# Patient Record
Sex: Female | Born: 1945
Health system: Southern US, Community
[De-identification: ages and names within clinical notes are randomized; demographics above are authoritative.]

## PROBLEM LIST (undated history)

## (undated) DIAGNOSIS — E785 Hyperlipidemia, unspecified: Secondary | ICD-10-CM

## (undated) DIAGNOSIS — I639 Cerebral infarction, unspecified: Secondary | ICD-10-CM

## (undated) DIAGNOSIS — E119 Type 2 diabetes mellitus without complications: Secondary | ICD-10-CM

## (undated) DIAGNOSIS — I10 Essential (primary) hypertension: Secondary | ICD-10-CM

## (undated) DIAGNOSIS — F419 Anxiety disorder, unspecified: Secondary | ICD-10-CM

## (undated) HISTORY — DX: Essential (primary) hypertension: I10

## (undated) HISTORY — DX: Hyperlipidemia, unspecified: E78.5

## (undated) HISTORY — DX: Cerebral infarction, unspecified: I63.9

## (undated) HISTORY — DX: Anxiety disorder, unspecified: F41.9

## (undated) HISTORY — DX: Type 2 diabetes mellitus without complications: E11.9

## (undated) HISTORY — PX: CAROTID STENT: SHX1301

## (undated) HISTORY — PX: CAROTID ENDARTERECTOMY: SUR193

---

## 1999-01-16 ENCOUNTER — Other Ambulatory Visit: Admission: RE | Admit: 1999-01-16 | Discharge: 1999-01-16 | Payer: Self-pay

## 2000-08-17 ENCOUNTER — Other Ambulatory Visit: Admission: RE | Admit: 2000-08-17 | Discharge: 2000-08-17 | Payer: Self-pay | Admitting: Family Medicine

## 2011-11-03 ENCOUNTER — Other Ambulatory Visit: Payer: Self-pay | Admitting: Geriatric Medicine

## 2011-11-03 DIAGNOSIS — R109 Unspecified abdominal pain: Secondary | ICD-10-CM

## 2011-11-05 ENCOUNTER — Other Ambulatory Visit: Payer: Self-pay

## 2013-01-26 ENCOUNTER — Encounter: Payer: Self-pay | Admitting: Nurse Practitioner

## 2013-01-26 ENCOUNTER — Ambulatory Visit (INDEPENDENT_AMBULATORY_CARE_PROVIDER_SITE_OTHER): Payer: BC Managed Care – PPO | Admitting: Nurse Practitioner

## 2013-01-26 VITALS — BP 122/84 | HR 89 | Temp 98.2°F | Ht 61.5 in | Wt 165.0 lb

## 2013-01-26 DIAGNOSIS — IMO0002 Reserved for concepts with insufficient information to code with codable children: Secondary | ICD-10-CM

## 2013-01-26 DIAGNOSIS — E119 Type 2 diabetes mellitus without complications: Secondary | ICD-10-CM

## 2013-01-26 DIAGNOSIS — I10 Essential (primary) hypertension: Secondary | ICD-10-CM | POA: Insufficient documentation

## 2013-01-26 DIAGNOSIS — E1165 Type 2 diabetes mellitus with hyperglycemia: Secondary | ICD-10-CM

## 2013-01-26 DIAGNOSIS — F411 Generalized anxiety disorder: Secondary | ICD-10-CM

## 2013-01-26 DIAGNOSIS — E785 Hyperlipidemia, unspecified: Secondary | ICD-10-CM

## 2013-01-26 DIAGNOSIS — E1169 Type 2 diabetes mellitus with other specified complication: Secondary | ICD-10-CM | POA: Insufficient documentation

## 2013-01-26 LAB — HEPATIC FUNCTION PANEL
AST: 17 U/L (ref 0–37)
Albumin: 4.3 g/dL (ref 3.5–5.2)
Alkaline Phosphatase: 90 U/L (ref 39–117)
Total Protein: 6.5 g/dL (ref 6.0–8.3)

## 2013-01-26 LAB — POCT GLYCOSYLATED HEMOGLOBIN (HGB A1C): Hemoglobin A1C: 8.3

## 2013-01-26 LAB — BASIC METABOLIC PANEL WITH GFR
Calcium: 9.9 mg/dL (ref 8.4–10.5)
GFR, Est African American: 80 mL/min
Glucose, Bld: 123 mg/dL — ABNORMAL HIGH (ref 70–99)
Sodium: 140 mEq/L (ref 135–145)

## 2013-01-26 MED ORDER — ALPRAZOLAM 0.5 MG PO TABS: 0.5000 mg | ORAL_TABLET | Freq: Two times a day (BID) | ORAL | Status: DC

## 2013-01-26 MED ORDER — LISINOPRIL-HYDROCHLOROTHIAZIDE 20-25 MG PO TABS: 1.0000 | ORAL_TABLET | Freq: Every day | ORAL | Status: DC

## 2013-01-26 NOTE — Progress Notes (Signed)
Subjective:    Patient ID: Kylie Ward, female    DOB: [birth year], 67 y.o.   MRN: [id number]  Diabetes She presents for her follow-up diabetic visit. She has type 2 diabetes mellitus. No MedicAlert identification noted. The initial diagnosis of diabetes was made 30 years ago. Her disease course has been fluctuating. There are no hypoglycemic associated symptoms. Pertinent negatives for hypoglycemia include no confusion, dizziness, mood changes or sweats. Associated symptoms include blurred vision and visual change ( needs new glasses). Pertinent negatives for diabetes include no chest pain, no foot ulcerations, no polydipsia and no polyphagia. There are no hypoglycemic complications. Diabetic complications include a CVA (light left side) and PVD (carotid stent left). Risk factors for coronary artery disease include dyslipidemia, hypertension, post-menopausal and obesity. Current diabetic treatment includes insulin injections and oral agent (monotherapy). She is compliant with treatment all of the time. She is currently taking insulin at bedtime and pre-breakfast (Patient takes [id number] at night and apidra sliding scale iwith meals). Insulin injections are given by patient. Rotation sites for injection include the abdominal wall and arms. Her weight is increasing rapidly (sinc patient stopped smoking). She is following a diabetic diet. When asked about meal planning, she reported none. She has not had a previous visit with a dietician. She rarely participates in exercise. Her home blood glucose trend is fluctuating dramatically. Her breakfast blood glucose is taken between 8-9 am. Her breakfast blood glucose range is generally 140-180 mg/dl. Her lunch blood glucose is taken between 12-1 pm. Her lunch blood glucose range is generally 140-180 mg/dl. Her dinner blood glucose is taken between 6-7 pm. Her dinner blood glucose range is generally 180-200 mg/dl. Her highest blood glucose is >200 mg/dl. Her  overall blood glucose range is 180-200 mg/dl. An ACE inhibitor/angiotensin II receptor blocker is being taken. She does not see a podiatrist.Eye exam is current (greater than 3 years).  Hypertension This is a chronic problem. The current episode started more than 1 year ago. The problem is unchanged. The problem is controlled. Associated symptoms include blurred vision and peripheral edema (some). Pertinent negatives include no chest pain, malaise/fatigue, shortness of breath or sweats. There are no associated agents to hypertension. Risk factors for coronary artery disease include diabetes mellitus, dyslipidemia, obesity, sedentary lifestyle, post-menopausal state and smoking/tobacco exposure. Past treatments include ACE inhibitors. The current treatment provides moderate improvement. Compliance problems include diet and exercise.  Hypertensive end-organ damage includes CVA (light left side) and PVD (carotid stent left).  Hyperlipidemia This is a chronic problem. The current episode started more than 1 year ago. The problem is controlled. Recent lipid tests were reviewed and are high. Exacerbating diseases include diabetes and obesity. There are no known factors aggravating her hyperlipidemia. Pertinent negatives include no chest pain, focal weakness, myalgias or shortness of breath. Current antihyperlipidemic treatment includes statins. The current treatment provides mild improvement of lipids. Compliance problems include medication side effects, adherence to diet and adherence to exercise.  Risk factors for coronary artery disease include diabetes mellitus, hypertension, post-menopausal and obesity.      Review of Systems  Constitutional: Negative.  Negative for malaise/fatigue.  HENT: Negative.   Eyes: Positive for blurred vision.  Respiratory: Negative.  Negative for shortness of breath.   Cardiovascular: Negative.  Negative for chest pain.  Gastrointestinal: Negative.   Endocrine: Negative.   Negative for polydipsia and polyphagia.  Genitourinary: Negative.   Musculoskeletal: Negative.  Negative for myalgias.  Neurological: Positive for numbness. Negative for dizziness and focal weakness.  Psychiatric/Behavioral: Negative for confusion.       Objective:   Physical Exam  Constitutional: She is oriented to person, place, and time. She appears well-developed and well-nourished.  HENT:  Head: Normocephalic.  Eyes: Pupils are equal, round, and reactive to light.  Neck: Normal range of motion.  Cardiovascular: Normal rate, regular rhythm and normal heart sounds.   No murmur heard. Pulmonary/Chest: Effort normal and breath sounds normal.  Abdominal: Soft. Bowel sounds are normal. There is no tenderness. There is no rebound.  Musculoskeletal: Normal range of motion. She exhibits no edema.  Neurological: She is alert and oriented to person, place, and time.  Skin: Skin is warm and dry.   BP 159/78  Pulse 89  Temp(Src) 98.2 F (36.8 C) (Oral)  Ht 5' 1.5" (1.562 m)  Wt 165 lb (74.844 kg)  BMI 30.68 kg/m2  LMP 01/27/1983  Results for orders placed in visit on 01/26/13  POCT GLYCOSYLATED HEMOGLOBIN (HGB A1C)      Result Value Range   Hemoglobin A1C 8.3           Assessment & Plan:  1. Diabetes Must check blood sugsr before each meal and give Sliding Scale apidra as indicated. Watch carbs in diet Exercise Keep diary of blood sugars - POCT glycosylated hemoglobin (Hb A1C) - BASIC METABOLIC PANEL WITH GFR - NMR Lipoprofile with Lipids - Hepatic function panel  2. Hyperlipidemia LDL goal < 100 Watch fats Continue current meds  3. Essential hypertension, benign Low NA+ in diet* - lisinopril-hydrochlorothiazide (PRINZIDE,ZESTORETIC) 20-25 MG per tablet; Take 1 tablet by mouth daily.  Dispense: 30 tablet; Refill: 1  5. GAD (generalized anxiety disorder)   ALPRAZolam (XANAX) 0.5 MG tablet; Take 1 tablet (0.5 mg total) by mouth 2 (two) times daily.  Dispense: 60  tablet; Refill: 0 Kylie Daphine Deutscher, FNP

## 2013-01-26 NOTE — Patient Instructions (Signed)
1. Diabetes Must check blood sugsr before each meal and give Sliding Scale apidra as indicated. Watch carbs in diet Exercise Keep diary of blood sugars - POCT glycosylated hemoglobin (Hb A1C) - BASIC METABOLIC PANEL WITH GFR - NMR Lipoprofile with Lipids - Hepatic function panel  2. Hyperlipidemia LDL goal < 100 Watch fats Continue current meds  3. Essential hypertension, benign Low NA+ in diet* - lisinopril-hydrochlorothiazide (PRINZIDE,ZESTORETIC) 20-25 MG per tablet; Take 1 tablet by mouth daily.  Dispense: 30 tablet; Refill: 1  5. GAD (generalized anxiety disorder)   ALPRAZolam (XANAX) 0.5 MG tablet; Take 1 tablet (0.5 mg total) by mouth 2 (two) times daily.  Dispense: 60 tablet; Refill: 0 Kylie Daphine Deutscher, FNP Correction Insulin Your caregiver has decided you need insulin at home. You have been given a correctional scale (sliding scale) in case you need extra insulin when your blood sugar is too high (hyperglycemia). The following instructions will assist you in how to use that correctional scale.  WHAT IS A CORRECTIONAL SCALE (SLIDING SCALE)?  When you check your blood sugar, sometimes it will be higher than your caregiver wants it to be. You may need an extra dose of insulin to bring your blood sugar to your desired level (also known as your goal, target level, or normal level.) The correctional scale is prescribed by your caregiver based on your specific needs. Your correctional scale has 2 parts. The first shows you a blood sugar range. The second part tells you how much extra insulin to give yourself if your blood sugar falls within this range. You will not need an extra dose of insulin if your blood glucose is in the desired range. You should always give yourself the normal amount of insulin your caregiver ordered as well.  The most common time to check your blood sugar is before eating and at bedtime. Check with your caregiver so he or she can tell you what the best  times are for you.  WHY IS IT IMPORTANT TO KEEP YOUR BLOOD SUGAR LEVELS AT YOUR DESIRED LEVEL?  It helps to prevent long-term complications of diabetes, such as eye disease, kidney failure, and other serious complications. WHAT TYPE OF INSULIN WILL YOU USE?  To help bring down blood sugars that are too high, your caregiver has prescribed a short-acting or a rapid-acting insulin. An example of a short-acting insulin would be Regular. Remember, you may also have a longer-acting insulin.  WHAT DO I NEED TO DO?   Check your blood sugar with your home blood glucose meter as recommended by your caregiver.  Using your correctional scale, find the range your blood sugar lies in.  Look for the units of insulin that matches the blood sugar range. Give yourself the dose of correctional insulin your caregiver has prescribed. Always make sure you are using the right type of insulin.  Prior to the injection make sure you have food available that you can eat in the next 15 to 30 minutes.  If your correctional insulin is rapid acting, start eating your meal within 15 minutes after you have given yourself the insulin injection. If you wait longer than 15 minutes to eat, your blood sugar might get too low.  If your correctional insulin is short acting (Regular), start eating your meal within 30 minutes after you have given yourself the insulin injection. If you wait longer than 30 minutes to eat, your blood sugar might get too low. Symptoms of low blood sugar (hypoglycemia) may include feeling shaky or  weak, sweating a lot, not thinking straight, difficulty seeing, agitation, or crankiness. Check your blood sugar immediately and treat your results as directed by your caregiver.  Keep a log of your blood sugar results with the time you took the test and the amount of insulin that you injected. This information will help your caregiver manage your medications.  Note on your log anything that may affect  your blood sugars such as:  Changes in normal exercise or activity.  Changes in your normal schedule, such as staying up late, going on vacation, changing your diet, or holidays.  New medications. This includes all medications. Some medications, even those that do not require a prescription, may cause high blood sugars.  Illness or stress.  Changes in when you actually took your medication.  Changes in your meals, such as skipping a meal, a late meal, or dining out.  Eating things that may affect blood glucose, such as snacks, larger meal portions than normal, or drinks with sugar.  Ask your caregiver any questions you have. WHY DO I NEED A CORRECTIONAL SCALE (SLIDING SCALE) IF I HAVE NEVER BEEN DIAGNOSED WITH DIABETES?   Keeping your blood glucose in range is important for your overall health.  You may have been prescribed medications that cause your blood glucose to be higher than normal.  Your discharging caregiver can provide additional information as needed. WHEN SHOULD I SEEK MEDICAL CARE?  If you have experienced hypoglycemia that you are unable to treat with your usual routine.  You have questions about your care.  You have persistent hypoglycemia or hyperglycemia. Someone who lives with you should seek emergency medical care if you become unresponsive. MAKE SURE YOU:  Understand these instructions.  Will watch your condition.  Will get help right away if you are not doing well or get worse. Document Released: 03/11/2011 Document Revised: 01/10/2012 Document Reviewed: 03/11/2011 [facility] Patient Information 2013 [place], [place].

## 2013-01-30 ENCOUNTER — Telehealth: Payer: Self-pay | Admitting: *Deleted

## 2013-01-30 ENCOUNTER — Other Ambulatory Visit: Payer: Self-pay | Admitting: Nurse Practitioner

## 2013-01-30 LAB — NMR LIPOPROFILE WITH LIPIDS
HDL Particle Number: 32.1 umol/L (ref >=30.5)
LDL (calc): 89 mg/dL (ref <100)
LDL Particle Number: 2048 nmol/L — ABNORMAL HIGH (ref <1000)
LDL Size: 19.5 nm — ABNORMAL LOW (ref >20.5)
LP-IR Score: 73 — ABNORMAL HIGH (ref <=45)
Small LDL Particle Number: 1807 nmol/L — ABNORMAL HIGH (ref <=527)

## 2013-01-30 MED ORDER — EZETIMIBE 10 MG PO TABS: 10.0000 mg | ORAL_TABLET | Freq: Every day | ORAL | Status: DC

## 2013-01-30 NOTE — Telephone Encounter (Signed)
Message copied by Magdalene River on Tue Jan 30, 2013  3:54 PM ------      Message from: Bennie Pierini      Created: Tue Jan 30, 2013  1:29 PM       Diabetes out of control. Scheduled patient appointment with clinical pharmacist to discuss how to use sliding scale- Encouraged patient at appointment to check bS before each meal and dose insulin according to sliding scale but pt very hesitant.      Cholesterol not ood. Currently on Zocor which is not strong enough- Add zetia 10mg  QD- WIll send RX to pharmacy- Need to recheck labs in 3 months- Try to watch fats in diet as well as carbs!!! ------

## 2013-01-30 NOTE — Telephone Encounter (Signed)
Pt notified of labs and will sch appt with pharm

## 2013-02-13 ENCOUNTER — Ambulatory Visit (INDEPENDENT_AMBULATORY_CARE_PROVIDER_SITE_OTHER): Payer: BC Managed Care – PPO | Admitting: Pharmacist Clinician (PhC)/ Clinical Pharmacy Specialist

## 2013-02-13 DIAGNOSIS — E1169 Type 2 diabetes mellitus with other specified complication: Secondary | ICD-10-CM

## 2013-02-13 DIAGNOSIS — E1159 Type 2 diabetes mellitus with other circulatory complications: Secondary | ICD-10-CM

## 2013-02-13 DIAGNOSIS — E785 Hyperlipidemia, unspecified: Secondary | ICD-10-CM

## 2013-02-13 NOTE — Progress Notes (Signed)
Subjective:    Patient ID: Kylie Ward, female    DOB: [birth year], 67 y.o.   MRN: [id number]  HPI Patient has a long standing history of type 2 diabetes mellitus and hyperlipidemia.  Her recent A1Cs starting December 2013 were >10% and have come down to most recently being 8.3%.  Patient has a strong desire to lower her A1c to <7% and her husband is present at the visit and also has type 2 DM.    Review of Systems  Constitutional: Positive for appetite change and fatigue. Negative for fever, chills, diaphoresis, activity change and unexpected weight change.  HENT: Negative.   Eyes: Negative.   Respiratory: Negative.   Cardiovascular: Negative.   Gastrointestinal: Negative.   Endocrine: Negative.   Genitourinary: Negative.   Neurological: Negative.   Hematological: Negative.   Psychiatric/Behavioral: Negative.        Objective:   Physical Exam  Constitutional: She is oriented to person, place, and time. She appears well-developed and well-nourished. No distress.  Cardiovascular: Normal rate, regular rhythm, normal heart sounds and intact distal pulses.  Exam reveals no gallop and no friction rub.   No murmur heard. Neurological: She is alert and oriented to person, place, and time.  Skin: Skin is warm and dry. She is not diaphoretic.  Psychiatric: She has a normal mood and affect. Her behavior is normal. Judgment and thought content normal.          Assessment & Plan:   Diabetes Flow Sheet:  Visit 1  Chief Complaint:  No chief complaint on file.    Exam Regularity:  RRR  Edema:  neg Polyuria:  neg Polydipsia:  neg Polyphagia:  neg  BMI:  There is no weight on file to calculate BMI.   Weight changes:  Weight gain General Appearance:  alert, oriented, no acute distress Mood/Affect:  normal  Low fat/carbohydrate diet?  No Nicotine Abuse?  No Medication Compliance?  Yes Exercise?  No Alcohol Abuse?  No  Glucose Readings Two fasting blood glucose reading >  126mg /dL:  O9G  and  >2%  Lab Results  Component Value Date   HGBA1C 8.3 01/26/2013    Lab Results  Component Value Date   LDLCALC 89 01/26/2013   TRIG 399* 01/26/2013     Medication Checklist: ACE Inhibitor/ARB?  Yes Lipid Lowering Agent?  Yes Aspirin?  Yes Oral Hypoglycemic Agent(s)?  Yes  Assessment: 1.  type 2 Diabetes.    2.  Blood Pressure Control.  At goal 3.  Lipid Control.  TG elevations secondary to poor diabetes control  Recommendations: 1.  1800 calorie, carbohydrate counting diet.  Patient is counseled extensively on carbohydrate counting, serving sizes, saturated fat intake and meal planning.  Patient is instructed to eat 3 meals a day and 3 small snacks.  Patient will supplement snacks based on physical activity. 2.  30 minutes of physical activity.  Patient is counseled to always carry glucose tablets, lifesavers, hard candies, etc., while exercising in case of hypoglycemic event. 3.  Patient is counseled on pathophysiology of diabetes and the risk of long-term complications.  Fasting blood glucose goals are 80-120mg /dL.  Post-prandial goals are < 140.  A1C goals < 6.5%. 4.  LDL goal of < 100, HDL > 40 and TG < 150; BP goal < 130/80 5.  Patient is counseled on proper use of glucometer and lancing device.  Patient is informed how often to test and how to respond to unsuitable results. 6.  Medication recommendations at  this time are as follows:  Start back on Victoza 0.6 daily with titration to 1.2.  Patient can cut back on lantus to 45 units once she has started Victoza, diet, and exercise program.  Husband will check on [employer] insurance coverage.  Time spent counseling patient:  21  Physician time spent with patient:  15  Referring provider:  Christell Constant    PharmD:  [facility] Pharmacist

## 2013-02-20 ENCOUNTER — Other Ambulatory Visit: Payer: Self-pay | Admitting: *Deleted

## 2013-02-20 ENCOUNTER — Other Ambulatory Visit: Payer: Self-pay

## 2013-02-20 MED ORDER — INSULIN GLARGINE 100 UNIT/ML ~~LOC~~ SOLN: 40.0000 [IU] | Freq: Two times a day (BID) | SUBCUTANEOUS | Status: DC

## 2013-02-20 NOTE — Telephone Encounter (Signed)
Lantus rx ready, pt aware at front.

## 2013-02-20 NOTE — Telephone Encounter (Signed)
Last seen 12/13  Last glucose level 07/14/12   If approved print for mail order and call patient to pick up

## 2013-02-22 ENCOUNTER — Other Ambulatory Visit: Payer: Self-pay | Admitting: *Deleted

## 2013-02-22 DIAGNOSIS — F411 Generalized anxiety disorder: Secondary | ICD-10-CM

## 2013-02-22 MED ORDER — ALPRAZOLAM 0.5 MG PO TABS: 0.5000 mg | ORAL_TABLET | Freq: Two times a day (BID) | ORAL | Status: DC

## 2013-02-22 NOTE — Telephone Encounter (Signed)
Please call in RX fro Xanax 0 refills

## 2013-02-22 NOTE — Telephone Encounter (Signed)
Last filled 01/26/13, last seen 10/30/12 Call into [facility] if approved

## 2013-02-22 NOTE — Telephone Encounter (Signed)
Called into pharmacy

## 2013-02-22 NOTE — Telephone Encounter (Signed)
Called into the pharmacy.

## 2013-03-22 ENCOUNTER — Other Ambulatory Visit: Payer: Self-pay | Admitting: Nurse Practitioner

## 2013-05-11 ENCOUNTER — Ambulatory Visit (INDEPENDENT_AMBULATORY_CARE_PROVIDER_SITE_OTHER): Payer: Medicare Other | Admitting: Nurse Practitioner

## 2013-05-11 ENCOUNTER — Encounter: Payer: Self-pay | Admitting: Nurse Practitioner

## 2013-05-11 VITALS — BP 102/53 | HR 80 | Temp 98.4°F | Ht 61.5 in | Wt 162.0 lb

## 2013-05-11 DIAGNOSIS — I10 Essential (primary) hypertension: Secondary | ICD-10-CM

## 2013-05-11 DIAGNOSIS — E1165 Type 2 diabetes mellitus with hyperglycemia: Secondary | ICD-10-CM

## 2013-05-11 DIAGNOSIS — E785 Hyperlipidemia, unspecified: Secondary | ICD-10-CM

## 2013-05-11 DIAGNOSIS — R609 Edema, unspecified: Secondary | ICD-10-CM

## 2013-05-11 MED ORDER — FUROSEMIDE 20 MG PO TABS: 20.0000 mg | ORAL_TABLET | Freq: Every day | ORAL | Status: DC

## 2013-05-11 MED ORDER — INSULIN GLARGINE 100 UNIT/ML ~~LOC~~ SOLN: 60.0000 [IU] | Freq: Two times a day (BID) | SUBCUTANEOUS | Status: DC

## 2013-05-11 MED ORDER — METFORMIN HCL ER 750 MG PO TB24: 750.0000 mg | ORAL_TABLET | Freq: Every day | ORAL | Status: DC

## 2013-05-11 MED ORDER — LISINOPRIL 20 MG PO TABS: 20.0000 mg | ORAL_TABLET | Freq: Every day | ORAL | Status: DC

## 2013-05-11 NOTE — Progress Notes (Signed)
Subjective:    Patient ID: SABRA SESSLER, female    DOB: May 05, 1946, 67 y.o.   MRN: [id number]  Hypertension This is a chronic problem. The current episode started more than 1 year ago. The problem is unchanged. The problem is controlled. Associated symptoms include peripheral edema. Pertinent negatives include no blurred vision, chest pain, headaches, malaise/fatigue, orthopnea, palpitations, shortness of breath or sweats. There are no associated agents to hypertension. Risk factors for coronary artery disease include diabetes mellitus, dyslipidemia, obesity and post-menopausal state. Past treatments include ACE inhibitors and diuretics. The current treatment provides significant improvement. Compliance problems include diet and exercise.   Hyperlipidemia This is a chronic problem. The current episode started more than 1 year ago. The problem is controlled. Recent lipid tests were reviewed and are normal. Exacerbating diseases include diabetes and obesity. Pertinent negatives include no chest pain or shortness of breath. Current antihyperlipidemic treatment includes ezetimibe and statins. The current treatment provides moderate improvement of lipids. Compliance problems include adherence to diet and adherence to exercise.  Risk factors for coronary artery disease include diabetes mellitus, hypertension, obesity and post-menopausal.  Diabetes She presents for her follow-up diabetic visit. She has type 2 diabetes mellitus. No MedicAlert identification noted. The initial diagnosis of diabetes was made 18 years ago. Her disease course has been stable. Pertinent negatives for hypoglycemia include no headaches or sweats. Pertinent negatives for diabetes include no blurred vision and no chest pain. There are no hypoglycemic complications. Symptoms are stable. There are no diabetic complications. Risk factors for coronary artery disease include dyslipidemia, hypertension, obesity and post-menopausal. She is  compliant with treatment most of the time. Her weight is stable. She is following a diabetic diet. When asked about meal planning, she reported none. She has not had a previous visit with a dietician. She rarely participates in exercise. There is no change in her home blood glucose trend. Her breakfast blood glucose is taken between 8-9 am. Her breakfast blood glucose range is generally >200 mg/dl. Her overall blood glucose range is 180-200 mg/dl. An ACE inhibitor/angiotensin II receptor blocker is being taken. She does not see a podiatrist.Eye exam is not current (patient to make appointment).   * patient c/o swelling throughout day bil hands and feet    Review of Systems  Constitutional: Negative for malaise/fatigue.  Eyes: Negative for blurred vision.  Respiratory: Negative for shortness of breath.   Cardiovascular: Negative for chest pain, palpitations and orthopnea.  Neurological: Negative for headaches.  All other systems reviewed and are negative.       Objective:   Physical Exam  Constitutional: She is oriented to person, place, and time. She appears well-developed and well-nourished.  HENT:  Nose: Nose normal.  Mouth/Throat: Oropharynx is clear and moist.  Eyes: EOM are normal.  Neck: Trachea normal, normal range of motion and full passive range of motion without pain. Neck supple. No JVD present. Carotid bruit is not present. No thyromegaly present.  Cardiovascular: Normal rate, regular rhythm, normal heart sounds and intact distal pulses.  Exam reveals no gallop and no friction rub.   No murmur heard. Pulmonary/Chest: Effort normal and breath sounds normal.  Abdominal: Soft. Bowel sounds are normal. She exhibits no distension and no mass. There is no tenderness.  Musculoskeletal: Normal range of motion.  Lymphadenopathy:    She has no cervical adenopathy.  Neurological: She is alert and oriented to person, place, and time. She has normal reflexes.  Skin: Skin is warm and  dry.  Psychiatric:  She has a normal mood and affect. Her behavior is normal. Judgment and thought content normal.   BP 102/53  Pulse 80  Temp(Src) 98.4 F (36.9 C) (Oral)  Ht 5' 1.5" (1.562 m)  Wt 162 lb (73.483 kg)  BMI 30.12 kg/m2  LMP 01/27/1983  Results for orders placed in visit on 05/11/13  POCT GLYCOSYLATED HEMOGLOBIN (HGB A1C)      Result Value Range   Hemoglobin A1C 9.2 %           Assessment & Plan:  1. Hyperlipidemia LDL goal < 100 Low fat diet an dexercsie - NMR Lipoprofile with Lipids  2. Type II or unspecified type diabetes mellitus with unspecified complication, uncontrolled Added glucophage Increase lantus from 50 u to 55u for 3 nights and then to 60 u Diary of blood sugars Follow up in 1 month - POCT glycosylated hemoglobin (Hb A1C) - metFORMIN (GLUCOPHAGE XR) 750 MG 24 hr tablet; Take 1 tablet (750 mg total) by mouth daily with breakfast.  Dispense: 30 tablet; Refill: 3 - insulin glargine (LANTUS) 100 UNIT/ML injection; Inject 0.6 mLs (60 Units total) into the skin 2 (two) times daily.  Dispense: 8 mL; Refill: 0  3. Essential hypertension, benign Low NA+ diet Changed zestoretic to plain lisinopril so could ad lasix for edema - COMPLETE METABOLIC PANEL WITH GFR - lisinopril (PRINIVIL,ZESTRIL) 20 MG tablet; Take 1 tablet (20 mg total) by mouth daily.  Dispense: 90 tablet; Refill: 3  4. Peripheral edema Elevate legs when sitting - furosemide (LASIX) 20 MG tablet; Take 1 tablet (20 mg total) by mouth daily.  Dispense: 30 tablet; Refill: 3  [name], FNP

## 2013-05-11 NOTE — Patient Instructions (Signed)

## 2013-05-12 ENCOUNTER — Telehealth: Payer: Self-pay | Admitting: Nurse Practitioner

## 2013-05-12 DIAGNOSIS — F411 Generalized anxiety disorder: Secondary | ICD-10-CM

## 2013-05-12 LAB — COMPLETE METABOLIC PANEL WITH GFR
BUN: 18 mg/dL (ref 6–23)
CO2: 29 mEq/L (ref 19–32)
GFR, Est African American: >89 mL/min
GFR, Est Non African American: 81 mL/min
Glucose, Bld: 90 mg/dL (ref 70–99)
Sodium: 139 mEq/L (ref 135–145)
Total Bilirubin: 0.5 mg/dL (ref 0.3–1.2)
Total Protein: 6.8 g/dL (ref 6.0–8.3)

## 2013-05-12 MED ORDER — ALPRAZOLAM 0.5 MG PO TABS: 0.5000 mg | ORAL_TABLET | Freq: Two times a day (BID) | ORAL | Status: DC

## 2013-05-12 NOTE — Telephone Encounter (Signed)
Please call in xanax rx with 1 refill 

## 2013-05-12 NOTE — Telephone Encounter (Signed)
Patient aware rx called into pharmacy. 

## 2013-05-14 LAB — NMR LIPOPROFILE WITH LIPIDS
HDL Size: 9 nm — ABNORMAL LOW (ref >=9.2)
HDL-C: 41 mg/dL (ref >=40)
LDL Particle Number: 2129 nmol/L — ABNORMAL HIGH (ref <1000)
LDL Size: 19.6 nm — ABNORMAL LOW (ref >20.5)
Large HDL-P: 3.1 umol/L — ABNORMAL LOW (ref >=4.8)
Large VLDL-P: 6.2 nmol/L — ABNORMAL HIGH (ref <=2.7)

## 2013-05-16 ENCOUNTER — Other Ambulatory Visit: Payer: Self-pay | Admitting: Nurse Practitioner

## 2013-05-16 MED ORDER — ROSUVASTATIN CALCIUM 10 MG PO TABS: 10.0000 mg | ORAL_TABLET | Freq: Every day | ORAL | Status: DC

## 2013-06-18 ENCOUNTER — Ambulatory Visit: Payer: Medicare Other | Admitting: Nurse Practitioner

## 2013-07-20 ENCOUNTER — Encounter: Payer: Self-pay | Admitting: Nurse Practitioner

## 2013-07-20 ENCOUNTER — Ambulatory Visit (INDEPENDENT_AMBULATORY_CARE_PROVIDER_SITE_OTHER): Payer: Medicare Other | Admitting: Nurse Practitioner

## 2013-07-20 VITALS — BP 135/75 | HR 88 | Temp 97.6°F | Ht 61.5 in | Wt 159.0 lb

## 2013-07-20 DIAGNOSIS — E119 Type 2 diabetes mellitus without complications: Secondary | ICD-10-CM

## 2013-07-20 DIAGNOSIS — F411 Generalized anxiety disorder: Secondary | ICD-10-CM

## 2013-07-20 DIAGNOSIS — E785 Hyperlipidemia, unspecified: Secondary | ICD-10-CM

## 2013-07-20 DIAGNOSIS — M549 Dorsalgia, unspecified: Secondary | ICD-10-CM

## 2013-07-20 DIAGNOSIS — I10 Essential (primary) hypertension: Secondary | ICD-10-CM

## 2013-07-20 LAB — POCT URINALYSIS DIPSTICK
Blood, UA: NEGATIVE
Nitrite, UA: NEGATIVE
Protein, UA: NEGATIVE
Urobilinogen, UA: NEGATIVE
pH, UA: 5

## 2013-07-20 LAB — POCT UA - MICROSCOPIC ONLY
Crystals, Ur, HPF, POC: NEGATIVE
Yeast, UA: NEGATIVE

## 2013-07-20 MED ORDER — INSULIN GLULISINE 100 UNIT/ML SOLOSTAR PEN: 12.0000 [IU] | PEN_INJECTOR | Freq: Three times a day (TID) | SUBCUTANEOUS | Status: DC

## 2013-07-20 MED ORDER — INSULIN GLARGINE 100 UNIT/ML SOLOSTAR PEN: 70.0000 [IU] | PEN_INJECTOR | Freq: Every evening | SUBCUTANEOUS | Status: DC | PRN

## 2013-07-20 MED ORDER — ALPRAZOLAM 0.5 MG PO TABS: 0.5000 mg | ORAL_TABLET | Freq: Two times a day (BID) | ORAL | Status: DC

## 2013-07-20 MED ORDER — ROSUVASTATIN CALCIUM 10 MG PO TABS: 10.0000 mg | ORAL_TABLET | Freq: Every day | ORAL | Status: DC

## 2013-07-20 NOTE — Patient Instructions (Addendum)

## 2013-07-20 NOTE — Progress Notes (Signed)
Subjective:    Patient ID: Kylie Ward, female    DOB: Mar 01, 1946, 67 y.o.   MRN: [id number]  Back Pain This is a new problem. The current episode started in the past 7 days (For the last three days). The problem occurs constantly. The problem is unchanged. The pain is present in the lumbar spine. The quality of the pain is described as aching. The pain does not radiate. The pain is at a severity of 8/10. The pain is moderate. Associated symptoms include numbness. Pertinent negatives include no chest pain. She has tried NSAIDs for the symptoms. The treatment provided moderate relief.  Diabetes She presents for her follow-up diabetic visit. She has type 2 diabetes mellitus. No MedicAlert identification noted. The initial diagnosis of diabetes was made 30 years ago. Her disease course has been fluctuating. Pertinent negatives for hypoglycemia include no confusion, dizziness, mood changes, nervousness/anxiousness or sweats. Associated symptoms include blurred vision, fatigue, foot paresthesias and visual change ( needs new glasses). Pertinent negatives for diabetes include no chest pain, no foot ulcerations, no polydipsia and no polyphagia. There are no hypoglycemic complications. Diabetic complications include retinopathy. CVA: light left side. PVD: carotid stent left. Risk factors for coronary artery disease include dyslipidemia, hypertension, post-menopausal and obesity. Current diabetic treatment includes insulin injections and oral agent (monotherapy). She is compliant with treatment all of the time. She is currently taking insulin at bedtime and pre-breakfast (Patient takes lanutus65u at night and apidra sliding scale iwith meals). Insulin injections are given by patient. Rotation sites for injection include the abdominal wall and arms. Her weight is increasing rapidly (sinc patient stopped smoking). She is following a diabetic diet. When asked about meal planning, she reported none. She has not had  a previous visit with a dietician. She rarely participates in exercise. Her home blood glucose trend is fluctuating dramatically. Her breakfast blood glucose is taken between 8-9 am. Her breakfast blood glucose range is generally >200 mg/dl. Her lunch blood glucose is taken between 12-1 pm. Her lunch blood glucose range is generally >200 mg/dl. Her dinner blood glucose is taken between 6-7 pm. Her dinner blood glucose range is generally >200 mg/dl. Her highest blood glucose is >200 mg/dl. Her overall blood glucose range is >200 mg/dl. An ACE inhibitor/angiotensin II receptor blocker is being taken. She does not see a podiatrist.Eye exam is current (Pt states she is "due"-Encouraged to make appointment).  Hypertension This is a chronic problem. The current episode started more than 1 year ago. The problem is unchanged. The problem is controlled. Associated symptoms include anxiety, blurred vision and peripheral edema (some). Pertinent negatives include no chest pain, malaise/fatigue, shortness of breath or sweats. There are no associated agents to hypertension. Risk factors for coronary artery disease include diabetes mellitus, dyslipidemia, obesity, sedentary lifestyle, post-menopausal state and smoking/tobacco exposure. Past treatments include ACE inhibitors and diuretics. The current treatment provides moderate improvement. Compliance problems include diet and exercise.  Hypertensive end-organ damage includes retinopathy. There is no history of a thyroid problem. CVA: light left side. PVD: carotid stent left.  Hyperlipidemia This is a chronic problem. The current episode started more than 1 year ago. The problem is controlled. Recent lipid tests were reviewed and are high. Exacerbating diseases include diabetes and obesity. There are no known factors aggravating her hyperlipidemia. Pertinent negatives include no chest pain, focal weakness, myalgias or shortness of breath. Current antihyperlipidemic treatment  includes statins. The current treatment provides mild improvement of lipids. Compliance problems include medication side effects, adherence  to diet and adherence to exercise.  Risk factors for coronary artery disease include diabetes mellitus, hypertension, post-menopausal and obesity.  Anxiety Presents for follow-up visit. Patient reports no chest pain, confusion, dizziness, insomnia, irritability, nervous/anxious behavior, panic or shortness of breath. Symptoms occur rarely. The quality of sleep is fair. Nighttime awakenings: one to two.   Past treatments include benzodiazephines. The treatment provided moderate relief. Compliance with prior treatments has been good.      Review of Systems  Constitutional: Positive for fatigue. Negative for malaise/fatigue and irritability.  HENT: Negative.   Eyes: Positive for blurred vision.  Respiratory: Negative.  Negative for shortness of breath.   Cardiovascular: Negative.  Negative for chest pain.  Gastrointestinal: Negative.   Endocrine: Negative.  Negative for polydipsia and polyphagia.  Genitourinary: Negative.   Musculoskeletal: Positive for back pain. Negative for myalgias.  Neurological: Positive for numbness. Negative for dizziness and focal weakness.  Psychiatric/Behavioral: Negative for confusion. The patient is not nervous/anxious and does not have insomnia.   All other systems reviewed and are negative.       Objective:   Physical Exam  Constitutional: She is oriented to person, place, and time. She appears well-developed and well-nourished.  HENT:  Head: Normocephalic.  Eyes: Pupils are equal, round, and reactive to light.  Neck: Normal range of motion.  Cardiovascular: Normal rate, regular rhythm, normal heart sounds and intact distal pulses.   No murmur heard. Pulmonary/Chest: Effort normal and breath sounds normal.  Abdominal: Soft. Bowel sounds are normal. There is no tenderness. There is no rebound.  Musculoskeletal:  Normal range of motion. She exhibits no edema.  Neurological: She is alert and oriented to person, place, and time.  Skin: Skin is warm and dry.  Psychiatric: She has a normal mood and affect. Her behavior is normal. Judgment and thought content normal.   BP 135/75  Pulse 88  Temp(Src) 97.6 F (36.4 C) (Oral)  Ht 5' 1.5" (1.562 m)  Wt 159 lb (72.122 kg)  BMI 29.56 kg/m2  LMP 01/27/1983 Results for orders placed in visit on 07/20/13  POCT UA - MICROSCOPIC ONLY      Result Value Range   WBC, Ur, HPF, POC 5-15     RBC, urine, microscopic 10-20     Bacteria, U Microscopic many     Mucus, UA mod     Epithelial cells, urine per micros few     Crystals, Ur, HPF, POC neg     Casts, Ur, LPF, POC occ     Yeast, UA neg    POCT URINALYSIS DIPSTICK      Result Value Range   Color, UA yellow     Clarity, UA clear     Glucose, UA neg     Bilirubin, UA neg     Ketones, UA neg     Spec Grav, UA 1.020     Blood, UA neg     pH, UA 5.0     Protein, UA neg     Urobilinogen, UA negative     Nitrite, UA neg     Leukocytes, UA Negative    POCT GLYCOSYLATED HEMOGLOBIN (HGB A1C)      Result Value Range   Hemoglobin A1C 8.9%            Assessment & Plan:  1. Diabetes Must check blood sugsr before each meal and give Sliding Scale apidra as indicated. Watch carbs in diet Exercise Keep diary of blood sugars Patient was not take lantus  as ordered so we increased to 65u X3d then to 70UQhs Continue apidra sliding scale- patiet wlll bring sliding scale into to document in chart - POCT glycosylated hemoglobin (Hb A1C) - BASIC METABOLIC PANEL WITH GFR - NMR Lipoprofile with Lipids - Hepatic function panel  2. Hyperlipidemia LDL goal < 100 Watch fats Continue current meds  3. Essential hypertension, benign Low NA+ in diet* - lisinopril-hydrochlorothiazide (PRINZIDE,ZESTORETIC) 20-25 MG per tablet; Take 1 tablet by mouth daily.  Dispense: 30 tablet; Refill: 1  5. GAD (generalized  anxiety disorder)   ALPRAZolam (XANAX) 0.5 MG tablet; Take 1 tablet (0.5 mg total) by mouth 2 (two) times daily.  Dispense: 60 tablet; Refill: 0  Health Maintenance disscussed [name], FNP

## 2013-07-21 LAB — NMR, LIPOPROFILE
Cholesterol: 169 mg/dL (ref <200)
HDL Particle Number: 37.6 umol/L (ref >=30.5)
LDL Particle Number: 1016 nmol/L — ABNORMAL HIGH (ref <1000)
LDL Size: 19.9 nm — ABNORMAL LOW (ref >20.5)
LP-IR Score: 72 — ABNORMAL HIGH (ref <=45)

## 2013-07-21 LAB — CMP14+EGFR
Albumin: 4.3 g/dL (ref 3.6–4.8)
Alkaline Phosphatase: 91 IU/L (ref 39–117)
BUN/Creatinine Ratio: 24 (ref 11–26)
BUN: 20 mg/dL (ref 8–27)
Chloride: 100 mmol/L (ref 97–108)
GFR calc Af Amer: 87 mL/min/{1.73_m2} (ref >59)
Total Bilirubin: 0.3 mg/dL (ref 0.0–1.2)

## 2013-08-28 ENCOUNTER — Telehealth: Payer: Self-pay | Admitting: *Deleted

## 2013-08-28 DIAGNOSIS — E119 Type 2 diabetes mellitus without complications: Secondary | ICD-10-CM

## 2013-08-28 MED ORDER — INSULIN GLARGINE 100 UNIT/ML ~~LOC~~ SOLN: 70.0000 [IU] | Freq: Every day | SUBCUTANEOUS | Status: DC

## 2013-08-28 NOTE — Telephone Encounter (Signed)
We do not have any vials

## 2013-08-28 NOTE — Telephone Encounter (Signed)
lantus rx sent to pharmacy

## 2013-08-28 NOTE — Telephone Encounter (Signed)
Husband came in for refill on lantus. States instead of pens he would like insulin in vial. Please advise

## 2013-10-19 ENCOUNTER — Encounter: Payer: Self-pay | Admitting: Nurse Practitioner

## 2013-10-19 ENCOUNTER — Ambulatory Visit (INDEPENDENT_AMBULATORY_CARE_PROVIDER_SITE_OTHER): Payer: Medicare Other | Admitting: Nurse Practitioner

## 2013-10-19 ENCOUNTER — Encounter (INDEPENDENT_AMBULATORY_CARE_PROVIDER_SITE_OTHER): Payer: Self-pay

## 2013-10-19 VITALS — BP 141/77 | HR 83 | Temp 98.7°F | Ht 61.5 in | Wt 163.0 lb

## 2013-10-19 DIAGNOSIS — I10 Essential (primary) hypertension: Secondary | ICD-10-CM

## 2013-10-19 DIAGNOSIS — E1165 Type 2 diabetes mellitus with hyperglycemia: Secondary | ICD-10-CM

## 2013-10-19 DIAGNOSIS — F411 Generalized anxiety disorder: Secondary | ICD-10-CM

## 2013-10-19 DIAGNOSIS — E119 Type 2 diabetes mellitus without complications: Secondary | ICD-10-CM

## 2013-10-19 DIAGNOSIS — E785 Hyperlipidemia, unspecified: Secondary | ICD-10-CM

## 2013-10-19 DIAGNOSIS — Z23 Encounter for immunization: Secondary | ICD-10-CM

## 2013-10-19 LAB — POCT GLYCOSYLATED HEMOGLOBIN (HGB A1C): Hemoglobin A1C: 9.8

## 2013-10-19 MED ORDER — ROSUVASTATIN CALCIUM 10 MG PO TABS: 10.0000 mg | ORAL_TABLET | Freq: Every day | ORAL | Status: DC

## 2013-10-19 MED ORDER — ALPRAZOLAM 0.5 MG PO TABS: 0.5000 mg | ORAL_TABLET | Freq: Two times a day (BID) | ORAL | Status: DC

## 2013-10-19 MED ORDER — INSULIN GLULISINE 100 UNIT/ML SOLOSTAR PEN: 14.0000 [IU] | PEN_INJECTOR | Freq: Three times a day (TID) | SUBCUTANEOUS | Status: DC

## 2013-10-19 NOTE — Patient Instructions (Signed)
Diabetes and Foot Care Diabetes may cause you to have problems because of poor blood supply (circulation) to your feet and legs. This may cause the skin on your feet to become thinner, break easier, and heal more slowly. Your skin may become dry, and the skin may peel and crack. You may also have nerve damage in your legs and feet causing decreased feeling in them. You may not notice minor injuries to your feet that could lead to infections or more serious problems. Taking care of your feet is one of the most important things you can do for yourself.  HOME CARE INSTRUCTIONS  Wear shoes at all times, even in the house. Do not go barefoot. Bare feet are easily injured.  Check your feet daily for blisters, cuts, and redness. If you cannot see the bottom of your feet, use a mirror or ask someone for help.  Wash your feet with warm water (do not use hot water) and mild soap. Then pat your feet and the areas between your toes until they are completely dry. Do not soak your feet as this can dry your skin.  Apply a moisturizing lotion or petroleum jelly (that does not contain alcohol and is unscented) to the skin on your feet and to dry, brittle toenails. Do not apply lotion between your toes.  Trim your toenails straight across. Do not dig under them or around the cuticle. File the edges of your nails with an emery board or nail file.  Do not cut corns or calluses or try to remove them with medicine.  Wear clean socks or stockings every day. Make sure they are not too tight. Do not wear knee-high stockings since they may decrease blood flow to your legs.  Wear shoes that fit properly and have enough cushioning. To break in new shoes, wear them for just a few hours a day. This prevents you from injuring your feet. Always look in your shoes before you put them on to be sure there are no objects inside.  Do not cross your legs. This may decrease the blood flow to your feet.  If you find a minor scrape,  cut, or break in the skin on your feet, keep it and the skin around it clean and dry. These areas may be cleansed with mild soap and water. Do not cleanse the area with peroxide, alcohol, or iodine.  When you remove an adhesive bandage, be sure not to damage the skin around it.  If you have a wound, look at it several times a day to make sure it is healing.  Do not use heating pads or hot water bottles. They may burn your skin. If you have lost feeling in your feet or legs, you may not know it is happening until it is too late.  Make sure your health care provider performs a complete foot exam at least annually or more often if you have foot problems. Report any cuts, sores, or bruises to your health care provider immediately. SEEK MEDICAL CARE IF:   You have an injury that is not healing.  You have cuts or breaks in the skin.  You have an ingrown nail.  You notice redness on your legs or feet.  You feel burning or tingling in your legs or feet.  You have pain or cramps in your legs and feet.  Your legs or feet are numb.  Your feet always feel cold. SEEK IMMEDIATE MEDICAL CARE IF:   There is increasing redness,   swelling, or pain in or around a wound.  There is a red line that goes up your leg.  Pus is coming from a wound.  You develop a fever or as directed by your health care provider.  You notice a bad smell coming from an ulcer or wound. Document Released: 10/15/2000 Document Revised: 06/20/2013 Document Reviewed: 03/27/2013 [employer] Patient Information 2014 [employer].  

## 2013-10-19 NOTE — Progress Notes (Signed)
Subjective:    Patient ID: Kylie Ward, female    DOB: January 19, 1946, 67 y.o.   MRN: [id number]  Patient here today for follow up- she is doing - her only compliant is blood sugar are going up.  Diabetes She presents for her follow-up diabetic visit. She has type 2 diabetes mellitus. No MedicAlert identification noted. The initial diagnosis of diabetes was made 30 years ago. Her disease course has been fluctuating. There are no hypoglycemic associated symptoms. Pertinent negatives for hypoglycemia include no confusion, dizziness, mood changes or sweats. Associated symptoms include blurred vision and visual change ( needs new glasses). Pertinent negatives for diabetes include no chest pain, no foot ulcerations, no polydipsia and no polyphagia. There are no hypoglycemic complications. Diabetic complications include a CVA (light left side) and PVD (carotid stent left). Risk factors for coronary artery disease include dyslipidemia, hypertension, post-menopausal and obesity. Current diabetic treatment includes insulin injections and oral agent (monotherapy). She is compliant with treatment all of the time. She is currently taking insulin at bedtime and pre-breakfast (Patient takes lanutus65u at night and apidra sliding scale with meals averaging 10-12 units.). Insulin injections are given by patient. Rotation sites for injection include the abdominal wall and arms. Her weight is increasing rapidly (sinc patient stopped smoking). She is following a diabetic diet. When asked about meal planning, she reported none. She has not had a previous visit with a dietician. She rarely participates in exercise. Her home blood glucose trend is fluctuating dramatically. Her breakfast blood glucose is taken between 8-9 am. Her breakfast blood glucose range is generally 180-200 mg/dl. Her lunch blood glucose is taken between 12-1 pm. Her lunch blood glucose range is generally 180-200 mg/dl. Her dinner blood glucose is taken  between 6-7 pm. Her dinner blood glucose range is generally 180-200 mg/dl. Her highest blood glucose is >200 mg/dl. Her overall blood glucose range is >200 mg/dl. An ACE inhibitor/angiotensin II receptor blocker is being taken. She does not see a podiatrist.Eye exam is current (greater than 3 years).  Hypertension This is a chronic problem. The current episode started more than 1 year ago. The problem is unchanged. The problem is controlled. Associated symptoms include blurred vision and peripheral edema (some). Pertinent negatives include no chest pain, malaise/fatigue, shortness of breath or sweats. There are no associated agents to hypertension. Risk factors for coronary artery disease include diabetes mellitus, dyslipidemia, obesity, sedentary lifestyle, post-menopausal state and smoking/tobacco exposure. Past treatments include ACE inhibitors. The current treatment provides moderate improvement. Compliance problems include diet and exercise.  Hypertensive end-organ damage includes CVA (light left side) and PVD (carotid stent left).  Hyperlipidemia This is a chronic problem. The current episode started more than 1 year ago. The problem is controlled. Recent lipid tests were reviewed and are high. Exacerbating diseases include diabetes and obesity. There are no known factors aggravating her hyperlipidemia. Pertinent negatives include no chest pain, focal weakness, myalgias or shortness of breath. Current antihyperlipidemic treatment includes statins. The current treatment provides mild improvement of lipids. Compliance problems include medication side effects, adherence to diet and adherence to exercise.  Risk factors for coronary artery disease include diabetes mellitus, hypertension, post-menopausal and obesity.      Review of Systems  Constitutional: Negative.  Negative for malaise/fatigue.  HENT: Negative.   Eyes: Positive for blurred vision.  Respiratory: Negative.  Negative for shortness of  breath.   Cardiovascular: Negative.  Negative for chest pain.  Gastrointestinal: Negative.   Endocrine: Negative.  Negative for polydipsia and  polyphagia.  Genitourinary: Negative.   Musculoskeletal: Negative.  Negative for myalgias.  Neurological: Positive for numbness. Negative for dizziness and focal weakness.  Psychiatric/Behavioral: Negative for confusion.       Objective:   Physical Exam  Constitutional: She is oriented to person, place, and time. She appears well-developed and well-nourished.  HENT:  Head: Normocephalic.  Eyes: Pupils are equal, round, and reactive to light.  Neck: Normal range of motion.  Cardiovascular: Normal rate, regular rhythm and normal heart sounds.   No murmur heard. Pulmonary/Chest: Effort normal and breath sounds normal.  Abdominal: Soft. Bowel sounds are normal. There is no tenderness. There is no rebound.  Musculoskeletal: Normal range of motion. She exhibits no edema.  Neurological: She is alert and oriented to person, place, and time.  Skin: Skin is warm and dry.   BP 141/77  Pulse 83  Temp(Src) 98.7 F (37.1 C) (Oral)  Ht 5' 1.5" (1.562 m)  Wt 163 lb (73.936 kg)  BMI 30.30 kg/m2  LMP 01/27/1983  Results for orders placed in visit on 10/19/13  POCT GLYCOSYLATED HEMOGLOBIN (HGB A1C)      Result Value Range   Hemoglobin A1C 9.8%           Assessment & Plan:   1. Hyperlipidemia LDL goal < 100   2. Type II or unspecified type diabetes mellitus with unspecified complication, uncontrolled   3. Essential hypertension, benign   4. Diabetes   5. GAD (generalized anxiety disorder)   6. Other and unspecified hyperlipidemia    Orders Placed This Encounter  Procedures  . CMP14+EGFR  . NMR, lipoprofile  . POCT glycosylated hemoglobin (Hb A1C)   Meds ordered this encounter  Medications  . Insulin Glulisine (APIDRA SOLOSTAR) 100 UNIT/ML SOPN    Sig: Inject 14 Units into the skin 3 (three) times daily with meals. SubQ with meals  sliding scale- If <150- 2u; 151-200=5u; 201-250= 8u;251-300= 11u; 301-350=14u and >351-Call office    Dispense:  5 pen    Refill:  11    Order Specific Question:  Supervising Provider    Answer:  Ernestina Penna [[id number]]  . ALPRAZolam (XANAX) 0.5 MG tablet    Sig: Take 1 tablet (0.5 mg total) by mouth 2 (two) times daily.    Dispense:  60 tablet    Refill:  1    Order Specific Question:  Supervising Provider    Answer:  Ernestina Penna [[id number]]  . rosuvastatin (CRESTOR) 10 MG tablet    Sig: Take 1 tablet (10 mg total) by mouth daily.    Dispense:  30 tablet    Refill:  3    Order Specific Question:  Supervising Provider    Answer:  Deborra Medina   Flu shot today Continue all meds Labs pending Diet and exercise encouraged Health maintenance reviewed Follow up in 3 months Refuses a lot of health maintenance [name], FNP

## 2013-10-20 LAB — NMR, LIPOPROFILE
HDL Cholesterol by NMR: 39 mg/dL — ABNORMAL LOW (ref >=40)
LP-IR Score: 72 — ABNORMAL HIGH (ref <=45)
Small LDL Particle Number: 1387 nmol/L — ABNORMAL HIGH (ref <=527)

## 2013-10-20 LAB — CMP14+EGFR
ALT: 16 IU/L (ref 0–32)
AST: 19 IU/L (ref 0–40)
Albumin/Globulin Ratio: 2.1 (ref 1.1–2.5)
Alkaline Phosphatase: 109 IU/L (ref 39–117)
BUN/Creatinine Ratio: 18 (ref 11–26)
CO2: 24 mmol/L (ref 18–29)
Calcium: 9.1 mg/dL (ref 8.6–10.2)
Potassium: 4.4 mmol/L (ref 3.5–5.2)
Sodium: 142 mmol/L (ref 134–144)

## 2013-10-22 ENCOUNTER — Telehealth: Payer: Self-pay | Admitting: Family Medicine

## 2013-10-22 NOTE — Telephone Encounter (Signed)
Message copied by Azalee Course on Mon Oct 22, 2013 11:26 AM ------      Message from: Bennie Pierini      Created: Mon Oct 22, 2013  8:35 AM       HgbA1c discussed at appointment- increased insulin sliding scale       CMP_ stable      LDL particle # elevated as well as LDL- currently on crestor 10- continue- low fat diet and exercise- if no better at next visit will need to change      Trig elevated- should iimprove with better diabetic control      Recheck in 3 months       ------

## 2013-11-05 ENCOUNTER — Telehealth: Payer: Self-pay | Admitting: *Deleted

## 2013-11-05 MED ORDER — SIMVASTATIN 80 MG PO TABS: 80.0000 mg | ORAL_TABLET | Freq: Every day | ORAL | Status: DC

## 2013-11-05 NOTE — Telephone Encounter (Signed)
crestor causing her to be sick when she takes it she cant get out of the bed and is causing her nausea and vomiting and wants her to go back on zocor. And need a rx for diabetic supplies [facility] and she uses 2 insulin needles a day. She needs syringes, lancets, and test strips

## 2013-11-05 NOTE — Telephone Encounter (Signed)
RX CHANGED AS REQUESTED

## 2013-11-05 NOTE — Telephone Encounter (Signed)
Patient needs refills on her diabetic supplies

## 2013-11-05 NOTE — Telephone Encounter (Signed)
Need to know which accu chek meter patient has in ordered to order strips

## 2013-11-07 NOTE — Telephone Encounter (Signed)
Patient need to new meter told her to come into the lab and pick up a new one

## 2013-11-09 ENCOUNTER — Other Ambulatory Visit: Payer: Self-pay | Admitting: *Deleted

## 2013-11-09 MED ORDER — GLUCOSE BLOOD VI STRP: ORAL_STRIP | Status: DC

## 2013-11-09 MED ORDER — ACCU-CHEK SOFTCLIX LANCETS MISC: Status: DC

## 2013-11-13 ENCOUNTER — Other Ambulatory Visit: Payer: Self-pay

## 2013-11-13 MED ORDER — INSULIN PEN NEEDLE 31G X 8 MM MISC: 2.0000 | Freq: Two times a day (BID) | Status: DC

## 2013-11-29 ENCOUNTER — Other Ambulatory Visit: Payer: Self-pay | Admitting: Nurse Practitioner

## 2013-12-10 DIAGNOSIS — I6930 Unspecified sequelae of cerebral infarction: Secondary | ICD-10-CM | POA: Insufficient documentation

## 2013-12-10 DIAGNOSIS — Z8673 Personal history of transient ischemic attack (TIA), and cerebral infarction without residual deficits: Secondary | ICD-10-CM | POA: Insufficient documentation

## 2013-12-17 ENCOUNTER — Encounter: Payer: Self-pay | Admitting: Nurse Practitioner

## 2013-12-17 ENCOUNTER — Ambulatory Visit (INDEPENDENT_AMBULATORY_CARE_PROVIDER_SITE_OTHER): Payer: Medicare Other | Admitting: Nurse Practitioner

## 2013-12-17 VITALS — BP 136/72 | HR 102 | Temp 100.0°F | Ht 61.5 in | Wt 172.0 lb

## 2013-12-17 DIAGNOSIS — R05 Cough: Secondary | ICD-10-CM

## 2013-12-17 DIAGNOSIS — J329 Chronic sinusitis, unspecified: Secondary | ICD-10-CM

## 2013-12-17 DIAGNOSIS — J209 Acute bronchitis, unspecified: Secondary | ICD-10-CM

## 2013-12-17 DIAGNOSIS — R059 Cough, unspecified: Secondary | ICD-10-CM

## 2013-12-17 LAB — POCT INFLUENZA A/B
Influenza A, POC: NEGATIVE
Influenza B, POC: NEGATIVE

## 2013-12-17 MED ORDER — AZITHROMYCIN 250 MG PO TABS: ORAL_TABLET | ORAL | Status: DC

## 2013-12-17 NOTE — Patient Instructions (Signed)

## 2013-12-17 NOTE — Progress Notes (Signed)
   Subjective:    Patient ID: Kylie DibbleSandra K Ward, female    DOB: [birth year], 68 y.o.   MRN: [id number]  HPI  Patient here today with c/o fever cough and congestion- Started Saturday evening- motrin OTC keeping fever down.    Review of Systems  Constitutional: Positive for fever and chills. Negative for appetite change.  HENT: Positive for congestion, rhinorrhea and sinus pressure. Negative for ear pain and sore throat.   Respiratory: Positive for cough (dry).   Cardiovascular: Negative.   Gastrointestinal: Negative.   All other systems reviewed and are negative.       Objective:   Physical Exam  Constitutional: She is oriented to person, place, and time. She appears well-developed and well-nourished.  HENT:  Right Ear: Hearing, tympanic membrane, external ear and ear canal normal.  Left Ear: Hearing, tympanic membrane, external ear and ear canal normal.  Nose: Mucosal edema and rhinorrhea present. Right sinus exhibits no maxillary sinus tenderness and no frontal sinus tenderness. Left sinus exhibits no maxillary sinus tenderness and no frontal sinus tenderness.  Mouth/Throat: Uvula is midline, oropharynx is clear and moist and mucous membranes are normal.  Eyes: Pupils are equal, round, and reactive to light.  Neck: Normal range of motion. Neck supple.  Cardiovascular: Normal rate, regular rhythm and normal heart sounds.   Pulmonary/Chest: Effort normal and breath sounds normal. No respiratory distress. She has no wheezes.  Deep wet cough  Abdominal: Soft.  Lymphadenopathy:    She has no cervical adenopathy.  Neurological: She is alert and oriented to person, place, and time. She has normal reflexes.  Skin: Skin is warm.  Psychiatric: She has a normal mood and affect. Her behavior is normal. Judgment and thought content normal.  BP 136/72  Pulse 102  Temp(Src) 100 F (37.8 C) (Oral)  Ht 5' 1.5" (1.562 m)  Wt 172 lb (78.019 kg)  BMI 31.98 kg/m2  LMP 01/27/1983           Assessment & Plan:   1. Cough   2. Acute bronchitis   3. Sinusitis    No orders of the defined types were placed in this encounter.   1. Take meds as prescribed 2. Use a cool mist humidifier especially during the winter months and when heat has been humid. 3. Use saline nose sprays frequently 4. Saline irrigations of the nose can be very helpful if done frequently.  * 4X daily for 1 week*  * Use of a nettie pot can be helpful with this. Follow directions with this* 5. Drink plenty of fluids 6. Keep thermostat turn down low 7.For any cough or congestion  Use plain Mucinex- regular strength or max strength is fine   * Children- consult with Pharmacist for dosing 8. For fever or aces or pains- take tylenol or ibuprofen appropriate for age and weight.  * for fevers greater than 101 orally you may alternate ibuprofen and tylenol every  3 hours.   Mary-Margaret Daphine DeutscherMartin, FNP

## 2013-12-24 ENCOUNTER — Telehealth: Payer: Self-pay | Admitting: *Deleted

## 2013-12-24 DIAGNOSIS — R609 Edema, unspecified: Secondary | ICD-10-CM

## 2013-12-24 DIAGNOSIS — I10 Essential (primary) hypertension: Secondary | ICD-10-CM

## 2013-12-24 MED ORDER — LISINOPRIL-HYDROCHLOROTHIAZIDE 20-25 MG PO TABS: 1.0000 | ORAL_TABLET | Freq: Every day | ORAL | Status: DC

## 2013-12-24 NOTE — Telephone Encounter (Signed)
rx sent to pharmacy

## 2013-12-24 NOTE — Telephone Encounter (Signed)
Patient wants to lisinopril 20/25 and not take the lasix anymore please advise. Patient still complaining with a lot of congestion and cough and has not tried mucinex or anything otc for the congestion

## 2014-01-14 DIAGNOSIS — I6529 Occlusion and stenosis of unspecified carotid artery: Secondary | ICD-10-CM | POA: Insufficient documentation

## 2014-01-18 ENCOUNTER — Ambulatory Visit: Payer: Medicare Other | Admitting: Nurse Practitioner

## 2014-02-19 ENCOUNTER — Encounter: Payer: Self-pay | Admitting: Nurse Practitioner

## 2014-02-19 ENCOUNTER — Ambulatory Visit (INDEPENDENT_AMBULATORY_CARE_PROVIDER_SITE_OTHER): Payer: Medicare Other | Admitting: Nurse Practitioner

## 2014-02-19 VITALS — BP 128/72 | HR 87 | Temp 98.6°F | Ht 61.0 in | Wt 165.0 lb

## 2014-02-19 DIAGNOSIS — IMO0002 Reserved for concepts with insufficient information to code with codable children: Secondary | ICD-10-CM

## 2014-02-19 DIAGNOSIS — I10 Essential (primary) hypertension: Secondary | ICD-10-CM

## 2014-02-19 DIAGNOSIS — E1165 Type 2 diabetes mellitus with hyperglycemia: Secondary | ICD-10-CM

## 2014-02-19 DIAGNOSIS — E785 Hyperlipidemia, unspecified: Secondary | ICD-10-CM

## 2014-02-19 DIAGNOSIS — E1140 Type 2 diabetes mellitus with diabetic neuropathy, unspecified: Secondary | ICD-10-CM

## 2014-02-19 DIAGNOSIS — E1149 Type 2 diabetes mellitus with other diabetic neurological complication: Secondary | ICD-10-CM

## 2014-02-19 DIAGNOSIS — E118 Type 2 diabetes mellitus with unspecified complications: Principal | ICD-10-CM

## 2014-02-19 DIAGNOSIS — F411 Generalized anxiety disorder: Secondary | ICD-10-CM

## 2014-02-19 LAB — POCT GLYCOSYLATED HEMOGLOBIN (HGB A1C): Hemoglobin A1C: 11

## 2014-02-19 MED ORDER — METFORMIN HCL ER 750 MG PO TB24: ORAL_TABLET | ORAL | Status: DC

## 2014-02-19 MED ORDER — SIMVASTATIN 80 MG PO TABS: 80.0000 mg | ORAL_TABLET | Freq: Every day | ORAL | Status: DC

## 2014-02-19 MED ORDER — ALPRAZOLAM 0.5 MG PO TABS: 0.5000 mg | ORAL_TABLET | Freq: Two times a day (BID) | ORAL | Status: DC

## 2014-02-19 MED ORDER — INSULIN ASPART 100 UNIT/ML FLEXPEN: PEN_INJECTOR | SUBCUTANEOUS | Status: DC

## 2014-02-19 MED ORDER — GABAPENTIN (ONCE-DAILY) 300 MG PO TABS: 1.0000 | ORAL_TABLET | Freq: Every day | ORAL | Status: DC

## 2014-02-19 MED ORDER — INSULIN DETEMIR 100 UNIT/ML FLEXPEN: 50.0000 [IU] | PEN_INJECTOR | Freq: Two times a day (BID) | SUBCUTANEOUS | Status: DC

## 2014-02-19 NOTE — Patient Instructions (Signed)

## 2014-02-19 NOTE — Progress Notes (Signed)
Subjective:    Patient ID: Kylie Ward, female    DOB: 1946-09-17, 68 y.o.   MRN: [id number]  Patient here today for follow up- she is doing - husband said that insurance will not pay for apidra and we need to change to something else.  Diabetes She presents for her follow-up diabetic visit. She has type 2 diabetes mellitus. No MedicAlert identification noted. The initial diagnosis of diabetes was made 30 years ago. Her disease course has been fluctuating. There are no hypoglycemic associated symptoms. Pertinent negatives for hypoglycemia include no confusion, dizziness, mood changes or sweats. Associated symptoms include blurred vision and visual change ( needs new glasses). Pertinent negatives for diabetes include no chest pain, no foot ulcerations, no polydipsia and no polyphagia. There are no hypoglycemic complications. Diabetic complications include a CVA (light left side) and PVD (carotid stent left). Risk factors for coronary artery disease include dyslipidemia, hypertension, post-menopausal and obesity. Current diabetic treatment includes insulin injections and oral agent (monotherapy). She is compliant with treatment all of the time. She is currently taking insulin at bedtime and pre-breakfast (Patient takes lanutus65u at night and apidra sliding scale with meals averaging 10-12 units.). Insulin injections are given by patient. Rotation sites for injection include the abdominal wall and arms. Her weight is increasing rapidly (sinc patient stopped smoking). She is following a diabetic diet. When asked about meal planning, she reported none. She has not had a previous visit with a dietician. She rarely participates in exercise. Her home blood glucose trend is fluctuating dramatically. Her breakfast blood glucose is taken between 8-9 am. Her breakfast blood glucose range is generally 140-180 mg/dl. Her lunch blood glucose is taken between 12-1 pm. Her lunch blood glucose range is generally 180-200  mg/dl. Her dinner blood glucose is taken between 6-7 pm. Her dinner blood glucose range is generally 180-200 mg/dl. Her highest blood glucose is >200 mg/dl. Her overall blood glucose range is 140-180 mg/dl. An ACE inhibitor/angiotensin II receptor blocker is being taken. She does not see a podiatrist.Eye exam is current (greater than 3 years).  Hypertension This is a chronic problem. The current episode started more than 1 year ago. The problem is unchanged. The problem is controlled. Associated symptoms include blurred vision and peripheral edema (some). Pertinent negatives include no chest pain, malaise/fatigue, shortness of breath or sweats. There are no associated agents to hypertension. Risk factors for coronary artery disease include diabetes mellitus, dyslipidemia, obesity, sedentary lifestyle, post-menopausal state and smoking/tobacco exposure. Past treatments include ACE inhibitors. The current treatment provides moderate improvement. Compliance problems include diet and exercise.  Hypertensive end-organ damage includes CVA (light left side) and PVD (carotid stent left).  Hyperlipidemia This is a chronic problem. The current episode started more than 1 year ago. The problem is controlled. Recent lipid tests were reviewed and are high. Exacerbating diseases include diabetes and obesity. There are no known factors aggravating her hyperlipidemia. Pertinent negatives include no chest pain, focal weakness, myalgias or shortness of breath. Current antihyperlipidemic treatment includes statins. The current treatment provides mild improvement of lipids. Compliance problems include medication side effects, adherence to diet and adherence to exercise.  Risk factors for coronary artery disease include diabetes mellitus, hypertension, post-menopausal and obesity.  GAD Xanax .5 as needed- keeps her calm * burning sensation in feet at night- wakes her up several nights a week  Review of Systems  Constitutional:  Negative.  Negative for malaise/fatigue.  HENT: Negative.   Eyes: Positive for blurred vision.  Respiratory: Negative.  Negative for shortness of breath.   Cardiovascular: Negative.  Negative for chest pain.  Gastrointestinal: Negative.   Endocrine: Negative.  Negative for polydipsia and polyphagia.  Genitourinary: Negative.   Musculoskeletal: Negative.  Negative for myalgias.  Neurological: Positive for numbness. Negative for dizziness and focal weakness.  Psychiatric/Behavioral: Negative for confusion.       Objective:   Physical Exam  Constitutional: She is oriented to person, place, and time. She appears well-developed and well-nourished.  HENT:  Head: Normocephalic.  Eyes: Pupils are equal, round, and reactive to light.  Neck: Normal range of motion.  Cardiovascular: Normal rate, regular rhythm and normal heart sounds.   No murmur heard. Pulmonary/Chest: Effort normal and breath sounds normal.  Abdominal: Soft. Bowel sounds are normal. There is no tenderness. There is no rebound.  Musculoskeletal: Normal range of motion. She exhibits no edema.  Neurological: She is alert and oriented to person, place, and time.  Skin: Skin is warm and dry.  BP 128/72  Pulse 87  Temp(Src) 98.6 F (37 C) (Oral)  Ht '5\' 1"'  (1.549 m)  Wt 165 lb (74.844 kg)  BMI 31.19 kg/m2  LMP 01/27/1983     Assessment & Plan:   1. Type II or unspecified type diabetes mellitus with unspecified complication, uncontrolled   2. Hyperlipidemia LDL goal < 100   3. Essential hypertension, benign   4. GAD (generalized anxiety disorder)   5. Diabetic neuropathy, type II diabetes mellitus    Orders Placed This Encounter  Procedures  . CMP14+EGFR  . NMR, lipoprofile  . POCT glycosylated hemoglobin (Hb A1C)   Meds ordered this encounter  Medications  . Insulin Detemir (LEVEMIR FLEXTOUCH) 100 UNIT/ML Pen    Sig: Inject 50 Units into the skin 2 (two) times daily.    Dispense:  15 mL    Refill:  11     Order Specific Question:  Supervising Provider    Answer:  Chipper Herb [[id number]]  . insulin aspart (NOVOLOG FLEXPEN) 100 UNIT/ML FlexPen    Sig: Up to 14units 3X a day sliding scale    Dispense:  15 mL    Refill:  11    Order Specific Question:  Supervising Provider    Answer:  Chipper Herb [[id number]]  . metFORMIN (GLUCOPHAGE-XR) 750 MG 24 hr tablet    Sig: TAKE ONE TABLET BY MOUTH DAILY WITH BREAKFAST    Dispense:  30 tablet    Refill:  5    Order Specific Question:  Supervising Provider    Answer:  Chipper Herb [[id number]]  . simvastatin (ZOCOR) 80 MG tablet    Sig: Take 1 tablet (80 mg total) by mouth daily.    Dispense:  30 tablet    Refill:  2    Order Specific Question:  Supervising Provider    Answer:  Chipper Herb [[id number]]  . ALPRAZolam (XANAX) 0.5 MG tablet    Sig: Take 1 tablet (0.5 mg total) by mouth 2 (two) times daily.    Dispense:  60 tablet    Refill:  1    Order Specific Question:  Supervising Provider    Answer:  Chipper Herb [[id number]]  . Gabapentin, PHN, 300 MG TABS    Sig: Take 1 tablet by mouth at bedtime.    Dispense:  30 tablet    Refill:  3    Order Specific Question:  Supervising Provider    Answer:  Chipper Herb [[id number]]   Changed from lantus to  levemir- and increased dose to 50u BID Changed apidra- to novolog Labs pending Health maintenance reviewed Diet and exercise encouraged Continue all meds Follow up  In 3 month   [place], FNP

## 2014-02-20 ENCOUNTER — Other Ambulatory Visit: Payer: Self-pay | Admitting: Nurse Practitioner

## 2014-02-20 ENCOUNTER — Encounter: Payer: Self-pay | Admitting: *Deleted

## 2014-02-20 LAB — CMP14+EGFR
A/G RATIO: 2.4 (ref 1.1–2.5)
ALT: 20 IU/L (ref 0–32)
AST: 19 IU/L (ref 0–40)
Albumin: 4.7 g/dL (ref 3.6–4.8)
Alkaline Phosphatase: 96 IU/L (ref 39–117)
BUN/Creatinine Ratio: 26 (ref 11–26)
BUN: 21 mg/dL (ref 8–27)
CO2: 27 mmol/L (ref 18–29)
Calcium: 9.8 mg/dL (ref 8.7–10.3)
Chloride: 99 mmol/L (ref 97–108)
Creatinine, Ser: 0.81 mg/dL (ref 0.57–1.00)
GFR, EST AFRICAN AMERICAN: 88 mL/min/{1.73_m2} (ref >59)
GFR, EST NON AFRICAN AMERICAN: 76 mL/min/{1.73_m2} (ref >59)
GLOBULIN, TOTAL: 2 g/dL (ref 1.5–4.5)
Glucose: 137 mg/dL — ABNORMAL HIGH (ref 65–99)
Potassium: 5.4 mmol/L — ABNORMAL HIGH (ref 3.5–5.2)
SODIUM: 141 mmol/L (ref 134–144)
Total Bilirubin: <0.2 mg/dL (ref 0.0–1.2)
Total Protein: 6.7 g/dL (ref 6.0–8.5)

## 2014-02-20 LAB — NMR, LIPOPROFILE
Cholesterol: 188 mg/dL (ref <200)
HDL Cholesterol by NMR: 35 mg/dL — ABNORMAL LOW (ref >=40)
HDL PARTICLE NUMBER: 37.2 umol/L (ref >=30.5)
LDL Particle Number: 1307 nmol/L — ABNORMAL HIGH (ref <1000)
LDL SIZE: 19.6 nm (ref >20.5)
LP-IR Score: 73 — ABNORMAL HIGH (ref <=45)
Small LDL Particle Number: 1139 nmol/L — ABNORMAL HIGH (ref <=527)
Triglycerides by NMR: 515 mg/dL — ABNORMAL HIGH (ref <150)

## 2014-02-20 NOTE — Progress Notes (Signed)
Quick Note:  Copy of labs sent to patient ______ 

## 2014-03-08 ENCOUNTER — Other Ambulatory Visit: Payer: Self-pay

## 2014-03-08 DIAGNOSIS — E118 Type 2 diabetes mellitus with unspecified complications: Principal | ICD-10-CM

## 2014-03-08 DIAGNOSIS — E785 Hyperlipidemia, unspecified: Secondary | ICD-10-CM

## 2014-03-08 DIAGNOSIS — IMO0002 Reserved for concepts with insufficient information to code with codable children: Secondary | ICD-10-CM

## 2014-03-08 DIAGNOSIS — E1165 Type 2 diabetes mellitus with hyperglycemia: Secondary | ICD-10-CM

## 2014-03-08 MED ORDER — SIMVASTATIN 80 MG PO TABS: 80.0000 mg | ORAL_TABLET | Freq: Every day | ORAL | Status: DC

## 2014-03-08 MED ORDER — METFORMIN HCL ER 750 MG PO TB24: ORAL_TABLET | ORAL | Status: DC

## 2014-03-08 MED ORDER — FUROSEMIDE 20 MG PO TABS: 20.0000 mg | ORAL_TABLET | Freq: Every day | ORAL | Status: DC

## 2014-03-08 NOTE — Telephone Encounter (Signed)
Last seen 02/19/14  MMM This med not on EPIC list  Patient requesting 90 day supply

## 2014-04-01 ENCOUNTER — Telehealth: Payer: Self-pay | Admitting: *Deleted

## 2014-04-01 NOTE — Telephone Encounter (Signed)
This telephone note should be deleted.  Wrong chart with similar name.

## 2014-04-01 NOTE — Telephone Encounter (Signed)
Kylie Ward called to ask if it was ok for her to get a shingles injection while she is on chemotherapy.  Instructed her to talk to Adrena when she comes in for her appointment today.  States understanding

## 2014-04-01 NOTE — Telephone Encounter (Signed)
No encounter

## 2014-04-02 NOTE — Telephone Encounter (Signed)
No telephone call at this time

## 2014-05-21 ENCOUNTER — Encounter: Payer: Self-pay | Admitting: Nurse Practitioner

## 2014-05-21 ENCOUNTER — Ambulatory Visit (INDEPENDENT_AMBULATORY_CARE_PROVIDER_SITE_OTHER): Payer: Medicare Other | Admitting: Nurse Practitioner

## 2014-05-21 VITALS — BP 146/90 | HR 85 | Temp 97.6°F | Ht 61.0 in | Wt 163.0 lb

## 2014-05-21 DIAGNOSIS — F411 Generalized anxiety disorder: Secondary | ICD-10-CM

## 2014-05-21 DIAGNOSIS — E1149 Type 2 diabetes mellitus with other diabetic neurological complication: Secondary | ICD-10-CM

## 2014-05-21 DIAGNOSIS — I10 Essential (primary) hypertension: Secondary | ICD-10-CM

## 2014-05-21 DIAGNOSIS — IMO0002 Reserved for concepts with insufficient information to code with codable children: Secondary | ICD-10-CM

## 2014-05-21 DIAGNOSIS — E1165 Type 2 diabetes mellitus with hyperglycemia: Secondary | ICD-10-CM

## 2014-05-21 DIAGNOSIS — E118 Type 2 diabetes mellitus with unspecified complications: Principal | ICD-10-CM

## 2014-05-21 DIAGNOSIS — E785 Hyperlipidemia, unspecified: Secondary | ICD-10-CM

## 2014-05-21 DIAGNOSIS — E1142 Type 2 diabetes mellitus with diabetic polyneuropathy: Secondary | ICD-10-CM

## 2014-05-21 DIAGNOSIS — E1140 Type 2 diabetes mellitus with diabetic neuropathy, unspecified: Secondary | ICD-10-CM

## 2014-05-21 LAB — POCT UA - MICROALBUMIN: MICROALBUMIN (UR) POC: 50 mg/L

## 2014-05-21 LAB — POCT GLYCOSYLATED HEMOGLOBIN (HGB A1C): HEMOGLOBIN A1C: 13.2

## 2014-05-21 LAB — GLUCOSE, POCT (MANUAL RESULT ENTRY): POC GLUCOSE: 218 mg/dL — AB (ref 70–99)

## 2014-05-21 MED ORDER — INSULIN GLARGINE 100 UNIT/ML SOLOSTAR PEN: 65.0000 [IU] | PEN_INJECTOR | Freq: Every day | SUBCUTANEOUS | Status: DC

## 2014-05-21 MED ORDER — ALPRAZOLAM 0.5 MG PO TABS: 0.5000 mg | ORAL_TABLET | Freq: Two times a day (BID) | ORAL | Status: DC

## 2014-05-21 MED ORDER — LISINOPRIL-HYDROCHLOROTHIAZIDE 20-25 MG PO TABS: 1.0000 | ORAL_TABLET | Freq: Every day | ORAL | Status: DC

## 2014-05-21 MED ORDER — METFORMIN HCL ER 750 MG PO TB24: ORAL_TABLET | ORAL | Status: DC

## 2014-05-21 MED ORDER — SIMVASTATIN 80 MG PO TABS: 80.0000 mg | ORAL_TABLET | Freq: Every day | ORAL | Status: DC

## 2014-05-21 NOTE — Progress Notes (Signed)
Subjective:    Patient ID: Kylie Ward, female    DOB: [birth year], 68 y.o.   MRN: [id number]  Patient here today for Chronic disease follow up. No acute complains.   Diabetes She presents for her follow-up diabetic visit. She has type 2 diabetes mellitus. No MedicAlert identification noted. The initial diagnosis of diabetes was made 30 years ago. Her disease course has been fluctuating. There are no hypoglycemic associated symptoms. Pertinent negatives for hypoglycemia include no confusion, dizziness, mood changes or sweats. Associated symptoms include blurred vision and visual change ( needs new glasses). Pertinent negatives for diabetes include no chest pain, no foot ulcerations, no polydipsia and no polyphagia. There are no hypoglycemic complications. Diabetic complications include a CVA (light left side) and PVD (carotid stent left). Risk factors for coronary artery disease include dyslipidemia, hypertension, post-menopausal and obesity. Current diabetic treatment includes insulin injections and oral agent (monotherapy). She is compliant with treatment all of the time. She is currently taking insulin at bedtime and pre-breakfast (Patient takes lanutus65u at night and apidra sliding scale with meals averaging 10-12 units.). Insulin injections are given by patient. Rotation sites for injection include the abdominal wall and arms. Her weight is increasing rapidly (sinc patient stopped smoking). She is following a diabetic diet. When asked about meal planning, she reported none. She has not had a previous visit with a dietician. She rarely participates in exercise. Her home blood glucose trend is fluctuating dramatically. Her breakfast blood glucose is taken between 8-9 am. Her breakfast blood glucose range is generally 140-180 mg/dl. Her lunch blood glucose is taken between 12-1 pm. Her lunch blood glucose range is generally 180-200 mg/dl. Her dinner blood glucose is taken between 6-7 pm. Her dinner  blood glucose range is generally 180-200 mg/dl. Her highest blood glucose is >200 mg/dl. Her overall blood glucose range is 140-180 mg/dl. An ACE inhibitor/angiotensin II receptor blocker is being taken. She does not see a podiatrist.Eye exam is current (greater than 3 years).  Hyperlipidemia This is a chronic problem. The current episode started more than 1 year ago. The problem is controlled. Recent lipid tests were reviewed and are high. Exacerbating diseases include diabetes and obesity. There are no known factors aggravating her hyperlipidemia. Pertinent negatives include no chest pain, focal weakness, myalgias or shortness of breath. Current antihyperlipidemic treatment includes statins. The current treatment provides mild improvement of lipids. Compliance problems include medication side effects, adherence to diet and adherence to exercise.  Risk factors for coronary artery disease include diabetes mellitus, hypertension, post-menopausal and obesity.  Hypertension This is a chronic problem. The current episode started more than 1 year ago. The problem is unchanged. The problem is controlled. Associated symptoms include blurred vision and peripheral edema (some). Pertinent negatives include no chest pain, malaise/fatigue, shortness of breath or sweats. There are no associated agents to hypertension. Risk factors for coronary artery disease include diabetes mellitus, dyslipidemia, obesity, sedentary lifestyle, post-menopausal state and smoking/tobacco exposure. Past treatments include ACE inhibitors. The current treatment provides moderate improvement. Compliance problems include diet and exercise.  Hypertensive end-organ damage includes CVA (light left side) and PVD (carotid stent left).  GAD Xanax .5 as needed- keeps her calm * burning sensation in feet at night- wakes her up several nights a week  Review of Systems  Constitutional: Negative.  Negative for malaise/fatigue.  HENT: Negative.   Eyes:  Positive for blurred vision.  Respiratory: Negative.  Negative for shortness of breath.   Cardiovascular: Negative.  Negative for chest pain.  Gastrointestinal: Negative.   Endocrine: Negative.  Negative for polydipsia and polyphagia.  Genitourinary: Negative.   Musculoskeletal: Negative.  Negative for myalgias.  Neurological: Positive for numbness. Negative for dizziness and focal weakness.  Psychiatric/Behavioral: Negative for confusion.       Objective:   Physical Exam  Constitutional: She is oriented to person, place, and time. She appears well-developed and well-nourished.  HENT:  Head: Normocephalic.  Eyes: Pupils are equal, round, and reactive to light.  Neck: Normal range of motion.  Cardiovascular: Normal rate, regular rhythm and normal heart sounds.   No murmur heard. Pulmonary/Chest: Effort normal and breath sounds normal.  Abdominal: Soft. Bowel sounds are normal. There is no tenderness. There is no rebound.  Musculoskeletal: Normal range of motion. She exhibits no edema.  Neurological: She is alert and oriented to person, place, and time.  Skin: Skin is warm and dry.   BP 146/90  Pulse 85  Temp(Src) 97.6 F (36.4 C) (Oral)  Ht '5\' 1"'  (1.549 m)  Wt 163 lb (73.936 kg)  BMI 30.81 kg/m2  LMP 01/27/1983  Results for orders placed in visit on 05/21/14  POCT UA - MICROALBUMIN      Result Value Ref Range   Microalbumin Ur, POC 50    POCT GLYCOSYLATED HEMOGLOBIN (HGB A1C)      Result Value Ref Range   Hemoglobin A1C 13.2    GLUCOSE, POCT (MANUAL RESULT ENTRY)      Result Value Ref Range   POC Glucose 218 (*) 70 - 99 mg/dl       Assessment & Plan:   1. Type II or unspecified type diabetes mellitus with unspecified complication, uncontrolled   2. Hyperlipidemia with target LDL less than 100   3. Essential hypertension, benign   4. Diabetic neuropathy, type II diabetes mellitus   5. GAD (generalized anxiety disorder)    Orders Placed This Encounter   Procedures  . CMP14+EGFR  . NMR, lipoprofile  . Microalbumin, urine  . POCT UA - Microalbumin  . POCT glycosylated hemoglobin (Hb A1C)  . POCT glucose (manual entry)   Meds ordered this encounter  Medications  . metFORMIN (GLUCOPHAGE-XR) 750 MG 24 hr tablet    Sig: TAKE ONE TABLET BY MOUTH DAILY WITH BREAKFAST    Dispense:  90 tablet    Refill:  1    Order Specific Question:  Supervising Provider    Answer:  Chipper Herb [[id number]]  . simvastatin (ZOCOR) 80 MG tablet    Sig: Take 1 tablet (80 mg total) by mouth daily.    Dispense:  90 tablet    Refill:  0    Order Specific Question:  Supervising Provider    Answer:  Chipper Herb [[id number]]  . lisinopril-hydrochlorothiazide (PRINZIDE,ZESTORETIC) 20-25 MG per tablet    Sig: Take 1 tablet by mouth daily.    Dispense:  90 tablet    Refill:  1    Order Specific Question:  Supervising Provider    Answer:  Chipper Herb [[id number]]  . ALPRAZolam (XANAX) 0.5 MG tablet    Sig: Take 1 tablet (0.5 mg total) by mouth 2 (two) times daily.    Dispense:  60 tablet    Refill:  1    Order Specific Question:  Supervising Provider    Answer:  Chipper Herb [[id number]]  . Insulin Glargine (LANTUS SOLOSTAR) 100 UNIT/ML Solostar Pen    Sig: Inject 65 Units into the skin daily at 10 pm.    Dispense:  10 pen    Refill:  PRN    Order Specific Question:  Supervising Provider    Answer:  Chipper Herb [[id number]]  patient will make appointment for eye exam Patient to increase sliding scale by 2 u per dosage- bring sliding scale with you to next appointmnent Stop levemir- switch to lantus Labs pending Health maintenance reviewed Diet and exercise encouraged Continue all meds Follow up  In 1 months   [place], [place], [place]

## 2014-05-21 NOTE — Patient Instructions (Signed)

## 2014-05-22 LAB — CMP14+EGFR
ALT: 23 IU/L (ref 0–32)
AST: 13 IU/L (ref 0–40)
Albumin/Globulin Ratio: 2.3 (ref 1.1–2.5)
Albumin: 4.4 g/dL (ref 3.6–4.8)
Alkaline Phosphatase: 102 IU/L (ref 39–117)
BUN / CREAT RATIO: 24 (ref 11–26)
BUN: 23 mg/dL (ref 8–27)
CALCIUM: 9.6 mg/dL (ref 8.7–10.3)
CO2: 25 mmol/L (ref 18–29)
CREATININE: 0.95 mg/dL (ref 0.57–1.00)
Chloride: 96 mmol/L — ABNORMAL LOW (ref 97–108)
GFR calc Af Amer: 72 mL/min/{1.73_m2} (ref >59)
GFR, EST NON AFRICAN AMERICAN: 63 mL/min/{1.73_m2} (ref >59)
Globulin, Total: 1.9 g/dL (ref 1.5–4.5)
Glucose: 211 mg/dL — ABNORMAL HIGH (ref 65–99)
POTASSIUM: 5.4 mmol/L — AB (ref 3.5–5.2)
Sodium: 138 mmol/L (ref 134–144)
Total Bilirubin: 0.3 mg/dL (ref 0.0–1.2)
Total Protein: 6.3 g/dL (ref 6.0–8.5)

## 2014-05-22 LAB — MICROALBUMIN, URINE: MICROALBUM., U, RANDOM: 43.6 ug/mL — AB (ref 0.0–17.0)

## 2014-05-22 LAB — NMR, LIPOPROFILE
Cholesterol: 167 mg/dL (ref 100–199)
HDL CHOLESTEROL BY NMR: 35 mg/dL — AB (ref >39)
HDL Particle Number: 32.8 umol/L (ref >=30.5)
LDL Particle Number: 986 nmol/L (ref <1000)
LDL SIZE: 19.7 nm (ref >20.5)
LDLC SERPL CALC-MCNC: 56 mg/dL (ref 0–99)
LP-IR Score: 76 — ABNORMAL HIGH (ref <=45)
Small LDL Particle Number: 788 nmol/L — ABNORMAL HIGH (ref <=527)
Triglycerides by NMR: 380 mg/dL — ABNORMAL HIGH (ref 0–149)

## 2014-05-30 ENCOUNTER — Other Ambulatory Visit: Payer: Self-pay | Admitting: *Deleted

## 2014-05-30 DIAGNOSIS — E1165 Type 2 diabetes mellitus with hyperglycemia: Secondary | ICD-10-CM

## 2014-05-30 DIAGNOSIS — IMO0002 Reserved for concepts with insufficient information to code with codable children: Secondary | ICD-10-CM

## 2014-05-30 DIAGNOSIS — E118 Type 2 diabetes mellitus with unspecified complications: Principal | ICD-10-CM

## 2014-05-30 MED ORDER — INSULIN GLARGINE 100 UNIT/ML ~~LOC~~ SOLN: 64.0000 [IU] | Freq: Every day | SUBCUTANEOUS | Status: DC

## 2014-05-30 MED ORDER — INSULIN ASPART 100 UNIT/ML ~~LOC~~ SOLN: 14.0000 [IU] | Freq: Three times a day (TID) | SUBCUTANEOUS | Status: DC

## 2014-06-16 ENCOUNTER — Other Ambulatory Visit: Payer: Self-pay | Admitting: Nurse Practitioner

## 2014-06-17 NOTE — Telephone Encounter (Signed)
Last ov 7/15. 

## 2014-06-26 ENCOUNTER — Ambulatory Visit: Payer: Medicare Other | Admitting: Nurse Practitioner

## 2014-09-02 ENCOUNTER — Telehealth: Payer: Self-pay | Admitting: Nurse Practitioner

## 2014-09-06 ENCOUNTER — Ambulatory Visit (INDEPENDENT_AMBULATORY_CARE_PROVIDER_SITE_OTHER): Payer: Medicare Other | Admitting: Nurse Practitioner

## 2014-09-06 ENCOUNTER — Encounter: Payer: Self-pay | Admitting: Nurse Practitioner

## 2014-09-06 ENCOUNTER — Encounter (INDEPENDENT_AMBULATORY_CARE_PROVIDER_SITE_OTHER): Payer: Self-pay

## 2014-09-06 VITALS — BP 126/71 | HR 95 | Temp 97.7°F | Ht 61.0 in | Wt 164.4 lb

## 2014-09-06 DIAGNOSIS — E1140 Type 2 diabetes mellitus with diabetic neuropathy, unspecified: Secondary | ICD-10-CM

## 2014-09-06 DIAGNOSIS — Z23 Encounter for immunization: Secondary | ICD-10-CM

## 2014-09-06 DIAGNOSIS — I10 Essential (primary) hypertension: Secondary | ICD-10-CM

## 2014-09-06 DIAGNOSIS — E785 Hyperlipidemia, unspecified: Secondary | ICD-10-CM

## 2014-09-06 DIAGNOSIS — E1142 Type 2 diabetes mellitus with diabetic polyneuropathy: Secondary | ICD-10-CM

## 2014-09-06 LAB — POCT GLYCOSYLATED HEMOGLOBIN (HGB A1C)

## 2014-09-06 MED ORDER — METFORMIN HCL ER 750 MG PO TB24: ORAL_TABLET | ORAL | Status: DC

## 2014-09-06 MED ORDER — LISINOPRIL-HYDROCHLOROTHIAZIDE 20-25 MG PO TABS: 1.0000 | ORAL_TABLET | Freq: Every day | ORAL | Status: DC

## 2014-09-06 MED ORDER — INSULIN GLARGINE 100 UNIT/ML ~~LOC~~ SOLN: 64.0000 [IU] | Freq: Every day | SUBCUTANEOUS | Status: DC

## 2014-09-06 MED ORDER — SIMVASTATIN 80 MG PO TABS: 80.0000 mg | ORAL_TABLET | Freq: Every day | ORAL | Status: DC

## 2014-09-06 MED ORDER — EZETIMIBE 10 MG PO TABS: 10.0000 mg | ORAL_TABLET | Freq: Every day | ORAL | Status: DC

## 2014-09-06 MED ORDER — GABAPENTIN (ONCE-DAILY) 300 MG PO TABS: 1.0000 | ORAL_TABLET | Freq: Every day | ORAL | Status: DC

## 2014-09-06 NOTE — Progress Notes (Signed)
Subjective:    Patient ID: Kylie Ward, female    DOB: [birth year], 68 y.o.   MRN: [id number]  Patient here today for Chronic disease follow up. No acute complains.   Diabetes She presents for her follow-up diabetic visit. She has type 2 diabetes mellitus. Pertinent negatives for hypoglycemia include no confusion or dizziness. Associated symptoms include visual change ( needs new glasses). Pertinent negatives for diabetes include no chest pain, no polydipsia and no polyphagia. Diabetic complications include nephropathy (feet). Risk factors for coronary artery disease include dyslipidemia, hypertension and post-menopausal. Current diabetic treatment includes insulin injections and oral agent (monotherapy). She is compliant with treatment most of the time. Her weight is stable. She has not had a previous visit with a dietitian. She rarely participates in exercise. Her breakfast blood glucose is taken between 9-10 am. Her breakfast blood glucose range is generally 130-140 mg/dl. Her lunch blood glucose is taken between 2-3 pm. Her lunch blood glucose range is generally 140-180 mg/dl. Her dinner blood glucose is taken between 7-8 pm. Her dinner blood glucose range is generally 140-180 mg/dl. Her highest blood glucose is >200 mg/dl. Her overall blood glucose range is 140-180 mg/dl. An ACE inhibitor/angiotensin II receptor blocker is being taken. She does not see a podiatrist.Eye exam is not current.  Hyperlipidemia This is a chronic problem. The current episode started more than 1 year ago. The problem is uncontrolled. Recent lipid tests were reviewed and are high. Exacerbating diseases include diabetes and obesity. Pertinent negatives include no chest pain, myalgias or shortness of breath. Current antihyperlipidemic treatment includes statins. The current treatment provides moderate improvement of lipids. Compliance problems include adherence to diet and adherence to exercise.  Risk factors for coronary  artery disease include diabetes mellitus, hypertension, obesity and post-menopausal.  Hypertension This is a chronic problem. The current episode started more than 1 year ago. The problem is controlled. Pertinent negatives include no chest pain or shortness of breath. Risk factors for coronary artery disease include diabetes mellitus, dyslipidemia, obesity and post-menopausal state. Past treatments include diuretics and ACE inhibitors. The current treatment provides moderate improvement. Compliance problems include diet and exercise.   GAD Xanax .5 as needed- keeps her calm Neuropathy feet Patient currently taking neurotin daily which helps some with burning sensation.       Review of Systems  Constitutional: Negative.   HENT: Negative.   Respiratory: Negative.  Negative for shortness of breath.   Cardiovascular: Negative.  Negative for chest pain.  Gastrointestinal: Negative.   Endocrine: Negative.  Negative for polydipsia and polyphagia.  Genitourinary: Negative.   Musculoskeletal: Negative.  Negative for myalgias.  Neurological: Positive for numbness. Negative for dizziness.  Psychiatric/Behavioral: Negative for confusion.       Objective:   Physical Exam  Constitutional: She is oriented to person, place, and time. She appears well-developed and well-nourished.  HENT:  Nose: Nose normal.  Mouth/Throat: Oropharynx is clear and moist.  Eyes: EOM are normal.  Neck: Trachea normal, normal range of motion and full passive range of motion without pain. Neck supple. No JVD present. Carotid bruit is not present. No thyromegaly present.  Cardiovascular: Normal rate, regular rhythm, normal heart sounds and intact distal pulses.  Exam reveals no gallop and no friction rub.   No murmur heard. Pulmonary/Chest: Effort normal and breath sounds normal.  Abdominal: Soft. Bowel sounds are normal. She exhibits no distension and no mass. There is no tenderness.  Musculoskeletal: Normal range of  motion.  Lymphadenopathy:  She has no cervical adenopathy.  Neurological: She is alert and oriented to person, place, and time. She has normal reflexes.  Skin: Skin is warm and dry.  Psychiatric: She has a normal mood and affect. Her behavior is normal. Judgment and thought content normal.   LMP 01/27/1983  Results for orders placed or performed in visit on 08/14/14  [facility] DIABETES EYE EXAM  Result Value Ref Range   [facility] Diabetic Eye Exam  No Retinopathy   Results for orders placed or performed in visit on 09/06/14  POCT glycosylated hemoglobin (Hb A1C)  Result Value Ref Range   Hemoglobin A1C 11.8%        Assessment & Plan:    1. Essential hypertension, benign Low NA+ - CMP14+EGFR - lisinopril-hydrochlorothiazide (PRINZIDE,ZESTORETIC) 20-25 MG per tablet; Take 1 tablet by mouth daily.  Dispense: 90 tablet; Refill: 1  2. Type 2 diabetes mellitus with diabetic polyneuropathy Sliding scale for apidra changed- see pt education - POCT glycosylated hemoglobin (Hb A1C) - metFORMIN (GLUCOPHAGE-XR) 750 MG 24 hr tablet; TAKE ONE TABLET BY MOUTH DAILY WITH BREAKFAST  Dispense: 90 tablet; Refill: 1 - insulin glargine (LANTUS) 100 UNIT/ML injection; Inject 0.64 mLs (64 Units total) into the skin at bedtime.  Dispense: 30 mL; Refill: 5  3. Diabetic neuropathy, type II diabetes mellitus - Gabapentin, Once-Daily, 300 MG TABS; Take 1 tablet by mouth at bedtime.  Dispense: 30 tablet; Refill: 3  4. Hyperlipidemia with target LDL less than 100 Low fat diet - NMR, lipoprofile - simvastatin (ZOCOR) 80 MG tablet; Take 1 tablet (80 mg total) by mouth daily.  Dispense: 90 tablet; Refill: 1 - ezetimibe (ZETIA) 10 MG tablet; Take 1 tablet (10 mg total) by mouth daily.  Dispense: 30 tablet; Refill: 3    Labs pending Health maintenance reviewed Diet and exercise encouraged Continue all meds Follow up  In 3 months   [place], FNP

## 2014-09-06 NOTE — Patient Instructions (Addendum)
Apidra sliding scale:  100- 150   3u 151 -200   3u + 5u= 8u 201-250    3u + 7u= 10u 251-300    3u +10u= 13u 301-350    3u + 13u= 16u 351- 400   3u + 15u= 18u  >400 call office   What are Advance Directives? A living will allows you to document your wishes concerning medical treatments at the end of life.   Before your living will can guide medical decision-making two physicians must certify: You are unable to make medical decisions,  You are in the medical condition specified in the state's living will law (such as "terminal illness" or "permanent unconsciousness"),  Other requirements also may apply, depending upon the state. A medical power of attorney (or healthcare proxy) allows you to appoint a person you trust as your healthcare agent (or surrogate decision maker), who is authorized to make medical decisions on your behalf.   Before a medical power of attorney goes into effect a person's physician must conclude that they are unable to make their own medical decisions. In addition: If a person regains the ability to make decisions, the agent cannot continue to act on the person's behalf.  Many states have additional requirements that apply only to decisions about life-sustaining medical treatments.  For example, before your agent can refuse a life-sustaining treatment on your behalf, a second physician may have to confirm your doctor's assessment that you are incapable of making treatment decisions. What Else Do I Need to Know?  Advance directives are legally valid throughout the [place]. While you do not need a lawyer to fill out an advance directive, your advance directive becomes legally valid as soon as you sign them in front of the required witnesses. The laws governing advance directives vary from state to state, so it is important to complete and sign advance directives that comply with your state's law. Also, advance directives can have different titles in different  states.  Emergency medical technicians cannot honor living wills or medical powers of attorney. Once emergency personnel have been called, they must do what is necessary to stabilize a person for transfer to a hospital, both from accident sites and from a home or other facility. After a physician fully evaluates the person's condition and determines the underlying conditions, advance directives can be implemented.  One state's advance directive does not always work in another state. Some states do honor advance directives from another state; others will honor out-of-state advance directives as long as they are similar to the state's own law; and some states do not have an answer to this question. The best solution is if you spend a significant amount of time in more than one state, you should complete the advance directives for all the states you spend a significant amount of time in.  Advance directives do not expire. An advance directive remains in effect until you change it. If you complete a new advance directive, it invalidates the previous one.  You should review your advance directives periodically to ensure that they still reflect your wishes. If you want to change anything in an advance directive once you have completed it, you should complete a whole new document. [facility]    [facility], [email or web address]

## 2014-09-07 ENCOUNTER — Other Ambulatory Visit: Payer: Self-pay | Admitting: Nurse Practitioner

## 2014-09-08 LAB — CMP14+EGFR
ALBUMIN: 4.4 g/dL (ref 3.6–4.8)
ALT: 20 IU/L (ref 0–32)
AST: 12 IU/L (ref 0–40)
Albumin/Globulin Ratio: 2.1 (ref 1.1–2.5)
Alkaline Phosphatase: 122 IU/L — ABNORMAL HIGH (ref 39–117)
BUN/Creatinine Ratio: 24 (ref 11–26)
BUN: 19 mg/dL (ref 8–27)
CO2: 22 mmol/L (ref 18–29)
CREATININE: 0.79 mg/dL (ref 0.57–1.00)
Calcium: 9.8 mg/dL (ref 8.7–10.3)
Chloride: 94 mmol/L — ABNORMAL LOW (ref 97–108)
GFR calc Af Amer: 90 mL/min/{1.73_m2} (ref >59)
GFR calc non Af Amer: 78 mL/min/{1.73_m2} (ref >59)
GLUCOSE: 266 mg/dL — AB (ref 65–99)
Globulin, Total: 2.1 g/dL (ref 1.5–4.5)
Potassium: 4.8 mmol/L (ref 3.5–5.2)
Sodium: 135 mmol/L (ref 134–144)
TOTAL PROTEIN: 6.5 g/dL (ref 6.0–8.5)
Total Bilirubin: <0.2 mg/dL (ref 0.0–1.2)

## 2014-09-08 LAB — NMR, LIPOPROFILE
Cholesterol: 238 mg/dL — ABNORMAL HIGH (ref 100–199)
HDL Cholesterol by NMR: 36 mg/dL — ABNORMAL LOW (ref >39)
HDL Particle Number: 31.8 umol/L (ref >=30.5)
LDL Particle Number: 2138 nmol/L — ABNORMAL HIGH (ref <1000)
LDL Size: 19.6 nm (ref >20.5)
LP-IR Score: 73 — ABNORMAL HIGH (ref <=45)
Small LDL Particle Number: 1788 nmol/L — ABNORMAL HIGH (ref <=527)
Triglycerides by NMR: 687 mg/dL (ref 0–149)

## 2014-09-09 ENCOUNTER — Telehealth: Payer: Self-pay | Admitting: Nurse Practitioner

## 2014-09-09 NOTE — Telephone Encounter (Signed)
-----   Message from [facility], FNP sent at 09/09/2014  8:18 AM EST ----- Hgba1c discussed at appointment- discussed at appointment- increased lantus and increased sliding scale insulin Kidney and liver function stable LDL particle numbers lok terrible- they were good last time- get back on zocor an dzetia as rx. Recheck in 3 months

## 2014-09-10 ENCOUNTER — Other Ambulatory Visit: Payer: Self-pay | Admitting: *Deleted

## 2014-09-10 ENCOUNTER — Other Ambulatory Visit: Payer: Self-pay | Admitting: Nurse Practitioner

## 2014-09-10 DIAGNOSIS — F411 Generalized anxiety disorder: Secondary | ICD-10-CM

## 2014-09-10 MED ORDER — ALPRAZOLAM 0.5 MG PO TABS: 0.5000 mg | ORAL_TABLET | Freq: Two times a day (BID) | ORAL | Status: DC

## 2014-09-10 NOTE — Telephone Encounter (Signed)
Called in.

## 2014-09-10 NOTE — Telephone Encounter (Signed)
Last ov 09/06/14. Last refill 07/09/14. [facility]

## 2014-09-10 NOTE — Telephone Encounter (Signed)
Please call in xanax with 1 refills 

## 2014-09-13 ENCOUNTER — Encounter: Payer: Self-pay | Admitting: Family Medicine

## 2014-09-13 NOTE — Telephone Encounter (Signed)
Patient aware.

## 2014-09-18 ENCOUNTER — Other Ambulatory Visit: Payer: Self-pay | Admitting: Nurse Practitioner

## 2014-09-18 NOTE — Telephone Encounter (Signed)
Seen 09/06/14  MMM This med not on EPIC list

## 2014-11-02 ENCOUNTER — Other Ambulatory Visit: Payer: Self-pay | Admitting: Nurse Practitioner

## 2014-11-18 ENCOUNTER — Other Ambulatory Visit: Payer: Self-pay | Admitting: Nurse Practitioner

## 2014-11-18 DIAGNOSIS — I10 Essential (primary) hypertension: Secondary | ICD-10-CM

## 2014-11-18 DIAGNOSIS — F411 Generalized anxiety disorder: Secondary | ICD-10-CM

## 2014-11-18 DIAGNOSIS — E785 Hyperlipidemia, unspecified: Secondary | ICD-10-CM

## 2014-11-18 DIAGNOSIS — E1142 Type 2 diabetes mellitus with diabetic polyneuropathy: Secondary | ICD-10-CM

## 2014-11-18 MED ORDER — INSULIN GLARGINE 100 UNIT/ML ~~LOC~~ SOLN: 64.0000 [IU] | Freq: Every day | SUBCUTANEOUS | Status: DC

## 2014-11-18 MED ORDER — ALPRAZOLAM 0.5 MG PO TABS: 0.5000 mg | ORAL_TABLET | Freq: Two times a day (BID) | ORAL | Status: DC

## 2014-11-18 MED ORDER — INSULIN GLULISINE 100 UNIT/ML IJ SOLN: INTRAMUSCULAR | Status: DC

## 2014-12-03 ENCOUNTER — Other Ambulatory Visit: Payer: Self-pay | Admitting: Nurse Practitioner

## 2014-12-17 ENCOUNTER — Other Ambulatory Visit: Payer: Self-pay | Admitting: *Deleted

## 2014-12-17 MED ORDER — BLOOD GLUCOSE METER KIT: 1.0000 | PACK | Freq: Three times a day (TID) | Status: DC

## 2014-12-17 NOTE — Telephone Encounter (Signed)
Needs new prescription for Accucheck meter

## 2014-12-20 ENCOUNTER — Ambulatory Visit (INDEPENDENT_AMBULATORY_CARE_PROVIDER_SITE_OTHER): Payer: Medicare Other | Admitting: Nurse Practitioner

## 2014-12-20 ENCOUNTER — Encounter (INDEPENDENT_AMBULATORY_CARE_PROVIDER_SITE_OTHER): Payer: Self-pay

## 2014-12-20 ENCOUNTER — Encounter: Payer: Self-pay | Admitting: Nurse Practitioner

## 2014-12-20 VITALS — BP 121/68 | HR 106 | Temp 97.5°F | Ht 61.0 in | Wt 165.0 lb

## 2014-12-20 DIAGNOSIS — E785 Hyperlipidemia, unspecified: Secondary | ICD-10-CM

## 2014-12-20 DIAGNOSIS — E1140 Type 2 diabetes mellitus with diabetic neuropathy, unspecified: Secondary | ICD-10-CM

## 2014-12-20 DIAGNOSIS — I10 Essential (primary) hypertension: Secondary | ICD-10-CM

## 2014-12-20 DIAGNOSIS — E1142 Type 2 diabetes mellitus with diabetic polyneuropathy: Secondary | ICD-10-CM

## 2014-12-20 LAB — GLUCOSE, POCT (MANUAL RESULT ENTRY): POC Glucose: 126 mg/dl — AB (ref 70–99)

## 2014-12-20 LAB — POCT GLYCOSYLATED HEMOGLOBIN (HGB A1C): HEMOGLOBIN A1C: 12.2

## 2014-12-20 MED ORDER — FUROSEMIDE 20 MG PO TABS: ORAL_TABLET | ORAL | Status: DC

## 2014-12-20 MED ORDER — GABAPENTIN 300 MG PO CAPS: 300.0000 mg | ORAL_CAPSULE | Freq: Every day | ORAL | Status: DC

## 2014-12-20 MED ORDER — INSULIN GLARGINE 100 UNIT/ML ~~LOC~~ SOLN: 70.0000 [IU] | Freq: Every day | SUBCUTANEOUS | Status: DC

## 2014-12-20 NOTE — Progress Notes (Signed)
Subjective:    Patient ID: Kylie Ward, female    DOB: [birth year], 69 y.o.   MRN: [id number]  Patient here today for Chronic disease follow up. No acute complains.   Diabetes She presents for her follow-up diabetic visit. She has type 2 diabetes mellitus. Pertinent negatives for hypoglycemia include no confusion or dizziness. Associated symptoms include visual change ( needs new glasses). Pertinent negatives for diabetes include no chest pain, no polydipsia and no polyphagia. Diabetic complications include nephropathy (feet). Risk factors for coronary artery disease include dyslipidemia, hypertension and post-menopausal. Current diabetic treatment includes insulin injections and oral agent (monotherapy). She is compliant with treatment most of the time. Her weight is stable. She has not had a previous visit with a dietitian. She rarely participates in exercise. Her breakfast blood glucose is taken between 9-10 am. Her breakfast blood glucose range is generally 130-140 mg/dl. Her lunch blood glucose is taken between 2-3 pm. Her lunch blood glucose range is generally 140-180 mg/dl. Her dinner blood glucose is taken between 7-8 pm. Her dinner blood glucose range is generally 140-180 mg/dl. Her highest blood glucose is >200 mg/dl. Her overall blood glucose range is 140-180 mg/dl. An ACE inhibitor/angiotensin II receptor blocker is being taken. She does not see a podiatrist.Eye exam is not current.  Hyperlipidemia This is a chronic problem. The current episode started more than 1 year ago. The problem is uncontrolled. Recent lipid tests were reviewed and are high. Exacerbating diseases include diabetes and obesity. Pertinent negatives include no chest pain, myalgias or shortness of breath. Current antihyperlipidemic treatment includes statins. The current treatment provides moderate improvement of lipids. Compliance problems include adherence to diet and adherence to exercise.  Risk factors for coronary  artery disease include diabetes mellitus, hypertension, obesity and post-menopausal.  Hypertension This is a chronic problem. The current episode started more than 1 year ago. The problem is controlled. Pertinent negatives include no chest pain or shortness of breath. Risk factors for coronary artery disease include diabetes mellitus, dyslipidemia, obesity and post-menopausal state. Past treatments include diuretics and ACE inhibitors. The current treatment provides moderate improvement. Compliance problems include diet and exercise.   GAD Xanax .5 as needed- keeps her calm Neuropathy feet Patient currently taking neurotin daily which helps some with burning sensation.       Review of Systems  Constitutional: Negative.   HENT: Negative.   Respiratory: Negative.  Negative for shortness of breath.   Cardiovascular: Negative.  Negative for chest pain.  Gastrointestinal: Negative.   Endocrine: Negative.  Negative for polydipsia and polyphagia.  Genitourinary: Negative.   Musculoskeletal: Negative.  Negative for myalgias.  Neurological: Positive for numbness. Negative for dizziness.  Psychiatric/Behavioral: Negative for confusion.       Objective:   Physical Exam  Constitutional: She is oriented to person, place, and time. She appears well-developed and well-nourished.  HENT:  Nose: Nose normal.  Mouth/Throat: Oropharynx is clear and moist.  Eyes: EOM are normal.  Neck: Trachea normal, normal range of motion and full passive range of motion without pain. Neck supple. No JVD present. Carotid bruit is not present. No thyromegaly present.  Cardiovascular: Normal rate, regular rhythm, normal heart sounds and intact distal pulses.  Exam reveals no gallop and no friction rub.   No murmur heard. Pulmonary/Chest: Effort normal and breath sounds normal.  Abdominal: Soft. Bowel sounds are normal. She exhibits no distension and no mass. There is no tenderness.  Musculoskeletal: Normal range of  motion.  Lymphadenopathy:  She has no cervical adenopathy.  Neurological: She is alert and oriented to person, place, and time. She has normal reflexes.  Skin: Skin is warm and dry.  Psychiatric: She has a normal mood and affect. Her behavior is normal. Judgment and thought content normal.   BP 121/68 mmHg  Pulse 106  Temp(Src) 97.5 F (36.4 C) (Oral)  Ht _0  (1.549 m)  Wt 165 lb (74.844 kg)  BMI 31.19 kg/m2  LMP 01/27/1983  Results for orders placed or performed in visit on 12/20/14  POCT glycosylated hemoglobin (Hb A1C)  Result Value Ref Range   Hemoglobin A1C 12.2%   POCT glucose (manual entry)  Result Value Ref Range   POC Glucose 126 (A) 70 - 99 mg/dl         Assessment & Plan:    1. Essential hypertension, benign Do not add salt todiet - CMP14+EGFR - furosemide (LASIX) 20 MG tablet; TAKE 1 TABLET (20 MG TOTAL) BY MOUTH DAILY.  Dispense: 90 tablet; Refill: 1  2. Type 2 diabetes mellitus with diabetic polyneuropathy Carb counting Increase lantus to 70 u ( 67u for 3 days then 70U) - POCT glycosylated hemoglobin (Hb A1C) - POCT glucose (manual entry) - insulin glargine (LANTUS) 100 UNIT/ML injection; Inject 0.7 mLs (70 Units total) into the skin at bedtime.  Dispense: 30 mL; Refill: 5  3. Hyperlipidemia with target LDL less than 100 Low fat diet - NMR, lipoprofile  4. Diabetic neuropathy, type II diabetes mellitus - gabapentin (NEURONTIN) 300 MG capsule; Take 1 capsule (300 mg total) by mouth at bedtime.  Dispense: 90 capsule; Refill: 1   Make appointment to see clinical pharmacist Labs pending Health maintenance reviewed Diet and exercise encouraged Continue all meds Follow up  In 3 months   [place], FNP

## 2014-12-20 NOTE — Patient Instructions (Signed)

## 2014-12-21 LAB — NMR, LIPOPROFILE
CHOLESTEROL: 200 mg/dL — AB (ref 100–199)
HDL Cholesterol by NMR: 36 mg/dL — ABNORMAL LOW (ref >39)
HDL PARTICLE NUMBER: 32.5 umol/L (ref >=30.5)
LDL Particle Number: 1199 nmol/L — ABNORMAL HIGH (ref <1000)
LDL Size: 19.9 nm (ref >20.5)
LDL-C: 94 mg/dL (ref 0–99)
LP-IR Score: 60 — ABNORMAL HIGH (ref <=45)
Small LDL Particle Number: 846 nmol/L — ABNORMAL HIGH (ref <=527)
Triglycerides by NMR: 349 mg/dL — ABNORMAL HIGH (ref 0–149)

## 2014-12-21 LAB — CMP14+EGFR
ALBUMIN: 4.1 g/dL (ref 3.6–4.8)
ALT: 16 IU/L (ref 0–32)
AST: 14 IU/L (ref 0–40)
Albumin/Globulin Ratio: 1.8 (ref 1.1–2.5)
Alkaline Phosphatase: 101 IU/L (ref 39–117)
BILIRUBIN TOTAL: 0.3 mg/dL (ref 0.0–1.2)
BUN/Creatinine Ratio: 18 (ref 11–26)
BUN: 17 mg/dL (ref 8–27)
CHLORIDE: 98 mmol/L (ref 97–108)
CO2: 22 mmol/L (ref 18–29)
Calcium: 9.5 mg/dL (ref 8.7–10.3)
Creatinine, Ser: 0.95 mg/dL (ref 0.57–1.00)
GFR, EST AFRICAN AMERICAN: 72 mL/min/{1.73_m2} (ref >59)
GFR, EST NON AFRICAN AMERICAN: 63 mL/min/{1.73_m2} (ref >59)
Globulin, Total: 2.3 g/dL (ref 1.5–4.5)
Glucose: 116 mg/dL — ABNORMAL HIGH (ref 65–99)
Potassium: 4.8 mmol/L (ref 3.5–5.2)
SODIUM: 141 mmol/L (ref 134–144)
TOTAL PROTEIN: 6.4 g/dL (ref 6.0–8.5)

## 2014-12-25 ENCOUNTER — Ambulatory Visit: Payer: Medicare Other

## 2014-12-26 ENCOUNTER — Ambulatory Visit: Payer: Medicare Other

## 2015-03-17 DIAGNOSIS — I6523 Occlusion and stenosis of bilateral carotid arteries: Secondary | ICD-10-CM | POA: Diagnosis not present

## 2015-03-17 DIAGNOSIS — I639 Cerebral infarction, unspecified: Secondary | ICD-10-CM | POA: Diagnosis not present

## 2015-03-17 DIAGNOSIS — I6522 Occlusion and stenosis of left carotid artery: Secondary | ICD-10-CM | POA: Diagnosis not present

## 2015-04-19 ENCOUNTER — Other Ambulatory Visit: Payer: Self-pay | Admitting: Nurse Practitioner

## 2015-04-21 ENCOUNTER — Telehealth: Payer: Self-pay | Admitting: Nurse Practitioner

## 2015-04-21 NOTE — Telephone Encounter (Signed)
Last seen 12/20/14  MMM If approved route to nurse to call into [facility]

## 2015-04-21 NOTE — Telephone Encounter (Signed)
Please call in xanax with 1 refills Patient NTBS for follow up and lab work   

## 2015-04-21 NOTE — Telephone Encounter (Signed)
Left refill authorization on voicemail 

## 2015-05-02 ENCOUNTER — Telehealth: Payer: Self-pay | Admitting: Nurse Practitioner

## 2015-05-02 ENCOUNTER — Other Ambulatory Visit: Payer: Self-pay | Admitting: *Deleted

## 2015-05-02 ENCOUNTER — Other Ambulatory Visit: Payer: Self-pay | Admitting: Nurse Practitioner

## 2015-05-02 MED ORDER — INSULIN GLULISINE 100 UNIT/ML IJ SOLN: INTRAMUSCULAR | Status: DC

## 2015-05-02 MED ORDER — INSULIN LISPRO 100 UNIT/ML ~~LOC~~ SOLN: SUBCUTANEOUS | Status: DC

## 2015-05-02 NOTE — Telephone Encounter (Signed)
Please advise 

## 2015-05-02 NOTE — Telephone Encounter (Signed)
Aware, refill on Apidra sent in.

## 2015-05-08 ENCOUNTER — Encounter: Payer: Self-pay | Admitting: Nurse Practitioner

## 2015-05-08 ENCOUNTER — Ambulatory Visit (INDEPENDENT_AMBULATORY_CARE_PROVIDER_SITE_OTHER): Payer: Medicare Other | Admitting: Nurse Practitioner

## 2015-05-08 VITALS — BP 122/80 | HR 98 | Temp 97.6°F | Ht 61.0 in | Wt 165.0 lb

## 2015-05-08 DIAGNOSIS — E785 Hyperlipidemia, unspecified: Secondary | ICD-10-CM | POA: Diagnosis not present

## 2015-05-08 DIAGNOSIS — I10 Essential (primary) hypertension: Secondary | ICD-10-CM

## 2015-05-08 DIAGNOSIS — E1140 Type 2 diabetes mellitus with diabetic neuropathy, unspecified: Secondary | ICD-10-CM | POA: Diagnosis not present

## 2015-05-08 DIAGNOSIS — N3000 Acute cystitis without hematuria: Secondary | ICD-10-CM | POA: Diagnosis not present

## 2015-05-08 DIAGNOSIS — Z6831 Body mass index (BMI) 31.0-31.9, adult: Secondary | ICD-10-CM | POA: Diagnosis not present

## 2015-05-08 DIAGNOSIS — E1142 Type 2 diabetes mellitus with diabetic polyneuropathy: Secondary | ICD-10-CM

## 2015-05-08 DIAGNOSIS — R300 Dysuria: Secondary | ICD-10-CM | POA: Diagnosis not present

## 2015-05-08 LAB — POCT URINALYSIS DIPSTICK
Bilirubin, UA: NEGATIVE
Glucose, UA: NEGATIVE
KETONES UA: NEGATIVE
Nitrite, UA: POSITIVE
Urobilinogen, UA: NEGATIVE
pH, UA: 5

## 2015-05-08 LAB — POCT UA - MICROSCOPIC ONLY
Casts, Ur, LPF, POC: NEGATIVE
Crystals, Ur, HPF, POC: NEGATIVE
Mucus, UA: NEGATIVE
YEAST UA: NEGATIVE

## 2015-05-08 LAB — POCT GLYCOSYLATED HEMOGLOBIN (HGB A1C): HEMOGLOBIN A1C: 11.1

## 2015-05-08 LAB — GLUCOSE, POCT (MANUAL RESULT ENTRY): POC Glucose: 227 mg/dl — AB (ref 70–99)

## 2015-05-08 MED ORDER — LISINOPRIL-HYDROCHLOROTHIAZIDE 20-25 MG PO TABS: 1.0000 | ORAL_TABLET | Freq: Every day | ORAL | Status: DC

## 2015-05-08 MED ORDER — EZETIMIBE 10 MG PO TABS: 10.0000 mg | ORAL_TABLET | Freq: Every day | ORAL | Status: DC

## 2015-05-08 MED ORDER — CIPROFLOXACIN HCL 500 MG PO TABS: 500.0000 mg | ORAL_TABLET | Freq: Two times a day (BID) | ORAL | Status: DC

## 2015-05-08 MED ORDER — FUROSEMIDE 20 MG PO TABS: ORAL_TABLET | ORAL | Status: DC

## 2015-05-08 MED ORDER — GABAPENTIN 300 MG PO CAPS: 300.0000 mg | ORAL_CAPSULE | Freq: Every day | ORAL | Status: DC

## 2015-05-08 MED ORDER — METFORMIN HCL ER 750 MG PO TB24: 750.0000 mg | ORAL_TABLET | Freq: Every day | ORAL | Status: DC

## 2015-05-08 MED ORDER — SIMVASTATIN 80 MG PO TABS: 80.0000 mg | ORAL_TABLET | Freq: Every day | ORAL | Status: DC

## 2015-05-08 NOTE — Patient Instructions (Signed)

## 2015-05-08 NOTE — Progress Notes (Signed)
Subjective:    Patient ID: Kylie Ward, female    DOB: 03-07-1946, 69 y.o.   MRN: [id number]  Patient here today for Chronic disease follow up. No acute complains.   Dysuria  This is a new problem. The current episode started 1 to 4 weeks ago. The problem occurs intermittently. The problem has been unchanged. The quality of the pain is described as burning. The pain is at a severity of 4/10. The pain is moderate. She is not sexually active. There is no history of pyelonephritis. Associated symptoms include frequency and urgency. Pertinent negatives include no chills, flank pain or hematuria. She has tried nothing for the symptoms. The treatment provided mild relief.  Diabetes She presents for her follow-up diabetic visit. She has type 2 diabetes mellitus. Pertinent negatives for hypoglycemia include no confusion or dizziness. Associated symptoms include visual change ( needs new glasses). Pertinent negatives for diabetes include no chest pain, no polydipsia and no polyphagia. Diabetic complications include nephropathy (feet). Risk factors for coronary artery disease include dyslipidemia, hypertension and post-menopausal. Current diabetic treatment includes insulin injections and oral agent (monotherapy). She is compliant with treatment most of the time. Her weight is stable. She has not had a previous visit with a dietitian. She rarely participates in exercise. Her breakfast blood glucose is taken between 9-10 am. Her breakfast blood glucose range is generally 130-140 mg/dl. Her lunch blood glucose is taken between 2-3 pm. Her lunch blood glucose range is generally 140-180 mg/dl. Her dinner blood glucose is taken between 7-8 pm. Her dinner blood glucose range is generally 140-180 mg/dl. Her highest blood glucose is >200 mg/dl. Her overall blood glucose range is 140-180 mg/dl. An ACE inhibitor/angiotensin II receptor blocker is being taken. She does not see a podiatrist.Eye exam is not current.   Hyperlipidemia This is a chronic problem. The current episode started more than 1 year ago. The problem is uncontrolled. Recent lipid tests were reviewed and are high. Exacerbating diseases include diabetes and obesity. Pertinent negatives include no chest pain, myalgias or shortness of breath. Current antihyperlipidemic treatment includes statins. The current treatment provides moderate improvement of lipids. Compliance problems include adherence to diet and adherence to exercise.  Risk factors for coronary artery disease include diabetes mellitus, hypertension, obesity and post-menopausal.  Hypertension This is a chronic problem. The current episode started more than 1 year ago. The problem is controlled. Pertinent negatives include no chest pain or shortness of breath. Risk factors for coronary artery disease include diabetes mellitus, dyslipidemia, obesity and post-menopausal state. Past treatments include diuretics and ACE inhibitors. The current treatment provides moderate improvement. Compliance problems include diet and exercise.   GAD Xanax .5 as needed- keeps her calm Neuropathy feet Patient currently taking neurotin daily which helps some with burning sensation.       Review of Systems  Constitutional: Negative.  Negative for chills.  HENT: Negative.   Respiratory: Negative.  Negative for shortness of breath.   Cardiovascular: Negative.  Negative for chest pain.  Gastrointestinal: Negative.   Endocrine: Negative.  Negative for polydipsia and polyphagia.  Genitourinary: Positive for dysuria, urgency and frequency. Negative for hematuria and flank pain.  Musculoskeletal: Negative.  Negative for myalgias.  Neurological: Positive for numbness. Negative for dizziness.  Psychiatric/Behavioral: Negative for confusion.       Objective:   Physical Exam  Constitutional: She is oriented to person, place, and time. She appears well-developed and well-nourished.  HENT:  Nose: Nose  normal.  Mouth/Throat: Oropharynx is clear and  moist.  Eyes: EOM are normal.  Neck: Trachea normal, normal range of motion and full passive range of motion without pain. Neck supple. No JVD present. Carotid bruit is not present. No thyromegaly present.  Cardiovascular: Normal rate, regular rhythm, normal heart sounds and intact distal pulses.  Exam reveals no gallop and no friction rub.   No murmur heard. Pulmonary/Chest: Effort normal and breath sounds normal.  Abdominal: Soft. Bowel sounds are normal. She exhibits no distension and no mass. There is no tenderness.  Musculoskeletal: Normal range of motion.  Lymphadenopathy:    She has no cervical adenopathy.  Neurological: She is alert and oriented to person, place, and time. She has normal reflexes.  Skin: Skin is warm and dry.  Psychiatric: She has a normal mood and affect. Her behavior is normal. Judgment and thought content normal.   BP 122/80 mmHg  Pulse 98  Temp(Src) 97.6 F (36.4 C) (Oral)  Ht $R'5\' 1"'tq$  (1.549 m)  Wt 165 lb (74.844 kg)  BMI 31.19 kg/m2  LMP 01/27/1983   Results for orders placed or performed in visit on 05/08/15  POCT UA - Microscopic Only  Result Value Ref Range   WBC, Ur, HPF, POC 40-80    RBC, urine, microscopic 5-10    Bacteria, U Microscopic many    Mucus, UA negative    Epithelial cells, urine per micros few    Crystals, Ur, HPF, POC negative    Casts, Ur, LPF, POC negative    Yeast, UA negative   POCT urinalysis dipstick  Result Value Ref Range   Color, UA pale yellow    Clarity, UA cloudy    Glucose, UA negative    Bilirubin, UA negative    Ketones, UA negative    Spec Grav, UA <=1.005    Blood, UA moderate    pH, UA 5.0    Protein, UA 2+    Urobilinogen, UA negative    Nitrite, UA positive    Leukocytes, UA large (3+) (A) Negative  POCT glycosylated hemoglobin (Hb A1C)  Result Value Ref Range   Hemoglobin A1C 11.1         Assessment & Plan:   1. Dysuria - POCT UA - Microscopic  Only - POCT urinalysis dipstick - Urine culture  2. Acute cystitis without hematuria Take medication as prescribe Cotton underwear Take shower not bath Cranberry juice, yogurt Force fluids AZO over the counter X2 days Culture pending RTO prn - ciprofloxacin (CIPRO) 500 MG tablet; Take 1 tablet (500 mg total) by mouth 2 (two) times daily.  Dispense: 6 tablet; Refill: 0  3. Essential hypertension, benign Do nit ad salt to diet - CMP14+EGFR - lisinopril-hydrochlorothiazide (PRINZIDE,ZESTORETIC) 20-25 MG per tablet; Take 1 tablet by mouth daily.  Dispense: 90 tablet; Refill: 1 - furosemide (LASIX) 20 MG tablet; TAKE 1 TABLET (20 MG TOTAL) BY MOUTH DAILY.  Dispense: 90 tablet; Refill: 1  4. Type 2 diabetes mellitus with diabetic polyneuropathy Stricter carb counting and exercise Refuses to change medications for noew Keep diary of blood sugars - POCT glycosylated hemoglobin (Hb A1C) - POCT glucose (manual entry) - metFORMIN (GLUCOPHAGE-XR) 750 MG 24 hr tablet; Take 1 tablet (750 mg total) by mouth daily with breakfast.  Dispense: 90 tablet; Refill: 1  5. Hyperlipidemia with target LDL less than 100 Low fat diet - Lipid panel - simvastatin (ZOCOR) 80 MG tablet; Take 1 tablet (80 mg total) by mouth daily.  Dispense: 90 tablet; Refill: 1 - ezetimibe (ZETIA) 10 MG  tablet; Take 1 tablet (10 mg total) by mouth daily.  Dispense: 90 tablet; Refill: 1  6. Diabetic neuropathy, type II diabetes mellitus - gabapentin (NEURONTIN) 300 MG capsule; Take 1 capsule (300 mg total) by mouth at bedtime.  Dispense: 90 capsule; Refill: 1  7. BMI 31.0-31.9,adult Discussed diet and exercise for person with BMI >25 Will recheck weight in 3-6 months     Labs pending Health maintenance reviewed Diet and exercise encouraged Continue all meds Follow up  In 3 months   [place], FNP

## 2015-05-09 LAB — LIPID PANEL
Chol/HDL Ratio: 6.2 ratio units — ABNORMAL HIGH (ref 0.0–4.4)
Cholesterol, Total: 217 mg/dL — ABNORMAL HIGH (ref 100–199)
HDL: 35 mg/dL — AB (ref >39)
TRIGLYCERIDES: 442 mg/dL — AB (ref 0–149)

## 2015-05-09 LAB — CMP14+EGFR
ALBUMIN: 4.2 g/dL (ref 3.6–4.8)
ALT: 24 IU/L (ref 0–32)
AST: 17 IU/L (ref 0–40)
Albumin/Globulin Ratio: 2 (ref 1.1–2.5)
Alkaline Phosphatase: 97 IU/L (ref 39–117)
BILIRUBIN TOTAL: 0.3 mg/dL (ref 0.0–1.2)
BUN/Creatinine Ratio: 19 (ref 11–26)
BUN: 15 mg/dL (ref 8–27)
CO2: 26 mmol/L (ref 18–29)
Calcium: 9.2 mg/dL (ref 8.7–10.3)
Chloride: 97 mmol/L (ref 97–108)
Creatinine, Ser: 0.79 mg/dL (ref 0.57–1.00)
GFR calc Af Amer: 90 mL/min/{1.73_m2} (ref >59)
GFR calc non Af Amer: 78 mL/min/{1.73_m2} (ref >59)
GLUCOSE: 229 mg/dL — AB (ref 65–99)
Globulin, Total: 2.1 g/dL (ref 1.5–4.5)
POTASSIUM: 5 mmol/L (ref 3.5–5.2)
Sodium: 139 mmol/L (ref 134–144)
TOTAL PROTEIN: 6.3 g/dL (ref 6.0–8.5)

## 2015-05-09 NOTE — Telephone Encounter (Signed)
-----   Message from Bennie PieriniMary-Margaret Martin, FNP sent at 05/09/2015 11:39 AM EDT ----- blood sugar discussed at appointment Hgba1c discussed at appointment Kidney and liver function stable Trig are elevated - should improve with diabetic control- do not know what ldl are due to trig being so high Urine treated at appointment Continue current meds- low fat diet and exercise and recheck in 3 months Patient told to be on strict low carb diet- refused medication changes to diabetes at appointment

## 2015-05-10 LAB — URINE CULTURE

## 2015-05-15 ENCOUNTER — Telehealth: Payer: Self-pay | Admitting: Nurse Practitioner

## 2015-05-15 NOTE — Telephone Encounter (Signed)
Husband will tell her to come in for another urine specimen to be checked.

## 2015-05-15 NOTE — Telephone Encounter (Signed)
Was given cipro and culture says that was med to treat if if no better need to repeat urine

## 2015-05-16 ENCOUNTER — Other Ambulatory Visit (INDEPENDENT_AMBULATORY_CARE_PROVIDER_SITE_OTHER): Payer: Medicare Other

## 2015-05-16 DIAGNOSIS — R300 Dysuria: Secondary | ICD-10-CM

## 2015-05-16 LAB — POCT UA - MICROSCOPIC ONLY
CASTS, UR, LPF, POC: NEGATIVE
Crystals, Ur, HPF, POC: NEGATIVE
Mucus, UA: NEGATIVE
Yeast, UA: NEGATIVE

## 2015-05-16 LAB — POCT URINALYSIS DIPSTICK
BILIRUBIN UA: NEGATIVE
Nitrite, UA: NEGATIVE
Spec Grav, UA: 1.015
Urobilinogen, UA: NEGATIVE
pH, UA: 5

## 2015-05-17 ENCOUNTER — Telehealth: Payer: Self-pay | Admitting: Nurse Practitioner

## 2015-05-18 LAB — URINE CULTURE

## 2015-05-19 ENCOUNTER — Other Ambulatory Visit: Payer: Self-pay | Admitting: Nurse Practitioner

## 2015-05-19 MED ORDER — AMOXICILLIN 875 MG PO TABS: 875.0000 mg | ORAL_TABLET | Freq: Two times a day (BID) | ORAL | Status: DC

## 2015-05-19 NOTE — Telephone Encounter (Signed)
Patient aware of results.

## 2015-07-04 ENCOUNTER — Other Ambulatory Visit: Payer: Self-pay | Admitting: Nurse Practitioner

## 2015-07-18 ENCOUNTER — Other Ambulatory Visit: Payer: Self-pay | Admitting: Nurse Practitioner

## 2015-08-12 ENCOUNTER — Ambulatory Visit (INDEPENDENT_AMBULATORY_CARE_PROVIDER_SITE_OTHER): Payer: Medicare Other | Admitting: Nurse Practitioner

## 2015-08-12 ENCOUNTER — Encounter: Payer: Self-pay | Admitting: Nurse Practitioner

## 2015-08-12 VITALS — Temp 96.8°F | Ht 61.0 in | Wt 169.0 lb

## 2015-08-12 DIAGNOSIS — E785 Hyperlipidemia, unspecified: Secondary | ICD-10-CM

## 2015-08-12 DIAGNOSIS — R319 Hematuria, unspecified: Secondary | ICD-10-CM | POA: Diagnosis not present

## 2015-08-12 DIAGNOSIS — Z6831 Body mass index (BMI) 31.0-31.9, adult: Secondary | ICD-10-CM

## 2015-08-12 DIAGNOSIS — E1140 Type 2 diabetes mellitus with diabetic neuropathy, unspecified: Secondary | ICD-10-CM | POA: Diagnosis not present

## 2015-08-12 DIAGNOSIS — I10 Essential (primary) hypertension: Secondary | ICD-10-CM

## 2015-08-12 DIAGNOSIS — F411 Generalized anxiety disorder: Secondary | ICD-10-CM | POA: Insufficient documentation

## 2015-08-12 DIAGNOSIS — Z23 Encounter for immunization: Secondary | ICD-10-CM

## 2015-08-12 DIAGNOSIS — N3001 Acute cystitis with hematuria: Secondary | ICD-10-CM | POA: Diagnosis not present

## 2015-08-12 LAB — POCT URINALYSIS DIPSTICK
BILIRUBIN UA: NEGATIVE
GLUCOSE UA: NEGATIVE
Ketones, UA: NEGATIVE
Nitrite, UA: POSITIVE
Protein, UA: NEGATIVE
SPEC GRAV UA: 1.02
UROBILINOGEN UA: NEGATIVE
pH, UA: 5

## 2015-08-12 LAB — POCT UA - MICROSCOPIC ONLY
CASTS, UR, LPF, POC: NEGATIVE
Crystals, Ur, HPF, POC: NEGATIVE
MUCUS UA: NEGATIVE

## 2015-08-12 LAB — POCT UA - MICROALBUMIN: Microalbumin Ur, POC: 20 mg/L

## 2015-08-12 LAB — POCT GLYCOSYLATED HEMOGLOBIN (HGB A1C): Hemoglobin A1C: 11.7

## 2015-08-12 MED ORDER — CIPROFLOXACIN HCL 500 MG PO TABS: 500.0000 mg | ORAL_TABLET | Freq: Two times a day (BID) | ORAL | Status: DC

## 2015-08-12 MED ORDER — ALPRAZOLAM 0.5 MG PO TABS: 0.5000 mg | ORAL_TABLET | Freq: Two times a day (BID) | ORAL | Status: DC

## 2015-08-12 MED ORDER — METFORMIN HCL ER 750 MG PO TB24: 750.0000 mg | ORAL_TABLET | Freq: Every day | ORAL | Status: DC

## 2015-08-12 NOTE — Patient Instructions (Signed)

## 2015-08-12 NOTE — Progress Notes (Signed)
Subjective:    Patient ID: Kylie Ward, female    DOB: [birth year], 69 y.o.   MRN: [id number]  Patient here today for Chronic disease follow up. Husband said that patient has changed her diet and is watching carbs but blood sugars seem to be staying high- has had some dysuria and hematuria the last several days.  Diabetes She presents for her follow-up diabetic visit. She has type 2 diabetes mellitus. Pertinent negatives for hypoglycemia include no confusion or dizziness. Associated symptoms include visual change ( needs new glasses). Pertinent negatives for diabetes include no chest pain, no polydipsia and no polyphagia. Diabetic complications include nephropathy (feet). Risk factors for coronary artery disease include dyslipidemia, hypertension and post-menopausal. Current diabetic treatment includes insulin injections and oral agent (monotherapy). She is compliant with treatment most of the time. Her weight is stable. She has not had a previous visit with a dietitian. She rarely participates in exercise. Her breakfast blood glucose is taken between 9-10 am. Her breakfast blood glucose range is generally 130-140 mg/dl. Her lunch blood glucose is taken between 2-3 pm. Her lunch blood glucose range is generally 140-180 mg/dl. Her dinner blood glucose is taken between 7-8 pm. Her dinner blood glucose range is generally 140-180 mg/dl. Her highest blood glucose is >200 mg/dl. Her overall blood glucose range is 140-180 mg/dl. An ACE inhibitor/angiotensin II receptor blocker is being taken. She does not see a podiatrist.Eye exam is not current.  Hyperlipidemia This is a chronic problem. The current episode started more than 1 year ago. The problem is uncontrolled. Recent lipid tests were reviewed and are high. Exacerbating diseases include diabetes and obesity. Pertinent negatives include no chest pain, myalgias or shortness of breath. Current antihyperlipidemic treatment includes statins. The current  treatment provides moderate improvement of lipids. Compliance problems include adherence to diet and adherence to exercise.  Risk factors for coronary artery disease include diabetes mellitus, hypertension, obesity and post-menopausal.  Hypertension This is a chronic problem. The current episode started more than 1 year ago. The problem is controlled. Pertinent negatives include no chest pain or shortness of breath. Risk factors for coronary artery disease include diabetes mellitus, dyslipidemia, obesity and post-menopausal state. Past treatments include diuretics and ACE inhibitors. The current treatment provides moderate improvement. Compliance problems include diet and exercise.   GAD Xanax .5 as needed- keeps her calm Neuropathy feet Patient currently taking neurotin daily which helps some with burning sensation.       Review of Systems  Constitutional: Negative.   HENT: Negative.   Respiratory: Negative.  Negative for shortness of breath.   Cardiovascular: Negative.  Negative for chest pain.  Gastrointestinal: Negative.   Endocrine: Negative.  Negative for polydipsia and polyphagia.  Genitourinary: Negative.   Musculoskeletal: Negative.  Negative for myalgias.  Neurological: Positive for numbness. Negative for dizziness.  Psychiatric/Behavioral: Negative for confusion.       Objective:   Physical Exam  Constitutional: She is oriented to person, place, and time. She appears well-developed and well-nourished.  HENT:  Nose: Nose normal.  Mouth/Throat: Oropharynx is clear and moist.  Eyes: EOM are normal.  Neck: Trachea normal, normal range of motion and full passive range of motion without pain. Neck supple. No JVD present. Carotid bruit is not present. No thyromegaly present.  Cardiovascular: Normal rate, regular rhythm, normal heart sounds and intact distal pulses.  Exam reveals no gallop and no friction rub.   No murmur heard. Pulmonary/Chest: Effort normal and breath sounds  normal.  Abdominal: Soft.  Bowel sounds are normal. She exhibits no distension and no mass. There is no tenderness.  Musculoskeletal: Normal range of motion.  Lymphadenopathy:    She has no cervical adenopathy.  Neurological: She is alert and oriented to person, place, and time. She has normal reflexes.  Skin: Skin is warm and dry.  Psychiatric: She has a normal mood and affect. Her behavior is normal. Judgment and thought content normal.   Temp(Src) 96.8 F (36 C) (Oral)  Ht '5\' 1"'  (1.549 m)  Wt 169 lb (76.658 kg)  BMI 31.95 kg/m2  LMP 01/27/1983  Results for orders placed or performed in visit on 08/12/15  POCT glycosylated hemoglobin (Hb A1C)  Result Value Ref Range   Hemoglobin A1C 11.7   POCT UA - Microalbumin  Result Value Ref Range   Microalbumin Ur, POC 20 mg/L           Assessment & Plan:  1. Essential hypertension, benign Do not add salt t o diet - CMP14+EGFR  2. Hyperlipidemia with target LDL less than 100 Low fat diet - Lipid panel  3. BMI 31.0-31.9,adult Discussed diet and exercise for person with BMI >25 Will recheck weight in 3-6 months   4. Type 2 diabetes mellitus with diabetic neuropathy, without long-term current use of insulin (HCC) Carb counting encouraged Increase lantus to 70 units daily Make sure doing sliding scale with all 3 meals Need to see T. Eckard - clinical pharmacist  5. Hematuria - POCT UA - Microscopic Only - POCT urinalysis dipstick  6. Acute cystitis with hematuria Take medication as prescribe Cotton underwear Take shower not bath Cranberry juice, yogurt Force fluids AZO over the counter X2 days Culture pending RTO prn - Urine culture - ciprofloxacin (CIPRO) 500 MG tablet; Take 1 tablet (500 mg total) by mouth 2 (two) times daily.  Dispense: 10 tablet; Refill: 0  7. GAD (generalized anxiety disorder) Stress management - ALPRAZolam (XANAX) 0.5 MG tablet; Take 1 tablet (0.5 mg total) by mouth 2 (two) times daily.   Dispense: 60 tablet; Refill: 1    Labs pending Health maintenance reviewed Diet and exercise encouraged Continue all meds Follow up  In 3 month   [place], FNP

## 2015-08-13 LAB — LIPID PANEL
CHOL/HDL RATIO: 5.8 ratio — AB (ref 0.0–4.4)
Cholesterol, Total: 190 mg/dL (ref 100–199)
HDL: 33 mg/dL — ABNORMAL LOW (ref >39)
Triglycerides: 446 mg/dL — ABNORMAL HIGH (ref 0–149)

## 2015-08-13 LAB — CMP14+EGFR
ALBUMIN: 4.1 g/dL (ref 3.6–4.8)
ALK PHOS: 102 IU/L (ref 39–117)
ALT: 21 IU/L (ref 0–32)
AST: 15 IU/L (ref 0–40)
Albumin/Globulin Ratio: 1.9 (ref 1.1–2.5)
BUN / CREAT RATIO: 30 — AB (ref 11–26)
BUN: 24 mg/dL (ref 8–27)
Bilirubin Total: 0.3 mg/dL (ref 0.0–1.2)
CO2: 24 mmol/L (ref 18–29)
CREATININE: 0.81 mg/dL (ref 0.57–1.00)
Calcium: 9.4 mg/dL (ref 8.7–10.3)
Chloride: 98 mmol/L (ref 97–108)
GFR calc non Af Amer: 75 mL/min/{1.73_m2} (ref >59)
GFR, EST AFRICAN AMERICAN: 87 mL/min/{1.73_m2} (ref >59)
GLOBULIN, TOTAL: 2.2 g/dL (ref 1.5–4.5)
GLUCOSE: 136 mg/dL — AB (ref 65–99)
Potassium: 4 mmol/L (ref 3.5–5.2)
SODIUM: 138 mmol/L (ref 134–144)
Total Protein: 6.3 g/dL (ref 6.0–8.5)

## 2015-08-13 LAB — MICROALBUMIN, URINE: Microalbumin, Urine: 5.1 ug/mL

## 2015-08-14 DIAGNOSIS — Z23 Encounter for immunization: Secondary | ICD-10-CM

## 2015-08-14 LAB — URINE CULTURE

## 2015-08-14 NOTE — Addendum Note (Signed)
Addended by: Cleda DaubUCKER, Seif Teichert G on: 08/14/2015 11:37 AM   Modules accepted: Orders

## 2015-08-15 ENCOUNTER — Ambulatory Visit: Payer: Medicare Other | Admitting: Pharmacist

## 2015-08-20 ENCOUNTER — Other Ambulatory Visit: Payer: Self-pay | Admitting: Nurse Practitioner

## 2015-08-20 NOTE — Telephone Encounter (Signed)
Please review and advise.

## 2015-08-21 NOTE — Telephone Encounter (Signed)
She is on HCTZ in blood pressure meds- what does she need

## 2015-08-25 ENCOUNTER — Telehealth: Payer: Self-pay | Admitting: Nurse Practitioner

## 2015-08-25 NOTE — Telephone Encounter (Signed)
Please give us more details so we can assist you.

## 2015-08-29 ENCOUNTER — Ambulatory Visit: Payer: Medicare Other | Admitting: Pharmacist

## 2015-08-29 ENCOUNTER — Telehealth: Payer: Self-pay | Admitting: Nurse Practitioner

## 2015-08-29 NOTE — Telephone Encounter (Signed)
patirnt aware to come pick up samples

## 2015-08-29 NOTE — Telephone Encounter (Signed)
[facility]

## 2015-10-07 ENCOUNTER — Telehealth: Payer: Self-pay | Admitting: Nurse Practitioner

## 2015-10-07 ENCOUNTER — Other Ambulatory Visit: Payer: Self-pay | Admitting: Nurse Practitioner

## 2015-10-07 NOTE — Telephone Encounter (Signed)
Pt wants [place] to send kit

## 2015-10-08 ENCOUNTER — Other Ambulatory Visit: Payer: Self-pay | Admitting: *Deleted

## 2015-10-08 DIAGNOSIS — Z1211 Encounter for screening for malignant neoplasm of colon: Secondary | ICD-10-CM

## 2015-10-08 NOTE — Telephone Encounter (Signed)
Done yesterday.

## 2015-10-11 ENCOUNTER — Telehealth: Payer: Self-pay | Admitting: *Deleted

## 2015-10-11 ENCOUNTER — Other Ambulatory Visit: Payer: Self-pay | Admitting: Nurse Practitioner

## 2015-10-11 DIAGNOSIS — E1142 Type 2 diabetes mellitus with diabetic polyneuropathy: Secondary | ICD-10-CM

## 2015-10-11 DIAGNOSIS — Z794 Long term (current) use of insulin: Principal | ICD-10-CM

## 2015-10-11 MED ORDER — INSULIN GLARGINE 100 UNIT/ML ~~LOC~~ SOLN: 70.0000 [IU] | Freq: Every day | SUBCUTANEOUS | Status: DC

## 2015-10-11 NOTE — Telephone Encounter (Signed)
Script expired for lantus.  [facility]  She is out of medication.

## 2015-10-11 NOTE — Telephone Encounter (Signed)
rx sent to pharmacy.patient aware. 

## 2015-10-31 ENCOUNTER — Other Ambulatory Visit: Payer: Self-pay | Admitting: Nurse Practitioner

## 2015-11-13 ENCOUNTER — Ambulatory Visit (INDEPENDENT_AMBULATORY_CARE_PROVIDER_SITE_OTHER): Payer: Medicare Other | Admitting: Nurse Practitioner

## 2015-11-13 ENCOUNTER — Encounter: Payer: Self-pay | Admitting: Nurse Practitioner

## 2015-11-13 DIAGNOSIS — R609 Edema, unspecified: Secondary | ICD-10-CM | POA: Diagnosis not present

## 2015-11-13 DIAGNOSIS — Z794 Long term (current) use of insulin: Secondary | ICD-10-CM | POA: Diagnosis not present

## 2015-11-13 DIAGNOSIS — F411 Generalized anxiety disorder: Secondary | ICD-10-CM | POA: Diagnosis not present

## 2015-11-13 DIAGNOSIS — E1140 Type 2 diabetes mellitus with diabetic neuropathy, unspecified: Secondary | ICD-10-CM

## 2015-11-13 DIAGNOSIS — E785 Hyperlipidemia, unspecified: Secondary | ICD-10-CM

## 2015-11-13 DIAGNOSIS — E1142 Type 2 diabetes mellitus with diabetic polyneuropathy: Secondary | ICD-10-CM | POA: Diagnosis not present

## 2015-11-13 DIAGNOSIS — Z6831 Body mass index (BMI) 31.0-31.9, adult: Secondary | ICD-10-CM | POA: Diagnosis not present

## 2015-11-13 DIAGNOSIS — I10 Essential (primary) hypertension: Secondary | ICD-10-CM

## 2015-11-13 LAB — POCT URINALYSIS DIPSTICK
BILIRUBIN UA: NEGATIVE
Glucose, UA: NEGATIVE
KETONES UA: NEGATIVE
Nitrite, UA: NEGATIVE
PROTEIN UA: NEGATIVE
UROBILINOGEN UA: NEGATIVE
pH, UA: 5

## 2015-11-13 LAB — POCT UA - MICROSCOPIC ONLY
CASTS, UR, LPF, POC: NEGATIVE
CRYSTALS, UR, HPF, POC: NEGATIVE
MUCUS UA: NEGATIVE
YEAST UA: NEGATIVE

## 2015-11-13 LAB — POCT GLYCOSYLATED HEMOGLOBIN (HGB A1C): Hemoglobin A1C: 11.4

## 2015-11-13 MED ORDER — FUROSEMIDE 20 MG PO TABS: 20.0000 mg | ORAL_TABLET | Freq: Every day | ORAL | Status: DC

## 2015-11-13 MED ORDER — GABAPENTIN 300 MG PO CAPS: 300.0000 mg | ORAL_CAPSULE | Freq: Every day | ORAL | Status: DC

## 2015-11-13 MED ORDER — ALPRAZOLAM 0.5 MG PO TABS: 0.5000 mg | ORAL_TABLET | Freq: Two times a day (BID) | ORAL | Status: DC

## 2015-11-13 MED ORDER — FUROSEMIDE 40 MG PO TABS
40.0000 mg | ORAL_TABLET | Freq: Every day | ORAL | Status: DC
Start: 2015-11-13 — End: 2016-02-12

## 2015-11-13 MED ORDER — METFORMIN HCL ER 750 MG PO TB24: 750.0000 mg | ORAL_TABLET | Freq: Every day | ORAL | Status: DC

## 2015-11-13 MED ORDER — LISINOPRIL-HYDROCHLOROTHIAZIDE 20-25 MG PO TABS: 1.0000 | ORAL_TABLET | Freq: Every day | ORAL | Status: DC

## 2015-11-13 MED ORDER — SIMVASTATIN 80 MG PO TABS: ORAL_TABLET | ORAL | Status: DC

## 2015-11-13 MED ORDER — INSULIN GLARGINE 100 UNIT/ML ~~LOC~~ SOLN: SUBCUTANEOUS | Status: DC

## 2015-11-13 NOTE — Progress Notes (Addendum)
Subjective:    Patient ID: Kylie Ward, female    DOB: [birth year], 70 y.o.   MRN: [id number]  Patient here today for Chronic disease follow up. At last visit her hgba1c was 11.7%. We increased her lantus to 70 units daily. She says that her blood sugars are all over the place. Her only complaint today is right foot anf leg swelling. Can stay swollen all day.  Diabetes She presents for her follow-up diabetic visit. She has type 2 diabetes mellitus. Pertinent negatives for hypoglycemia include no confusion or dizziness. Associated symptoms include visual change ( needs new glasses). Pertinent negatives for diabetes include no chest pain, no polydipsia and no polyphagia. Diabetic complications include nephropathy (feet). Risk factors for coronary artery disease include dyslipidemia, hypertension and post-menopausal. Current diabetic treatment includes insulin injections and oral agent (monotherapy). She is compliant with treatment most of the time. Her weight is stable. She has not had a previous visit with a dietitian. She rarely participates in exercise. Her breakfast blood glucose is taken between 9-10 am. Her breakfast blood glucose range is generally 130-140 mg/dl. Her lunch blood glucose is taken between 2-3 pm. Her lunch blood glucose range is generally 140-180 mg/dl. Her dinner blood glucose is taken between 7-8 pm. Her dinner blood glucose range is generally 140-180 mg/dl. Her highest blood glucose is >200 mg/dl. Her overall blood glucose range is 140-180 mg/dl. An ACE inhibitor/angiotensin II receptor blocker is being taken. She does not see a podiatrist.Eye exam is not current.  Hyperlipidemia This is a chronic problem. The current episode started more than 1 year ago. The problem is uncontrolled. Recent lipid tests were reviewed and are high. Exacerbating diseases include diabetes and obesity. Pertinent negatives include no chest pain, myalgias or shortness of breath. Current  antihyperlipidemic treatment includes statins. The current treatment provides moderate improvement of lipids. Compliance problems include adherence to diet and adherence to exercise.  Risk factors for coronary artery disease include diabetes mellitus, hypertension, obesity and post-menopausal.  Hypertension This is a chronic problem. The current episode started more than 1 year ago. The problem is controlled. Pertinent negatives include no chest pain or shortness of breath. Risk factors for coronary artery disease include diabetes mellitus, dyslipidemia, obesity and post-menopausal state. Past treatments include diuretics and ACE inhibitors. The current treatment provides moderate improvement. Compliance problems include diet and exercise.   GAD Xanax .5 as needed- keeps her calm Neuropathy feet Patient currently taking neurotin daily which helps some with burning sensation.       Review of Systems  Constitutional: Negative.   HENT: Negative.   Respiratory: Negative.  Negative for shortness of breath.   Cardiovascular: Negative.  Negative for chest pain.  Gastrointestinal: Negative.   Endocrine: Negative.  Negative for polydipsia and polyphagia.  Genitourinary: Negative.   Musculoskeletal: Negative.  Negative for myalgias.  Neurological: Positive for numbness. Negative for dizziness.  Psychiatric/Behavioral: Negative for confusion.       Objective:   Physical Exam  Constitutional: She is oriented to person, place, and time. She appears well-developed and well-nourished.  HENT:  Nose: Nose normal.  Mouth/Throat: Oropharynx is clear and moist.  Eyes: EOM are normal.  Neck: Trachea normal, normal range of motion and full passive range of motion without pain. Neck supple. No JVD present. Carotid bruit is not present. No thyromegaly present.  Cardiovascular: Normal rate, regular rhythm, normal heart sounds and intact distal pulses.  Exam reveals no gallop and no friction rub.   No  murmur  heard. Pulmonary/Chest: Effort normal and breath sounds normal.  Abdominal: Soft. Bowel sounds are normal. She exhibits no distension and no mass. There is no tenderness.  Musculoskeletal: Normal range of motion.  Lymphadenopathy:    She has no cervical adenopathy.  Neurological: She is alert and oriented to person, place, and time. She has normal reflexes.  Skin: Skin is warm and dry.  Psychiatric: She has a normal mood and affect. Her behavior is normal. Judgment and thought content normal.   BP 93/72 mmHg  Pulse 96  Temp(Src) 97 F (36.1 C) (Oral)  Ht '5\' 1"'  (1.549 m)  Wt 168 lb (76.204 kg)  BMI 31.76 kg/m2  LMP 01/27/1983  Results for orders placed or performed in visit on 11/13/15  POCT glycosylated hemoglobin (Hb A1C)  Result Value Ref Range   Hemoglobin A1C 11.4            Assessment & Plan:  .1. Essential hypertension, benign Do not add salt to diet - CMP14+EGFR - lisinopril-hydrochlorothiazide (PRINZIDE,ZESTORETIC) 20-25 MG tablet; Take 1 tablet by mouth daily.  Dispense: 90 tablet; Refill: 1  2. Hyperlipidemia with target LDL less than 100 Low fat diet - Lipid panel - simvastatin (ZOCOR) 80 MG tablet; TAKE 1 TABLET (80 MG TOTAL) BY MOUTH DAILY.  Dispense: 90 tablet; Refill: 1  3. GAD (generalized anxiety disorder) Stress management - ALPRAZolam (XANAX) 0.5 MG tablet; Take 1 tablet (0.5 mg total) by mouth 2 (two) times daily.  Dispense: 60 tablet; Refill: 1  4. Type 2 diabetes mellitus with diabetic neuropathy, without long-term current use of insulin ([place]) Stricter carb counting MUST DO SLIDING SCALE WITH EVERY MEAL- Must eat 3 meals a day Appointment with clinical pharmacist - POCT glycosylated hemoglobin (Hb A1C) - gabapentin (NEURONTIN) 300 MG capsule; Take 1 capsule (300 mg total) by mouth at bedtime.  Dispense: 90 capsule; Refill: 1 - metFORMIN (GLUCOPHAGE-XR) 750 MG 24 hr tablet; Take 1 tablet (750 mg total) by mouth daily with breakfast.   Dispense: 90 tablet; Refill: 0 - insulin glargine (LANTUS) 100 UNIT/ML injection; INJECT 0.64 MLS (64 UNITS TOTAL) INTO THE SKIN AT BEDTIME.  Dispense: 30 mL; Refill: 1  5. Peripheral edema elevate legs when sitting Increased lasix to 28m daily - POCT urinalysis dipstick - POCT UA - Microscopic Only - furosemide (LASIX) 40 MG tablet; Take 1 tablet (40 mg total) by mouth daily.  Dispense: 90 tablet; Refill: 1  6. BMI 31.0-31.9,adult Discussed diet and exercise for person with BMI >25 Will recheck weight in 3-6 months     Labs pending Health maintenance reviewed Diet and exercise encouraged Continue all meds Follow up  In 3 month   [place] FNP

## 2015-11-13 NOTE — Patient Instructions (Signed)

## 2015-11-14 ENCOUNTER — Ambulatory Visit (INDEPENDENT_AMBULATORY_CARE_PROVIDER_SITE_OTHER): Payer: Medicare Other | Admitting: Pharmacist

## 2015-11-14 VITALS — BP 101/67 | HR 70 | Ht 61.0 in | Wt 169.0 lb

## 2015-11-14 DIAGNOSIS — E1140 Type 2 diabetes mellitus with diabetic neuropathy, unspecified: Secondary | ICD-10-CM

## 2015-11-14 DIAGNOSIS — E669 Obesity, unspecified: Secondary | ICD-10-CM | POA: Diagnosis not present

## 2015-11-14 LAB — CMP14+EGFR
ALK PHOS: 101 IU/L (ref 39–117)
ALT: 23 IU/L (ref 0–32)
AST: 21 IU/L (ref 0–40)
Albumin/Globulin Ratio: 2.4 (ref 1.1–2.5)
Albumin: 4.5 g/dL (ref 3.6–4.8)
BUN/Creatinine Ratio: 23 (ref 11–26)
BUN: 18 mg/dL (ref 8–27)
Bilirubin Total: 0.3 mg/dL (ref 0.0–1.2)
CALCIUM: 10 mg/dL (ref 8.7–10.3)
CO2: 22 mmol/L (ref 18–29)
Chloride: 103 mmol/L (ref 96–106)
Creatinine, Ser: 0.8 mg/dL (ref 0.57–1.00)
GFR calc Af Amer: 88 mL/min/{1.73_m2} (ref >59)
GFR, EST NON AFRICAN AMERICAN: 77 mL/min/{1.73_m2} (ref >59)
GLOBULIN, TOTAL: 1.9 g/dL (ref 1.5–4.5)
GLUCOSE: 86 mg/dL (ref 65–99)
POTASSIUM: 4.9 mmol/L (ref 3.5–5.2)
SODIUM: 144 mmol/L (ref 134–144)
Total Protein: 6.4 g/dL (ref 6.0–8.5)

## 2015-11-14 LAB — LIPID PANEL
CHOL/HDL RATIO: 6.5 ratio — AB (ref 0.0–4.4)
Cholesterol, Total: 233 mg/dL — ABNORMAL HIGH (ref 100–199)
HDL: 36 mg/dL — ABNORMAL LOW (ref >39)
Triglycerides: 420 mg/dL — ABNORMAL HIGH (ref 0–149)

## 2015-11-14 MED ORDER — EMPAGLIFLOZIN 25 MG PO TABS: 25.0000 mg | ORAL_TABLET | Freq: Every day | ORAL | Status: DC

## 2015-11-14 MED ORDER — LISINOPRIL 20 MG PO TABS: 20.0000 mg | ORAL_TABLET | Freq: Every day | ORAL | Status: DC

## 2015-11-14 NOTE — Progress Notes (Signed)
Patient ID: Kylie Ward, female   DOB: [birth year], 70 y.o.   MRN: [id number]  Subjective:    Kylie Ward is a 70 y.o. female who presents for diabetes education and medication adjustment related to uncontrolled, Type 2 diabetes mellitus.  Current symptoms/problems include hyperglycemia and polydipsia and have been unchanged. Symptoms have been present for 6 months.  Known diabetic complications: peripheral neuropathy Cardiovascular risk factors: advanced age (older than 49 for men, 67 for women), diabetes mellitus, dyslipidemia, obesity (BMI >= 30 kg/m2) and sedentary lifestyle Current diabetic medications include metformin XR 750mg  qd (in past has diarrhea with regular release metformin and higher doses),  Lantus 64 units qhs and Humalog per sliding scale up to 12 units tid (though patinet was missing about 1 dose per day prior to last A1c).   Eye exam current (within one year): no Weight trend: stable Prior visit with CDE: yes - but has been several years Current diet: in general, an "unhealthy" diet Current exercise: none and through she reports 1-2 years ago she use to be more active  Current monitoring regimen: checkes BG 2 to 3 times a day Home blood sugar records: 200 to 400 Any episodes of hypoglycemia? no  Is She on ACE inhibitor or angiotensin II receptor blocker?  Yes  lisinopril HCT 20.25mg  1 tablet daily    The following portions of the patient's history were reviewed and updated as appropriate: allergies, current medications, past medical history and problem list.   Objective:    LMP 01/27/1983  Filed Vitals:   11/14/15 1233  BP: 101/67  Pulse: 70   Filed Weights   11/14/15 1233  Weight: 169 lb (76.658 kg)   Body mass index is 31.95 kg/(m^2).  A1c = 11.4% (11/13/2015)  Trace  LEE -  right slightly more than left  Lab Review GLUCOSE (mg/dL)  Date Value  40/98/1191 86  08/12/2015 136*  05/08/2015 229*   GLUCOSE, BLD (mg/dL)  Date Value   47/82/9562 90  01/26/2013 123*   CO2 (mmol/L)  Date Value  11/13/2015 22  08/12/2015 24  05/08/2015 26   BUN (mg/dL)  Date Value  13/06/6577 18  08/12/2015 24  05/08/2015 15  05/11/2013 18  01/26/2013 19   CREAT (mg/dL)  Date Value  46/96/2952 0.77  01/26/2013 0.88   CREATININE, SER (mg/dL)  Date Value  84/13/2440 0.80  08/12/2015 0.81  05/08/2015 0.79   Assessment:    Diabetes Mellitus type II, under inadequate control.    Plan:    1.  Rx changes: Add Jardiance 25mg  take 1 tablet qam  - gave #21 samples  Patient is taking furosemide and lisinopril HCTZ.  Since Jardiance also has some diuretic effects will change  Lisinopril HCTZ to just lisinopril 20mg  take 1 tablet daily.  Continue current doses of metformin and Lantus (may consider change to Basaglar in future due to lower cost and might keep patient out of Medicare coverage gap for 2017).   Also patient to continue Humalog per SS though I hope she is able to use the less often with the addition of Jardiance. Discussed ways to improve compliance with Humalog.   2.  Education: Reviewed 'ABCs' of diabetes management (respective goals in parentheses):  A1C (<7), blood pressure (<130/80), and cholesterol (LDL <100). 3. Discussed CHO counting diet.  At least 30 minutes was spent discussing serving size recommendations for CHO containing foods.  Patient was given sample diet with suggestions for breakfast, lunch, dinner and snacks that are  low in CHO and calories.  4.  Recommended restart exercise - start with 10 minutes and increase as able to 30 to 40 minutes at least 4 days per week.   5.   Follow up: 3 weeks    Henrene Pastorammy Darlyne Schmiesing, PharmD, CPP, CDE

## 2015-11-18 ENCOUNTER — Other Ambulatory Visit: Payer: Self-pay | Admitting: *Deleted

## 2015-11-18 MED ORDER — ACCU-CHEK FASTCLIX LANCETS MISC: Status: DC

## 2015-11-18 MED ORDER — CIPROFLOXACIN HCL 250 MG PO TABS: 250.0000 mg | ORAL_TABLET | Freq: Two times a day (BID) | ORAL | Status: DC

## 2015-11-18 MED ORDER — INSULIN PEN NEEDLE 31G X 8 MM MISC: 2.0000 | Freq: Two times a day (BID) | Status: DC

## 2015-11-19 ENCOUNTER — Telehealth: Payer: Self-pay | Admitting: Nurse Practitioner

## 2015-12-05 ENCOUNTER — Encounter: Payer: Self-pay | Admitting: Pharmacist

## 2015-12-05 ENCOUNTER — Ambulatory Visit (INDEPENDENT_AMBULATORY_CARE_PROVIDER_SITE_OTHER): Payer: Medicare Other | Admitting: Pharmacist

## 2015-12-05 VITALS — BP 100/70 | HR 60 | Ht 61.0 in | Wt 168.5 lb

## 2015-12-05 DIAGNOSIS — E119 Type 2 diabetes mellitus without complications: Secondary | ICD-10-CM | POA: Diagnosis not present

## 2015-12-05 DIAGNOSIS — Z0000 Encounter for general adult medical examination without abnormal findings: Secondary | ICD-10-CM

## 2015-12-05 DIAGNOSIS — Z1159 Encounter for screening for other viral diseases: Secondary | ICD-10-CM

## 2015-12-05 DIAGNOSIS — L659 Nonscarring hair loss, unspecified: Secondary | ICD-10-CM | POA: Diagnosis not present

## 2015-12-05 DIAGNOSIS — Z794 Long term (current) use of insulin: Secondary | ICD-10-CM | POA: Diagnosis not present

## 2015-12-05 MED ORDER — EMPAGLIFLOZIN 25 MG PO TABS: 25.0000 mg | ORAL_TABLET | Freq: Every day | ORAL | Status: DC

## 2015-12-05 MED ORDER — FLUCONAZOLE 150 MG PO TABS: 150.0000 mg | ORAL_TABLET | Freq: Once | ORAL | Status: DC

## 2015-12-05 NOTE — Progress Notes (Signed)
Patient ID: Kylie Ward, female   DOB: Jun 19, 1946, 70 y.o.   MRN: [id number]    Subjective:   Kylie Ward is a 70 y.o. white female who presents for an Initial Medicare Annual Wellness Visit and recheck diabetes. Patient's husband it present with her today.  They are both very sweet and cooperative with questioning/exam.   Mrs. Atiyeh reports that her BG has improved and she is tolerating Jardiance 20m well but when asked about yeast infection she mentions that since she took cipro a few weeks ago she has had vaginal itching.  Home BG readings:  7 day avg = 152 (n=14) 30 day avg = 151 (n=40) AM- 97, 206, 133, 92, 140, 102, 144, 229, 175, 145, 91, 234, 119 PM - 260, 146, 245  Review of Systems  Review of Systems  Constitutional: Negative.   HENT: Negative.   Eyes: Positive for blurred vision. Negative for double vision, photophobia, pain, discharge and redness.  Respiratory: Negative.   Cardiovascular: Positive for leg swelling (patient is having swelling in right ankle).  Gastrointestinal: Negative.   Genitourinary: Negative.   Musculoskeletal: Negative.   Skin: Negative.   Neurological: Negative.   Endo/Heme/Allergies: Negative for environmental allergies and polydipsia. Does not bruise/bleed easily.       Patient reports thinning / loss of hair  Psychiatric/Behavioral: The patient is nervous/anxious and has insomnia.      Current Medications (verified) Outpatient Encounter Prescriptions as of 12/05/2015  Medication Sig  . ACCU-CHEK FASTCLIX LANCETS MISC USE TO CHECK BLOOD SUGAR THREE TIMES A DAY OR AS INSTRUCTED BY PHYSICIAN  . ALPRAZolam (XANAX) 0.5 MG tablet Take 1 tablet (0.5 mg total) by mouth 2 (two) times daily.  .Marland Kitchenaspirin 81 MG tablet Take 81 mg by mouth daily.  . blood glucose meter kit and supplies 1 each by Other route 4 (four) times daily -  with meals and at bedtime.  . ciprofloxacin (CIPRO) 250 MG tablet Take 1 tablet (250 mg total) by mouth 2 (two)  times daily.  . empagliflozin (JARDIANCE) 25 MG TABS tablet Take 25 mg by mouth daily.  . furosemide (LASIX) 40 MG tablet Take 1 tablet (40 mg total) by mouth daily.  .Marland Kitchengabapentin (NEURONTIN) 300 MG capsule Take 1 capsule (300 mg total) by mouth at bedtime.  .Marland Kitchenglucose blood (ACCU-CHEK SMARTVIEW) test strip Check blood sugar tid. DX E11.42  . insulin glargine (LANTUS) 100 UNIT/ML injection INJECT 0.64 MLS (64 UNITS TOTAL) INTO THE SKIN AT BEDTIME.  . insulin lispro (HUMALOG) 100 UNIT/ML injection Sliding scale up to 8u TID  . Insulin Pen Needle 31G X 8 MM MISC 2 Sticks by Does not apply route 2 (two) times daily.  .Marland Kitchenlisinopril (PRINIVIL,ZESTRIL) 20 MG tablet Take 1 tablet (20 mg total) by mouth daily.  . metFORMIN (GLUCOPHAGE-XR) 750 MG 24 hr tablet Take 1 tablet (750 mg total) by mouth daily with breakfast.  . simvastatin (ZOCOR) 80 MG tablet TAKE 1 TABLET (80 MG TOTAL) BY MOUTH DAILY.  . vitamin B-12 (CYANOCOBALAMIN) 1000 MCG tablet Take by mouth.   No facility-administered encounter medications on file as of 12/05/2015.    Allergies (verified) Lipitor and Niaspan   History: Past Medical History  Diagnosis Date  . [place]   . Hyperlipidemia   . Hypertension   . Anxiety   . Stroke ([facility]    Past Surgical History  Procedure Laterality Date  . Carotid stent    . Carotid endarterectomy Right  Family History  Problem Relation Age of Onset  . Heart disease Father   . Heart attack Father   . Hypertension Father   . Diabetes Sister   . Stroke Sister   . Diabetes Brother   . Diabetes Sister   . Diabetes Sister   . Diabetes Brother   . Diabetes Brother    Social History   Occupational History  . Not on file.   Social History Main Topics  . Smoking status: Former Smoker    Quit date: 12/02/2009  . Smokeless tobacco: Former Systems developer    Quit date: 01/27/2011  . Alcohol Use: No  . Drug Use: No  . Sexual Activity: Not on file    Do you  feel safe at home?  Yes  Dietary issues and exercise activities: Current Exercise Habits:: The patient does not participate in regular exercise at present  Current Dietary habits:  Since our last visit patient has decreased potatoes, bread intake. She is eating more vegetables and grilled chicken.   Objective:    Today's Vitals   12/05/15 1249  BP: 100/70  Pulse: 60  Height: 5' 1" (1.549 m)  Weight: 168 lb 8 oz (76.431 kg)  PainSc: 0-No pain   Body mass index is 31.85 kg/(m^2).   1+ pitting edema on right ankle;  No discoloration and swelling does not extend up her extremity   Activities of Daily Living In your present state of health, do you have any difficulty performing the following activities: 12/05/2015 05/08/2015  Hearing? N N  Vision? Y N  Difficulty concentrating or making decisions? N N  Walking or climbing stairs? N N  Dressing or bathing? N N  Doing errands, shopping? N N  Preparing Food and eating ? N -  Using the Toilet? N -  In the past six months, have you accidently leaked urine? N -  Do you have problems with loss of bowel control? N -  Managing your Medications? N -  Managing your Finances? N -  Housekeeping or managing your Housekeeping? N -    Are there smokers in your home (other than you)? No   Cardiac Risk Factors include: advanced age (>47mn, >>50women);diabetes mellitus;dyslipidemia;family history of premature cardiovascular disease;hypertension;obesity (BMI >30kg/m2);sedentary lifestyle  Depression Screen PHQ 2/9 Scores 12/05/2015 08/12/2015 05/08/2015 12/20/2014  PHQ - 2 Score _0 PHQ- 9 Score 8 15 - 9    Fall Risk Fall Risk  12/05/2015 08/12/2015 05/08/2015 12/20/2014 09/06/2014  Falls in the past year? _1     Cognitive Function: MMSE - Mini Mental State Exam 12/05/2015  Orientation to time 5  Orientation to Place 5  Registration 3  Attention/ Calculation 5  Recall 1  Language- name 2 objects 2  Language- repeat 1  Language-  follow 3 step command 3  Language- read & follow direction 1  Write a sentence 1  Copy design 1  Total score 28    Immunizations and Health Maintenance Immunization History  Administered Date(s) Administered  . Influenza,inj,Quad PF,36+ Mos 10/19/2013, 09/06/2014, 08/14/2015  . Pneumococcal Conjugate-13 08/14/2015   Health Maintenance Due  Topic Date Due  . MAMMOGRAM  04/18/1966  . ZOSTAVAX  04/18/2008  . OPHTHALMOLOGY EXAM  09/07/2015    Patient Care Team: MChevis Pretty FNP as PCP - General (Nurse Practitioner)  Indicate any recent Medical Services you may have received from other than [facility] providers in the past year (date may be approximate).  Assessment:    Annual Wellness Visit    Screening Tests Health Maintenance  Topic Date Due  . MAMMOGRAM  04/18/1966  . ZOSTAVAX  04/18/2008  . OPHTHALMOLOGY EXAM  09/07/2015  . COLONOSCOPY  02/10/2016 (Originally 04/18/1998)  . Hepatitis C Screening  02/10/2016 (Originally [birth year])  . HEMOGLOBIN A1C  05/12/2016  . INFLUENZA VACCINE  06/01/2016  . FOOT EXAM  11/12/2016  . TETANUS/TDAP  08/01/2021  . DEXA SCAN  Completed  . PNA vac Low Risk Adult  Completed        Plan:   During the course of the visit Isha was educated and counseled about the following appropriate screening and preventive services:   Vaccines are UTD except Zostavax.  Checked price and was $247.90.  Patient declined today due to cost  Colorectal cancer screening -  Patient refuses colonoscopy but she did take FOBT to do at home today.  Reviewed procedure for test.  Cardiovascular disease screening- patient is due to have EKG since diabetic.  Will get at next PCP visit.  Diabetes - continue current medications  Fluconazole 175m 1 tablet for 1 dose  Bone Denisty / Osteoporosis Screening - checking on this - patient's chart states she had DEXA 08/12/15 but I could not find record and patient does not remember this.  Her husband does  remember her getting DEXA though  Mammogram - Appt scheduled for 01/30/2016  PAP - patient declined  Glaucoma screening / Diabetic Eye Exam - needed.  Patient's husband states he is going to call Dr NNeldon Mcto have diabetic eye exam  Nutrition counseling - continue to limit high CHO foods  Advanced Directives - Declined  Physical Activity -  Patient already has plans to start exercising at home. She has a stationary bike.  Goal is 150 minutes at least per week.   Referral to podiatrist for diabetic foot check / nail care and evaluate ankle.   Orders Placed This Encounter  Procedures  . BMP8+EGFR  . Hepatitis C antibody  . Thyroid Panel With TSH  . Ambulatory referral to Podiatry    Referral Priority:  Routine    Referral Type:  Consultation    Referral Reason:  Specialty Services Required    Referred to Provider:  CSteffanie Rainwater DPM    Requested Specialty:  Podiatry    Number of Visits Requested:  1     Patient Instructions (the written plan) were given to the patient.   ECherre Robins P[facility]  12/05/2015

## 2015-12-05 NOTE — Patient Instructions (Addendum)
  Kylie Ward , Thank you for taking time to come for your Medicare Wellness Visit. I appreciate your ongoing commitment to your health goals. Please review the following plan we discussed and let me know if I can assist you in the future.   These are the goals we discussed: Start daily exercise - goal is to get at least 150 minutes per week or 30 minutes per day.  Sent referral to see podiatrist - foot specialist - Dr Ulice Brilliant Recommend eye exam once a year - make sure to get appointment with Dr Gillian Scarce Continue Jardiance  This is a list of the screening recommended for you and due dates:  Health Maintenance  Topic Date Due  . Mammogram  04/18/1966 - appointment made for 01/30/2016 at 1:45pm  . Shingles Vaccine  04/18/2008 - verified cost today and would be $247.00  . Eye exam for diabetics  09/07/2015  . Colon Cancer Screening  02/10/2016 - given test to take home today  .  Hepatitis C: One time screening is recommended by Center for Disease Control  (CDC) for  adults born from [birth year] through 1963.   02/10/2016 - done today  . Hemoglobin A1C  05/12/2016  . Flu Shot  06/01/2016  . Complete foot exam   11/12/2016  . Tetanus Vaccine  08/01/2021  . DEXA scan (bone density measurement)  Completed  . Pneumonia vaccines  Completed  *Topic was postponed. The date shown is not the original due date.

## 2015-12-06 LAB — THYROID PANEL WITH TSH
Free Thyroxine Index: 2.4 (ref 1.2–4.9)
T3 UPTAKE RATIO: 25 % (ref 24–39)
T4, Total: 9.4 ug/dL (ref 4.5–12.0)
TSH: 0.753 u[IU]/mL (ref 0.450–4.500)

## 2015-12-06 LAB — BMP8+EGFR
BUN / CREAT RATIO: 24 (ref 11–26)
BUN: 19 mg/dL (ref 8–27)
CALCIUM: 9.1 mg/dL (ref 8.7–10.3)
CHLORIDE: 102 mmol/L (ref 96–106)
CO2: 23 mmol/L (ref 18–29)
Creatinine, Ser: 0.8 mg/dL (ref 0.57–1.00)
GFR calc Af Amer: 88 mL/min/{1.73_m2} (ref >59)
GFR calc non Af Amer: 77 mL/min/{1.73_m2} (ref >59)
GLUCOSE: 122 mg/dL — AB (ref 65–99)
POTASSIUM: 4.9 mmol/L (ref 3.5–5.2)
Sodium: 141 mmol/L (ref 134–144)

## 2015-12-06 LAB — HEPATITIS C ANTIBODY

## 2015-12-08 ENCOUNTER — Telehealth: Payer: Self-pay | Admitting: Nurse Practitioner

## 2015-12-08 NOTE — Telephone Encounter (Signed)
Please review and advise.

## 2015-12-09 ENCOUNTER — Telehealth: Payer: Self-pay | Admitting: Nurse Practitioner

## 2015-12-09 NOTE — Telephone Encounter (Signed)
Stp and advised the Podiatry referral hasn't been scheduled yet but as soon as it is she will get a call advising of the appt details. Pt voiced understanding.

## 2015-12-12 ENCOUNTER — Telehealth: Payer: Self-pay | Admitting: Nurse Practitioner

## 2015-12-12 NOTE — Telephone Encounter (Signed)
Pt is scheduled °

## 2015-12-18 LAB — HM DIABETES EYE EXAM

## 2016-01-06 DIAGNOSIS — R601 Generalized edema: Secondary | ICD-10-CM | POA: Diagnosis not present

## 2016-01-06 DIAGNOSIS — E1142 Type 2 diabetes mellitus with diabetic polyneuropathy: Secondary | ICD-10-CM | POA: Diagnosis not present

## 2016-01-06 DIAGNOSIS — M79671 Pain in right foot: Secondary | ICD-10-CM | POA: Diagnosis not present

## 2016-01-08 DIAGNOSIS — M7989 Other specified soft tissue disorders: Secondary | ICD-10-CM | POA: Diagnosis not present

## 2016-01-08 DIAGNOSIS — E1142 Type 2 diabetes mellitus with diabetic polyneuropathy: Secondary | ICD-10-CM | POA: Diagnosis not present

## 2016-01-08 DIAGNOSIS — M79671 Pain in right foot: Secondary | ICD-10-CM | POA: Diagnosis not present

## 2016-01-08 DIAGNOSIS — R609 Edema, unspecified: Secondary | ICD-10-CM | POA: Diagnosis not present

## 2016-01-09 ENCOUNTER — Other Ambulatory Visit: Payer: Self-pay | Admitting: Pharmacist

## 2016-01-13 ENCOUNTER — Telehealth: Payer: Self-pay | Admitting: *Deleted

## 2016-01-13 ENCOUNTER — Other Ambulatory Visit: Payer: Self-pay | Admitting: Nurse Practitioner

## 2016-01-13 DIAGNOSIS — L03122 Acute lymphangitis of left axilla: Secondary | ICD-10-CM | POA: Diagnosis not present

## 2016-01-13 MED ORDER — EMPAGLIFLOZIN 25 MG PO TABS: 25.0000 mg | ORAL_TABLET | Freq: Every day | ORAL | Status: DC

## 2016-01-13 NOTE — Telephone Encounter (Signed)
Please call in xanax with 1 refills 

## 2016-01-13 NOTE — Telephone Encounter (Signed)
done

## 2016-01-13 NOTE — Telephone Encounter (Signed)
Last filled 12/20/15, last seen 11/13/15. Call in

## 2016-01-14 NOTE — Telephone Encounter (Signed)
Refill called to [facility] 

## 2016-01-15 ENCOUNTER — Other Ambulatory Visit: Payer: Self-pay | Admitting: *Deleted

## 2016-01-27 DIAGNOSIS — L03122 Acute lymphangitis of left axilla: Secondary | ICD-10-CM | POA: Diagnosis not present

## 2016-01-30 ENCOUNTER — Encounter: Payer: Medicare Other | Admitting: *Deleted

## 2016-02-12 ENCOUNTER — Encounter (INDEPENDENT_AMBULATORY_CARE_PROVIDER_SITE_OTHER): Payer: Self-pay

## 2016-02-12 ENCOUNTER — Ambulatory Visit (INDEPENDENT_AMBULATORY_CARE_PROVIDER_SITE_OTHER): Payer: Medicare Other | Admitting: Nurse Practitioner

## 2016-02-12 ENCOUNTER — Encounter: Payer: Self-pay | Admitting: Nurse Practitioner

## 2016-02-12 VITALS — BP 107/70 | HR 78 | Temp 97.3°F | Ht 61.0 in | Wt 165.2 lb

## 2016-02-12 DIAGNOSIS — R609 Edema, unspecified: Secondary | ICD-10-CM

## 2016-02-12 DIAGNOSIS — E785 Hyperlipidemia, unspecified: Secondary | ICD-10-CM

## 2016-02-12 DIAGNOSIS — E1140 Type 2 diabetes mellitus with diabetic neuropathy, unspecified: Secondary | ICD-10-CM

## 2016-02-12 DIAGNOSIS — I10 Essential (primary) hypertension: Secondary | ICD-10-CM | POA: Diagnosis not present

## 2016-02-12 DIAGNOSIS — Z6831 Body mass index (BMI) 31.0-31.9, adult: Secondary | ICD-10-CM | POA: Diagnosis not present

## 2016-02-12 DIAGNOSIS — Z794 Long term (current) use of insulin: Secondary | ICD-10-CM | POA: Diagnosis not present

## 2016-02-12 DIAGNOSIS — E1142 Type 2 diabetes mellitus with diabetic polyneuropathy: Secondary | ICD-10-CM | POA: Diagnosis not present

## 2016-02-12 DIAGNOSIS — F411 Generalized anxiety disorder: Secondary | ICD-10-CM | POA: Diagnosis not present

## 2016-02-12 LAB — BAYER DCA HB A1C WAIVED: HB A1C: 8.8 % — AB (ref <7.0)

## 2016-02-12 MED ORDER — EMPAGLIFLOZIN 25 MG PO TABS: 25.0000 mg | ORAL_TABLET | Freq: Every day | ORAL | Status: DC

## 2016-02-12 MED ORDER — METFORMIN HCL ER 750 MG PO TB24: 750.0000 mg | ORAL_TABLET | Freq: Every day | ORAL | Status: DC

## 2016-02-12 MED ORDER — FUROSEMIDE 40 MG PO TABS: 40.0000 mg | ORAL_TABLET | Freq: Every day | ORAL | Status: DC

## 2016-02-12 MED ORDER — LISINOPRIL 20 MG PO TABS: 20.0000 mg | ORAL_TABLET | Freq: Every day | ORAL | Status: DC

## 2016-02-12 MED ORDER — GABAPENTIN 300 MG PO CAPS: 300.0000 mg | ORAL_CAPSULE | Freq: Every day | ORAL | Status: DC

## 2016-02-12 MED ORDER — V-2 HIGH COMPRESSION HOSE MISC: 1.0000 | Freq: Every day | Status: DC

## 2016-02-12 MED ORDER — INSULIN GLARGINE 100 UNIT/ML ~~LOC~~ SOLN: SUBCUTANEOUS | Status: DC

## 2016-02-12 MED ORDER — SIMVASTATIN 80 MG PO TABS: ORAL_TABLET | ORAL | Status: DC

## 2016-02-12 NOTE — Patient Instructions (Signed)
Edema °Edema is an abnormal buildup of fluids in your body tissues. Edema is somewhat dependent on gravity to pull the fluid to the lowest place in your body. That makes the condition more common in the legs and thighs (lower extremities). Painless swelling of the feet and ankles is common and becomes more likely as you get older. It is also common in looser tissues, like around your eyes.  °When the affected area is squeezed, the fluid may move out of that spot and leave a dent for a few moments. This dent is called pitting.  °CAUSES  °There are many possible causes of edema. Eating too much salt and being on your feet or sitting for a long time can cause edema in your legs and ankles. Hot weather may make edema worse. Common medical causes of edema include: °· Heart failure. °· Liver disease. °· Kidney disease. °· Weak blood vessels in your legs. °· Cancer. °· An injury. °· Pregnancy. °· Some medications. °· Obesity.  °SYMPTOMS  °Edema is usually painless. Your skin may look swollen or shiny.  °DIAGNOSIS  °Your health care provider may be able to diagnose edema by asking about your medical history and doing a physical exam. You may need to have tests such as X-rays, an electrocardiogram, or blood tests to check for medical conditions that may cause edema.  °TREATMENT  °Edema treatment depends on the cause. If you have heart, liver, or kidney disease, you need the treatment appropriate for these conditions. General treatment may include: °· Elevation of the affected body part above the level of your heart. °· Compression of the affected body part. Pressure from elastic bandages or support stockings squeezes the tissues and forces fluid back into the blood vessels. This keeps fluid from entering the tissues. °· Restriction of fluid and salt intake. °· Use of a water pill (diuretic). These medications are appropriate only for some types of edema. They pull fluid out of your body and make you urinate more often. This  gets rid of fluid and reduces swelling, but diuretics can have side effects. Only use diuretics as directed by your health care provider. °HOME CARE INSTRUCTIONS  °· Keep the affected body part above the level of your heart when you are lying down.   °· Do not sit still or stand for prolonged periods.   °· Do not put anything directly under your knees when lying down. °· Do not wear constricting clothing or garters on your upper legs.   °· Exercise your legs to work the fluid back into your blood vessels. This may help the swelling go down.   °· Wear elastic bandages or support stockings to reduce ankle swelling as directed by your health care provider.   °· Eat a low-salt diet to reduce fluid if your health care provider recommends it.   °· Only take medicines as directed by your health care provider.  °SEEK MEDICAL CARE IF:  °· Your edema is not responding to treatment. °· You have heart, liver, or kidney disease and notice symptoms of edema. °· You have edema in your legs that does not improve after elevating them.   °· You have sudden and unexplained weight gain. °SEEK IMMEDIATE MEDICAL CARE IF:  °· You develop shortness of breath or chest pain.   °· You cannot breathe when you lie down. °· You develop pain, redness, or warmth in the swollen areas.   °· You have heart, liver, or kidney disease and suddenly get edema. °· You have a fever and your symptoms suddenly get worse. °MAKE SURE YOU:  °·   Understand these instructions. °· Will watch your condition. °· Will get help right away if you are not doing well or get worse. °  °This information is not intended to replace advice given to you by your health care provider. Make sure you discuss any questions you have with your health care provider. °  °Document Released: 10/18/2005 Document Revised: 11/08/2014 Document Reviewed: 08/10/2013 °Elsevier Interactive Patient Education ©2016 [employer] ° °

## 2016-02-12 NOTE — Progress Notes (Signed)
Subjective:    Patient ID: ANTRICE PAL, female    DOB: [birth year], 70 y.o.   MRN: [id number]    Patient here today for follow up of chronic medical problems.  Current Outpatient Prescriptions on File Prior to Visit  Medication Sig Dispense Refill  . ACCU-CHEK FASTCLIX LANCETS MISC USE TO CHECK BLOOD SUGAR THREE TIMES A DAY OR AS INSTRUCTED BY PHYSICIAN 102 each 1  . ALPRAZolam (XANAX) 0.5 MG tablet TAKE  (1)  TABLET TWICE A DAY. 60 tablet 0  . aspirin 81 MG tablet Take 81 mg by mouth daily.    . blood glucose meter kit and supplies 1 each by Other route 4 (four) times daily -  with meals and at bedtime. 1 each 0  . empagliflozin (JARDIANCE) 25 MG TABS tablet Take 25 mg by mouth daily. 30 tablet 1  . furosemide (LASIX) 40 MG tablet Take 1 tablet (40 mg total) by mouth daily. 90 tablet 1  . gabapentin (NEURONTIN) 300 MG capsule Take 1 capsule (300 mg total) by mouth at bedtime. 90 capsule 1  . glucose blood (ACCU-CHEK SMARTVIEW) test strip Check blood sugar tid. DX E11.42 100 each 4  . insulin glargine (LANTUS) 100 UNIT/ML injection INJECT 0.64 MLS (64 UNITS TOTAL) INTO THE SKIN AT BEDTIME. 30 mL 1  . insulin lispro (HUMALOG) 100 UNIT/ML injection Sliding scale up to 8u TID 10 mL 11  . Insulin Pen Needle 31G X 8 MM MISC 2 Sticks by Does not apply route 2 (two) times daily. 100 each 1  . lisinopril (PRINIVIL,ZESTRIL) 20 MG tablet TAKE 1 TABLET DAILY 90 tablet 0  . metFORMIN (GLUCOPHAGE-XR) 750 MG 24 hr tablet Take 1 tablet (750 mg total) by mouth daily with breakfast. 90 tablet 0  . simvastatin (ZOCOR) 80 MG tablet TAKE 1 TABLET (80 MG TOTAL) BY MOUTH DAILY. 90 tablet 1  . vitamin B-12 (CYANOCOBALAMIN) 1000 MCG tablet Take by mouth.     No current facility-administered medications on file prior to visit.   Patient here today for Chronic disease follow up. At last visit her hgba1c was 11.4%. We increased her lantus to 64 units daily. She says that her blood sugars are all over the place.  Her only complaint today is right foot anf leg swelling. Can stay swollen all day. Eye exam a month ago was good Went to podiatrist and he diagnosed her with cellulitis in her right ankle. Gave her Cipro  Diabetes She presents for her follow-up diabetic visit. She has type 2 diabetes mellitus. Pertinent negatives for hypoglycemia include no confusion or dizziness. Pertinent negatives for diabetes include no chest pain, no polydipsia, no polyphagia and no visual change ( needs new glasses). Diabetic complications include nephropathy (feet). Risk factors for coronary artery disease include dyslipidemia, hypertension and post-menopausal. Current diabetic treatment includes insulin injections and oral agent (monotherapy). She is compliant with treatment most of the time. Her weight is stable. She has not had a previous visit with a dietitian. She rarely participates in exercise. Her breakfast blood glucose is taken between 9-10 am. Her breakfast blood glucose range is generally 90-110 mg/dl. Her lunch blood glucose is taken between 2-3 pm. Her lunch blood glucose range is generally 140-180 mg/dl. Her dinner blood glucose is taken between 7-8 pm. Her dinner blood glucose range is generally 140-180 mg/dl. Her highest blood glucose is >200 mg/dl. Her overall blood glucose range is 140-180 mg/dl. An ACE inhibitor/angiotensin II receptor blocker is being taken. She does not  see a podiatrist.Eye exam is not current.  Hyperlipidemia This is a chronic problem. The current episode started more than 1 year ago. The problem is uncontrolled. Recent lipid tests were reviewed and are high. Exacerbating diseases include diabetes and obesity. Pertinent negatives include no chest pain, myalgias or shortness of breath. Current antihyperlipidemic treatment includes statins. The current treatment provides moderate improvement of lipids. Compliance problems include adherence to diet and adherence to exercise.  Risk factors for  coronary artery disease include diabetes mellitus, hypertension, obesity and post-menopausal.  Hypertension This is a chronic problem. The current episode started more than 1 year ago. The problem is controlled. Pertinent negatives include no chest pain or shortness of breath. Risk factors for coronary artery disease include diabetes mellitus, dyslipidemia, obesity and post-menopausal state. Past treatments include diuretics and ACE inhibitors. The current treatment provides moderate improvement. Compliance problems include diet and exercise.   GAD Xanax .5 as needed- keeps her calm Neuropathy feet Patient currently taking neurotin daily which helps some with burning sensation.  Review of Systems  Constitutional: Negative.   HENT: Negative.   Eyes: Negative.        Had eye exam last month  Respiratory: Negative.  Negative for shortness of breath.   Cardiovascular: Negative.  Negative for chest pain.  Gastrointestinal: Negative.   Endocrine: Negative.  Negative for polydipsia and polyphagia.  Genitourinary: Negative.   Musculoskeletal: Negative.  Negative for myalgias.       Went to podiatrist a month ago.  Skin: Negative.   Neurological: Positive for numbness. Negative for dizziness.  Hematological: Negative.   Psychiatric/Behavioral: Negative for confusion.     Objective:   Physical Exam  Constitutional: She is oriented to person, place, and time. Vital signs are normal. She appears well-developed and well-nourished.  HENT:  Right Ear: Hearing, tympanic membrane, external ear and ear canal normal.  Left Ear: Hearing, tympanic membrane, external ear and ear canal normal.  Nose: Nose normal.  Mouth/Throat: Uvula is midline and oropharynx is clear and moist.  Eyes: Conjunctivae and EOM are normal.  Neck: Trachea normal, normal range of motion and full passive range of motion without pain. Neck supple. No JVD present. Carotid bruit is not present. No thyromegaly present.   Cardiovascular: Normal rate, regular rhythm, normal heart sounds and intact distal pulses.  Exam reveals no gallop and no friction rub.   No murmur heard. Pulmonary/Chest: Effort normal and breath sounds normal.  Abdominal: Soft. Normal appearance and bowel sounds are normal. She exhibits no distension and no mass. There is no tenderness.  Musculoskeletal: Normal range of motion. She exhibits edema.  1+ edema in right foot  Lymphadenopathy:    She has no cervical adenopathy.  Neurological: She is alert and oriented to person, place, and time. She has normal reflexes.  Skin: Skin is warm and dry.  Psychiatric: She has a normal mood and affect. Her behavior is normal. Judgment and thought content normal.  Vitals reviewed.   BP 107/70 mmHg  Pulse 78  Temp(Src) 97.3 F (36.3 C) (Oral)  Ht '5\' 1"'  (1.549 m)  Wt 165 lb 3.2 oz (74.934 kg)  BMI 31.23 kg/m2  LMP 01/27/1983   Hgb A1C 8.8     Assessment & Plan:   1. Essential hypertension, benign No salt added to diet - CMP14+EGFR - lisinopril (PRINIVIL,ZESTRIL) 20 MG tablet; Take 1 tablet (20 mg total) by mouth daily.  Dispense: 90 tablet; Refill: 1  2. Hyperlipidemia with target LDL less than 100 Low  fat diet - Lipid panel - simvastatin (ZOCOR) 80 MG tablet; TAKE 1 TABLET (80 MG TOTAL) BY MOUTH DAILY.  Dispense: 90 tablet; Refill: 1  3. Type 2 diabetes mellitus with diabetic neuropathy, without long-term current use of insulin (HCC) Low carb diet - Bayer DCA Hb A1c Waived - CMP14+EGFR - gabapentin (NEURONTIN) 300 MG capsule; Take 1 capsule (300 mg total) by mouth at bedtime.  Dispense: 90 capsule; Refill: 1 - metFORMIN (GLUCOPHAGE-XR) 750 MG 24 hr tablet; Take 1 tablet (750 mg total) by mouth daily with breakfast.  Dispense: 90 tablet; Refill: 1 - insulin glargine (LANTUS) 100 UNIT/ML injection; INJECT 0.64 MLS (64 UNITS TOTAL) INTO THE SKIN AT BEDTIME.  Dispense: 30 mL; Refill: 5 - empagliflozin (JARDIANCE) 25 MG TABS tablet;  Take 25 mg by mouth daily.  Dispense: 30 tablet; Refill: 5  4. BMI 31.0-31.9,adult Low fat diet  5. GAD (generalized anxiety disorder) Stress management  6. Peripheral edema Wear compression hose - furosemide (LASIX) 40 MG tablet; Take 1 tablet (40 mg total) by mouth daily.  Dispense: 90 tablet; Refill: 1 - Elastic Bandages & Supports (V-2 HIGH COMPRESSION HOSE) MISC; 1 each by Does not apply route daily.  Dispense: 1 each; Refill: 0   Continue all meds Labs pending Health Maintenance reviewed Diet and exercise encouraged RTO 3 months Rosalio Loud FNP Student Mary-Margaret Hassell Done, FNP

## 2016-02-13 LAB — CMP14+EGFR
A/G RATIO: 1.8 (ref 1.2–2.2)
ALBUMIN: 4.2 g/dL (ref 3.6–4.8)
ALT: 28 IU/L (ref 0–32)
AST: 21 IU/L (ref 0–40)
Alkaline Phosphatase: 98 IU/L (ref 39–117)
BUN/Creatinine Ratio: 23 (ref 12–28)
BUN: 18 mg/dL (ref 8–27)
Bilirubin Total: <0.2 mg/dL (ref 0.0–1.2)
CALCIUM: 9.6 mg/dL (ref 8.7–10.3)
CO2: 21 mmol/L (ref 18–29)
Chloride: 100 mmol/L (ref 96–106)
Creatinine, Ser: 0.8 mg/dL (ref 0.57–1.00)
GFR, EST AFRICAN AMERICAN: 88 mL/min/{1.73_m2} (ref >59)
GFR, EST NON AFRICAN AMERICAN: 77 mL/min/{1.73_m2} (ref >59)
Globulin, Total: 2.3 g/dL (ref 1.5–4.5)
Glucose: 87 mg/dL (ref 65–99)
POTASSIUM: 4.7 mmol/L (ref 3.5–5.2)
Sodium: 140 mmol/L (ref 134–144)
TOTAL PROTEIN: 6.5 g/dL (ref 6.0–8.5)

## 2016-02-13 LAB — LIPID PANEL
CHOL/HDL RATIO: 7.3 ratio — AB (ref 0.0–4.4)
Cholesterol, Total: 269 mg/dL — ABNORMAL HIGH (ref 100–199)
HDL: 37 mg/dL — AB (ref >39)
Triglycerides: 462 mg/dL — ABNORMAL HIGH (ref 0–149)

## 2016-02-16 ENCOUNTER — Other Ambulatory Visit: Payer: Self-pay | Admitting: Nurse Practitioner

## 2016-02-16 MED ORDER — FENOFIBRATE 160 MG PO TABS: 160.0000 mg | ORAL_TABLET | Freq: Every day | ORAL | Status: DC

## 2016-03-01 ENCOUNTER — Telehealth: Payer: Self-pay

## 2016-03-01 ENCOUNTER — Other Ambulatory Visit: Payer: Self-pay | Admitting: Nurse Practitioner

## 2016-03-01 MED ORDER — TOLTERODINE TARTRATE ER 2 MG PO CP24: 2.0000 mg | ORAL_CAPSULE | Freq: Every day | ORAL | Status: DC

## 2016-03-01 NOTE — Telephone Encounter (Signed)
Patient aware that medication has been sent to the pharmacy and may take several days of taking it before it starts working.

## 2016-03-01 NOTE — Telephone Encounter (Signed)
Patient states that everytime she sneezes or coughs she urinates a little and has a hard time when she goes places. Wants to know if their are meds she can try to help control this

## 2016-03-01 NOTE — Telephone Encounter (Signed)
Can try detrol LA but may take several days of taking before it starts working- rx sent to pharmacy

## 2016-03-04 ENCOUNTER — Telehealth: Payer: Self-pay

## 2016-03-04 MED ORDER — MIRABEGRON ER 25 MG PO TB24: 25.0000 mg | ORAL_TABLET | Freq: Every day | ORAL | Status: DC

## 2016-03-04 NOTE — Telephone Encounter (Signed)
Left message, detrol changed due to insurance.

## 2016-03-04 NOTE — Telephone Encounter (Signed)
Insurance would not cover detrol ( tolterodine ) - changed to [employer]

## 2016-03-04 NOTE — Telephone Encounter (Signed)
Insurance denied Tolterodine Tartrate ER   Must try two of the following first: Myrbetriq and Oxybutynin ER

## 2016-03-05 ENCOUNTER — Other Ambulatory Visit: Payer: Self-pay | Admitting: Nurse Practitioner

## 2016-04-02 ENCOUNTER — Encounter: Payer: Self-pay | Admitting: *Deleted

## 2016-04-05 ENCOUNTER — Other Ambulatory Visit: Payer: Self-pay | Admitting: Nurse Practitioner

## 2016-04-05 MED ORDER — FLUCONAZOLE 150 MG PO TABS: 150.0000 mg | ORAL_TABLET | Freq: Once | ORAL | Status: DC

## 2016-04-05 MED ORDER — ALPRAZOLAM 1 MG PO TABS: 1.0000 mg | ORAL_TABLET | Freq: Two times a day (BID) | ORAL | Status: DC | PRN

## 2016-04-05 NOTE — Telephone Encounter (Signed)
Last seen 02/12/16  MMM If approved [facility]

## 2016-04-20 ENCOUNTER — Other Ambulatory Visit: Payer: Self-pay | Admitting: Family Medicine

## 2016-05-12 ENCOUNTER — Ambulatory Visit: Payer: Medicare Other | Admitting: Nurse Practitioner

## 2016-05-13 ENCOUNTER — Encounter: Payer: Self-pay | Admitting: Nurse Practitioner

## 2016-05-14 ENCOUNTER — Ambulatory Visit: Payer: Medicare Other | Admitting: Nurse Practitioner

## 2016-05-18 ENCOUNTER — Other Ambulatory Visit: Payer: Self-pay | Admitting: *Deleted

## 2016-05-18 MED ORDER — INSULIN GLARGINE 100 UNIT/ML ~~LOC~~ SOLN: 64.0000 [IU] | Freq: Every day | SUBCUTANEOUS | Status: DC

## 2016-05-18 MED ORDER — FENOFIBRATE 160 MG PO TABS: 160.0000 mg | ORAL_TABLET | Freq: Every day | ORAL | Status: DC

## 2016-05-18 MED ORDER — ALPRAZOLAM 1 MG PO TABS: 1.0000 mg | ORAL_TABLET | Freq: Two times a day (BID) | ORAL | Status: DC | PRN

## 2016-05-18 NOTE — Telephone Encounter (Signed)
rx called into pharmacy

## 2016-05-18 NOTE — Telephone Encounter (Signed)
Please call in xanax with 1 refills NTBS

## 2016-05-19 ENCOUNTER — Telehealth: Payer: Self-pay | Admitting: Nurse Practitioner

## 2016-05-19 ENCOUNTER — Other Ambulatory Visit: Payer: Self-pay | Admitting: *Deleted

## 2016-05-19 NOTE — Telephone Encounter (Signed)
She does not want one

## 2016-06-01 ENCOUNTER — Encounter: Payer: Self-pay | Admitting: Nurse Practitioner

## 2016-06-01 ENCOUNTER — Ambulatory Visit (INDEPENDENT_AMBULATORY_CARE_PROVIDER_SITE_OTHER): Payer: Medicare Other | Admitting: Nurse Practitioner

## 2016-06-01 VITALS — BP 91/70 | HR 83 | Temp 97.4°F | Ht 61.0 in | Wt 163.0 lb

## 2016-06-01 DIAGNOSIS — I10 Essential (primary) hypertension: Secondary | ICD-10-CM

## 2016-06-01 DIAGNOSIS — F411 Generalized anxiety disorder: Secondary | ICD-10-CM | POA: Diagnosis not present

## 2016-06-01 DIAGNOSIS — E1140 Type 2 diabetes mellitus with diabetic neuropathy, unspecified: Secondary | ICD-10-CM

## 2016-06-01 DIAGNOSIS — R3915 Urgency of urination: Secondary | ICD-10-CM

## 2016-06-01 DIAGNOSIS — E785 Hyperlipidemia, unspecified: Secondary | ICD-10-CM | POA: Diagnosis not present

## 2016-06-01 DIAGNOSIS — R609 Edema, unspecified: Secondary | ICD-10-CM | POA: Diagnosis not present

## 2016-06-01 DIAGNOSIS — Z6831 Body mass index (BMI) 31.0-31.9, adult: Secondary | ICD-10-CM | POA: Diagnosis not present

## 2016-06-01 LAB — BAYER DCA HB A1C WAIVED: HB A1C (BAYER DCA - WAIVED): 9.3 % — ABNORMAL HIGH (ref <7.0)

## 2016-06-01 MED ORDER — FUROSEMIDE 40 MG PO TABS: 40.0000 mg | ORAL_TABLET | Freq: Every day | ORAL | 1 refills | Status: DC

## 2016-06-01 MED ORDER — METFORMIN HCL ER 750 MG PO TB24: 750.0000 mg | ORAL_TABLET | Freq: Every day | ORAL | 1 refills | Status: DC

## 2016-06-01 MED ORDER — LISINOPRIL 20 MG PO TABS: 20.0000 mg | ORAL_TABLET | Freq: Every day | ORAL | 1 refills | Status: DC

## 2016-06-01 MED ORDER — MIRABEGRON ER 25 MG PO TB24: 25.0000 mg | ORAL_TABLET | Freq: Every day | ORAL | 5 refills | Status: DC

## 2016-06-01 MED ORDER — ALPRAZOLAM 1 MG PO TABS: 1.0000 mg | ORAL_TABLET | Freq: Two times a day (BID) | ORAL | 1 refills | Status: DC | PRN

## 2016-06-01 MED ORDER — INSULIN LISPRO 100 UNIT/ML ~~LOC~~ SOLN: SUBCUTANEOUS | 11 refills | Status: DC

## 2016-06-01 MED ORDER — EMPAGLIFLOZIN 25 MG PO TABS: 25.0000 mg | ORAL_TABLET | Freq: Every day | ORAL | 5 refills | Status: DC

## 2016-06-01 MED ORDER — GABAPENTIN 300 MG PO CAPS: 300.0000 mg | ORAL_CAPSULE | Freq: Every day | ORAL | 1 refills | Status: DC

## 2016-06-01 MED ORDER — FENOFIBRATE 160 MG PO TABS: 160.0000 mg | ORAL_TABLET | Freq: Every day | ORAL | 1 refills | Status: DC

## 2016-06-01 MED ORDER — SIMVASTATIN 80 MG PO TABS: ORAL_TABLET | ORAL | 1 refills | Status: DC

## 2016-06-01 NOTE — Progress Notes (Signed)
Subjective:    Patient ID: Kylie Ward, female    DOB: [birth year], 70 y.o.   MRN: [id number]    Patient here today for follow up of chronic medical problems.  Current Outpatient Prescriptions on File Prior to Visit  Medication Sig Dispense Refill  . ACCU-CHEK FASTCLIX LANCETS MISC USE TO CHECK BLOOD SUGAR THREE TIMES A DAY OR AS INSTRUCTED BY PHYSICIAN 102 each 1  . ALPRAZolam (XANAX) 1 MG tablet Take 1 tablet (1 mg total) by mouth 2 (two) times daily as needed for anxiety. 60 tablet 1  . aspirin 81 MG tablet Take 81 mg by mouth daily.    . blood glucose meter kit and supplies 1 each by Other route 4 (four) times daily -  with meals and at bedtime. 1 each 0  . Elastic Bandages & Supports (V-2 HIGH COMPRESSION HOSE) MISC 1 each by Does not apply route daily. 1 each 0  . empagliflozin (JARDIANCE) 25 MG TABS tablet Take 25 mg by mouth daily. 30 tablet 5  . fenofibrate 160 MG tablet Take 1 tablet (160 mg total) by mouth daily. 90 tablet 1  . fluconazole (DIFLUCAN) 150 MG tablet Take 1 tablet (150 mg total) by mouth once. 1 tablet 0  . furosemide (LASIX) 40 MG tablet Take 1 tablet (40 mg total) by mouth daily. 90 tablet 1  . gabapentin (NEURONTIN) 300 MG capsule Take 1 capsule (300 mg total) by mouth at bedtime. 90 capsule 1  . glucose blood (ACCU-CHEK SMARTVIEW) test strip Check blood sugar tid. DX E11.42 100 each 4  . insulin glargine (LANTUS) 100 UNIT/ML injection Inject 0.64 mLs (64 Units total) into the skin at bedtime. 30 mL 0  . insulin lispro (HUMALOG) 100 UNIT/ML injection Sliding scale up to 8u TID 10 mL 11  . Insulin Pen Needle 31G X 8 MM MISC 2 Sticks by Does not apply route 2 (two) times daily. 100 each 1  . lisinopril (PRINIVIL,ZESTRIL) 20 MG tablet Take 1 tablet (20 mg total) by mouth daily. 90 tablet 1  . metFORMIN (GLUCOPHAGE-XR) 750 MG 24 hr tablet Take 1 tablet (750 mg total) by mouth daily with breakfast. 90 tablet 1  . mirabegron ER (MYRBETRIQ) 25 MG TB24 tablet Take 1  tablet (25 mg total) by mouth daily. 30 tablet 5  . simvastatin (ZOCOR) 80 MG tablet TAKE 1 TABLET (80 MG TOTAL) BY MOUTH DAILY. 90 tablet 1   No current facility-administered medications on file prior to visit.    Diabetes  She presents for her follow-up diabetic visit. She has type 2 diabetes mellitus. Pertinent negatives for hypoglycemia include no confusion or dizziness. Pertinent negatives for diabetes include no chest pain, no polydipsia, no polyphagia and no visual change ( needs new glasses). Diabetic complications include nephropathy (feet). Risk factors for coronary artery disease include dyslipidemia, hypertension and post-menopausal. Current diabetic treatment includes insulin injections and oral agent (monotherapy). She is compliant with treatment most of the time. Her weight is stable. She has not had a previous visit with a dietitian. She rarely participates in exercise. Her breakfast blood glucose is taken between 9-10 am. Her breakfast blood glucose range is generally 90-110 mg/dl. Her lunch blood glucose is taken between 2-3 pm. Her lunch blood glucose range is generally 140-180 mg/dl. Her dinner blood glucose is taken between 7-8 pm. Her dinner blood glucose range is generally 140-180 mg/dl. Her highest blood glucose is >200 mg/dl. Her overall blood glucose range is 140-180 mg/dl. An ACE inhibitor/angiotensin  II receptor blocker is being taken. She does not see a podiatrist.Eye exam is not current.  Hyperlipidemia  This is a chronic problem. The current episode started more than 1 year ago. The problem is uncontrolled. Recent lipid tests were reviewed and are high. Exacerbating diseases include diabetes and obesity. Pertinent negatives include no chest pain, myalgias or shortness of breath. Current antihyperlipidemic treatment includes statins. The current treatment provides moderate improvement of lipids. Compliance problems include adherence to diet and adherence to exercise.  Risk  factors for coronary artery disease include diabetes mellitus, hypertension, obesity and post-menopausal.  Hypertension  This is a chronic problem. The current episode started more than 1 year ago. The problem is controlled. Pertinent negatives include no chest pain or shortness of breath. Risk factors for coronary artery disease include diabetes mellitus, dyslipidemia, obesity and post-menopausal state. Past treatments include diuretics and ACE inhibitors. The current treatment provides moderate improvement. Compliance problems include diet and exercise.   GAD Xanax .5 as needed- keeps her calm Neuropathy feet Patient currently taking neurotin daily which helps some with burning sensation. Urinary urgency myrbetriq daily helps a lot with urgency feelings     Review of Systems  Constitutional: Negative.   HENT: Negative.   Eyes: Negative.   Respiratory: Negative.  Negative for shortness of breath.   Cardiovascular: Negative.  Negative for chest pain.  Gastrointestinal: Negative.   Endocrine: Negative.  Negative for polydipsia and polyphagia.  Genitourinary: Negative.   Musculoskeletal: Negative.  Negative for myalgias.  Skin: Negative.   Neurological: Positive for numbness. Negative for dizziness.  Hematological: Negative.   Psychiatric/Behavioral: Negative for confusion.     Objective:   Physical Exam  Constitutional: She is oriented to person, place, and time. Vital signs are normal. She appears well-developed and well-nourished.  HENT:  Right Ear: Hearing, tympanic membrane, external ear and ear canal normal.  Left Ear: Hearing, tympanic membrane, external ear and ear canal normal.  Nose: Nose normal.  Mouth/Throat: Uvula is midline and oropharynx is clear and moist.  Eyes: Conjunctivae and EOM are normal.  Neck: Trachea normal, normal range of motion and full passive range of motion without pain. Neck supple. No JVD present. Carotid bruit is not present. No thyromegaly  present.  Cardiovascular: Normal rate, regular rhythm, normal heart sounds and intact distal pulses.  Exam reveals no gallop and no friction rub.   No murmur heard. Pulmonary/Chest: Effort normal and breath sounds normal.  Abdominal: Soft. Normal appearance and bowel sounds are normal. She exhibits no distension and no mass. There is no tenderness.  Musculoskeletal: Normal range of motion. She exhibits edema.  1+ edema in right foot  Lymphadenopathy:    She has no cervical adenopathy.  Neurological: She is alert and oriented to person, place, and time. She has normal reflexes.  Skin: Skin is warm and dry.  Psychiatric: She has a normal mood and affect. Her behavior is normal. Judgment and thought content normal.  Vitals reviewed.   BP 91/70   Pulse 83   Temp 97.4 F (36.3 C) (Oral)   Ht 5' 1" (1.549 m)   Wt 163 lb (73.9 kg)   LMP 01/27/1983   BMI 30.80 kg/m   hgba1c 9.3%     Assessment & Plan:  1. Essential hypertension, benign Do not add salt to diet - CMP14+EGFR - lisinopril (PRINIVIL,ZESTRIL) 20 MG tablet; Take 1 tablet (20 mg total) by mouth daily.  Dispense: 90 tablet; Refill: 1  2. Hyperlipidemia with target LDL less  than 100 Low fiat diet - Lipid panel - simvastatin (ZOCOR) 80 MG tablet; TAKE 1 TABLET (80 MG TOTAL) BY MOUTH DAILY.  Dispense: 90 tablet; Refill: 1 - fenofibrate 160 MG tablet; Take 1 tablet (160 mg total) by mouth daily.  Dispense: 90 tablet; Refill: 1  3. Type 2 diabetes mellitus with diabetic neuropathy, without long-term current use of insulin (HCC) continue to watch carbs in diet Will discuss with t. eckerd at appointment - Bayer DCA Hb A1c Waived - empagliflozin (JARDIANCE) 25 MG TABS tablet; Take 25 mg by mouth daily.  Dispense: 30 tablet; Refill: 5 - gabapentin (NEURONTIN) 300 MG capsule; Take 1 capsule (300 mg total) by mouth at bedtime.  Dispense: 90 capsule; Refill: 1 - metFORMIN (GLUCOPHAGE-XR) 750 MG 24 hr tablet; Take 1 tablet (750 mg  total) by mouth daily with breakfast.  Dispense: 90 tablet; Refill: 1 - insulin lispro (HUMALOG) 100 UNIT/ML injection; Sliding scale up to 8u TID  Dispense: 10 mL; Refill: 11  4. BMI 31.0-31.9,adult Discussed diet and exercise for person with BMI >25 Will recheck weight in 3-6 months  5. GAD (generalized anxiety disorder) Stress management - ALPRAZolam (XANAX) 1 MG tablet; Take 1 tablet (1 mg total) by mouth 2 (two) times daily as needed for anxiety.  Dispense: 60 tablet; Refill: 1  6. Peripheral edema Elevate legs when sitting - furosemide (LASIX) 40 MG tablet; Take 1 tablet (40 mg total) by mouth daily.  Dispense: 90 tablet; Refill: 1  7. Urinary urgency - mirabegron ER (MYRBETRIQ) 25 MG TB24 tablet; Take 1 tablet (25 mg total) by mouth daily.  Dispense: 30 tablet; Refill: 5    Labs pending Health maintenance reviewed Diet and exercise encouraged Continue all meds Follow up  In 3 month   Humphreys, FNP

## 2016-06-02 LAB — LIPID PANEL
CHOLESTEROL TOTAL: 262 mg/dL — AB (ref 100–199)
Chol/HDL Ratio: 6 ratio units — ABNORMAL HIGH (ref 0.0–4.4)
HDL: 44 mg/dL (ref >39)
LDL Calculated: 150 mg/dL — ABNORMAL HIGH (ref 0–99)
TRIGLYCERIDES: 339 mg/dL — AB (ref 0–149)
VLDL CHOLESTEROL CAL: 68 mg/dL — AB (ref 5–40)

## 2016-06-02 LAB — CMP14+EGFR
ALK PHOS: 100 IU/L (ref 39–117)
ALT: 25 IU/L (ref 0–32)
AST: 19 IU/L (ref 0–40)
Albumin/Globulin Ratio: 1.7 (ref 1.2–2.2)
Albumin: 4.3 g/dL (ref 3.6–4.8)
BILIRUBIN TOTAL: 0.2 mg/dL (ref 0.0–1.2)
BUN / CREAT RATIO: 17 (ref 12–28)
BUN: 17 mg/dL (ref 8–27)
CHLORIDE: 103 mmol/L (ref 96–106)
CO2: 24 mmol/L (ref 18–29)
Calcium: 9.8 mg/dL (ref 8.7–10.3)
Creatinine, Ser: 1.01 mg/dL — ABNORMAL HIGH (ref 0.57–1.00)
GFR calc Af Amer: 66 mL/min/{1.73_m2} (ref >59)
GFR calc non Af Amer: 57 mL/min/{1.73_m2} — ABNORMAL LOW (ref >59)
GLUCOSE: 138 mg/dL — AB (ref 65–99)
Globulin, Total: 2.5 g/dL (ref 1.5–4.5)
Potassium: 5.2 mmol/L (ref 3.5–5.2)
Sodium: 142 mmol/L (ref 134–144)
Total Protein: 6.8 g/dL (ref 6.0–8.5)

## 2016-06-03 ENCOUNTER — Ambulatory Visit (INDEPENDENT_AMBULATORY_CARE_PROVIDER_SITE_OTHER): Payer: Medicare Other | Admitting: Pharmacist

## 2016-06-03 ENCOUNTER — Encounter: Payer: Self-pay | Admitting: Pharmacist

## 2016-06-03 ENCOUNTER — Other Ambulatory Visit: Payer: Self-pay | Admitting: Nurse Practitioner

## 2016-06-03 VITALS — BP 138/76 | HR 84 | Ht 61.0 in | Wt 163.0 lb

## 2016-06-03 DIAGNOSIS — E1140 Type 2 diabetes mellitus with diabetic neuropathy, unspecified: Secondary | ICD-10-CM

## 2016-06-03 DIAGNOSIS — I10 Essential (primary) hypertension: Secondary | ICD-10-CM

## 2016-06-03 DIAGNOSIS — Z8673 Personal history of transient ischemic attack (TIA), and cerebral infarction without residual deficits: Secondary | ICD-10-CM

## 2016-06-03 DIAGNOSIS — I672 Cerebral atherosclerosis: Secondary | ICD-10-CM

## 2016-06-03 DIAGNOSIS — I6523 Occlusion and stenosis of bilateral carotid arteries: Secondary | ICD-10-CM

## 2016-06-03 DIAGNOSIS — F411 Generalized anxiety disorder: Secondary | ICD-10-CM

## 2016-06-03 MED ORDER — FLUCONAZOLE 150 MG PO TABS: 150.0000 mg | ORAL_TABLET | Freq: Once | ORAL | 0 refills | Status: AC

## 2016-06-03 MED ORDER — FLUCONAZOLE 150 MG PO TABS: 150.0000 mg | ORAL_TABLET | Freq: Once | ORAL | 0 refills | Status: DC

## 2016-06-03 NOTE — Patient Instructions (Signed)
Check blood glucose each morning. If reading is over 130 then increase Lantus by 1 unit.  Continue to increase by 1 unit a day until morning blood glucose is less than 130.

## 2016-06-03 NOTE — Progress Notes (Addendum)
Patient ID: Kylie Ward, female   DOB: [birth year], 70 y.o.   MRN: [id number]   Subjective:    Kylie Ward is a 70 y.o. female who presents for diabetes education and medication adjustment related to uncontrolled, Type 2 diabetes mellitus.  Current symptoms/problems include hyperglycemia and have been worsening. Symptoms have been present for 3 months.  Known diabetic complications: peripheral neuropathy Cardiovascular risk factors: advanced age (older than 2 for men, 36 for women), diabetes mellitus, dyslipidemia, obesity (BMI >= 30 kg/m2) and sedentary lifestyle Current diabetic medications include metformin XR 750mg  qd (in past has diarrhea with regular release metformin and higher doses),  Lantus 64 units qhs and Humalog per sliding scale up to 6 to 12 units tid. Jardiance 25mg  1 tablet daily  Eye exam current (within one year): no Weight trend: stable Prior visit with CDE: yes  Current diet: in general, an "unhealthy" diet - often skips lunch or breakfast and then eats a large dinner and continues to snack until late in the evening.  Current exercise: walking but only ocassionally  Current monitoring regimen: checkes BG 2 to 3 times a day Home blood sugar records: 107 to 250 Any episodes of hypoglycemia? no  Is She on ACE inhibitor or angiotensin II receptor blocker?  Yes  lisinopril 20 1 tablet daily   Patient also continues to need pad to protect from urinary incontinence.  She has rx for Myrbetriq 25mg  qd but she states she is only taking prn.      The following portions of the patient's history were reviewed and updated as appropriate: allergies, current medications, past medical history and problem list.   Objective:    BP 138/76   Pulse 84   Ht 5\' 1"  (1.549 m)   Wt 163 lb (73.9 kg)   LMP 01/27/1983   BMI 30.80 kg/m   Vitals:   06/03/16 1434 06/03/16 1442  BP: (!) 145/77 138/76  Pulse: 84    Filed Weights   06/03/16 1434  Weight: 163 lb (73.9 kg)    Body mass index is 30.8 kg/m.  A1c = 9.3% (06/03/2016) A1c = 8.8% (02/12/2016) A1c = 11.4% (11/13/2015)  Lab Review Glucose (mg/dL)  Date Value  19/41/7408 138 (H)  02/12/2016 87  12/05/2015 122 (H)   Glucose, Bld (mg/dL)  Date Value  14/48/1856 90  01/26/2013 123 (H)   CO2 (mmol/L)  Date Value  06/01/2016 24  02/12/2016 21  12/05/2015 23   BUN (mg/dL)  Date Value  31/49/7026 17  02/12/2016 18  12/05/2015 19   Creat (mg/dL)  Date Value  37/85/8850 0.77  01/26/2013 0.88   Creatinine, Ser (mg/dL)  Date Value  27/74/1287 1.01 (H)  02/12/2016 0.80  12/05/2015 0.80   Assessment:    Diabetes Mellitus type II, under inadequate control.    Plan:    1.  Rx changes:   Increase Lantus by 1 unit daily until morning BG is 130 or less.  Also instructed to call if experiences hypoglycemia so make med  Insulin adjustments.  Continue Jardiance 25mg  take 1 tablet qam  - gave #28 samples  Continue current doses of metformin and Humalog per SS  Instructed to try taking Myrbetriq 25mg  daily to see if urinary incontinence improves.  Reminded that there is a 50mg  dose also that can be tried is she continues to have urinary symptoms. 2.  Education: Reviewed 'ABCs' of diabetes management (respective goals in parentheses):  A1C (<7), blood pressure (<130/80), and cholesterol (LDL <  100). 3. Discussed CHO counting diet.  At least 20 minutes spent discussing diet.  In particular recommended she eat 3 small meals of 45 grams of CHO or less and 2-3 snack of 15 grams of CHO or less. 4.  Recommended restart exercise - start with 10 minutes and increase as able to 30 to 40 minutes at least 4 days per week.   5.   Follow up: 3 weeks     Orders Placed This Encounter  Procedures  . Ambulatory referral to Cardiology    Referral Priority:   Routine    Referral Type:   Consultation    Referral Reason:   Specialty Services Required    Referred to Provider:   Rollene Rotunda, MD     Requested Specialty:   Cardiology    Number of Visits Requested:   1    Henrene Pastor, PharmD, CPP, CDE

## 2016-06-05 ENCOUNTER — Other Ambulatory Visit: Payer: Self-pay | Admitting: Nurse Practitioner

## 2016-06-07 ENCOUNTER — Telehealth: Payer: Self-pay | Admitting: Nurse Practitioner

## 2016-07-06 ENCOUNTER — Telehealth: Payer: Self-pay | Admitting: Nurse Practitioner

## 2016-07-07 ENCOUNTER — Ambulatory Visit (INDEPENDENT_AMBULATORY_CARE_PROVIDER_SITE_OTHER): Payer: Medicare Other | Admitting: Pharmacist

## 2016-07-07 ENCOUNTER — Encounter: Payer: Self-pay | Admitting: Pharmacist

## 2016-07-07 VITALS — BP 134/80 | HR 70 | Ht 61.0 in | Wt 162.0 lb

## 2016-07-07 DIAGNOSIS — R42 Dizziness and giddiness: Secondary | ICD-10-CM

## 2016-07-07 DIAGNOSIS — E1143 Type 2 diabetes mellitus with diabetic autonomic (poly)neuropathy: Secondary | ICD-10-CM

## 2016-07-07 DIAGNOSIS — R300 Dysuria: Secondary | ICD-10-CM | POA: Diagnosis not present

## 2016-07-07 DIAGNOSIS — N390 Urinary tract infection, site not specified: Secondary | ICD-10-CM

## 2016-07-07 DIAGNOSIS — Z794 Long term (current) use of insulin: Secondary | ICD-10-CM

## 2016-07-07 MED ORDER — GLUCOSE BLOOD VI STRP: ORAL_STRIP | 4 refills | Status: DC

## 2016-07-07 MED ORDER — ACCU-CHEK FASTCLIX LANCETS MISC: 2 refills | Status: DC

## 2016-07-07 MED ORDER — FLUCONAZOLE 150 MG PO TABS: 150.0000 mg | ORAL_TABLET | Freq: Once | ORAL | 0 refills | Status: AC

## 2016-07-07 MED ORDER — INSULIN PEN NEEDLE 31G X 8 MM MISC: 1 refills | Status: DC

## 2016-07-07 NOTE — Patient Instructions (Signed)
Increase Lantus 66 units once a day.  Continue metformin  Wear continuous glucose monitor for 14 days.  Removed 07/21/2016 and return to office. Call me if come off before 07/21/16 or if you have questions Foster Simpsonammy Ecakard - [phone]

## 2016-07-07 NOTE — Progress Notes (Signed)
Patient ID: Kylie Ward, female   DOB: 1946/05/14, 70 y.o.   MRN: [id number]   Subjective:    Kylie Ward is a 70 y.o. female who presents for diabetes education and medication adjustment related to uncontrolled, Type 2 diabetes mellitus requiring insulin therapy.  Current diabetic medications include metformin XR 750mg  qd (in past has diarrhea with regular release metformin and higher doses),  Lantus 64 units qhs and Humalog per sliding scale up to 6 to 12 units tid. Jardiance 25mg  1 tablet daily Patient states that she did not take jardiance for last 2 days because she has had itching and burning vaginally  Current monitoring regimen: checkes BG 2 to 3 times a day Home blood sugar records: 74 to 258 Any episodes of hypoglycemia? Unsure.  Patient describes event that sound like hypoglyecmia (feeling dizzy and fatigued) but no confirmation on glucometer.  She states this happens about twice per week.  Is She on ACE inhibitor or angiotensin II receptor blocker?  Yes  lisinopril 20mg  1 tablet daily   Known diabetic complications: peripheral neuropathy Cardiovascular risk factors: advanced age (older than 72 for men, 67 for women), diabetes mellitus, dyslipidemia, obesity (BMI >= 30 kg/m2) and sedentary lifestyle Eye exam current (within one year): no Weight trend: stable Prior visit with CDE: yes  Current diet: diet has improved over the last 2-3 weeks.  - eating more vegetables and fewer strachy foods and snacks. No regular sodas.  Current exercise: walking but only ocassionally    The following portions of the patient's history were reviewed and updated as appropriate: allergies, current medications, past medical history and problem list.   Objective:    BP 134/80   Pulse 70   Ht 5\' 1"  (1.549 m)   Wt 162 lb (73.5 kg)   LMP 01/27/1983   BMI 30.61 kg/m   Vitals:   07/07/16 1503  BP: 134/80  Pulse: 70   Filed Weights   07/07/16 1503  Weight: 162 lb (73.5 kg)    Body mass index is 30.61 kg/m.  A1c = 9.3% (06/03/2016) A1c = 8.8% (02/12/2016) A1c = 11.4% (11/13/2015)  Lab Review Glucose (mg/dL)  Date Value  23/55/7322 138 (H)  02/12/2016 87  12/05/2015 122 (H)   Glucose, Bld (mg/dL)  Date Value  02/54/2706 90  01/26/2013 123 (H)   CO2 (mmol/L)  Date Value  06/01/2016 24  02/12/2016 21  12/05/2015 23   BUN (mg/dL)  Date Value  23/76/2831 17  02/12/2016 18  12/05/2015 19   Creat (mg/dL)  Date Value  51/76/1607 0.77  01/26/2013 0.88   Creatinine, Ser (mg/dL)  Date Value  37/08/6268 1.01 (H)  02/12/2016 0.80  12/05/2015 0.80   Assessment:    Diabetes Mellitus type II, under inadequate control.   Vaginal yeast infection related to jardiance   Plan:    1.  Rx changes:   Increase Lantus to 66 units once a day  Hold Jardiance for 2-3 days until yeast infection resolves and then restart.  If returns might need to discontinue  Diflucan 150 mg 1 tablet for 1 dose - call if itching / burning doe snto improve after 3 days  Continue current doses of metformin and Humalog per SS 2.  Continuous Glucose monitor was placed today to try to get better understanding of BG trends.  Patient educated about proper care of CGM and site.  CGM was placed on back of upper left arm.  Patient will wear at least 5 days  and up to 14 days.  She is to keep BG, diet and insulin administration records. She can engage in regular activities with special care when showering, changing clothes and swimming (only up to 3 feet and for 30 minutes at a time) 3.  Education: Reviewed 'ABCs' of diabetes management (respective goals in parentheses):  A1C (<7), blood pressure (<130/80), and cholesterol (LDL <100). 4. Discussed CHO counting diet.  At least 20 minutes spent discussing diet.  In particular recommended she eat 3 small meals of 45 grams of CHO or less and 2-3 snack of 15 grams of CHO or less. 5.  Recommended restart exercise - start with 10 minutes and  increase as able to 30 to 40 minutes at least 4 days per week.   6.   Follow up: 2 weeks     Orders Placed This Encounter  Procedures  . Urinalysis, Routine w reflex microscopic ([facility]) - patient was unable to void in office    Standing Status:   Future    Standing Expiration Date:   09/06/2016    Henrene Pastorammy Shaundrea Carrigg, PharmD, CPP, CDE

## 2016-07-08 ENCOUNTER — Other Ambulatory Visit: Payer: Self-pay | Admitting: Pharmacist

## 2016-07-08 DIAGNOSIS — N390 Urinary tract infection, site not specified: Secondary | ICD-10-CM

## 2016-07-08 DIAGNOSIS — R300 Dysuria: Secondary | ICD-10-CM

## 2016-07-08 DIAGNOSIS — N3000 Acute cystitis without hematuria: Secondary | ICD-10-CM

## 2016-07-08 LAB — URINALYSIS, ROUTINE W REFLEX MICROSCOPIC
BILIRUBIN UA: NEGATIVE
Ketones, UA: NEGATIVE
Nitrite, UA: POSITIVE — AB
PH UA: 5.5 (ref 5.0–7.5)
PROTEIN UA: NEGATIVE
Specific Gravity, UA: 1.02 (ref 1.005–1.030)
UUROB: 0.2 mg/dL (ref 0.2–1.0)

## 2016-07-08 LAB — MICROSCOPIC EXAMINATION: RBC, UA: >30 /hpf — AB (ref 0–?)

## 2016-07-08 MED ORDER — SULFAMETHOXAZOLE-TRIMETHOPRIM 800-160 MG PO TABS: 1.0000 | ORAL_TABLET | Freq: Two times a day (BID) | ORAL | 0 refills | Status: DC

## 2016-07-08 NOTE — Addendum Note (Signed)
Addended by: Prescott GumLAND, Ibtisam Benge M on: 07/08/2016 03:22 PM   Modules accepted: Orders

## 2016-07-08 NOTE — Addendum Note (Signed)
Addended by: Margorie JohnJOHNSON, Maribelle Hopple M on: 07/08/2016 10:11 AM   Modules accepted: Orders

## 2016-07-10 LAB — URINE CULTURE

## 2016-07-19 ENCOUNTER — Telehealth: Payer: Self-pay | Admitting: Nurse Practitioner

## 2016-07-19 NOTE — Telephone Encounter (Signed)
Patient is due to have CGM removed 07/21/16 - appt given

## 2016-07-21 ENCOUNTER — Ambulatory Visit (INDEPENDENT_AMBULATORY_CARE_PROVIDER_SITE_OTHER): Payer: Medicare Other | Admitting: Pharmacist

## 2016-07-21 ENCOUNTER — Encounter: Payer: Self-pay | Admitting: Family Medicine

## 2016-07-21 ENCOUNTER — Ambulatory Visit (INDEPENDENT_AMBULATORY_CARE_PROVIDER_SITE_OTHER): Payer: Medicare Other | Admitting: Family Medicine

## 2016-07-21 VITALS — BP 129/65 | Temp 97.3°F | Ht 61.0 in | Wt 164.2 lb

## 2016-07-21 DIAGNOSIS — M7062 Trochanteric bursitis, left hip: Secondary | ICD-10-CM

## 2016-07-21 DIAGNOSIS — E1140 Type 2 diabetes mellitus with diabetic neuropathy, unspecified: Secondary | ICD-10-CM

## 2016-07-21 MED ORDER — INSULIN GLARGINE-LIXISENATIDE 100-33 UNT-MCG/ML ~~LOC~~ SOPN: 30.0000 [IU] | PEN_INJECTOR | Freq: Every day | SUBCUTANEOUS | 1 refills | Status: DC

## 2016-07-21 MED ORDER — INSULIN GLARGINE 100 UNIT/ML ~~LOC~~ SOLN: 60.0000 [IU] | Freq: Every day | SUBCUTANEOUS | 0 refills | Status: DC

## 2016-07-21 NOTE — Progress Notes (Signed)
BP 129/65   Temp 97.3 F (36.3 C) (Oral)   Ht '5\' 1"'  (1.549 m)   Wt 164 lb 3.2 oz (74.5 kg)   LMP 01/27/1983   BMI 31.03 kg/m    Subjective:    Patient ID: Kylie Ward, female    DOB: 1946-07-02, 70 y.o.   MRN: [id number]  HPI: Kylie Ward is a 70 y.o. female presenting on 07/21/2016 for hip pain x 1 week   HPI Hip pain Patient comes in with left lateral hip pain is been going on for about a week. She is concerned about the hip joint itself and wondering if she needs an x-ray or something like that. He does not radiate into her back or into her groin. The hip pain is just on the lateral aspect of her hip. She denies any fevers or chills or erythema or warmth. She denies any numbness or weakness. The pain has made her gait more difficult because of how much it hurts. The pain is 5 out of 10  Relevant past medical, surgical, family and social history reviewed and updated as indicated. Interim medical history since our last visit reviewed. Allergies and medications reviewed and updated.  Review of Systems  Constitutional: Negative for chills and fever.  HENT: Negative for congestion, ear discharge and ear pain.   Eyes: Negative for redness and visual disturbance.  Respiratory: Negative for chest tightness and shortness of breath.   Cardiovascular: Negative for chest pain and leg swelling.  Genitourinary: Negative for difficulty urinating and dysuria.  Musculoskeletal: Positive for arthralgias and gait problem. Negative for back pain and joint swelling.  Skin: Negative for rash.  Neurological: Negative for light-headedness and headaches.  Psychiatric/Behavioral: Negative for agitation and behavioral problems.  All other systems reviewed and are negative.   Per HPI unless specifically indicated above     Medication List       Accurate as of 07/21/16  6:25 PM. Always use your most recent med list.          ACCU-CHEK FASTCLIX LANCETS Misc Test tid, DX E11.9     ALPRAZolam 1 MG tablet Commonly known as:  XANAX Take 1 tablet (1 mg total) by mouth 2 (two) times daily as needed for anxiety.   aspirin 81 MG tablet Take 81 mg by mouth daily.   blood glucose meter kit and supplies 1 each by Other route 4 (four) times daily -  with meals and at bedtime.   empagliflozin 25 MG Tabs tablet Commonly known as:  JARDIANCE Take 25 mg by mouth daily.   fenofibrate 160 MG tablet Take 1 tablet (160 mg total) by mouth daily.   furosemide 40 MG tablet Commonly known as:  LASIX Take 1 tablet (40 mg total) by mouth daily.   gabapentin 300 MG capsule Commonly known as:  NEURONTIN Take 1 capsule (300 mg total) by mouth at bedtime.   glucose blood test strip Commonly known as:  ACCU-CHEK SMARTVIEW Check blood sugar tid. DX E11.42   insulin glargine 100 UNIT/ML injection Commonly known as:  LANTUS Inject 0.6 mLs (60 Units total) into the skin at bedtime. Do not fill - note change in dose.   Insulin Glargine-Lixisenatide 100-33 UNT-MCG/ML Sopn Commonly known as:  SOLIQUA Inject 30-60 Units into the skin daily. Requesting from patient assistance program   insulin lispro 100 UNIT/ML injection Commonly known as:  HUMALOG Sliding scale up to 8u TID   Insulin Pen Needle 31G X 8 MM Misc Use  to inject Lantus insulin once a day   lisinopril 20 MG tablet Commonly known as:  PRINIVIL,ZESTRIL Take 1 tablet (20 mg total) by mouth daily.   metFORMIN 750 MG 24 hr tablet Commonly known as:  GLUCOPHAGE-XR Take 1 tablet (750 mg total) by mouth daily with breakfast.   mirabegron ER 25 MG Tb24 tablet Commonly known as:  MYRBETRIQ Take 1 tablet (25 mg total) by mouth daily.   simvastatin 80 MG tablet Commonly known as:  ZOCOR TAKE 1 TABLET (80 MG TOTAL) BY MOUTH DAILY.   sulfamethoxazole-trimethoprim 800-160 MG tablet Commonly known as:  BACTRIM DS,SEPTRA DS Take 1 tablet by mouth 2 (two) times daily.   V-2 HIGH COMPRESSION HOSE Misc 1 each by Does not  apply route daily.          Objective:    BP 129/65   Temp 97.3 F (36.3 C) (Oral)   Ht '5\' 1"'  (1.549 m)   Wt 164 lb 3.2 oz (74.5 kg)   LMP 01/27/1983   BMI 31.03 kg/m   Wt Readings from Last 3 Encounters:  07/21/16 164 lb 3.2 oz (74.5 kg)  07/07/16 162 lb (73.5 kg)  06/03/16 163 lb (73.9 kg)    Physical Exam  Constitutional: She is oriented to person, place, and time. She appears well-developed and well-nourished. No distress.  Eyes: Conjunctivae are normal.  Cardiovascular: Normal rate, regular rhythm, normal heart sounds and intact distal pulses.   No murmur heard. Pulmonary/Chest: Effort normal and breath sounds normal. No respiratory distress. She has no wheezes.  Musculoskeletal: Normal range of motion. She exhibits no edema.       Left hip: She exhibits tenderness. She exhibits normal range of motion, normal strength, no deformity and no laceration.       Legs: Neurological: She is alert and oriented to person, place, and time. Coordination normal.  Skin: Skin is warm and dry. No rash noted. She is not diaphoretic.  Psychiatric: She has a normal mood and affect. Her behavior is normal.  Nursing note and vitals reviewed.     Assessment & Plan:   Problem List Items Addressed This Visit    None    Visit Diagnoses    Trochanteric bursitis of left hip    -  Primary   Patient is a diabetic so we will avoid steroids at this point, recommend anti-inflammatories and stretching and ice and return if not improved       Follow up plan: Return if symptoms worsen or fail to improve.  Counseling provided for all of the vaccine components No orders of the defined types were placed in this encounter.   Caryl Pina, MD [facility] 07/21/2016, 6:25 PM

## 2016-07-21 NOTE — Patient Instructions (Signed)
Decrease Lantus to 60 units once a day at bedtime  Increase Humalog dose at evening meal - add 4 units to what current sliding scale indicates.   Goal Blood glucose:    Fasting (before meals) = 80 to 130   Within 2 hours of eating = less than 180  Try to have no more than 3 of these foods per meal:  Fruit:   1/2 cup or once piece (baseball size)- apples, pears, pineapple, peaches, oranges  1 cup - berries, melons  1/2 banana or grapefruit  Stachy Vegetables:   1/2 cup potatoes (white or sweet), corn, peas  Other starches:   1 piece of bread  1/2 cup rice or pasta  4 inch pancake    These foods you can eat more freely: Proteins:   Fish  Chicken or [place]urkey  Beef or pork (1 or 2 servings per week)  Eggs  Nuts (peanuts, walnuts, almonds, pistachios)  Cheese  Non starchy vegetables:  Green beans  Broccoli or cauliflower  Lettuce, greens, cabbage  Brussel Sprout  Carrots  Onions and peppers  Celery  Tomatoes  Asparagus  Eggplant  Cucumbers  Squash and Zucchini

## 2016-07-21 NOTE — Progress Notes (Signed)
Patient ID: Kylie Ward, female   DOB: [birth year], 70 y.o.   MRN: [id number]   Patient was here to reviews results from Continuous Glucose Monitor however she c/o left hip pain for last 2 days with numbness in leg and also numbness in left finger / hand.  Patient was given appt to see Dr Dettinger today.   I did review her reports from CGM.  Received 14 days of date.  Average BG was 195 A1c estimate for 14 days was 8.4%  Trends from CMG:  Significant drops in BG overnight Gradual rise in BG starting around noon or afternoon with highest BG in the pm between 6pm and midnight.  2 nocturnal hypoglycemic events with unawareness during 14 days on CGM  Plan: Decrease Lantus to 60 units at bedtime  Increase Novolog by 4 units at supper meal.  Patient to check BG prior to supper and after supper for the next 5 days. Patient brought in paperwork for Sandofi / Lantus PAP.  We discussed trying Soliqua instead of just Lantus.  Will request PAP for Soliqua.

## 2016-07-21 NOTE — Addendum Note (Signed)
Addended by: Henrene PastorECKARD, Adelyn Roscher on: 07/21/2016 05:11 PM   Modules accepted: Orders

## 2016-08-04 ENCOUNTER — Encounter: Payer: Self-pay | Admitting: Cardiology

## 2016-08-05 ENCOUNTER — Other Ambulatory Visit: Payer: Self-pay | Admitting: Nurse Practitioner

## 2016-08-05 NOTE — Telephone Encounter (Signed)
TC back to pt - informed she would need to be seen. Will call back to make that appt

## 2016-08-05 NOTE — Telephone Encounter (Signed)
Will need to be seen

## 2016-08-05 NOTE — Telephone Encounter (Signed)
Pt states that her hip / back pain is no better since she saw Dr. Louanne Skyeettinger. She has tried all OTC anti-inflammatories without any relief [facility]

## 2016-08-17 NOTE — Progress Notes (Deleted)
Cardiology Office Note   Date:  08/17/2016   ID:  Kylie Ward, DOB [birth year], MRN [id number]  PCP:  Chevis Pretty, FNP  Cardiologist:   Minus Breeding, MD  Referring:  ***  No chief complaint on file.     History of Present Illness: Kylie Ward is a 70 y.o. female who presents for ***    Past Medical History:  Diagnosis Date  . Anxiety   . Diabetes mellitus without complication ([place])   . Hyperlipidemia   . Hypertension   . Stroke [facility])     Past Surgical History:  Procedure Laterality Date  . CAROTID ENDARTERECTOMY Right   . CAROTID STENT       Current Outpatient Prescriptions  Medication Sig Dispense Refill  . ACCU-CHEK FASTCLIX LANCETS MISC Test tid, DX E11.9 102 each 2  . ALPRAZolam (XANAX) 1 MG tablet Take 1 tablet (1 mg total) by mouth 2 (two) times daily as needed for anxiety. 60 tablet 1  . aspirin 81 MG tablet Take 81 mg by mouth daily.    . blood glucose meter kit and supplies 1 each by Other route 4 (four) times daily -  with meals and at bedtime. 1 each 0  . Elastic Bandages & Supports (V-2 HIGH COMPRESSION HOSE) MISC 1 each by Does not apply route daily. (Patient not taking: Reported on 07/21/2016) 1 each 0  . empagliflozin (JARDIANCE) 25 MG TABS tablet Take 25 mg by mouth daily. (Patient not taking: Reported on 07/21/2016) 30 tablet 5  . fenofibrate 160 MG tablet Take 1 tablet (160 mg total) by mouth daily. 90 tablet 1  . furosemide (LASIX) 40 MG tablet Take 1 tablet (40 mg total) by mouth daily. (Patient not taking: Reported on 07/21/2016) 90 tablet 1  . gabapentin (NEURONTIN) 300 MG capsule Take 1 capsule (300 mg total) by mouth at bedtime. 90 capsule 1  . glucose blood (ACCU-CHEK SMARTVIEW) test strip Check blood sugar tid. DX E11.42 100 each 4  . insulin glargine (LANTUS) 100 UNIT/ML injection Inject 0.6 mLs (60 Units total) into the skin at bedtime. Do not fill - note change in dose. 30 mL 0  . Insulin Glargine-Lixisenatide  (SOLIQUA) 100-33 UNT-MCG/ML SOPN Inject 30-60 Units into the skin daily. Requesting from patient assistance program (Patient not taking: Reported on 07/21/2016) 30 mL 1  . insulin lispro (HUMALOG) 100 UNIT/ML injection Sliding scale up to 8u TID 10 mL 11  . Insulin Pen Needle 31G X 8 MM MISC Use to inject Lantus insulin once a day 100 each 1  . lisinopril (PRINIVIL,ZESTRIL) 20 MG tablet Take 1 tablet (20 mg total) by mouth daily. 90 tablet 1  . metFORMIN (GLUCOPHAGE-XR) 750 MG 24 hr tablet Take 1 tablet (750 mg total) by mouth daily with breakfast. 90 tablet 1  . mirabegron ER (MYRBETRIQ) 25 MG TB24 tablet Take 1 tablet (25 mg total) by mouth daily. (Patient not taking: Reported on 07/21/2016) 30 tablet 5  . simvastatin (ZOCOR) 80 MG tablet TAKE 1 TABLET (80 MG TOTAL) BY MOUTH DAILY. 90 tablet 1  . sulfamethoxazole-trimethoprim (BACTRIM DS,SEPTRA DS) 800-160 MG tablet Take 1 tablet by mouth 2 (two) times daily. 14 tablet 0   No current facility-administered medications for this visit.     Allergies:   Lipitor [atorvastatin] and Niaspan [niacin er]    Social History:  The patient  reports that she quit smoking about 6 years ago. She quit smokeless tobacco use about 5 years ago. She  reports that she does not drink alcohol or use drugs.   Family History:  The patient's ***family history includes Diabetes in her brother, brother, brother, sister, sister, and sister; Heart attack in her father; Heart disease in her father; Hypertension in her father; Stroke in her sister.    ROS:  Please see the history of present illness.   Otherwise, review of systems are positive for {NONE DEFAULTED:18576::"none"}.   All other systems are reviewed and negative.    PHYSICAL EXAM: VS:  LMP 01/27/1983  , BMI There is no height or weight on file to calculate BMI. GENERAL:  Well appearing HEENT:  Pupils equal round and reactive, fundi not visualized, oral mucosa unremarkable NECK:  No jugular venous distention,  waveform within normal limits, carotid upstroke brisk and symmetric, no bruits, no thyromegaly LYMPHATICS:  No cervical, inguinal adenopathy LUNGS:  Clear to auscultation bilaterally BACK:  No CVA tenderness CHEST:  Unremarkable HEART:  PMI not displaced or sustained,S1 and S2 within normal limits, no S3, no S4, no clicks, no rubs, *** murmurs ABD:  Flat, positive bowel sounds normal in frequency in pitch, no bruits, no rebound, no guarding, no midline pulsatile mass, no hepatomegaly, no splenomegaly EXT:  2 plus pulses throughout, no edema, no cyanosis no clubbing SKIN:  No rashes no nodules NEURO:  Cranial nerves II through XII grossly intact, motor grossly intact throughout PSYCH:  Cognitively intact, oriented to person place and time    EKG:  EKG {ACTION; IS/IS NVV:[id number]} ordered today. The ekg ordered today demonstrates ***   Recent Labs: 12/05/2015: TSH 0.753 06/01/2016: ALT 25; BUN 17; Creatinine, Ser 1.01; Potassium 5.2; Sodium 142    Lipid Panel    Component Value Date/Time   CHOL 262 (H) 06/01/2016 1439   CHOL 201 (H) 05/11/2013 1735   TRIG 339 (H) 06/01/2016 1439   TRIG 349 (H) 12/20/2014 1500   TRIG 324 (H) 05/11/2013 1735   HDL 44 06/01/2016 1439   HDL 36 (L) 12/20/2014 1500   HDL 41 05/11/2013 1735   CHOLHDL 6.0 (H) 06/01/2016 1439   LDLCALC 150 (H) 06/01/2016 1439   LDLCALC 56 05/21/2014 1554   LDLCALC 95 05/11/2013 1735      Wt Readings from Last 3 Encounters:  07/21/16 164 lb 3.2 oz (74.5 kg)  07/07/16 162 lb (73.5 kg)  06/03/16 163 lb (73.9 kg)      Other studies Reviewed: Additional studies/ records that were reviewed today include: ***. Review of the above records demonstrates:  Please see elsewhere in the note.  ***   ASSESSMENT AND PLAN:  ***   Current medicines are reviewed at length with the patient today.  The patient {ACTIONS; HAS/DOES NOT HAVE:19233} concerns regarding medicines.  The following changes have been made:  {PLAN; NO  CHANGE:13088:s}  Labs/ tests ordered today include: *** No orders of the defined types were placed in this encounter.    Disposition:   FU with ***    Signed, Minus Breeding, MD  08/17/2016 8:32 PM    [facility]

## 2016-08-18 ENCOUNTER — Ambulatory Visit: Payer: Self-pay | Admitting: Cardiology

## 2016-08-18 ENCOUNTER — Telehealth: Payer: Self-pay | Admitting: *Deleted

## 2016-08-18 NOTE — Telephone Encounter (Signed)
Patient has received Soliqua samples - she is instructed to stop Lantus and start NigerSoliqua.  Directions are to inject 30 units once a day in the morning.  She is to increase by 2 units every 7 days until morning blood glucose is 130 or less.

## 2016-08-18 NOTE — Addendum Note (Signed)
Addended by: Henrene PastorECKARD, Audi Conover on: 08/18/2016 11:42 AM   Modules accepted: Orders

## 2016-08-18 NOTE — Telephone Encounter (Signed)
Patient's husband(Clifford) stopped by asking if patient's insulin has came in

## 2016-08-26 ENCOUNTER — Telehealth: Payer: Self-pay | Admitting: Nurse Practitioner

## 2016-08-26 NOTE — Telephone Encounter (Signed)
BG has increased in am between 200 and 300.  Current Soliqua dose is 34 units Recommend increase Soliqua to 40 units.  Try this over the next 3 days.  Call me on Monday with BG readings.

## 2016-09-02 ENCOUNTER — Ambulatory Visit (INDEPENDENT_AMBULATORY_CARE_PROVIDER_SITE_OTHER): Payer: Medicare Other

## 2016-09-02 ENCOUNTER — Encounter: Payer: Self-pay | Admitting: Nurse Practitioner

## 2016-09-02 ENCOUNTER — Ambulatory Visit (INDEPENDENT_AMBULATORY_CARE_PROVIDER_SITE_OTHER): Payer: Medicare Other | Admitting: Nurse Practitioner

## 2016-09-02 VITALS — BP 134/84 | HR 84 | Temp 97.8°F | Ht 61.0 in | Wt 165.0 lb

## 2016-09-02 DIAGNOSIS — E785 Hyperlipidemia, unspecified: Secondary | ICD-10-CM | POA: Diagnosis not present

## 2016-09-02 DIAGNOSIS — I10 Essential (primary) hypertension: Secondary | ICD-10-CM | POA: Diagnosis not present

## 2016-09-02 DIAGNOSIS — R609 Edema, unspecified: Secondary | ICD-10-CM | POA: Diagnosis not present

## 2016-09-02 DIAGNOSIS — F411 Generalized anxiety disorder: Secondary | ICD-10-CM

## 2016-09-02 DIAGNOSIS — M5442 Lumbago with sciatica, left side: Secondary | ICD-10-CM

## 2016-09-02 DIAGNOSIS — I6523 Occlusion and stenosis of bilateral carotid arteries: Secondary | ICD-10-CM

## 2016-09-02 DIAGNOSIS — Z6831 Body mass index (BMI) 31.0-31.9, adult: Secondary | ICD-10-CM | POA: Diagnosis not present

## 2016-09-02 DIAGNOSIS — Z23 Encounter for immunization: Secondary | ICD-10-CM

## 2016-09-02 DIAGNOSIS — E1140 Type 2 diabetes mellitus with diabetic neuropathy, unspecified: Secondary | ICD-10-CM | POA: Diagnosis not present

## 2016-09-02 DIAGNOSIS — I639 Cerebral infarction, unspecified: Secondary | ICD-10-CM

## 2016-09-02 LAB — BAYER DCA HB A1C WAIVED: HB A1C (BAYER DCA - WAIVED): 8.6 % — ABNORMAL HIGH (ref <7.0)

## 2016-09-02 NOTE — Progress Notes (Signed)
Subjective:    Patient ID: Kylie Ward, female    DOB: July 27, 1946, 70 y.o.   MRN: [id number]    Patient here today for follow up of chronic medical problems.   C/O low back pain that comes and goes- pain increases with walking- sitting and OTC meds will help most of the time- occurs 2-3 days out of the week. Pain radiates into left hip and leg.  Current Outpatient Prescriptions on File Prior to Visit  Medication Sig Dispense Refill  . ACCU-CHEK FASTCLIX LANCETS MISC Test tid, DX E11.9 102 each 2  . ALPRAZolam (XANAX) 1 MG tablet Take 1 tablet (1 mg total) by mouth 2 (two) times daily as needed for anxiety. 60 tablet 1  . aspirin 81 MG tablet Take 81 mg by mouth daily.    . blood glucose meter kit and supplies 1 each by Other route 4 (four) times daily -  with meals and at bedtime. 1 each 0  . Elastic Bandages & Supports (V-2 HIGH COMPRESSION HOSE) MISC 1 each by Does not apply route daily. 1 each 0  . empagliflozin (JARDIANCE) 25 MG TABS tablet Take 25 mg by mouth daily. 30 tablet 5  . fenofibrate 160 MG tablet Take 1 tablet (160 mg total) by mouth daily. 90 tablet 1  . furosemide (LASIX) 40 MG tablet Take 1 tablet (40 mg total) by mouth daily. 90 tablet 1  . gabapentin (NEURONTIN) 300 MG capsule Take 1 capsule (300 mg total) by mouth at bedtime. 90 capsule 1  . glucose blood (ACCU-CHEK SMARTVIEW) test strip Check blood sugar tid. DX E11.42 100 each 4  . Insulin Glargine-Lixisenatide (SOLIQUA) 100-33 UNT-MCG/ML SOPN Inject 30-60 Units into the skin daily. Requesting from patient assistance program 30 mL 1  . insulin lispro (HUMALOG) 100 UNIT/ML injection Sliding scale up to 8u TID 10 mL 11  . Insulin Pen Needle 31G X 8 MM MISC Use to inject Lantus insulin once a day 100 each 1  . lisinopril (PRINIVIL,ZESTRIL) 20 MG tablet Take 1 tablet (20 mg total) by mouth daily. 90 tablet 1  . metFORMIN (GLUCOPHAGE-XR) 750 MG 24 hr tablet Take 1 tablet (750 mg total) by mouth daily with breakfast.  90 tablet 1  . mirabegron ER (MYRBETRIQ) 25 MG TB24 tablet Take 1 tablet (25 mg total) by mouth daily. 30 tablet 5  . simvastatin (ZOCOR) 80 MG tablet TAKE 1 TABLET (80 MG TOTAL) BY MOUTH DAILY. 90 tablet 1  . sulfamethoxazole-trimethoprim (BACTRIM DS,SEPTRA DS) 800-160 MG tablet Take 1 tablet by mouth 2 (two) times daily. 14 tablet 0   No current facility-administered medications on file prior to visit.    Diabetes  She presents for her follow-up diabetic visit. She has type 2 diabetes mellitus. Pertinent negatives for hypoglycemia include no confusion or dizziness. Pertinent negatives for diabetes include no chest pain, no polydipsia, no polyphagia and no visual change ( needs new glasses). Diabetic complications include nephropathy (feet). Risk factors for coronary artery disease include dyslipidemia, hypertension and post-menopausal. Current diabetic treatment includes insulin injections and oral agent (monotherapy). She is compliant with treatment most of the time. Her weight is stable. She has not had a previous visit with a dietitian. She rarely participates in exercise. Her breakfast blood glucose is taken between 9-10 am. Her breakfast blood glucose range is generally 90-110 mg/dl. Her lunch blood glucose is taken between 2-3 pm. Her lunch blood glucose range is generally 140-180 mg/dl. Her dinner blood glucose is taken between 7-8  pm. Her dinner blood glucose range is generally 140-180 mg/dl. Her highest blood glucose is >200 mg/dl. Her overall blood glucose range is 140-180 mg/dl. (Blood sugars still running high- does not watch diet.) An ACE inhibitor/angiotensin II receptor blocker is being taken. She does not see a podiatrist.Eye exam is not current.  Hyperlipidemia  This is a chronic problem. The current episode started more than 1 year ago. The problem is uncontrolled. Recent lipid tests were reviewed and are high. Exacerbating diseases include diabetes and obesity. Pertinent negatives  include no chest pain, myalgias or shortness of breath. Current antihyperlipidemic treatment includes statins. The current treatment provides moderate improvement of lipids. Compliance problems include adherence to diet and adherence to exercise.  Risk factors for coronary artery disease include diabetes mellitus, hypertension, obesity and post-menopausal.  Hypertension  This is a chronic problem. The current episode started more than 1 year ago. The problem is controlled. Pertinent negatives include no chest pain or shortness of breath. Risk factors for coronary artery disease include diabetes mellitus, dyslipidemia, obesity and post-menopausal state. Past treatments include diuretics and ACE inhibitors. The current treatment provides moderate improvement. Compliance problems include diet and exercise.   GAD Xanax .5 as needed- keeps her calm Neuropathy feet Patient currently taking neurotin daily which helps some with burning sensation. Urinary urgency myrbetriq daily helps a lot with urgency feelings     Review of Systems  Constitutional: Negative.   HENT: Negative.   Eyes: Negative.   Respiratory: Negative.  Negative for shortness of breath.   Cardiovascular: Negative.  Negative for chest pain.  Gastrointestinal: Negative.   Endocrine: Negative.  Negative for polydipsia and polyphagia.  Genitourinary: Negative.   Musculoskeletal: Negative.  Negative for myalgias.  Skin: Negative.   Neurological: Positive for numbness. Negative for dizziness.  Hematological: Negative.   Psychiatric/Behavioral: Negative for confusion.     Objective:   Physical Exam  Constitutional: She is oriented to person, place, and time. Vital signs are normal. She appears well-developed and well-nourished.  HENT:  Right Ear: Hearing, tympanic membrane, external ear and ear canal normal.  Left Ear: Hearing, tympanic membrane, external ear and ear canal normal.  Nose: Nose normal.  Mouth/Throat: Uvula is  midline and oropharynx is clear and moist.  Eyes: Conjunctivae and EOM are normal.  Neck: Trachea normal, normal range of motion and full passive range of motion without pain. Neck supple. No JVD present. Carotid bruit is not present. No thyromegaly present.  Cardiovascular: Normal rate, regular rhythm, normal heart sounds and intact distal pulses.  Exam reveals no gallop and no friction rub.   No murmur heard. Pulmonary/Chest: Effort normal and breath sounds normal.  Abdominal: Soft. Normal appearance and bowel sounds are normal. She exhibits no distension and no mass. There is no tenderness.  Musculoskeletal: Normal range of motion. She exhibits edema.  1+ edema in right foot  Lymphadenopathy:    She has no cervical adenopathy.  Neurological: She is alert and oriented to person, place, and time. She has normal reflexes.  Skin: Skin is warm and dry.  Psychiatric: She has a normal mood and affect. Her behavior is normal. Judgment and thought content normal.  Vitals reviewed.   BP 134/84 (BP Location: Left Arm, Cuff Size: Normal)   Pulse 84   Temp 97.8 F (36.6 C) (Oral)   Ht _0  (1.549 m)   Wt 165 lb (74.8 kg)   LMP 01/27/1983   BMI 31.18 kg/m     hgba1c 8.6 down from  9.3 at last visit.     Assessment & Plan:  1. Type 2 diabetes mellitus with diabetic neuropathy, without long-term current use of insulin (HCC) Continue to watch carbs in diet - Bayer DCA Hb A1c Waived - Microalbumin / creatinine urine ratio  2. Essential hypertension, benign Do not add salt to diet - CMP14+EGFR  3. Hyperlipidemia with target LDL less than 100 Low fat diet - Lipid panel  4. Symptomatic stenosis of both carotid arteries Will continue  To watch  5. Cerebrovascular accident (CVA), unspecified mechanism ([place])  6. BMI 31.0-31.9,adult Discussed diet and exercise for person with BMI >25 Will recheck weight in 3-6 months  7. GAD (generalized anxiety disorder) Stress management  8.  Peripheral edema Elevate legs when sitting  9. Acute left-sided low back pain with left-sided sciatica Moist heat Referral to ortho - DG HIP UNILAT W OR W/O PELVIS 2-3 VIEWS LEFT; Future - DG Lumbar Spine 2-3 Views; Future    Labs pending Health maintenance reviewed Diet and exercise encouraged Continue all meds Follow up  In 3 month   Kenny [place], FNP

## 2016-09-02 NOTE — Patient Instructions (Signed)

## 2016-09-03 DIAGNOSIS — E1140 Type 2 diabetes mellitus with diabetic neuropathy, unspecified: Secondary | ICD-10-CM | POA: Diagnosis not present

## 2016-09-03 LAB — CMP14+EGFR
A/G RATIO: 1.5 (ref 1.2–2.2)
ALBUMIN: 4 g/dL (ref 3.6–4.8)
ALK PHOS: 88 IU/L (ref 39–117)
ALT: 23 IU/L (ref 0–32)
AST: 15 IU/L (ref 0–40)
BILIRUBIN TOTAL: 0.3 mg/dL (ref 0.0–1.2)
BUN / CREAT RATIO: 22 (ref 12–28)
BUN: 18 mg/dL (ref 8–27)
CHLORIDE: 102 mmol/L (ref 96–106)
CO2: 25 mmol/L (ref 18–29)
Calcium: 9.5 mg/dL (ref 8.7–10.3)
Creatinine, Ser: 0.81 mg/dL (ref 0.57–1.00)
GFR calc Af Amer: 86 mL/min/{1.73_m2} (ref >59)
GFR calc non Af Amer: 75 mL/min/{1.73_m2} (ref >59)
GLUCOSE: 99 mg/dL (ref 65–99)
Globulin, Total: 2.6 g/dL (ref 1.5–4.5)
POTASSIUM: 5.3 mmol/L — AB (ref 3.5–5.2)
Sodium: 141 mmol/L (ref 134–144)
Total Protein: 6.6 g/dL (ref 6.0–8.5)

## 2016-09-03 LAB — LIPID PANEL
CHOLESTEROL TOTAL: 247 mg/dL — AB (ref 100–199)
Chol/HDL Ratio: 5.9 ratio units — ABNORMAL HIGH (ref 0.0–4.4)
HDL: 42 mg/dL (ref >39)
LDL Calculated: 142 mg/dL — ABNORMAL HIGH (ref 0–99)
Triglycerides: 315 mg/dL — ABNORMAL HIGH (ref 0–149)
VLDL CHOLESTEROL CAL: 63 mg/dL — AB (ref 5–40)

## 2016-09-04 ENCOUNTER — Encounter: Payer: Self-pay | Admitting: Nurse Practitioner

## 2016-09-04 DIAGNOSIS — I700 Atherosclerosis of aorta: Secondary | ICD-10-CM | POA: Insufficient documentation

## 2016-09-04 LAB — MICROALBUMIN / CREATININE URINE RATIO
Creatinine, Urine: 60.5 mg/dL
Microalb/Creat Ratio: 5.8 mg/g creat (ref 0.0–30.0)
Microalbumin, Urine: 3.5 ug/mL

## 2016-09-21 ENCOUNTER — Other Ambulatory Visit: Payer: Self-pay | Admitting: Nurse Practitioner

## 2016-09-21 DIAGNOSIS — E1140 Type 2 diabetes mellitus with diabetic neuropathy, unspecified: Secondary | ICD-10-CM

## 2016-10-05 DIAGNOSIS — M5136 Other intervertebral disc degeneration, lumbar region: Secondary | ICD-10-CM | POA: Diagnosis not present

## 2016-10-06 ENCOUNTER — Ambulatory Visit: Payer: Self-pay | Admitting: Cardiology

## 2016-10-20 NOTE — Progress Notes (Deleted)
Cardiology Office Note   Date:  10/20/2016   ID:  Kylie, Ward [birth year], MRN [id number]  PCP:  Chevis Pretty, FNP  Cardiologist:   Minus Breeding, MD  Referring:  ***  No chief complaint on file.     History of Present Illness: GRASIELA Ward is a 70 y.o. female who presents for ***    Past Medical History:  Diagnosis Date  . Anxiety   . Diabetes mellitus without complication ([place])   . Hyperlipidemia   . Hypertension   . Stroke [facility])     Past Surgical History:  Procedure Laterality Date  . CAROTID ENDARTERECTOMY Right   . CAROTID STENT       Current Outpatient Prescriptions  Medication Sig Dispense Refill  . ACCU-CHEK FASTCLIX LANCETS MISC Test tid, DX E11.9 102 each 2  . ALPRAZolam (XANAX) 1 MG tablet Take 1 tablet (1 mg total) by mouth 2 (two) times daily as needed for anxiety. 60 tablet 1  . aspirin 81 MG tablet Take 81 mg by mouth daily.    . blood glucose meter kit and supplies 1 each by Other route 4 (four) times daily -  with meals and at bedtime. 1 each 0  . Elastic Bandages & Supports (V-2 HIGH COMPRESSION HOSE) MISC 1 each by Does not apply route daily. 1 each 0  . fenofibrate 160 MG tablet Take 1 tablet (160 mg total) by mouth daily. 90 tablet 1  . furosemide (LASIX) 40 MG tablet Take 1 tablet (40 mg total) by mouth daily. 90 tablet 1  . gabapentin (NEURONTIN) 300 MG capsule Take 1 capsule (300 mg total) by mouth at bedtime. 90 capsule 1  . glucose blood (ACCU-CHEK SMARTVIEW) test strip Check blood sugar tid. DX E11.42 100 each 4  . Insulin Glargine-Lixisenatide (SOLIQUA) 100-33 UNT-MCG/ML SOPN Inject 30-60 Units into the skin daily. Requesting from patient assistance program 30 mL 1  . insulin lispro (HUMALOG) 100 UNIT/ML injection Sliding scale up to 8u TID 10 mL 11  . Insulin Pen Needle 31G X 8 MM MISC Use to inject Lantus insulin once a day 100 each 1  . lisinopril (PRINIVIL,ZESTRIL) 20 MG tablet Take 1 tablet (20 mg total) by  mouth daily. 90 tablet 1  . metFORMIN (GLUCOPHAGE-XR) 750 MG 24 hr tablet Take 1 Tablet by mouth once daily with breakfast 90 tablet 0  . mirabegron ER (MYRBETRIQ) 25 MG TB24 tablet Take 1 tablet (25 mg total) by mouth daily. 30 tablet 5  . simvastatin (ZOCOR) 80 MG tablet TAKE 1 TABLET (80 MG TOTAL) BY MOUTH DAILY. 90 tablet 1   No current facility-administered medications for this visit.     Allergies:   Lipitor [atorvastatin] and Niaspan [niacin er]    Social History:  The patient  reports that she quit smoking about 6 years ago. She quit smokeless tobacco use about 5 years ago. She reports that she does not drink alcohol or use drugs.   Family History:  The patient's ***family history includes Diabetes in her brother, brother, brother, sister, sister, and sister; Heart attack in her father; Heart disease in her father; Hypertension in her father; Stroke in her sister.    ROS:  Please see the history of present illness.   Otherwise, review of systems are positive for {NONE DEFAULTED:18576::"none"}.   All other systems are reviewed and negative.    PHYSICAL EXAM: VS:  LMP 01/27/1983  , BMI There is no height or weight on file  to calculate BMI. GENERAL:  Well appearing HEENT:  Pupils equal round and reactive, fundi not visualized, oral mucosa unremarkable NECK:  No jugular venous distention, waveform within normal limits, carotid upstroke brisk and symmetric, no bruits, no thyromegaly LYMPHATICS:  No cervical, inguinal adenopathy LUNGS:  Clear to auscultation bilaterally BACK:  No CVA tenderness CHEST:  Unremarkable HEART:  PMI not displaced or sustained,S1 and S2 within normal limits, no S3, no S4, no clicks, no rubs, *** murmurs ABD:  Flat, positive bowel sounds normal in frequency in pitch, no bruits, no rebound, no guarding, no midline pulsatile mass, no hepatomegaly, no splenomegaly EXT:  2 plus pulses throughout, no edema, no cyanosis no clubbing SKIN:  No rashes no  nodules NEURO:  Cranial nerves II through XII grossly intact, motor grossly intact throughout PSYCH:  Cognitively intact, oriented to person place and time    EKG:  EKG {ACTION; IS/IS YEM:[id number]} ordered today. The ekg ordered today demonstrates ***   Recent Labs: 12/05/2015: TSH 0.753 09/02/2016: ALT 23; BUN 18; Creatinine, Ser 0.81; Potassium 5.3; Sodium 141    Lipid Panel    Component Value Date/Time   CHOL 247 (H) 09/02/2016 1451   CHOL 201 (H) 05/11/2013 1735   TRIG 315 (H) 09/02/2016 1451   TRIG 349 (H) 12/20/2014 1500   TRIG 324 (H) 05/11/2013 1735   HDL 42 09/02/2016 1451   HDL 36 (L) 12/20/2014 1500   HDL 41 05/11/2013 1735   CHOLHDL 5.9 (H) 09/02/2016 1451   LDLCALC 142 (H) 09/02/2016 1451   LDLCALC 56 05/21/2014 1554   LDLCALC 95 05/11/2013 1735      Wt Readings from Last 3 Encounters:  09/02/16 165 lb (74.8 kg)  07/21/16 164 lb 3.2 oz (74.5 kg)  07/07/16 162 lb (73.5 kg)      Other studies Reviewed: Additional studies/ records that were reviewed today include: ***. Review of the above records demonstrates:  Please see elsewhere in the note.  ***   ASSESSMENT AND PLAN:  *** CAROTID STENOSIS:  ***  DM: ***  HTN:  ***  HYPERLIPIDEMIA:  ***  Current medicines are reviewed at length with the patient today.  The patient {ACTIONS; HAS/DOES NOT HAVE:19233} concerns regarding medicines.  The following changes have been made:  {PLAN; NO CHANGE:13088:s}  Labs/ tests ordered today include: *** No orders of the defined types were placed in this encounter.    Disposition:   FU with ***    Signed, Minus Breeding, MD  10/20/2016 3:59 PM    [facility]

## 2016-10-21 ENCOUNTER — Ambulatory Visit: Payer: Self-pay | Admitting: Cardiology

## 2016-11-12 ENCOUNTER — Other Ambulatory Visit: Payer: Self-pay | Admitting: Nurse Practitioner

## 2016-11-12 MED ORDER — INSULIN GLARGINE-LIXISENATIDE 100-33 UNT-MCG/ML ~~LOC~~ SOPN: 30.0000 [IU] | PEN_INJECTOR | Freq: Every day | SUBCUTANEOUS | 0 refills | Status: DC

## 2016-11-12 NOTE — Telephone Encounter (Signed)
Refill sent to pharmacy.   

## 2016-11-13 ENCOUNTER — Telehealth: Payer: Self-pay | Admitting: Pharmacist

## 2016-11-13 MED ORDER — INSULIN GLARGINE-LIXISENATIDE 100-33 UNT-MCG/ML ~~LOC~~ SOPN: 30.0000 [IU] | PEN_INJECTOR | Freq: Every day | SUBCUTANEOUS | 1 refills | Status: DC

## 2016-11-13 NOTE — Telephone Encounter (Signed)
[facility]  Mrs. Carddock's husband called to see if Rx for Lawana ChambersSoliqua was ready for pick up and they had not received Rx.  It appeared that Rx had been refilled on our end but on closer look the Rx from 11/12/16 status was "no print" and had not been sent.  I sent in Rx today.

## 2016-11-15 ENCOUNTER — Telehealth: Payer: Self-pay

## 2016-11-15 NOTE — Telephone Encounter (Signed)
Patients husband came in to office asking about Soliqua rx. States that he thought she could get it free for one year. She is not completely out but will be soon. Please advise

## 2016-11-16 MED ORDER — INSULIN GLARGINE-LIXISENATIDE 100-33 UNT-MCG/ML ~~LOC~~ SOPN: 30.0000 [IU] | PEN_INJECTOR | Freq: Every day | SUBCUTANEOUS | 0 refills | Status: DC

## 2016-11-16 NOTE — Telephone Encounter (Signed)
Called [employer] patient assistance program.  Medicare patients are only approved through 10/31/2016 until their Medicare benefits restart.  Patient will have to get NigerSoliqua with her insurance.  Once she is in Medicare coverage gap / "doughnut hole" we can re[facility]

## 2016-12-07 ENCOUNTER — Ambulatory Visit (INDEPENDENT_AMBULATORY_CARE_PROVIDER_SITE_OTHER): Payer: Medicare Other | Admitting: Nurse Practitioner

## 2016-12-07 ENCOUNTER — Encounter: Payer: Self-pay | Admitting: Nurse Practitioner

## 2016-12-07 VITALS — BP 150/86 | HR 90 | Temp 98.7°F | Ht 61.0 in | Wt 164.6 lb

## 2016-12-07 DIAGNOSIS — R609 Edema, unspecified: Secondary | ICD-10-CM | POA: Diagnosis not present

## 2016-12-07 DIAGNOSIS — E1140 Type 2 diabetes mellitus with diabetic neuropathy, unspecified: Secondary | ICD-10-CM | POA: Diagnosis not present

## 2016-12-07 DIAGNOSIS — I10 Essential (primary) hypertension: Secondary | ICD-10-CM | POA: Diagnosis not present

## 2016-12-07 DIAGNOSIS — R3915 Urgency of urination: Secondary | ICD-10-CM | POA: Diagnosis not present

## 2016-12-07 DIAGNOSIS — I700 Atherosclerosis of aorta: Secondary | ICD-10-CM | POA: Diagnosis not present

## 2016-12-07 DIAGNOSIS — F411 Generalized anxiety disorder: Secondary | ICD-10-CM

## 2016-12-07 DIAGNOSIS — Z6831 Body mass index (BMI) 31.0-31.9, adult: Secondary | ICD-10-CM | POA: Diagnosis not present

## 2016-12-07 DIAGNOSIS — Z1211 Encounter for screening for malignant neoplasm of colon: Secondary | ICD-10-CM

## 2016-12-07 DIAGNOSIS — E785 Hyperlipidemia, unspecified: Secondary | ICD-10-CM

## 2016-12-07 LAB — BAYER DCA HB A1C WAIVED: HB A1C (BAYER DCA - WAIVED): 9.6 % — ABNORMAL HIGH (ref <7.0)

## 2016-12-07 MED ORDER — FUROSEMIDE 40 MG PO TABS: 40.0000 mg | ORAL_TABLET | Freq: Every day | ORAL | 1 refills | Status: DC

## 2016-12-07 MED ORDER — LISINOPRIL 20 MG PO TABS: 20.0000 mg | ORAL_TABLET | Freq: Every day | ORAL | 1 refills | Status: DC

## 2016-12-07 MED ORDER — METFORMIN HCL ER 750 MG PO TB24: ORAL_TABLET | ORAL | 1 refills | Status: DC

## 2016-12-07 MED ORDER — MIRABEGRON ER 25 MG PO TB24: 25.0000 mg | ORAL_TABLET | Freq: Every day | ORAL | 5 refills | Status: DC

## 2016-12-07 MED ORDER — SIMVASTATIN 80 MG PO TABS: ORAL_TABLET | ORAL | 1 refills | Status: DC

## 2016-12-07 MED ORDER — ALPRAZOLAM 1 MG PO TABS: 1.0000 mg | ORAL_TABLET | Freq: Two times a day (BID) | ORAL | 1 refills | Status: DC | PRN

## 2016-12-07 MED ORDER — GABAPENTIN 300 MG PO CAPS: 300.0000 mg | ORAL_CAPSULE | Freq: Every day | ORAL | 1 refills | Status: DC

## 2016-12-07 MED ORDER — FENOFIBRATE 160 MG PO TABS: 160.0000 mg | ORAL_TABLET | Freq: Every day | ORAL | 1 refills | Status: DC

## 2016-12-07 MED ORDER — INSULIN GLARGINE-LIXISENATIDE 100-33 UNT-MCG/ML ~~LOC~~ SOPN: 30.0000 [IU] | PEN_INJECTOR | Freq: Every day | SUBCUTANEOUS | 5 refills | Status: DC

## 2016-12-07 NOTE — Progress Notes (Signed)
Subjective:    Patient ID: Kylie Ward, female    DOB: May 15, 1946, 71 y.o.   MRN: [id number]    Patient here today for follow up of chronic medical problems.   C/O low back pain that comes and goes- pain increases with walking- sitting and OTC meds will help most of the time- occurs 2-3 days out of the week. Pain radiates into left hip and leg.  Current Outpatient Prescriptions on File Prior to Visit  Medication Sig Dispense Refill  . ACCU-CHEK FASTCLIX LANCETS MISC Test tid, DX E11.9 102 each 2  . ALPRAZolam (XANAX) 1 MG tablet Take 1 tablet (1 mg total) by mouth 2 (two) times daily as needed for anxiety. 60 tablet 1  . aspirin 81 MG tablet Take 81 mg by mouth daily.    . blood glucose meter kit and supplies 1 each by Other route 4 (four) times daily -  with meals and at bedtime. 1 each 0  . Elastic Bandages & Supports (V-2 HIGH COMPRESSION HOSE) MISC 1 each by Does not apply route daily. 1 each 0  . fenofibrate 160 MG tablet Take 1 tablet (160 mg total) by mouth daily. 90 tablet 1  . furosemide (LASIX) 40 MG tablet Take 1 tablet (40 mg total) by mouth daily. 90 tablet 1  . gabapentin (NEURONTIN) 300 MG capsule Take 1 capsule (300 mg total) by mouth at bedtime. 90 capsule 1  . glucose blood (ACCU-CHEK SMARTVIEW) test strip Check blood sugar tid. DX E11.42 100 each 4  . Insulin Glargine-Lixisenatide (SOLIQUA) 100-33 UNT-MCG/ML SOPN Inject 30-60 Units into the skin daily. 18 mL 0  . insulin lispro (HUMALOG) 100 UNIT/ML injection Sliding scale up to 8u TID 10 mL 11  . Insulin Pen Needle 31G X 8 MM MISC Use to inject Lantus insulin once a day 100 each 1  . lisinopril (PRINIVIL,ZESTRIL) 20 MG tablet Take 1 tablet (20 mg total) by mouth daily. 90 tablet 1  . metFORMIN (GLUCOPHAGE-XR) 750 MG 24 hr tablet Take 1 Tablet by mouth once daily with breakfast 90 tablet 0  . mirabegron ER (MYRBETRIQ) 25 MG TB24 tablet Take 1 tablet (25 mg total) by mouth daily. 30 tablet 5  . simvastatin (ZOCOR) 80  MG tablet TAKE 1 TABLET (80 MG TOTAL) BY MOUTH DAILY. 90 tablet 1   No current facility-administered medications on file prior to visit.    Diabetes  She presents for her follow-up diabetic visit. She has type 2 diabetes mellitus. Pertinent negatives for hypoglycemia include no confusion or dizziness. Pertinent negatives for diabetes include no chest pain, no polydipsia, no polyphagia and no visual change ( needs new glasses). Diabetic complications include nephropathy (feet). Risk factors for coronary artery disease include dyslipidemia, hypertension and post-menopausal. Current diabetic treatment includes insulin injections and oral agent (monotherapy). She is compliant with treatment most of the time. Her weight is stable. She has not had a previous visit with a dietitian. She rarely participates in exercise. Her breakfast blood glucose is taken between 9-10 am. Her breakfast blood glucose range is generally 90-110 mg/dl. Her lunch blood glucose is taken between 2-3 pm. Her lunch blood glucose range is generally 140-180 mg/dl. Her dinner blood glucose is taken between 7-8 pm. Her dinner blood glucose range is generally 140-180 mg/dl. Her highest blood glucose is >200 mg/dl. Her overall blood glucose range is 140-180 mg/dl. (Blood sugars still running high- does not watch diet.) An ACE inhibitor/angiotensin II receptor blocker is being taken. She does  not see a podiatrist.Eye exam is not current.  Hyperlipidemia  This is a chronic problem. The current episode started more than 1 year ago. The problem is uncontrolled. Recent lipid tests were reviewed and are high. Exacerbating diseases include diabetes and obesity. Pertinent negatives include no chest pain, myalgias or shortness of breath. Current antihyperlipidemic treatment includes statins. The current treatment provides moderate improvement of lipids. Compliance problems include adherence to diet and adherence to exercise.  Risk factors for coronary  artery disease include diabetes mellitus, hypertension, obesity and post-menopausal.  Hypertension  This is a chronic problem. The current episode started more than 1 year ago. The problem is controlled. Pertinent negatives include no chest pain or shortness of breath. Risk factors for coronary artery disease include diabetes mellitus, dyslipidemia, obesity and post-menopausal state. Past treatments include diuretics and ACE inhibitors. The current treatment provides moderate improvement. Compliance problems include diet and exercise.   GAD Xanax .5 as needed- keeps her calm Neuropathy feet Patient currently taking neurotin daily which helps some with burning sensation. Urinary urgency myrbetriq daily helps a lot with urgency feelings     Review of Systems  Constitutional: Negative.   HENT: Negative.   Eyes: Negative.   Respiratory: Negative.  Negative for shortness of breath.   Cardiovascular: Negative.  Negative for chest pain.  Gastrointestinal: Negative.   Endocrine: Negative.  Negative for polydipsia and polyphagia.  Genitourinary: Negative.   Musculoskeletal: Negative.  Negative for myalgias.  Skin: Negative.   Neurological: Positive for numbness. Negative for dizziness.  Hematological: Negative.   Psychiatric/Behavioral: Negative for confusion.     Objective:   Physical Exam  Constitutional: She is oriented to person, place, and time. Vital signs are normal. She appears well-developed and well-nourished.  HENT:  Right Ear: Hearing, tympanic membrane, external ear and ear canal normal.  Left Ear: Hearing, tympanic membrane, external ear and ear canal normal.  Nose: Nose normal.  Mouth/Throat: Uvula is midline and oropharynx is clear and moist.  Eyes: Conjunctivae and EOM are normal.  Neck: Trachea normal, normal range of motion and full passive range of motion without pain. Neck supple. No JVD present. Carotid bruit is not present. No thyromegaly present.   Cardiovascular: Normal rate, regular rhythm, normal heart sounds and intact distal pulses.  Exam reveals no gallop and no friction rub.   No murmur heard. Pulmonary/Chest: Effort normal and breath sounds normal.  Abdominal: Soft. Normal appearance and bowel sounds are normal. She exhibits no distension and no mass. There is no tenderness.  Musculoskeletal: Normal range of motion. She exhibits edema.  1+ edema in right foot  Lymphadenopathy:    She has no cervical adenopathy.  Neurological: She is alert and oriented to person, place, and time. She has normal reflexes.  Skin: Skin is warm and dry.  Psychiatric: She has a normal mood and affect. Her behavior is normal. Judgment and thought content normal.  Vitals reviewed.   LMP 01/27/1983   BP (!) 150/86 (BP Location: Left Arm, Cuff Size: Normal)   Pulse 90   Temp 98.7 F (37.1 C) (Oral)   Ht _0  (1.549 m)   Wt 164 lb 9.6 oz (74.7 kg)   LMP 01/27/1983   BMI 31.10 kg/m     hgba1c 8.6 down from 9.3 at last visit.     Assessment & Plan:  1. Essential hypertension, benign Low sodium iet - CMP14+EGFR - lisinopril (PRINIVIL,ZESTRIL) 20 MG tablet; Take 1 tablet (20 mg total) by mouth daily.  Dispense:  90 tablet; Refill: 1  2. Hyperlipidemia with target LDL less than 100 Low fat diet - CMP14+EGFR - Lipid panel - fenofibrate 160 MG tablet; Take 1 tablet (160 mg total) by mouth daily.  Dispense: 90 tablet; Refill: 1 - simvastatin (ZOCOR) 80 MG tablet; TAKE 1 TABLET (80 MG TOTAL) BY MOUTH DAILY.  Dispense: 90 tablet; Refill: 1  3. Type 2 diabetes mellitus with diabetic neuropathy, without long-term current use of insulin (HCC) Carb counting Follow up with clinical pharmaciist - CMP14+EGFR - Bayer DCA Hb A1c Waived - gabapentin (NEURONTIN) 300 MG capsule; Take 1 capsule (300 mg total) by mouth at bedtime.  Dispense: 90 capsule; Refill: 1 - metFORMIN (GLUCOPHAGE-XR) 750 MG 24 hr tablet; Take 1 Tablet by mouth once daily with  breakfast  Dispense: 90 tablet; Refill: 1 - Insulin Glargine-Lixisenatide (SOLIQUA) 100-33 UNT-MCG/ML SOPN; Inject 30-60 Units into the skin daily.  Dispense: 18 mL; Refill: 5  4. Atherosclerosis of aorta ([place])  5. BMI 31.0-31.9,adult Discussed diet and exercise for person with BMI >25 Will recheck weight in 3-6 months  6. GAD (generalized anxiety disorder) Stress management - ALPRAZolam (XANAX) 1 MG tablet; Take 1 tablet (1 mg total) by mouth 2 (two) times daily as needed for anxiety.  Dispense: 60 tablet; Refill: 1  7. Peripheral edema Elevate legs when sitting - furosemide (LASIX) 40 MG tablet; Take 1 tablet (40 mg total) by mouth daily.  Dispense: 90 tablet; Refill: 1  8. Urinary urgency - mirabegron ER (MYRBETRIQ) 25 MG TB24 tablet; Take 1 tablet (25 mg total) by mouth daily.  Dispense: 30 tablet; Refill: 5  9. Colon cancer screening - Fecal occult blood, imunochemical; Future    Labs pending Health maintenance reviewed Diet and exercise encouraged Continue all meds Follow up  In 3 months   [place], FNP

## 2016-12-07 NOTE — Patient Instructions (Signed)
Carbohydrate Counting for Diabetes Mellitus, Adult Carbohydrate counting is a method for keeping track of how many carbohydrates you eat. Eating carbohydrates naturally increases the amount of sugar (glucose) in the blood. Counting how many carbohydrates you eat helps keep your blood glucose within normal limits, which helps you manage your diabetes (diabetes mellitus). It is important to know how many carbohydrates you can safely have in each meal. This is different for every person. A diet and nutrition specialist (registered dietitian) can help you make a meal plan and calculate how many carbohydrates you should have at each meal and snack. Carbohydrates are found in the following foods:  Grains, such as breads and cereals.  Dried beans and soy products.  Starchy vegetables, such as potatoes, peas, and corn.  Fruit and fruit juices.  Milk and yogurt.  Sweets and snack foods, such as cake, cookies, candy, chips, and soft drinks. How do I count carbohydrates? There are two ways to count carbohydrates in food. You can use either of the methods or a combination of both. Reading "Nutrition Facts" on packaged food  The "Nutrition Facts" list is included on the labels of almost all packaged foods and beverages in the [place] It includes:  The serving size.  Information about nutrients in each serving, including the grams (g) of carbohydrate per serving. To use the "Nutrition Facts":  Decide how many servings you will have.  Multiply the number of servings by the number of carbohydrates per serving.  The resulting number is the total amount of carbohydrates that you will be having. Learning standard serving sizes of other foods  When you eat foods containing carbohydrates that are not packaged or do not include "Nutrition Facts" on the label, you need to measure the servings in order to count the amount of carbohydrates:  Measure the foods that you will eat with a food scale or measuring  cup, if needed.  Decide how many standard-size servings you will eat.  Multiply the number of servings by 15. Most carbohydrate-rich foods have about 15 g of carbohydrates per serving.  For example, if you eat 8 oz (170 g) of strawberries, you will have eaten 2 servings and 30 g of carbohydrates (2 servings x 15 g = 30 g).  For foods that have more than one food mixed, such as soups and casseroles, you must count the carbohydrates in each food that is included. The following list contains standard serving sizes of common carbohydrate-rich foods. Each of these servings has about 15 g of carbohydrates:   hamburger bun or  English muffin.   oz (15 mL) syrup.   oz (14 g) jelly.  1 slice of bread.  1 six-inch tortilla.  3 oz (85 g) cooked rice or pasta.  4 oz (113 g) cooked dried beans.  4 oz (113 g) starchy vegetable, such as peas, corn, or potatoes.  4 oz (113 g) hot cereal.  4 oz (113 g) mashed potatoes or  of a large baked potato.  4 oz (113 g) canned or frozen fruit.  4 oz (120 mL) fruit juice.  4-6 crackers.  6 chicken nuggets.  6 oz (170 g) unsweetened dry cereal.  6 oz (170 g) plain fat-free yogurt or yogurt sweetened with artificial sweeteners.  8 oz (240 mL) milk.  8 oz (170 g) fresh fruit or one small piece of fruit.  24 oz (680 g) popped popcorn. Example of carbohydrate counting Sample meal  3 oz (85 g) chicken breast.  6 oz (  170 g) brown rice.  4 oz (113 g) corn.  8 oz (240 mL) milk.  8 oz (170 g) strawberries with sugar-free whipped topping. Carbohydrate calculation 1. Identify the foods that contain carbohydrates:  Rice.  Corn.  Milk.  Strawberries. 2. Calculate how many servings you have of each food:  2 servings rice.  1 serving corn.  1 serving milk.  1 serving strawberries. 3. Multiply each number of servings by 15 g:  2 servings rice x 15 g = 30 g.  1 serving corn x 15 g = 15 g.  1 serving milk x 15 g = 15  g.  1 serving strawberries x 15 g = 15 g. 4. Add together all of the amounts to find the total grams of carbohydrates eaten:  30 g + 15 g + 15 g + 15 g = 75 g of carbohydrates total. This information is not intended to replace advice given to you by your health care provider. Make sure you discuss any questions you have with your health care provider. Document Released: 10/18/2005 Document Revised: 05/07/2016 Document Reviewed: 03/31/2016 Elsevier Interactive Patient Education  2017 Elsevier Inc.  

## 2016-12-08 LAB — CMP14+EGFR
A/G RATIO: 2.1 (ref 1.2–2.2)
ALBUMIN: 4.5 g/dL (ref 3.6–4.8)
ALT: 27 IU/L (ref 0–32)
AST: 18 IU/L (ref 0–40)
Alkaline Phosphatase: 96 IU/L (ref 39–117)
BILIRUBIN TOTAL: 0.3 mg/dL (ref 0.0–1.2)
BUN / CREAT RATIO: 21 (ref 12–28)
BUN: 16 mg/dL (ref 8–27)
CO2: 22 mmol/L (ref 18–29)
Calcium: 9.2 mg/dL (ref 8.7–10.3)
Chloride: 102 mmol/L (ref 96–106)
Creatinine, Ser: 0.77 mg/dL (ref 0.57–1.00)
GFR calc non Af Amer: 80 mL/min/{1.73_m2} (ref >59)
GFR, EST AFRICAN AMERICAN: 92 mL/min/{1.73_m2} (ref >59)
GLOBULIN, TOTAL: 2.1 g/dL (ref 1.5–4.5)
Glucose: 113 mg/dL — ABNORMAL HIGH (ref 65–99)
POTASSIUM: 4.6 mmol/L (ref 3.5–5.2)
SODIUM: 143 mmol/L (ref 134–144)
TOTAL PROTEIN: 6.6 g/dL (ref 6.0–8.5)

## 2016-12-08 LAB — LIPID PANEL
CHOL/HDL RATIO: 6.2 ratio — AB (ref 0.0–4.4)
Cholesterol, Total: 241 mg/dL — ABNORMAL HIGH (ref 100–199)
HDL: 39 mg/dL — AB (ref >39)
LDL Calculated: 125 mg/dL — ABNORMAL HIGH (ref 0–99)
Triglycerides: 385 mg/dL — ABNORMAL HIGH (ref 0–149)
VLDL Cholesterol Cal: 77 mg/dL — ABNORMAL HIGH (ref 5–40)

## 2016-12-09 ENCOUNTER — Other Ambulatory Visit: Payer: Self-pay | Admitting: Nurse Practitioner

## 2016-12-14 ENCOUNTER — Ambulatory Visit: Payer: Medicare Other | Admitting: Pharmacist

## 2016-12-29 ENCOUNTER — Ambulatory Visit (INDEPENDENT_AMBULATORY_CARE_PROVIDER_SITE_OTHER): Payer: Medicare Other | Admitting: Pharmacist

## 2016-12-29 ENCOUNTER — Encounter: Payer: Self-pay | Admitting: Pharmacist

## 2016-12-29 VITALS — BP 136/72 | HR 70 | Ht 61.0 in | Wt 164.0 lb

## 2016-12-29 DIAGNOSIS — Z0000 Encounter for general adult medical examination without abnormal findings: Secondary | ICD-10-CM | POA: Diagnosis not present

## 2016-12-29 MED ORDER — PAROXETINE HCL 20 MG PO TABS: 20.0000 mg | ORAL_TABLET | Freq: Every day | ORAL | 1 refills | Status: DC

## 2016-12-29 NOTE — Progress Notes (Signed)
Patient ID: Kylie Ward, female   DOB: 18-Dec-1945, 71 y.o.   MRN: [id number]     Subjective:   Kylie Ward is a 71 y.o. female who presents for a subsequent Medicare Annual Wellness Visit.  Social History: Born/Raised: [employer] [place] Occupational history: [employer] Marital history: married for 8 years to husband Kylie Ward who is present with her today Has 1 daughter, 2 grandchildren and 1 great grandchild Alcohol/Tobacco/Substances: no   Review of Systems  Review of Systems  Constitutional: Negative.   HENT: Negative.   Eyes: Positive for blurred vision. Negative for double vision, photophobia, pain, discharge and redness.  Respiratory: Negative.   Cardiovascular: Negative.   Gastrointestinal: Negative.   Genitourinary: Negative.   Musculoskeletal: Positive for joint pain (hip pain).  Skin: Negative.   Neurological: Negative.   Endo/Heme/Allergies: Negative.   Psychiatric/Behavioral: Positive for depression. Negative for hallucinations, memory loss, substance abuse and suicidal ideas. The patient is nervous/anxious and has insomnia.    Hip Pain - patient saw Dr Nelva Bush for hip and back pain / possible sciatica in December 2017.  Patient reports he recommended physical therapy 4 hours a day and 4 days a week.  She did not feel that she could do this and never made appt with physical therapy.  She continues to take OTC BackAid Relief or tylenol for pain with some relief but patient reports she is unable to exercise or engage in household activities due to pain  DM - when I last saw patient she was started on Soliqua.  Initially A1c decreased from 9.3 to 8.6 however when checked 12/07/2016 A1c was back up at 9.6% Patient reports variable BG - ranges from 80 to 300.  BG in office today was 104. She recently increase Soliqua from 50 to 55 units about 2 days ago. [name] also asks about Kylie Ward personal CGM system and if this might help her.     Depression - Depression screening score was elevated.  Patient has tired several SSRIs in past - sertraline, escitalopram and citalopram.  Per patient they all made her nauseated.  Her husband has taken paroxetine with success and patient is willing to try.  She take alprazolam 0.19m for anxiety..   Current Medications (verified) Outpatient Encounter Prescriptions as of 12/29/2016  Medication Sig  . ACCU-CHEK FASTCLIX LANCETS MISC USE TO test sugar THREE times daily  . ALPRAZolam (XANAX) 1 MG tablet Take 1 tablet (1 mg total) by mouth 2 (two) times daily as needed for anxiety.  .Marland Kitchenaspirin 81 MG tablet Take 81 mg by mouth daily.  . blood glucose meter kit and supplies 1 each by Other route 4 (four) times daily -  with meals and at bedtime.  . fenofibrate 160 MG tablet Take 1 tablet (160 mg total) by mouth daily.  .Marland Kitchengabapentin (NEURONTIN) 300 MG capsule Take 1 capsule (300 mg total) by mouth at bedtime.  .Marland Kitchenglucose blood (ACCU-CHEK SMARTVIEW) test strip Check blood sugar tid. DX E11.42  . Insulin Glargine-Lixisenatide (SOLIQUA) 100-33 UNT-MCG/ML SOPN Inject 30-60 Units into the skin daily.  . insulin lispro (HUMALOG) 100 UNIT/ML injection Sliding scale up to 8u TID  . lisinopril (PRINIVIL,ZESTRIL) 20 MG tablet Take 1 tablet (20 mg total) by mouth daily.  . metFORMIN (GLUCOPHAGE-XR) 750 MG 24 hr tablet Take 1 Tablet by mouth once daily with breakfast  . mirabegron ER (MYRBETRIQ) 25 MG TB24 tablet Take 1 tablet (25 mg total) by mouth daily.  .Marland Kitchen  simvastatin (ZOCOR) 80 MG tablet TAKE 1 TABLET (80 MG TOTAL) BY MOUTH DAILY.  . SURE COMFORT PEN NEEDLES 31G X 8 MM MISC USE TO inject lantus ONCE daily  . Elastic Bandages & Supports (V-2 HIGH COMPRESSION HOSE) MISC 1 each by Does not apply route daily. (Patient not taking: Reported on 12/29/2016)  . furosemide (LASIX) 40 MG tablet Take 1 tablet (40 mg total) by mouth daily. (Patient not taking: Reported on 12/29/2016)   No facility-administered encounter  medications on file as of 12/29/2016.     Allergies (verified) Lipitor [atorvastatin] and Niaspan [niacin er]   History: Past Medical History:  Diagnosis Date  . Anxiety   . Diabetes mellitus without complication ([place])   . Hyperlipidemia   . Hypertension   . Stroke [facility])    Past Surgical History:  Procedure Laterality Date  . CAROTID ENDARTERECTOMY Right   . CAROTID STENT     Family History  Problem Relation Age of Onset  . Heart disease Father   . Heart attack Father   . Hypertension Father   . Diabetes Sister   . Stroke Sister   . Diabetes Brother   . Diabetes Sister   . Diabetes Sister   . Diabetes Brother   . Diabetes Brother    Social History   Occupational History  . Not on file.   Social History Main Topics  . Smoking status: Former Smoker    Quit date: 12/02/2009  . Smokeless tobacco: Former Systems developer    Quit date: 01/27/2011  . Alcohol use No  . Drug use: No  . Sexual activity: Yes    Do you feel safe at home?  Yes Are there smokers in your home (other than you)? No  Dietary issues and exercise activities: Current Exercise Habits: The patient does not participate in regular exercise at present, Exercise limited by: orthopedic condition(s)  Current Dietary habits:  Cereal with whole milk for breakfast; lunch usually nothing or a small snack.  Supper - meat and vegetables Tries to limit salt, sugar / sweets and potatoes.   Objective:    Today's Vitals   12/29/16 1525  BP: 140/72  Pulse: 70  Weight: 164 lb (74.4 kg)  Height: '5\' 1"'  (1.549 m)  PainSc: 4   PainLoc: Hip   Body mass index is 30.99 kg/m.  RBG in office was 104 today  Activities of Daily Living In your present state of health, do you have any difficulty performing the following activities: 12/29/2016  Hearing? N  Vision? N  Difficulty concentrating or making decisions? N  Walking or climbing stairs? Y  Dressing or bathing? N  Doing errands, shopping? Y  Preparing Food and eating  ? N  Using the Toilet? N  In the past six months, have you accidently leaked urine? Y  Do you have problems with loss of bowel control? N  Managing your Medications? N  Managing your Finances? N  Housekeeping or managing your Housekeeping? Y  Some recent data might be hidden     Cardiac Risk Factors include: advanced age (>67mn, >>50women);diabetes mellitus;dyslipidemia;obesity (BMI >30kg/m2);sedentary lifestyle  Depression Screen PHQ 2/9 Scores 12/29/2016 12/07/2016 09/02/2016 07/21/2016  PHQ - 2 Score 4 4 0 0  PHQ- 9 Score 14 16 - -     Fall Risk Fall Risk  12/29/2016 12/07/2016 09/02/2016 07/21/2016 06/01/2016  Falls in the past year? No No No No No    Cognitive Function: MMSE - Mini Mental State Exam 12/29/2016 12/05/2015  Orientation to time 5 5  Orientation to Place 5 5  Registration 3 3  Attention/ Calculation 5 5  Recall 0 1  Language- name 2 objects 2 2  Language- repeat 1 1  Language- follow 3 step command 3 3  Language- read & follow direction 1 1  Write a sentence 1 1  Copy design 1 1  Total score 27 28    Immunizations and Health Maintenance Immunization History  Administered Date(s) Administered  . Influenza,inj,Quad PF,36+ Mos 10/19/2013, 09/06/2014, 08/14/2015, 09/02/2016  . Pneumococcal Conjugate-13 08/14/2015   Health Maintenance Due  Topic Date Due  . OPHTHALMOLOGY EXAM  12/17/2016    Patient Care Team: Chevis Pretty, FNP as PCP - General (Nurse Practitioner)  Indicate any recent Medical Services you may have received from other than [facility] providers in the past year (date may be approximate).    Assessment:    Annual Wellness Visit  Depression Diabetes, insulin dep; uncontrolled Hip / back pain   Screening Tests Health Maintenance  Topic Date Due  . OPHTHALMOLOGY EXAM  12/17/2016  . MAMMOGRAM  06/01/2017 (Originally 04/18/1966)  . COLONOSCOPY  06/01/2017 (Originally 04/18/1998)  . HEMOGLOBIN A1C  06/06/2017  . DEXA SCAN  08/11/2017  .  FOOT EXAM  12/07/2017  . TETANUS/TDAP  08/01/2021  . INFLUENZA VACCINE  Completed  . Hepatitis C Screening  Completed  . PNA vac Low Risk Adult  Completed        Plan:   During the course of the visit Jezlyn was educated and counseled about the following appropriate screening and preventive services:   Vaccines to include Pneumoccal, Influenza, Td, Zostavax - vaccines are UTD except Tdap which patient declined today  Colorectal cancer screening - reminded to bring in FOBT that was given by her PCP  Cardiovascular disease screening - needs EKG  Lipids were elevated - discussed limiting high fat foods, continue with simvastatin 39m qd  Diabetes - increse to checking BG 3-4 times a day.  Discussed posibbly getting LElenor Legatopersonal CGM for patient.  Will need more BG and insulin records.   Continue with Soliqua 55 units daily, Humalog prn elevated BG, metformin [id number]  Bone Denisty / Osteoporosis Screening- UTD  Mammogram - appt made in office today for 03/28/17  Glaucoma screening / Diabetic Eye Exam - reminded that eye exam is due - number for Dr NoDanella Maierss given  Nutrition counseling - dicussed limiting fat in diet and reviewed CHO counting / serving sizes  Advanced Directives - information provided  Physical Activity - enocuraged pt to reconsider physical therapy for hip / back as recommended by Dr RaNelva Bush I will request office notes to review what his plan and diagnosis.  Discussed depression with patient's PCP - recommended start paroxetine 2027md.    Goals    None       Patient Instructions (the written plan) were given to the patient.   TamCherre RobinsharmD   12/29/2016

## 2016-12-29 NOTE — Patient Instructions (Addendum)
  Ms. Kylie Ward , Thank you for taking time to come for your Medicare Wellness Visit. I appreciate your ongoing commitment to your health goals. Please review the following plan we discussed and let me know if I can assist you in the future.   These are the goals we discussed:  Check blood glucose 3 to 4 times a day and keep a record of readings and when you give insulin injections.   Increase non-starchy vegetables - carrots, green bean, squash, zucchini, tomatoes, onions, peppers, spinach and other green leafy vegetables, cabbage, lettuce, cucumbers, asparagus, okra (not fried), eggplant Limit sugar and processed foods (cakes, cookies, ice cream, crackers and chips) Increase fresh fruit but limit serving sizes 1/2 cup or about the size of tennis or baseball Limit red meat to no more than 1-2 times per week (serving size about the size of your palm) Choose whole grains / lean proteins - whole wheat bread, quinoa, whole grain rice (1/2 cup), fish, chicken, [place] Avoid sugar and calorie containing beverages - soda, sweet tea and juice.  Choose water or unsweetened tea instead.  Start paxil or paroxitine 20mg  once a day for mood  We are requesting records from Dr Ethelene Halamos.  Consider recommendation for physical therapy   This is a list of the screening recommended for you and due dates:  Health Maintenance  Topic Date Due  . Eye exam for diabetics  12/17/2016 - due now  Dr Francesca OmanNobles [phone]  . Mammogram  06/01/2017 - due now Appointment made for 03/20/2017 at 3PM [facility]  . Colon Cancer Screening  06/01/2017 - due now remember to bring back test  . Hemoglobin A1C  06/06/2017  . Complete foot exam   12/07/2017  . Tetanus Vaccine  08/01/2021  . Flu Shot  Completed  . DEXA scan (bone density measurement)  Completed  .  Hepatitis C: One time screening is recommended by Center for Disease Control  (CDC) for  adults born from [birth year] through  1963.   Completed  . Pneumonia vaccines  Completed  *Topic was postponed. The date shown is not the original due date.

## 2017-01-26 ENCOUNTER — Other Ambulatory Visit: Payer: Self-pay | Admitting: Nurse Practitioner

## 2017-01-27 ENCOUNTER — Ambulatory Visit: Payer: Self-pay | Admitting: Pharmacist

## 2017-02-14 ENCOUNTER — Ambulatory Visit: Payer: Self-pay | Admitting: Pharmacist

## 2017-02-14 ENCOUNTER — Other Ambulatory Visit: Payer: Self-pay | Admitting: Nurse Practitioner

## 2017-02-15 ENCOUNTER — Encounter: Payer: Self-pay | Admitting: Nurse Practitioner

## 2017-03-03 ENCOUNTER — Ambulatory Visit: Payer: Medicare Other | Admitting: Pharmacist

## 2017-03-08 ENCOUNTER — Encounter: Payer: Self-pay | Admitting: Nurse Practitioner

## 2017-03-08 ENCOUNTER — Ambulatory Visit (INDEPENDENT_AMBULATORY_CARE_PROVIDER_SITE_OTHER): Payer: Medicare Other | Admitting: Nurse Practitioner

## 2017-03-08 VITALS — BP 154/77 | HR 89 | Temp 98.1°F | Ht 61.0 in | Wt 167.0 lb

## 2017-03-08 DIAGNOSIS — I10 Essential (primary) hypertension: Secondary | ICD-10-CM

## 2017-03-08 DIAGNOSIS — E785 Hyperlipidemia, unspecified: Secondary | ICD-10-CM

## 2017-03-08 DIAGNOSIS — R609 Edema, unspecified: Secondary | ICD-10-CM

## 2017-03-08 DIAGNOSIS — F411 Generalized anxiety disorder: Secondary | ICD-10-CM

## 2017-03-08 DIAGNOSIS — Z6831 Body mass index (BMI) 31.0-31.9, adult: Secondary | ICD-10-CM | POA: Diagnosis not present

## 2017-03-08 DIAGNOSIS — R350 Frequency of micturition: Secondary | ICD-10-CM | POA: Diagnosis not present

## 2017-03-08 DIAGNOSIS — E1140 Type 2 diabetes mellitus with diabetic neuropathy, unspecified: Secondary | ICD-10-CM | POA: Diagnosis not present

## 2017-03-08 LAB — BAYER DCA HB A1C WAIVED: HB A1C (BAYER DCA - WAIVED): 9.5 % — ABNORMAL HIGH (ref <7.0)

## 2017-03-08 MED ORDER — ALPRAZOLAM 1 MG PO TABS: 1.0000 mg | ORAL_TABLET | Freq: Two times a day (BID) | ORAL | 2 refills | Status: DC | PRN

## 2017-03-08 MED ORDER — INSULIN DEGLUDEC 200 UNIT/ML ~~LOC~~ SOPN: 55.0000 [IU] | PEN_INJECTOR | Freq: Every day | SUBCUTANEOUS | 5 refills | Status: DC

## 2017-03-08 NOTE — Progress Notes (Signed)
Subjective:    Patient ID: Kylie Ward, female    DOB: 06/14/1946, 72 y.o.   MRN: [id number]  HPI Kylie Ward is here today for follow up of chronic medical problem.  Outpatient Encounter Prescriptions as of 03/08/2017  Medication Sig  . ACCU-CHEK FASTCLIX LANCETS MISC USE TO test sugar THREE times daily  . ACCU-CHEK SMARTVIEW test strip USE TO check sugar THREE times daily  . ALPRAZolam (XANAX) 1 MG tablet Take 1 tablet (1 mg total) by mouth 2 (two) times daily as needed for anxiety.  Marland Kitchen aspirin 81 MG tablet Take 81 mg by mouth daily.  . blood glucose meter kit and supplies 1 each by Other route 4 (four) times daily -  with meals and at bedtime.  . Elastic Bandages & Supports (V-2 HIGH COMPRESSION HOSE) MISC 1 each by Does not apply route daily. (Patient not taking: Reported on 12/29/2016)  . fenofibrate 160 MG tablet Take 1 tablet (160 mg total) by mouth daily.  . furosemide (LASIX) 40 MG tablet Take 1 tablet (40 mg total) by mouth daily. (Patient not taking: Reported on 12/29/2016)  . gabapentin (NEURONTIN) 300 MG capsule Take 1 capsule (300 mg total) by mouth at bedtime.  . Insulin Glargine-Lixisenatide (SOLIQUA) 100-33 UNT-MCG/ML SOPN Inject 30-60 Units into the skin daily.  . insulin lispro (HUMALOG) 100 UNIT/ML injection Sliding scale up to 8u TID  . lisinopril (PRINIVIL,ZESTRIL) 20 MG tablet Take 1 tablet (20 mg total) by mouth daily.  . metFORMIN (GLUCOPHAGE-XR) 750 MG 24 hr tablet Take 1 Tablet by mouth once daily with breakfast  . mirabegron ER (MYRBETRIQ) 25 MG TB24 tablet Take 1 tablet (25 mg total) by mouth daily.  Marland Kitchen PARoxetine (PAXIL) 20 MG tablet Take 1 Tablet by mouth once daily  . simvastatin (ZOCOR) 80 MG tablet TAKE 1 TABLET (80 MG TOTAL) BY MOUTH DAILY.  Haig Prophet COMFORT PEN NEEDLES 31G X 8 MM MISC USE TO inject lantus ONCE daily   No facility-administered encounter medications on file as of 03/08/2017.     1. Essential hypertension, benign  Patient taking  lisinopril and checking BP at home.  2. Type 2 diabetes mellitus with diabetic neuropathy, without long-term current use of insulin ([place])  Patient last HGBA1c was 9.6%- she has since been put on seloquia which she does not like ( says it makes her hungry). She does not do sliding scale 3xa day and usually eats a bedtime snack. Fasting blood sugars in morning is over 300 on a regular basis  3. Hyperlipidemia with target LDL less than 100  Managed with simvastatin.  4. BMI 31.0-31.9,adult  No recent weight gain or loss.  5. GAD (generalized anxiety disorder)  Managed with alprazolam as needed.  6. Peripheral edema  Patient wears compression hose as prescribed and taking lasix daily.    New complaints: None today.    Review of Systems  Constitutional: Negative for activity change, appetite change and fatigue.  HENT: Negative.   Eyes: Negative.   Respiratory: Negative for cough and shortness of breath.   Cardiovascular: Positive for leg swelling (chronic). Negative for chest pain.  Gastrointestinal: Negative for abdominal distention and abdominal pain.  Endocrine: Negative.   Genitourinary: Negative.   Musculoskeletal: Negative.   Skin: Negative.   Psychiatric/Behavioral: Negative.        Objective:   Physical Exam  Constitutional: She is oriented to person, place, and time. She appears well-developed and well-nourished.  HENT:  Head: Normocephalic.  Right Ear:  External ear normal.  Left Ear: External ear normal.  Mouth/Throat: Oropharynx is clear and moist.  Eyes: Pupils are equal, round, and reactive to light.  Neck: Normal range of motion. Neck supple.  Cardiovascular: Normal rate, regular rhythm and normal heart sounds.   No murmur heard. Pulmonary/Chest: Effort normal and breath sounds normal.  Abdominal: Soft. Bowel sounds are normal. There is no tenderness.  Musculoskeletal: She exhibits edema (bilateral lower extremities).  Neurological: She is alert and oriented  to person, place, and time.  Skin: Skin is warm and dry.  Psychiatric: She has a normal mood and affect. Her behavior is normal. Judgment and thought content normal.   BP (!) 154/77   Pulse 89   Temp 98.1 F (36.7 C) (Oral)   Ht _0  (1.549 m)   Wt 167 lb (75.8 kg)   LMP 01/27/1983   BMI 31.55 kg/m   HGBA!C- 9.5% today    Assessment & Plan:  1. Essential hypertension, benign Low sodium diet - CMP14+EGFR  2. Type 2 diabetes mellitus with diabetic neuropathy, without long-term current use of insulin (HCC) Stop soliquia Add tresiba daly novolog sliding scale 4x a day ( breakfast,lunch, dinner and bedtime snack) Keep diary of blood sugars - Bayer DCA Hb A1c Waived - Insulin Degludec (TRESIBA FLEXTOUCH) 200 UNIT/ML SOPN; Inject 56 Units into the skin daily.  Dispense: 3 pen; Refill: 5  3. Hyperlipidemia with target LDL less than 100 Low fat diet - Lipid panel  4. BMI 31.0-31.9,adult Discussed diet and exercise for person with BMI >25 Will recheck weight in 3-6 months  5. GAD (generalized anxiety disorder) Stress management - ALPRAZolam (XANAX) 1 MG tablet; Take 1 tablet (1 mg total) by mouth 2 (two) times daily as needed for anxiety.  Dispense: 60 tablet; Refill: 2  6. Peripheral edema Elevate legs when sitting WEAR COMPRESSION HOSE  7. Frequent urination - Urinalysis, Complete; Future

## 2017-03-08 NOTE — Patient Instructions (Signed)
Carbohydrate Counting for Diabetes Mellitus, Adult Carbohydrate counting is a method for keeping track of how many carbohydrates you eat. Eating carbohydrates naturally increases the amount of sugar (glucose) in the blood. Counting how many carbohydrates you eat helps keep your blood glucose within normal limits, which helps you manage your diabetes (diabetes mellitus). It is important to know how many carbohydrates you can safely have in each meal. This is different for every person. A diet and nutrition specialist (registered dietitian) can help you make a meal plan and calculate how many carbohydrates you should have at each meal and snack. Carbohydrates are found in the following foods:  Grains, such as breads and cereals.  Dried beans and soy products.  Starchy vegetables, such as potatoes, peas, and corn.  Fruit and fruit juices.  Milk and yogurt.  Sweets and snack foods, such as cake, cookies, candy, chips, and soft drinks. How do I count carbohydrates? There are two ways to count carbohydrates in food. You can use either of the methods or a combination of both. Reading "Nutrition Facts" on packaged food  The "Nutrition Facts" list is included on the labels of almost all packaged foods and beverages in the [place] It includes:  The serving size.  Information about nutrients in each serving, including the grams (g) of carbohydrate per serving. To use the "Nutrition Facts":  Decide how many servings you will have.  Multiply the number of servings by the number of carbohydrates per serving.  The resulting number is the total amount of carbohydrates that you will be having. Learning standard serving sizes of other foods  When you eat foods containing carbohydrates that are not packaged or do not include "Nutrition Facts" on the label, you need to measure the servings in order to count the amount of carbohydrates:  Measure the foods that you will eat with a food scale or measuring  cup, if needed.  Decide how many standard-size servings you will eat.  Multiply the number of servings by 15. Most carbohydrate-rich foods have about 15 g of carbohydrates per serving.  For example, if you eat 8 oz (170 g) of strawberries, you will have eaten 2 servings and 30 g of carbohydrates (2 servings x 15 g = 30 g).  For foods that have more than one food mixed, such as soups and casseroles, you must count the carbohydrates in each food that is included. The following list contains standard serving sizes of common carbohydrate-rich foods. Each of these servings has about 15 g of carbohydrates:   hamburger bun or  English muffin.   oz (15 mL) syrup.   oz (14 g) jelly.  1 slice of bread.  1 six-inch tortilla.  3 oz (85 g) cooked rice or pasta.  4 oz (113 g) cooked dried beans.  4 oz (113 g) starchy vegetable, such as peas, corn, or potatoes.  4 oz (113 g) hot cereal.  4 oz (113 g) mashed potatoes or  of a large baked potato.  4 oz (113 g) canned or frozen fruit.  4 oz (120 mL) fruit juice.  4-6 crackers.  6 chicken nuggets.  6 oz (170 g) unsweetened dry cereal.  6 oz (170 g) plain fat-free yogurt or yogurt sweetened with artificial sweeteners.  8 oz (240 mL) milk.  8 oz (170 g) fresh fruit or one small piece of fruit.  24 oz (680 g) popped popcorn. Example of carbohydrate counting Sample meal  3 oz (85 g) chicken breast.  6 oz (  170 g) brown rice.  4 oz (113 g) corn.  8 oz (240 mL) milk.  8 oz (170 g) strawberries with sugar-free whipped topping. Carbohydrate calculation 1. Identify the foods that contain carbohydrates:  Rice.  Corn.  Milk.  Strawberries. 2. Calculate how many servings you have of each food:  2 servings rice.  1 serving corn.  1 serving milk.  1 serving strawberries. 3. Multiply each number of servings by 15 g:  2 servings rice x 15 g = 30 g.  1 serving corn x 15 g = 15 g.  1 serving milk x 15 g = 15  g.  1 serving strawberries x 15 g = 15 g. 4. Add together all of the amounts to find the total grams of carbohydrates eaten:  30 g + 15 g + 15 g + 15 g = 75 g of carbohydrates total. This information is not intended to replace advice given to you by your health care provider. Make sure you discuss any questions you have with your health care provider. Document Released: 10/18/2005 Document Revised: 05/07/2016 Document Reviewed: 03/31/2016 Elsevier Interactive Patient Education  2017 Elsevier Inc.  

## 2017-03-09 ENCOUNTER — Other Ambulatory Visit: Payer: Medicare Other

## 2017-03-09 DIAGNOSIS — R350 Frequency of micturition: Secondary | ICD-10-CM | POA: Diagnosis not present

## 2017-03-09 LAB — CMP14+EGFR
ALBUMIN: 4.4 g/dL (ref 3.6–4.8)
ALT: 47 IU/L — AB (ref 0–32)
AST: 28 IU/L (ref 0–40)
Albumin/Globulin Ratio: 2 (ref 1.2–2.2)
Alkaline Phosphatase: 99 IU/L (ref 39–117)
BILIRUBIN TOTAL: 0.2 mg/dL (ref 0.0–1.2)
BUN / CREAT RATIO: 33 — AB (ref 12–28)
BUN: 30 mg/dL — AB (ref 8–27)
CALCIUM: 9.4 mg/dL (ref 8.7–10.3)
CHLORIDE: 101 mmol/L (ref 96–106)
CO2: 19 mmol/L (ref 18–29)
CREATININE: 0.91 mg/dL (ref 0.57–1.00)
GFR calc non Af Amer: 65 mL/min/{1.73_m2} (ref >59)
GFR, EST AFRICAN AMERICAN: 75 mL/min/{1.73_m2} (ref >59)
GLUCOSE: 99 mg/dL (ref 65–99)
Globulin, Total: 2.2 g/dL (ref 1.5–4.5)
Potassium: 4.5 mmol/L (ref 3.5–5.2)
Sodium: 140 mmol/L (ref 134–144)
TOTAL PROTEIN: 6.6 g/dL (ref 6.0–8.5)

## 2017-03-09 LAB — URINALYSIS, COMPLETE
BILIRUBIN UA: NEGATIVE
Glucose, UA: NEGATIVE
KETONES UA: NEGATIVE
Nitrite, UA: POSITIVE — AB
PH UA: 5 (ref 5.0–7.5)
PROTEIN UA: NEGATIVE
SPEC GRAV UA: 1.015 (ref 1.005–1.030)
Urobilinogen, Ur: 0.2 mg/dL (ref 0.2–1.0)

## 2017-03-09 LAB — MICROSCOPIC EXAMINATION
RENAL EPITHEL UA: NONE SEEN /HPF
WBC, UA: >30 /hpf — AB (ref 0–?)

## 2017-03-09 LAB — LIPID PANEL
Chol/HDL Ratio: 7.5 ratio — ABNORMAL HIGH (ref 0.0–4.4)
Cholesterol, Total: 277 mg/dL — ABNORMAL HIGH (ref 100–199)
HDL: 37 mg/dL — AB (ref >39)
Triglycerides: 656 mg/dL (ref 0–149)

## 2017-03-10 ENCOUNTER — Telehealth: Payer: Self-pay

## 2017-03-10 ENCOUNTER — Other Ambulatory Visit: Payer: Self-pay | Admitting: Nurse Practitioner

## 2017-03-10 MED ORDER — FENOFIBRATE 145 MG PO TABS: 145.0000 mg | ORAL_TABLET | Freq: Every day | ORAL | 5 refills | Status: DC

## 2017-03-10 MED ORDER — CIPROFLOXACIN HCL 500 MG PO TABS: 500.0000 mg | ORAL_TABLET | Freq: Two times a day (BID) | ORAL | 0 refills | Status: DC

## 2017-03-10 NOTE — Telephone Encounter (Signed)
Patients husband came in to office yesterday and stated that patient took new med that was rxd and when she woke up her BS was 180. She didn't eat anything and checked it about an hour later and it was 300. He is concerned. Please advise

## 2017-03-10 NOTE — Telephone Encounter (Signed)
Increase tresiba to 60u at night

## 2017-03-10 NOTE — Telephone Encounter (Signed)
lmtcb

## 2017-03-11 NOTE — Telephone Encounter (Signed)
Pt notified of recommendation Verbalizes understanding 

## 2017-03-14 ENCOUNTER — Telehealth: Payer: Self-pay | Admitting: Nurse Practitioner

## 2017-03-14 ENCOUNTER — Other Ambulatory Visit: Payer: Self-pay | Admitting: Nurse Practitioner

## 2017-03-15 ENCOUNTER — Telehealth: Payer: Self-pay | Admitting: Nurse Practitioner

## 2017-03-15 NOTE — Telephone Encounter (Signed)
I attemted to call patien myself but did not get an answer. She is on metforminXR and was not told to increase to BID she is to continue 1 daily. She was suppose to increase her tresiba to 60u daily and l]keep diary of blood sugars.

## 2017-03-15 NOTE — Telephone Encounter (Signed)
Gennette PacMary Margaret, please advise.  I do not see anything in her office visit changing her Metformin to BID.

## 2017-03-15 NOTE — Telephone Encounter (Signed)
Spoke to pt's husband to give MMM recommendation Verbalizes understanding

## 2017-03-21 ENCOUNTER — Other Ambulatory Visit: Payer: Self-pay | Admitting: Nurse Practitioner

## 2017-03-21 DIAGNOSIS — E1140 Type 2 diabetes mellitus with diabetic neuropathy, unspecified: Secondary | ICD-10-CM

## 2017-03-21 DIAGNOSIS — F411 Generalized anxiety disorder: Secondary | ICD-10-CM

## 2017-03-22 NOTE — Telephone Encounter (Signed)
Please call in xanax with 1 refills 

## 2017-03-22 NOTE — Telephone Encounter (Signed)
Refill called to [facility] 

## 2017-03-30 ENCOUNTER — Encounter: Payer: Self-pay | Admitting: *Deleted

## 2017-04-08 ENCOUNTER — Ambulatory Visit (INDEPENDENT_AMBULATORY_CARE_PROVIDER_SITE_OTHER): Payer: Medicare Other | Admitting: Nurse Practitioner

## 2017-04-08 ENCOUNTER — Encounter: Payer: Self-pay | Admitting: Nurse Practitioner

## 2017-04-08 VITALS — BP 106/80 | HR 89 | Temp 97.7°F | Ht 61.0 in | Wt 167.8 lb

## 2017-04-08 DIAGNOSIS — E1140 Type 2 diabetes mellitus with diabetic neuropathy, unspecified: Secondary | ICD-10-CM | POA: Diagnosis not present

## 2017-04-08 LAB — BAYER DCA HB A1C WAIVED: HB A1C: 8.9 % — AB (ref <7.0)

## 2017-04-08 MED ORDER — INSULIN DEGLUDEC 200 UNIT/ML ~~LOC~~ SOPN: 64.0000 [IU] | PEN_INJECTOR | Freq: Every day | SUBCUTANEOUS | 5 refills | Status: DC

## 2017-04-08 NOTE — Progress Notes (Signed)
   Subjective:    Patient ID: Kylie Ward, female    DOB: 12/17/1945, 71 y.o.   MRN: [id number]  HPI Patient comes in today for recheck- she was seen 03/08/17 for chronic follow up. Her hgba1c was 9.5%. We added tresiba  56 daily along with meal time sliding scale.blood sugars have really improved and are mostly below 200. Had one episode of it being below 70 and she took a sugar pill which brought it riht back up.    Review of Systems  Constitutional: Negative.   Cardiovascular: Negative.   Endocrine: Negative for polydipsia, polyphagia and polyuria.  Genitourinary: Negative.   Neurological: Negative.   Psychiatric/Behavioral: Negative.   All other systems reviewed and are negative.      Objective:   Physical Exam  Constitutional: She is oriented to person, place, and time. She appears well-developed and well-nourished. No distress.  Cardiovascular: Normal rate, regular rhythm and normal heart sounds.   Pulmonary/Chest: Effort normal.  Neurological: She is alert and oriented to person, place, and time.  Skin: Skin is warm.  Psychiatric: She has a normal mood and affect. Her behavior is normal. Judgment and thought content normal.   BP 106/80   Pulse 89   Temp 97.7 F (36.5 C) (Oral)   Ht 5\' 1"  (1.549 m)   Wt 167 lb 12.8 oz (76.1 kg)   LMP 01/27/1983   BMI 31.71 kg/m   hgba1c 8.9% today       Assessment & Plan:   1. Type 2 diabetes mellitus with diabetic neuropathy, without long-term current use of insulin (HCC)    Increase tresiba to 62 units daily x1 week then to 64udaily Continue to check blood sugars 4x a day  Continue sliding scale 3x a day with meals Follow up  In 3 months  Mary-Margaret Daphine DeutscherMartin, FNP

## 2017-04-08 NOTE — Patient Instructions (Signed)

## 2017-04-16 ENCOUNTER — Other Ambulatory Visit: Payer: Self-pay | Admitting: Nurse Practitioner

## 2017-06-13 ENCOUNTER — Telehealth: Payer: Self-pay | Admitting: Nurse Practitioner

## 2017-06-13 NOTE — Progress Notes (Signed)
[id number]-cb  08/13

## 2017-06-13 NOTE — Telephone Encounter (Signed)
I called patient back however I had not called her recently.  Asked about BG - states that readings have improved.

## 2017-06-16 ENCOUNTER — Other Ambulatory Visit: Payer: Self-pay | Admitting: Nurse Practitioner

## 2017-06-16 DIAGNOSIS — E785 Hyperlipidemia, unspecified: Secondary | ICD-10-CM

## 2017-06-27 ENCOUNTER — Other Ambulatory Visit: Payer: Self-pay | Admitting: Nurse Practitioner

## 2017-06-27 DIAGNOSIS — F411 Generalized anxiety disorder: Secondary | ICD-10-CM

## 2017-06-28 NOTE — Telephone Encounter (Signed)
Please call in alprazolam with 1 refills 

## 2017-06-28 NOTE — Telephone Encounter (Signed)
Called in with one RF

## 2017-06-29 ENCOUNTER — Encounter: Payer: Medicare Other | Admitting: *Deleted

## 2017-07-06 ENCOUNTER — Other Ambulatory Visit: Payer: Self-pay | Admitting: Nurse Practitioner

## 2017-07-06 DIAGNOSIS — E785 Hyperlipidemia, unspecified: Secondary | ICD-10-CM

## 2017-07-11 ENCOUNTER — Ambulatory Visit: Payer: Medicare Other | Admitting: Nurse Practitioner

## 2017-07-19 ENCOUNTER — Encounter: Payer: Self-pay | Admitting: Nurse Practitioner

## 2017-07-19 ENCOUNTER — Ambulatory Visit (INDEPENDENT_AMBULATORY_CARE_PROVIDER_SITE_OTHER): Payer: Medicare Other | Admitting: Nurse Practitioner

## 2017-07-19 VITALS — BP 104/73 | HR 79 | Temp 97.2°F | Ht 61.0 in | Wt 169.0 lb

## 2017-07-19 DIAGNOSIS — Z6831 Body mass index (BMI) 31.0-31.9, adult: Secondary | ICD-10-CM

## 2017-07-19 DIAGNOSIS — I10 Essential (primary) hypertension: Secondary | ICD-10-CM | POA: Diagnosis not present

## 2017-07-19 DIAGNOSIS — E1140 Type 2 diabetes mellitus with diabetic neuropathy, unspecified: Secondary | ICD-10-CM | POA: Diagnosis not present

## 2017-07-19 DIAGNOSIS — I700 Atherosclerosis of aorta: Secondary | ICD-10-CM | POA: Diagnosis not present

## 2017-07-19 DIAGNOSIS — E785 Hyperlipidemia, unspecified: Secondary | ICD-10-CM | POA: Diagnosis not present

## 2017-07-19 DIAGNOSIS — R609 Edema, unspecified: Secondary | ICD-10-CM | POA: Diagnosis not present

## 2017-07-19 DIAGNOSIS — I6930 Unspecified sequelae of cerebral infarction: Secondary | ICD-10-CM | POA: Diagnosis not present

## 2017-07-19 DIAGNOSIS — R3915 Urgency of urination: Secondary | ICD-10-CM | POA: Diagnosis not present

## 2017-07-19 DIAGNOSIS — F411 Generalized anxiety disorder: Secondary | ICD-10-CM | POA: Diagnosis not present

## 2017-07-19 LAB — BAYER DCA HB A1C WAIVED: HB A1C (BAYER DCA - WAIVED): 11.2 % — ABNORMAL HIGH (ref <7.0)

## 2017-07-19 MED ORDER — FUROSEMIDE 40 MG PO TABS: 40.0000 mg | ORAL_TABLET | Freq: Every day | ORAL | 1 refills | Status: DC

## 2017-07-19 MED ORDER — METFORMIN HCL ER 750 MG PO TB24: ORAL_TABLET | ORAL | 1 refills | Status: DC

## 2017-07-19 MED ORDER — GABAPENTIN 300 MG PO CAPS: 300.0000 mg | ORAL_CAPSULE | Freq: Every day | ORAL | 1 refills | Status: DC

## 2017-07-19 MED ORDER — MIRABEGRON ER 25 MG PO TB24: 25.0000 mg | ORAL_TABLET | Freq: Every day | ORAL | 5 refills | Status: DC

## 2017-07-19 MED ORDER — INSULIN LISPRO 100 UNIT/ML ~~LOC~~ SOLN: SUBCUTANEOUS | 11 refills | Status: DC

## 2017-07-19 MED ORDER — SIMVASTATIN 80 MG PO TABS: 80.0000 mg | ORAL_TABLET | Freq: Every day | ORAL | 1 refills | Status: DC

## 2017-07-19 MED ORDER — FENOFIBRATE 160 MG PO TABS: 160.0000 mg | ORAL_TABLET | Freq: Every day | ORAL | 1 refills | Status: DC

## 2017-07-19 MED ORDER — LISINOPRIL 20 MG PO TABS: 20.0000 mg | ORAL_TABLET | Freq: Every day | ORAL | 1 refills | Status: DC

## 2017-07-19 MED ORDER — INSULIN GLARGINE 100 UNIT/ML SOLOSTAR PEN: 65.0000 [IU] | PEN_INJECTOR | Freq: Every day | SUBCUTANEOUS | 11 refills | Status: DC

## 2017-07-19 MED ORDER — LIRAGLUTIDE 18 MG/3ML ~~LOC~~ SOPN: PEN_INJECTOR | SUBCUTANEOUS | 3 refills | Status: DC

## 2017-07-19 MED ORDER — ALPRAZOLAM 1 MG PO TABS: ORAL_TABLET | ORAL | 2 refills | Status: DC

## 2017-07-19 MED ORDER — SIMVASTATIN 80 MG PO TABS
80.0000 mg | ORAL_TABLET | Freq: Every day | ORAL | 1 refills | Status: DC
Start: 2017-07-19 — End: 2017-07-19

## 2017-07-19 NOTE — Addendum Note (Signed)
Addended by: Cleda Daub on: 07/19/2017 04:04 PM   Modules accepted: Orders

## 2017-07-19 NOTE — Patient Instructions (Signed)

## 2017-07-19 NOTE — Progress Notes (Signed)
Subjective:    Patient ID: Kylie Ward, female    DOB: 11/11/1945, 71 y.o.   MRN: [id number]  HPI   Kylie Ward is here today for follow up of chronic medical problem.  Outpatient Encounter Prescriptions as of 07/19/2017  Medication Sig  . ACCU-CHEK FASTCLIX LANCETS MISC USE TO test sugar THREE times daily  . ACCU-CHEK SMARTVIEW test strip USE TO check sugar THREE times daily  . ALPRAZolam (XANAX) 1 MG tablet Take 1 Tablet by mouth 2 times a day AS NEEDED FOR ANXIETY  . aspirin 81 MG tablet Take 81 mg by mouth daily.  . Blood Glucose Monitoring Suppl (ACCU-CHEK NANO SMARTVIEW) w/Device KIT Check blood sugars 3 times a day  . Elastic Bandages & Supports (V-2 HIGH COMPRESSION HOSE) MISC 1 each by Does not apply route daily.  . fenofibrate 160 MG tablet Take 160 mg by mouth daily.  . fenofibrate 160 MG tablet Take 1 Tablet by mouth once daily  . furosemide (LASIX) 40 MG tablet Take 1 tablet (40 mg total) by mouth daily.  Marland Kitchen gabapentin (NEURONTIN) 300 MG capsule Take 1 Capsule by mouth at bedtime  . Insulin Degludec (TRESIBA FLEXTOUCH) 200 UNIT/ML SOPN Inject 64 Units into the skin daily.  . insulin lispro (HUMALOG) 100 UNIT/ML injection Sliding scale up to 8u TID  . lisinopril (PRINIVIL,ZESTRIL) 20 MG tablet Take 1 tablet (20 mg total) by mouth daily.  . metFORMIN (GLUCOPHAGE-XR) 750 MG 24 hr tablet Take 1 Tablet by mouth once daily with breakfast  . mirabegron ER (MYRBETRIQ) 25 MG TB24 tablet Take 1 tablet (25 mg total) by mouth daily.  . simvastatin (ZOCOR) 80 MG tablet Take 1 Tablet by mouth once daily  . SURE COMFORT PEN NEEDLES 31G X 8 MM MISC USE TO inject lantus ONCE daily   No facility-administered encounter medications on file as of 07/19/2017.     1. Type 2 diabetes mellitus with diabetic neuropathy, without long-term current use of insulin (HCC)  Last HGBA1c was 8.9%- patient thinks that is good and usually laughs when we talk about it. She does not watch diet at all  and really does not check blood sugars often . We increased tresiba to 62units t last visit with clinical pharmacist. She is suppose to do sliding scale with each meal but os not very cpmpliant  2. Essential hypertension, benign  No c/o chest pain, SOB or headache. Does not check blood sugars daily  3. Hyperlipidemia with target LDL less than 100  Again she does not watch diet- eats whatever she wants to  4. Late effect of cerebrovascular accident (CVA)  No permanent effects  5. Atherosclerosis of aorta ([place])  again denies chest pain and SOB  6. Peripheral edema  Usually resolves when elevating legs  7. GAD (generalized anxiety disorder)  Takes xanax BID- gets very anxious if she does not take  8. BMI 31.0-31.9,adult  No recent weight changes    New complaints: None today  Social history: Husband always comes with her to doctor.   Review of Systems  Constitutional: Negative for activity change and appetite change.  HENT: Negative.   Eyes: Negative for pain.  Respiratory: Negative for shortness of breath.   Cardiovascular: Negative for chest pain, palpitations and leg swelling.  Gastrointestinal: Negative for abdominal pain.  Endocrine: Negative for polydipsia.  Genitourinary: Negative.   Skin: Negative for rash.  Neurological: Negative for dizziness, weakness and headaches.  Hematological: Does not bruise/bleed easily.  Psychiatric/Behavioral: Negative.  All other systems reviewed and are negative.      Objective:   Physical Exam  Constitutional: She is oriented to person, place, and time. She appears well-developed and well-nourished.  HENT:  Nose: Nose normal.  Mouth/Throat: Oropharynx is clear and moist.  Eyes: EOM are normal.  Neck: Trachea normal, normal range of motion and full passive range of motion without pain. Neck supple. No JVD present. Carotid bruit is not present. No thyromegaly present.  Cardiovascular: Normal rate, regular rhythm, normal heart  sounds and intact distal pulses.  Exam reveals no gallop and no friction rub.   No murmur heard. Pulmonary/Chest: Effort normal and breath sounds normal.  Abdominal: Soft. Bowel sounds are normal. She exhibits no distension and no mass. There is no tenderness.  Musculoskeletal: Normal range of motion.  Lymphadenopathy:    She has no cervical adenopathy.  Neurological: She is alert and oriented to person, place, and time. She has normal reflexes.  Skin: Skin is warm and dry.  Psychiatric: She has a normal mood and affect. Her behavior is normal. Judgment and thought content normal.   BP 104/73   Pulse 79   Temp (!) 97.2 F (36.2 C) (Oral)   Ht _0  (1.549 m)   Wt 169 lb (76.7 kg)   LMP 01/27/1983   SpO2 97%   BMI 31.93 kg/m   HGBA1c was 11.2%      Assessment & Plan:  1. Type 2 diabetes mellitus with diabetic neuropathy, without long-term current use of insulin (HCC) Stricter carb counting Stop tresiba Change  To lantus- patient said she did better on lantus Also added victoza - hope wil help with weight loss and blood sugar Continue sliding scale - Bayer DCA Hb A1c Waived - insulin lispro (HUMALOG) 100 UNIT/ML injection; Sliding scale up to 8u TID  Dispense: 10 mL; Refill: 11 - metFORMIN (GLUCOPHAGE-XR) 750 MG 24 hr tablet; Take 1 Tablet by mouth once daily with breakfast  Dispense: 90 tablet; Refill: 1 - gabapentin (NEURONTIN) 300 MG capsule; Take 1 capsule (300 mg total) by mouth at bedtime.  Dispense: 90 capsule; Refill: 1 - Insulin Glargine (LANTUS SOLOSTAR) 100 UNIT/ML Solostar Pen; Inject 65 Units into the skin daily at 10 pm.  Dispense: 15 mL; Refill: 11 - liraglutide (VICTOZA) 18 MG/3ML SOPN; 0.45m daily x1w then 1.255mdaily  Dispense: 3 pen; Refill: 3  2. Essential hypertension, benign Low sodium diet - CMP14+EGFR - lisinopril (PRINIVIL,ZESTRIL) 20 MG tablet; Take 1 tablet (20 mg total) by mouth daily.  Dispense: 90 tablet; Refill: 1  3. Hyperlipidemia with  target LDL less than 100 Low fat diet - Lipid panel - fenofibrate 160 MG tablet; Take 1 tablet (160 mg total) by mouth daily.  Dispense: 90 tablet; Refill: 1 - simvastatin (ZOCOR) 80 MG tablet; Take 1 tablet (80 mg total) by mouth daily.  Dispense: 90 tablet; Refill: 1  4. Late effect of cerebrovascular accident (CVA)  5. Atherosclerosis of aorta ([place] 6. Peripheral edema Elevate legs when sittiing - furosemide (LASIX) 40 MG tablet; Take 1 tablet (40 mg total) by mouth daily.  Dispense: 90 tablet; Refill: 1  7. GAD (generalized anxiety disorder) Stress management - ALPRAZolam (XANAX) 1 MG tablet; Take 1 Tablet by mouth 2 times a day AS NEEDED FOR ANXIETY  Dispense: 60 tablet; Refill: 2  8. BMI 31.0-31.9,adult Discussed diet and exercise for person with BMI >25 Will recheck weight in 3-6 months  9. Urinary urgency - mirabegron ER (MYRBETRIQ) 25 MG  TB24 tablet; Take 1 tablet (25 mg total) by mouth daily.  Dispense: 30 tablet; Refill: 5    Labs pending Health maintenance reviewed Diet and exercise encouraged Continue all meds Follow up  In 3 months   [place], FNP

## 2017-07-20 LAB — CMP14+EGFR
ALBUMIN: 3.9 g/dL (ref 3.6–4.8)
ALK PHOS: 93 IU/L (ref 39–117)
ALT: 19 IU/L (ref 0–32)
AST: 16 IU/L (ref 0–40)
Albumin/Globulin Ratio: 1.6 (ref 1.2–2.2)
BUN / CREAT RATIO: 24 (ref 12–28)
BUN: 18 mg/dL (ref 8–27)
Bilirubin Total: 0.2 mg/dL (ref 0.0–1.2)
CO2: 25 mmol/L (ref 20–29)
CREATININE: 0.76 mg/dL (ref 0.57–1.00)
Calcium: 9.1 mg/dL (ref 8.7–10.3)
Chloride: 100 mmol/L (ref 96–106)
GFR calc non Af Amer: 80 mL/min/{1.73_m2} (ref >59)
GFR, EST AFRICAN AMERICAN: 93 mL/min/{1.73_m2} (ref >59)
GLOBULIN, TOTAL: 2.5 g/dL (ref 1.5–4.5)
Glucose: 151 mg/dL — ABNORMAL HIGH (ref 65–99)
Potassium: 4.6 mmol/L (ref 3.5–5.2)
SODIUM: 139 mmol/L (ref 134–144)
Total Protein: 6.4 g/dL (ref 6.0–8.5)

## 2017-07-20 LAB — LIPID PANEL
CHOLESTEROL TOTAL: 251 mg/dL — AB (ref 100–199)
Chol/HDL Ratio: 7 ratio — ABNORMAL HIGH (ref 0.0–4.4)
HDL: 36 mg/dL — ABNORMAL LOW (ref >39)
LDL CALC: 145 mg/dL — AB (ref 0–99)
Triglycerides: 352 mg/dL — ABNORMAL HIGH (ref 0–149)
VLDL Cholesterol Cal: 70 mg/dL — ABNORMAL HIGH (ref 5–40)

## 2017-07-28 ENCOUNTER — Other Ambulatory Visit: Payer: Self-pay | Admitting: Nurse Practitioner

## 2017-07-28 DIAGNOSIS — F411 Generalized anxiety disorder: Secondary | ICD-10-CM

## 2017-07-29 NOTE — Telephone Encounter (Signed)
ordered 9/18 per chart review by MMM

## 2017-07-29 NOTE — Telephone Encounter (Signed)
Last seen 07/12/17  MMM  If approved route to nurse to call into [facility]

## 2017-08-22 ENCOUNTER — Encounter: Payer: Self-pay | Admitting: Nurse Practitioner

## 2017-08-22 ENCOUNTER — Ambulatory Visit (INDEPENDENT_AMBULATORY_CARE_PROVIDER_SITE_OTHER): Payer: Medicare Other | Admitting: Nurse Practitioner

## 2017-08-22 VITALS — BP 106/84 | HR 98 | Temp 97.3°F | Ht 61.0 in | Wt 167.0 lb

## 2017-08-22 DIAGNOSIS — Z23 Encounter for immunization: Secondary | ICD-10-CM | POA: Diagnosis not present

## 2017-08-22 DIAGNOSIS — E1143 Type 2 diabetes mellitus with diabetic autonomic (poly)neuropathy: Secondary | ICD-10-CM

## 2017-08-22 DIAGNOSIS — Z794 Long term (current) use of insulin: Secondary | ICD-10-CM

## 2017-08-22 LAB — BAYER DCA HB A1C WAIVED: HB A1C (BAYER DCA - WAIVED): 9.3 % — ABNORMAL HIGH

## 2017-08-22 MED ORDER — LIRAGLUTIDE 18 MG/3ML ~~LOC~~ SOPN: PEN_INJECTOR | SUBCUTANEOUS | 3 refills | Status: DC

## 2017-08-22 NOTE — Addendum Note (Signed)
Addended by: Cleda DaubUCKER, Ashtan Girtman G on: 08/22/2017 03:27 PM   Modules accepted: Orders

## 2017-08-22 NOTE — Progress Notes (Signed)
   Subjective:    Patient ID: Kylie Ward, female    DOB: 02-06-1946, 71 y.o.   MRN: [id number]  HPI Patient comes in today for follow up of diabetes only. She was seen on 07/19/17. Her HGBA1c was 11.2%. Patient is very non compliant with meds as well as diet. We stopped her tresiba because she said she did better on lantus. She was rx lantus 65u daily. We also added victozia daily as well. She was encouraged to do sliding scale 3x a day with meals. She says that her blood sugars have been running in 90's- 140's. She said she thinks the lantus and victozia are working much better for her. She does her sliding scale with each meal.     Review of Systems  Constitutional: Negative.   HENT: Negative.   Respiratory: Negative.   Cardiovascular: Negative.   Gastrointestinal: Negative.   Genitourinary: Negative.   Neurological: Negative.   Psychiatric/Behavioral: Negative.   All other systems reviewed and are negative.      Objective:   Physical Exam  Constitutional: She is oriented to person, place, and time. She appears well-developed and well-nourished. No distress.  Cardiovascular: Normal rate and regular rhythm.   Pulmonary/Chest: Effort normal and breath sounds normal.  Neurological: She is alert and oriented to person, place, and time. She has normal reflexes. No cranial nerve deficit.  Skin: Skin is warm and dry.  Psychiatric: She has a normal mood and affect. Her behavior is normal. Judgment and thought content normal.   BP 106/84   Pulse 98   Temp (!) 97.3 F (36.3 C) (Oral)   Ht 5\' 1"  (1.549 m)   Wt 167 lb (75.8 kg)   LMP 01/27/1983   BMI 31.55 kg/m   hgba1c 9.3% today    Assessment & Plan:  Type 2 diabetes mellitus with diabetic autonomic neuropathy, with long-term current use of insulin (HCC) - Plan: Bayer DCA Hb A1c Waived, liraglutide (VICTOZA) 18 MG/3ML SOPN  Continue current meds Continue to watch diet Follow up in 3 months  Mary-Margaret Daphine DeutscherMartin, FNP

## 2017-09-28 ENCOUNTER — Other Ambulatory Visit: Payer: Self-pay | Admitting: Nurse Practitioner

## 2017-09-28 DIAGNOSIS — F411 Generalized anxiety disorder: Secondary | ICD-10-CM

## 2017-09-29 NOTE — Telephone Encounter (Signed)
Please call in xanax with 1 refills 

## 2017-09-29 NOTE — Telephone Encounter (Signed)
Rx called in to pharmacy. 

## 2017-10-18 ENCOUNTER — Telehealth: Payer: Self-pay | Admitting: Nurse Practitioner

## 2017-10-18 MED ORDER — INSULIN GLARGINE 100 UNIT/ML ~~LOC~~ SOLN: 65.0000 [IU] | Freq: Every day | SUBCUTANEOUS | 11 refills | Status: DC

## 2017-10-18 NOTE — Telephone Encounter (Signed)
Please address

## 2017-10-26 ENCOUNTER — Other Ambulatory Visit: Payer: Self-pay

## 2017-10-26 MED ORDER — INSULIN GLARGINE 100 UNIT/ML ~~LOC~~ SOLN: 65.0000 [IU] | Freq: Every day | SUBCUTANEOUS | 2 refills | Status: DC

## 2017-10-27 ENCOUNTER — Telehealth: Payer: Self-pay | Admitting: Nurse Practitioner

## 2017-10-27 MED ORDER — ACCU-CHEK NANO SMARTVIEW W/DEVICE KIT: PACK | 0 refills | Status: DC

## 2017-10-27 NOTE — Telephone Encounter (Signed)
Sent patient aware 

## 2017-11-22 ENCOUNTER — Ambulatory Visit: Payer: Medicare Other | Admitting: Nurse Practitioner

## 2017-12-06 ENCOUNTER — Encounter: Payer: Self-pay | Admitting: Nurse Practitioner

## 2017-12-06 ENCOUNTER — Ambulatory Visit: Payer: Medicare Other | Admitting: Nurse Practitioner

## 2017-12-06 VITALS — BP 124/66 | HR 94 | Temp 97.7°F | Ht 61.0 in | Wt 164.0 lb

## 2017-12-06 DIAGNOSIS — I6523 Occlusion and stenosis of bilateral carotid arteries: Secondary | ICD-10-CM | POA: Diagnosis not present

## 2017-12-06 DIAGNOSIS — R3915 Urgency of urination: Secondary | ICD-10-CM | POA: Diagnosis not present

## 2017-12-06 DIAGNOSIS — I6310 Cerebral infarction due to embolism of unspecified precerebral artery: Secondary | ICD-10-CM

## 2017-12-06 DIAGNOSIS — E1140 Type 2 diabetes mellitus with diabetic neuropathy, unspecified: Secondary | ICD-10-CM

## 2017-12-06 DIAGNOSIS — Z6831 Body mass index (BMI) 31.0-31.9, adult: Secondary | ICD-10-CM

## 2017-12-06 DIAGNOSIS — F411 Generalized anxiety disorder: Secondary | ICD-10-CM

## 2017-12-06 DIAGNOSIS — I6930 Unspecified sequelae of cerebral infarction: Secondary | ICD-10-CM | POA: Diagnosis not present

## 2017-12-06 DIAGNOSIS — E785 Hyperlipidemia, unspecified: Secondary | ICD-10-CM | POA: Diagnosis not present

## 2017-12-06 DIAGNOSIS — I10 Essential (primary) hypertension: Secondary | ICD-10-CM

## 2017-12-06 DIAGNOSIS — R609 Edema, unspecified: Secondary | ICD-10-CM | POA: Diagnosis not present

## 2017-12-06 LAB — BAYER DCA HB A1C WAIVED: HB A1C (BAYER DCA - WAIVED): 8.9 % — ABNORMAL HIGH (ref <7.0)

## 2017-12-06 MED ORDER — LIRAGLUTIDE 18 MG/3ML ~~LOC~~ SOPN
PEN_INJECTOR | SUBCUTANEOUS | 3 refills | Status: DC
Start: 2017-12-06 — End: 2018-03-06

## 2017-12-06 MED ORDER — SIMVASTATIN 80 MG PO TABS
80.0000 mg | ORAL_TABLET | Freq: Every day | ORAL | 1 refills | Status: DC
Start: 2017-12-06 — End: 2018-06-27

## 2017-12-06 MED ORDER — INSULIN GLARGINE 100 UNIT/ML ~~LOC~~ SOLN: 65.0000 [IU] | Freq: Every day | SUBCUTANEOUS | 2 refills | Status: DC

## 2017-12-06 MED ORDER — ALPRAZOLAM 1 MG PO TABS: ORAL_TABLET | ORAL | 1 refills | Status: DC

## 2017-12-06 MED ORDER — FENOFIBRATE 160 MG PO TABS: 160.0000 mg | ORAL_TABLET | Freq: Every day | ORAL | 1 refills | Status: DC

## 2017-12-06 MED ORDER — LISINOPRIL 20 MG PO TABS: 20.0000 mg | ORAL_TABLET | Freq: Every day | ORAL | 1 refills | Status: DC

## 2017-12-06 MED ORDER — FUROSEMIDE 40 MG PO TABS: 40.0000 mg | ORAL_TABLET | Freq: Every day | ORAL | 1 refills | Status: DC

## 2017-12-06 MED ORDER — INSULIN LISPRO 100 UNIT/ML ~~LOC~~ SOLN: SUBCUTANEOUS | 11 refills | Status: DC

## 2017-12-06 MED ORDER — MIRABEGRON ER 25 MG PO TB24: 25.0000 mg | ORAL_TABLET | Freq: Every day | ORAL | 5 refills | Status: DC

## 2017-12-06 MED ORDER — METFORMIN HCL ER 750 MG PO TB24: ORAL_TABLET | ORAL | 1 refills | Status: DC

## 2017-12-06 NOTE — Progress Notes (Signed)
Subjective:    Patient ID: Kylie Ward, female    DOB: [birth year], 72 y.o.   MRN: [id number]  HPI  Kylie Ward is here today for follow up of chronic medical problem.  Outpatient Encounter Medications as of 12/06/2017  Medication Sig  . ACCU-CHEK FASTCLIX LANCETS MISC USE TO check sugar THREE times daily  . ACCU-CHEK SMARTVIEW test strip USE TO check sugar THREE times daily  . ALPRAZolam (XANAX) 1 MG tablet TAKE (1) TABLET TWICE A DAY AS NEEDED FOR ANXIETY.  Marland Kitchen aspirin 81 MG tablet Take 81 mg by mouth daily.  . Blood Glucose Monitoring Suppl (ACCU-CHEK NANO SMARTVIEW) w/Device KIT Check blood sugars 3 times a day  . Elastic Bandages & Supports (V-2 HIGH COMPRESSION HOSE) MISC 1 each by Does not apply route daily.  . fenofibrate 160 MG tablet Take 1 tablet (160 mg total) by mouth daily.  . furosemide (LASIX) 40 MG tablet Take 1 tablet (40 mg total) by mouth daily.  Marland Kitchen gabapentin (NEURONTIN) 300 MG capsule Take 1 capsule (300 mg total) by mouth at bedtime.  . insulin glargine (LANTUS) 100 UNIT/ML injection Inject 0.65 mLs (65 Units total) into the skin at bedtime.  . insulin lispro (HUMALOG) 100 UNIT/ML injection Sliding scale up to 8u TID  . liraglutide (VICTOZA) 18 MG/3ML SOPN 0.45m daily x1w then 1.281mdaily  . lisinopril (PRINIVIL,ZESTRIL) 20 MG tablet Take 1 tablet (20 mg total) by mouth daily.  . metFORMIN (GLUCOPHAGE-XR) 750 MG 24 hr tablet Take 1 Tablet by mouth once daily with breakfast  . mirabegron ER (MYRBETRIQ) 25 MG TB24 tablet Take 1 tablet (25 mg total) by mouth daily.  . simvastatin (ZOCOR) 80 MG tablet Take 1 tablet (80 mg total) by mouth daily.  . SURE COMFORT PEN NEEDLES 31G X 8 MM MISC USE TO inject lantus ONCE daily     1. Type 2 diabetes mellitus with diabetic neuropathy, without long-term current use of insulin ([facility] hg[place]c was 9.3%. Fasting blood sugars are up and down. No hypoglycemia  2. Essential hypertension, benign  No chest pain, sob or  headache. Does not check blood pressure at home. BP Readings from Last 3 Encounters:  12/06/17 124/66  08/22/17 106/84  07/19/17 104/73     3. Hyperlipidemia with target LDL less than 100  Watching diet some  4. Symptomatic stenosis of both carotid arteries  No headaches or dizziness  5. Cerebrovascular accident (CVA) due to embolism of precerebral artery [place]. No residual effects   6. BMI 31.0-31.9,adult  No recent weight changes  7. GAD (generalized anxiety disorder)  On xanax 1-2 x a day- usually only takes 1x per day  8. Peripheral edema  Does not have every day. Elevates legs when occur    New complaints: Left hip and back pain- worse at  night  Social history: Her and her husband are both retired    Review of Systems  Constitutional: Negative for activity change and appetite change.  HENT: Negative.   Eyes: Negative for pain.  Respiratory: Negative for shortness of breath.   Cardiovascular: Negative for chest pain, palpitations and leg swelling.  Gastrointestinal: Negative for abdominal pain.  Endocrine: Negative for polydipsia.  Genitourinary: Negative.   Musculoskeletal: Positive for arthralgias (left hip pain).  Skin: Negative for rash.  Neurological: Negative for dizziness, weakness and headaches.  Hematological: Does not bruise/bleed easily.  Psychiatric/Behavioral: Negative.   All other systems reviewed and are negative.  Objective:   Physical Exam  Constitutional: She is oriented to person, place, and time. She appears well-developed and well-nourished.  HENT:  Nose: Nose normal.  Mouth/Throat: Oropharynx is clear and moist.  Eyes: EOM are normal.  Neck: Trachea normal, normal range of motion and full passive range of motion without pain. Neck supple. No JVD present. Carotid bruit is not present. No thyromegaly present.  Cardiovascular: Normal rate, regular rhythm, normal heart sounds and intact distal pulses. Exam reveals no  gallop and no friction rub.  No murmur heard. Pulmonary/Chest: Effort normal and breath sounds normal.  Abdominal: Soft. Bowel sounds are normal. She exhibits no distension and no mass. There is no tenderness.  Musculoskeletal: Normal range of motion.  Pain in left hip on palpation  Lymphadenopathy:    She has no cervical adenopathy.  Neurological: She is alert and oriented to person, place, and time. She has normal reflexes.  Skin: Skin is warm and dry.  Psychiatric: She has a normal mood and affect. Her behavior is normal. Judgment and thought content normal.   BP 124/66   Pulse 94   Temp 97.7 F (36.5 C) (Oral)   Ht '5\' 1"'  (1.549 m)   Wt 164 lb (74.4 kg)   LMP 01/27/1983   BMI 30.99 kg/m   hgba1c 8.9%     Assessment & Plan:  1. Type 2 diabetes mellitus with diabetic neuropathy, without long-term current use of insulin (HCC) Carb counting stricter Increase victozia to 1.8ML - Bayer DCA Hb A1c Waived - Microalbumin / creatinine urine ratio - liraglutide (VICTOZA) 18 MG/3ML SOPN; 1.8 daily  Dispense: 3 pen; Refill: 3 - metFORMIN (GLUCOPHAGE-XR) 750 MG 24 hr tablet; Take 1 Tablet by mouth once daily with breakfast  Dispense: 90 tablet; Refill: 1 - insulin lispro (HUMALOG) 100 UNIT/ML injection; Sliding scale up to 8u TID  Dispense: 10 mL; Refill: 11 - insulin glargine (LANTUS) 100 UNIT/ML injection; Inject 0.65 mLs (65 Units total) into the skin at bedtime.  Dispense: 20 mL; Refill: 2  2. Essential hypertension, benign Low sodium diet - CMP14+EGFR - lisinopril (PRINIVIL,ZESTRIL) 20 MG tablet; Take 1 tablet (20 mg total) by mouth daily.  Dispense: 90 tablet; Refill: 1  3. Hyperlipidemia with target LDL less than 100 Low fat diet - Lipid panel - simvastatin (ZOCOR) 80 MG tablet; Take 1 tablet (80 mg total) by mouth daily.  Dispense: 90 tablet; Refill: 1 - fenofibrate 160 MG tablet; Take 1 tablet (160 mg total) by mouth daily.  Dispense: 90 tablet; Refill: 1  4. Symptomatic  stenosis of both carotid arteries  5. Cerebrovascular accident (CVA) due to embolism of precerebral artery ([place])  6. BMI 31.0-31.9,adult Discussed diet and exercise for person with BMI >25 Will recheck weight in 3-6 months  7. GAD (generalized anxiety disorder) Stress management - ALPRAZolam (XANAX) 1 MG tablet; TAKE (1) TABLET TWICE A DAY AS NEEDED FOR ANXIETY.  Dispense: 60 tablet; Refill: 1  8. Peripheral edema Elevate legs when sitting - furosemide (LASIX) 40 MG tablet; Take 1 tablet (40 mg total) by mouth daily.  Dispense: 90 tablet; Refill: 1  9. Urinary urgency - mirabegron ER (MYRBETRIQ) 25 MG TB24 tablet; Take 1 tablet (25 mg total) by mouth daily.  Dispense: 30 tablet; Refill: 5    Labs pending Health maintenance reviewed Diet and exercise encouraged Continue all meds Follow up  In 3 months   Ephraim, FNP

## 2017-12-06 NOTE — Patient Instructions (Signed)

## 2017-12-07 LAB — CMP14+EGFR
A/G RATIO: 2 (ref 1.2–2.2)
ALK PHOS: 96 IU/L (ref 39–117)
ALT: 37 IU/L — ABNORMAL HIGH (ref 0–32)
AST: 25 IU/L (ref 0–40)
Albumin: 4.1 g/dL (ref 3.6–4.8)
BILIRUBIN TOTAL: 0.2 mg/dL (ref 0.0–1.2)
BUN/Creatinine Ratio: 23 (ref 12–28)
BUN: 21 mg/dL (ref 8–27)
CO2: 20 mmol/L (ref 20–29)
Calcium: 8.7 mg/dL (ref 8.7–10.3)
Chloride: 97 mmol/L (ref 96–106)
Creatinine, Ser: 0.9 mg/dL (ref 0.57–1.00)
GFR calc Af Amer: 75 mL/min/{1.73_m2} (ref >59)
GFR calc non Af Amer: 65 mL/min/{1.73_m2} (ref >59)
GLOBULIN, TOTAL: 2.1 g/dL (ref 1.5–4.5)
Glucose: 276 mg/dL — ABNORMAL HIGH (ref 65–99)
POTASSIUM: 4.8 mmol/L (ref 3.5–5.2)
Sodium: 139 mmol/L (ref 134–144)
Total Protein: 6.2 g/dL (ref 6.0–8.5)

## 2017-12-07 LAB — LIPID PANEL
CHOLESTEROL TOTAL: 172 mg/dL (ref 100–199)
Chol/HDL Ratio: 6.4 ratio — ABNORMAL HIGH (ref 0.0–4.4)
HDL: 27 mg/dL — ABNORMAL LOW (ref >39)
TRIGLYCERIDES: 428 mg/dL — AB (ref 0–149)

## 2017-12-08 ENCOUNTER — Other Ambulatory Visit: Payer: Medicare Other

## 2017-12-08 DIAGNOSIS — E1140 Type 2 diabetes mellitus with diabetic neuropathy, unspecified: Secondary | ICD-10-CM | POA: Diagnosis not present

## 2017-12-09 LAB — MICROALBUMIN / CREATININE URINE RATIO
CREATININE, UR: 98.7 mg/dL
MICROALBUM., U, RANDOM: 103.6 ug/mL
Microalb/Creat Ratio: 105 mg/g creat — ABNORMAL HIGH (ref 0.0–30.0)

## 2017-12-13 IMAGING — DX DG HIP (WITH OR WITHOUT PELVIS) 2-3V*L*
3 series · 3 of 3 positions shown · non-contrast
Comparison: None.

CLINICAL DATA: Low back pain, left side sciatica

EXAM:
DG HIP (WITH OR WITHOUT PELVIS) 2-3V LEFT

[pelvis ap]
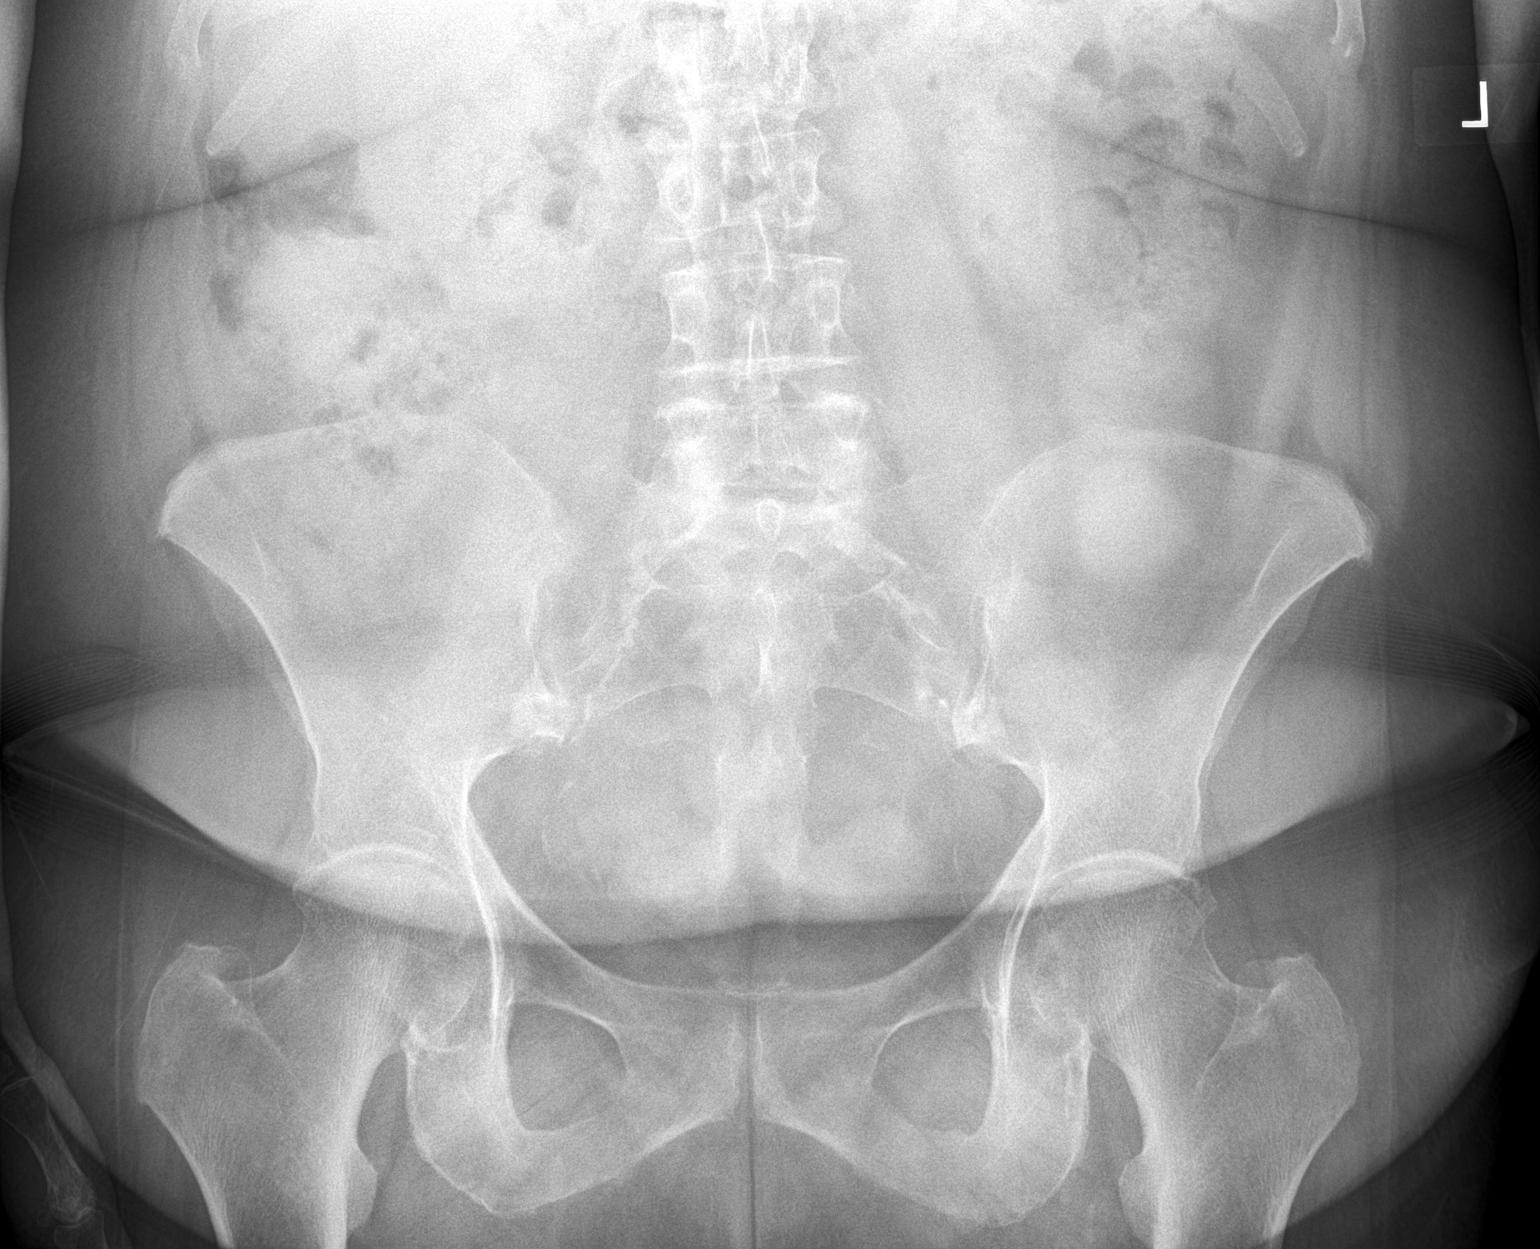

[hip ap]
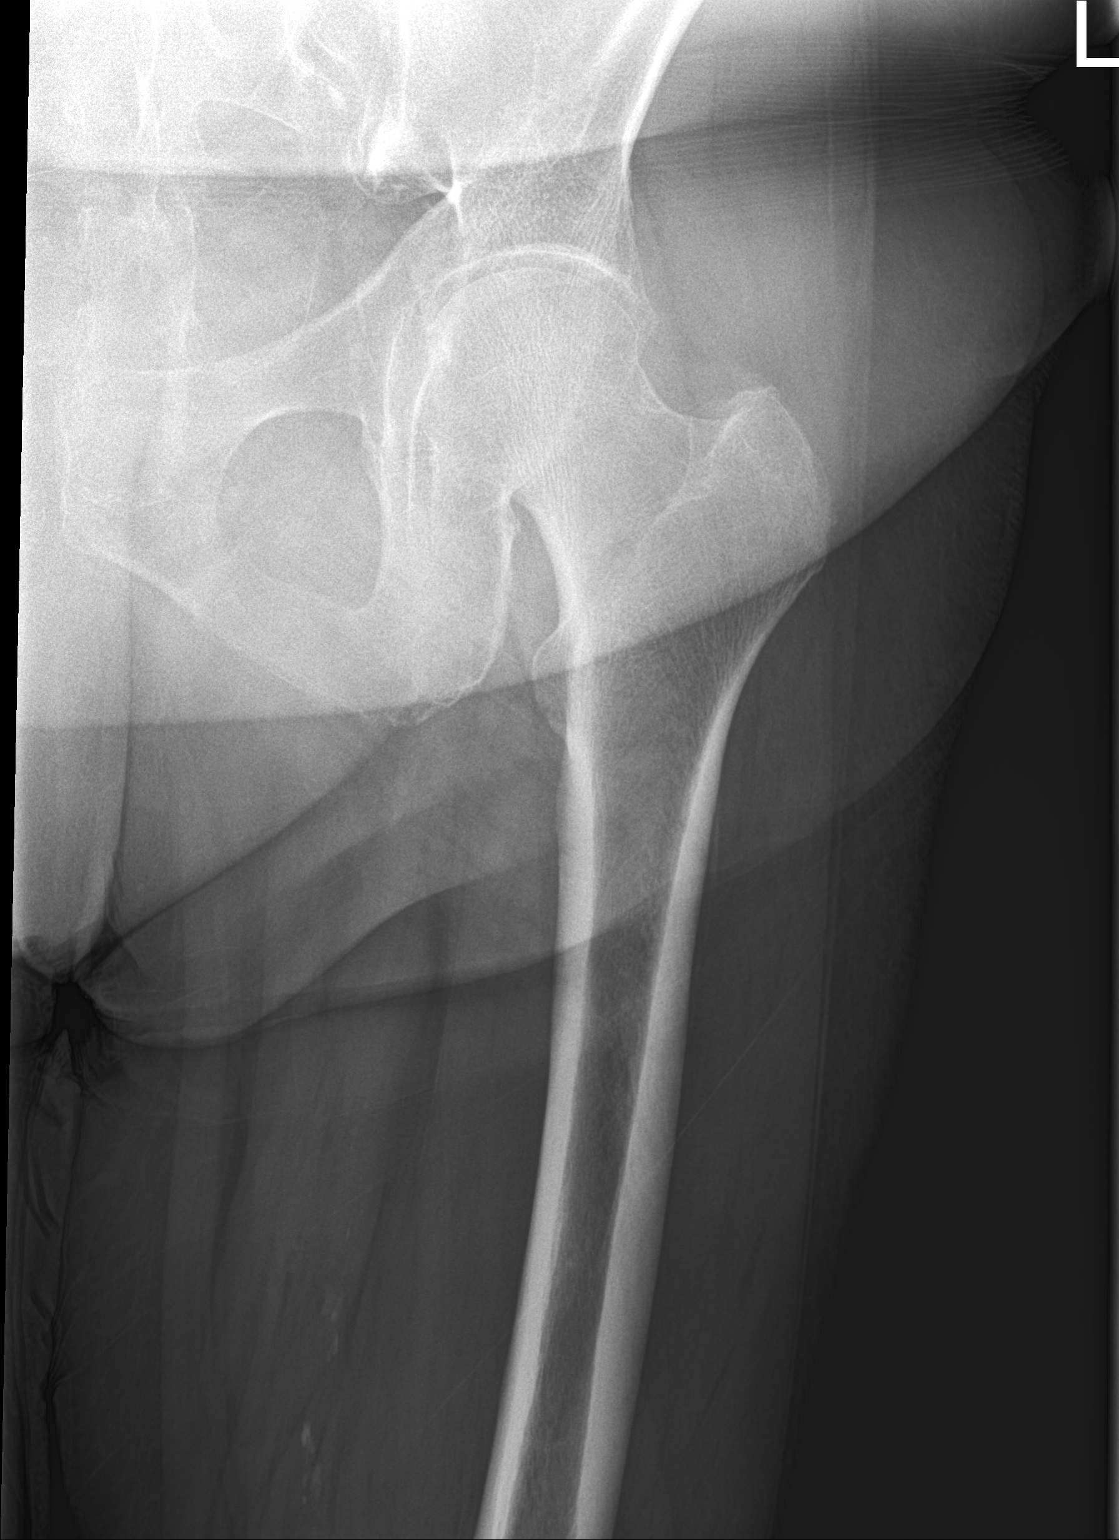

[hip lat]
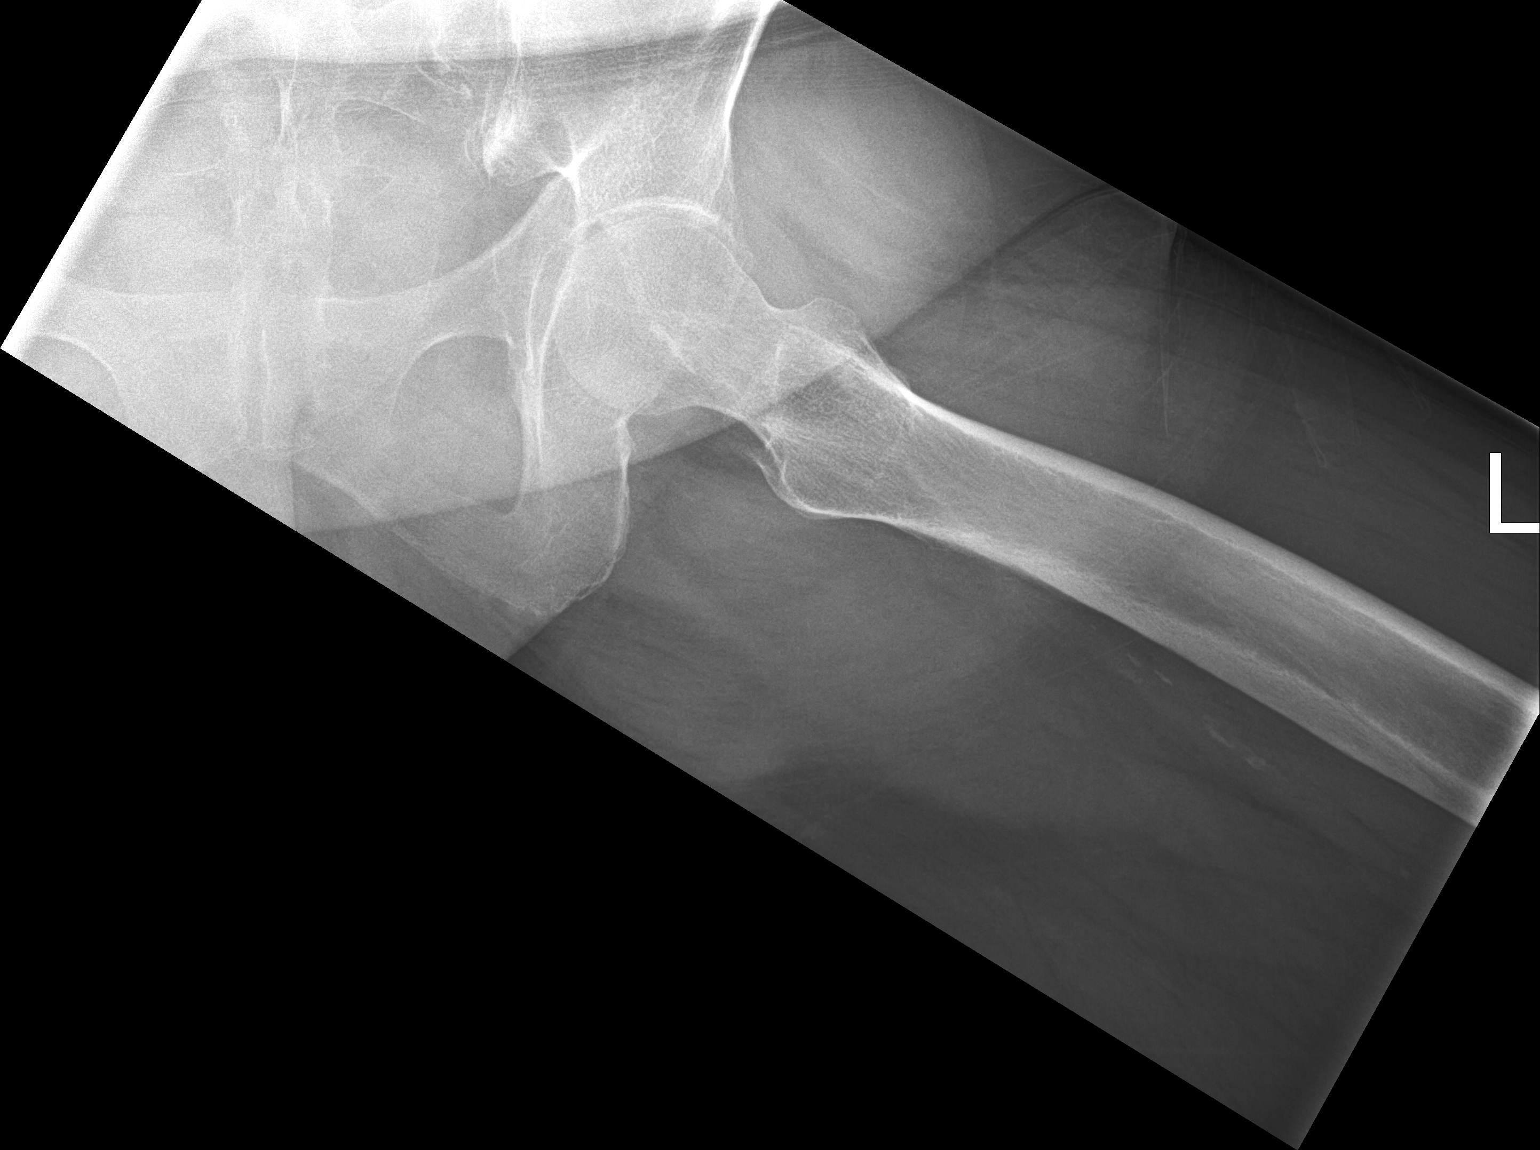

[3 of 3 positions shown; findings below may reference images not displayed]

FINDINGS: Three views of the left hip submitted. No acute fracture or
subluxation. Mild degenerative changes are noted bilateral hip
joints with mild narrowing of superior hip joint space. Mild
superior bilateral acetabular spurring. Mild degenerative changes
bilateral SI joints. Mild degenerative changes pubic symphysis.
IMPRESSION: No acute fracture or subluxation. Mild degenerative changes as
described above.

## 2018-02-08 ENCOUNTER — Other Ambulatory Visit: Payer: Self-pay | Admitting: Nurse Practitioner

## 2018-02-09 ENCOUNTER — Other Ambulatory Visit: Payer: Self-pay | Admitting: *Deleted

## 2018-02-09 MED ORDER — GLUCOSE BLOOD VI STRP: ORAL_STRIP | 11 refills | Status: DC

## 2018-03-02 DIAGNOSIS — H1132 Conjunctival hemorrhage, left eye: Secondary | ICD-10-CM | POA: Diagnosis not present

## 2018-03-06 ENCOUNTER — Encounter: Payer: Self-pay | Admitting: Nurse Practitioner

## 2018-03-06 ENCOUNTER — Ambulatory Visit (INDEPENDENT_AMBULATORY_CARE_PROVIDER_SITE_OTHER): Payer: Medicare Other | Admitting: Nurse Practitioner

## 2018-03-06 VITALS — BP 117/81 | HR 97 | Temp 97.3°F | Ht 61.0 in | Wt 163.0 lb

## 2018-03-06 DIAGNOSIS — Z1212 Encounter for screening for malignant neoplasm of rectum: Secondary | ICD-10-CM | POA: Diagnosis not present

## 2018-03-06 DIAGNOSIS — I10 Essential (primary) hypertension: Secondary | ICD-10-CM

## 2018-03-06 DIAGNOSIS — E1142 Type 2 diabetes mellitus with diabetic polyneuropathy: Secondary | ICD-10-CM | POA: Diagnosis not present

## 2018-03-06 DIAGNOSIS — Z794 Long term (current) use of insulin: Secondary | ICD-10-CM | POA: Diagnosis not present

## 2018-03-06 DIAGNOSIS — E785 Hyperlipidemia, unspecified: Secondary | ICD-10-CM | POA: Diagnosis not present

## 2018-03-06 DIAGNOSIS — Z6831 Body mass index (BMI) 31.0-31.9, adult: Secondary | ICD-10-CM | POA: Diagnosis not present

## 2018-03-06 DIAGNOSIS — I700 Atherosclerosis of aorta: Secondary | ICD-10-CM | POA: Diagnosis not present

## 2018-03-06 DIAGNOSIS — Z1211 Encounter for screening for malignant neoplasm of colon: Secondary | ICD-10-CM

## 2018-03-06 DIAGNOSIS — F411 Generalized anxiety disorder: Secondary | ICD-10-CM

## 2018-03-06 DIAGNOSIS — I6930 Unspecified sequelae of cerebral infarction: Secondary | ICD-10-CM

## 2018-03-06 DIAGNOSIS — R609 Edema, unspecified: Secondary | ICD-10-CM | POA: Diagnosis not present

## 2018-03-06 DIAGNOSIS — E1140 Type 2 diabetes mellitus with diabetic neuropathy, unspecified: Secondary | ICD-10-CM | POA: Diagnosis not present

## 2018-03-06 LAB — BAYER DCA HB A1C WAIVED: HB A1C (BAYER DCA - WAIVED): 8.8 % — ABNORMAL HIGH (ref <7.0)

## 2018-03-06 MED ORDER — LIRAGLUTIDE 18 MG/3ML ~~LOC~~ SOPN: PEN_INJECTOR | SUBCUTANEOUS | 3 refills | Status: DC

## 2018-03-06 MED ORDER — INSULIN GLARGINE 100 UNIT/ML ~~LOC~~ SOLN: 70.0000 [IU] | Freq: Every day | SUBCUTANEOUS | 2 refills | Status: DC

## 2018-03-06 MED ORDER — GABAPENTIN 300 MG PO CAPS: 300.0000 mg | ORAL_CAPSULE | Freq: Every day | ORAL | 1 refills | Status: DC

## 2018-03-06 NOTE — Progress Notes (Signed)
Subjective:    Patient ID: Kylie Ward, female    DOB: 07-12-1946, 72 y.o.   MRN: [id number]   Chief Complaint: No chief complaint on file.   HPI:  1. Essential hypertension, benign  No c/o chest pain, sob or headache. Does not check blood pressures at home. BP Readings from Last 3 Encounters:  12/06/17 124/66  08/22/17 106/84  07/19/17 104/73     2. Late effect of cerebrovascular accident (CVA)  She has no residual effects from stroke that she is aware of.  3. Atherosclerosis of aorta ([place])  This was seen on chest xray. We re currently just watching with aggressive treatment of medical problems to prevent heart attack or another stroke.  4. Peripheral edema  She has daily swelling that usually resolves by morning.  5. Hyperlipidemia with target LDL less than 100  Does not really watch diet and does no exercise  6. GAD (generalized anxiety disorder)  Takes xanax BID which helps her with her worrying.  7. BMI 31.0-31.9,adult  No recent weight changes  8. Type 2 diabetes mellitus with diabetic polyneuropathy, with long-term current use of insulin (HCC)  Last HGBA1c was 8.9%. She is very noncompliant with her diet and treatment plan. She does not like to check her blood sugars and she says that theyare all over the place. We increased her victozia dose to 1.8 at last visit. No other changes were made.    Outpatient Encounter Medications as of 03/06/2018  Medication Sig  . ACCU-CHEK FASTCLIX LANCETS MISC USE TO check sugar THREE times daily  . ALPRAZolam (XANAX) 1 MG tablet TAKE (1) TABLET TWICE A DAY AS NEEDED FOR ANXIETY.  Marland Kitchen aspirin 81 MG tablet Take 81 mg by mouth daily.  . Blood Glucose Monitoring Suppl (ACCU-CHEK NANO SMARTVIEW) w/Device KIT CHECK BLOOD SUGER UP TO 3 TIMES A DAY  . Elastic Bandages & Supports (V-2 HIGH COMPRESSION HOSE) MISC 1 each by Does not apply route daily.  . fenofibrate 160 MG tablet Take 1 tablet (160 mg total) by mouth daily.  . furosemide  (LASIX) 40 MG tablet Take 1 tablet (40 mg total) by mouth daily.  Marland Kitchen gabapentin (NEURONTIN) 300 MG capsule Take 1 capsule (300 mg total) by mouth at bedtime.  Marland Kitchen glucose blood (ACCU-CHEK SMARTVIEW) test strip USE TO check sugar THREE times daily  . insulin glargine (LANTUS) 100 UNIT/ML injection Inject 0.65 mLs (65 Units total) into the skin at bedtime.  . insulin lispro (HUMALOG) 100 UNIT/ML injection Sliding scale up to 8u TID  . liraglutide (VICTOZA) 18 MG/3ML SOPN 1.8 daily  . lisinopril (PRINIVIL,ZESTRIL) 20 MG tablet Take 1 tablet (20 mg total) by mouth daily.  . metFORMIN (GLUCOPHAGE-XR) 750 MG 24 hr tablet Take 1 Tablet by mouth once daily with breakfast  . mirabegron ER (MYRBETRIQ) 25 MG TB24 tablet Take 1 tablet (25 mg total) by mouth daily.  . simvastatin (ZOCOR) 80 MG tablet Take 1 tablet (80 mg total) by mouth daily.  . SURE COMFORT PEN NEEDLES 31G X 8 MM MISC USE TO inject lantus ONCE daily      New complaints: None today  Social history: Lives with her husband   Review of Systems  Constitutional: Negative for activity change and appetite change.  HENT: Negative.   Eyes: Negative for pain.  Respiratory: Negative for shortness of breath.   Cardiovascular: Negative for chest pain, palpitations and leg swelling.  Gastrointestinal: Negative for abdominal pain.  Endocrine: Negative for polydipsia.  Genitourinary:  Negative.   Skin: Negative for rash.  Neurological: Negative for dizziness, weakness and headaches.  Hematological: Does not bruise/bleed easily.  Psychiatric/Behavioral: Negative.   All other systems reviewed and are negative.      Objective:   Physical Exam  Constitutional: She is oriented to person, place, and time.  HENT:  Head: Normocephalic.  Nose: Nose normal.  Mouth/Throat: Oropharynx is clear and moist.  Eyes: Pupils are equal, round, and reactive to light. EOM are normal.  Neck: Normal range of motion. Neck supple. No JVD present. Carotid bruit  is not present.  Cardiovascular: Normal rate, regular rhythm, normal heart sounds and intact distal pulses.  Pulmonary/Chest: Effort normal and breath sounds normal. No respiratory distress. She has no wheezes. She has no rales. She exhibits no tenderness.  Abdominal: Soft. Normal appearance, normal aorta and bowel sounds are normal. She exhibits no distension, no abdominal bruit, no pulsatile midline mass and no mass. There is no splenomegaly or hepatomegaly. There is no tenderness.  Musculoskeletal: Normal range of motion. She exhibits no edema.  Lymphadenopathy:    She has no cervical adenopathy.  Neurological: She is alert and oriented to person, place, and time. She has normal reflexes.  Skin: Skin is warm and dry.  Psychiatric: Judgment normal.   BP 117/81   Pulse 97   Temp (!) 97.3 F (36.3 C) (Oral)   Ht _0  (1.549 m)   Wt 163 lb (73.9 kg)   LMP 01/27/1983   BMI 30.80 kg/m   hgba1c 8.8%       Assessment & Plan:  Kylie Ward comes in today with chief complaint of No chief complaint on file.   Diagnosis and orders addressed:  1. Essential hypertension, benign Low sodium diet - CMP14+EGFR  2. Late effect of cerebrovascular accident (CVA) Report any more stroke system immediately  3. Atherosclerosis of aorta ([place]) Will watch for  now  4. Peripheral edema Elevate legs when sitting  5. Hyperlipidemia with target LDL less than 100 Low fat diet - Lipid panel  6. GAD (generalized anxiety disorder) Stress management  7. BMI 31.0-31.9,adult Discussed diet and exercise for person with BMI >25 Will recheck weight in 3-6 months  8. Type 2 diabetes mellitus with diabetic polyneuropathy, with long-term current use of insulin (HCC) Increase lantus to 70u nightly  continue all other meds - Bayer DCA Hb A1c Waived- insulin glargine (LANTUS) 100 UNIT/ML injection; Inject 0.7 mLs (70 Units total) into the skin at bedtime.  Dispense: 20 mL; Refill: 2 -  liraglutide (VICTOZA) 18 MG/3ML SOPN; 1.8 daily  Dispense: 3 pen; Refill: 3 - gabapentin (NEURONTIN) 300 MG capsule; Take 1 capsule (300 mg total) by mouth at bedtime.  Dispense: 90 capsule; Refill: 1   9. Encounter for colorectal cancer screening - Cologuard  Keep appointment for eye exam Refuses mammogram and dexascan Labs pending Health Maintenance reviewed Diet and exercise encouraged  Follow up plan: 3 months   Mary-Margaret Hassell Done, FNP

## 2018-03-06 NOTE — Patient Instructions (Signed)
   Your doctor has prescribed Cologuard, an easy-to-use, noninvasive test for colon cancer screening, based on the latest advances in stool DNA science.   Here's what will happen next:  1. You may receive a call or email from [facility] to confirm your mailing address and insurance information 2. Your kit will be shipped directly to you 3. You collet your stool sample in the privacy of your own home 4. You return the kit via UPS prepaid shipping or pick-up, in the same box it arrived in 5. You doctor will contact you with the results once they are available  Screening for colon cancer is very important to your good health, so if you have any questions at all, please call [facility] Customer Support Specialists at [phone]. They are available 24 hours a day, 6 days a week.    

## 2018-03-07 LAB — CMP14+EGFR
ALK PHOS: 85 IU/L (ref 39–117)
ALT: 21 IU/L (ref 0–32)
AST: 19 IU/L (ref 0–40)
Albumin/Globulin Ratio: 1.6 (ref 1.2–2.2)
Albumin: 4.1 g/dL (ref 3.6–4.8)
BUN/Creatinine Ratio: 12 (ref 12–28)
BUN: 10 mg/dL (ref 8–27)
Bilirubin Total: 0.3 mg/dL (ref 0.0–1.2)
CALCIUM: 9.7 mg/dL (ref 8.7–10.3)
CO2: 25 mmol/L (ref 20–29)
CREATININE: 0.85 mg/dL (ref 0.57–1.00)
Chloride: 103 mmol/L (ref 96–106)
GFR calc Af Amer: 81 mL/min/{1.73_m2} (ref >59)
GFR, EST NON AFRICAN AMERICAN: 70 mL/min/{1.73_m2} (ref >59)
GLOBULIN, TOTAL: 2.5 g/dL (ref 1.5–4.5)
GLUCOSE: 102 mg/dL — AB (ref 65–99)
Potassium: 4.9 mmol/L (ref 3.5–5.2)
Sodium: 142 mmol/L (ref 134–144)
Total Protein: 6.6 g/dL (ref 6.0–8.5)

## 2018-03-07 LAB — LIPID PANEL
CHOL/HDL RATIO: 5.1 ratio — AB (ref 0.0–4.4)
CHOLESTEROL TOTAL: 220 mg/dL — AB (ref 100–199)
HDL: 43 mg/dL (ref >39)
LDL CALC: 128 mg/dL — AB (ref 0–99)
TRIGLYCERIDES: 245 mg/dL — AB (ref 0–149)
VLDL CHOLESTEROL CAL: 49 mg/dL — AB (ref 5–40)

## 2018-04-13 DIAGNOSIS — H04122 Dry eye syndrome of left lacrimal gland: Secondary | ICD-10-CM | POA: Diagnosis not present

## 2018-04-13 DIAGNOSIS — H524 Presbyopia: Secondary | ICD-10-CM | POA: Diagnosis not present

## 2018-04-20 ENCOUNTER — Encounter (INDEPENDENT_AMBULATORY_CARE_PROVIDER_SITE_OTHER): Payer: Medicare Other | Admitting: Ophthalmology

## 2018-04-20 DIAGNOSIS — E113311 Type 2 diabetes mellitus with moderate nonproliferative diabetic retinopathy with macular edema, right eye: Secondary | ICD-10-CM

## 2018-04-20 DIAGNOSIS — H43813 Vitreous degeneration, bilateral: Secondary | ICD-10-CM

## 2018-04-20 DIAGNOSIS — I10 Essential (primary) hypertension: Secondary | ICD-10-CM

## 2018-04-20 DIAGNOSIS — E113392 Type 2 diabetes mellitus with moderate nonproliferative diabetic retinopathy without macular edema, left eye: Secondary | ICD-10-CM | POA: Diagnosis not present

## 2018-04-20 DIAGNOSIS — E11311 Type 2 diabetes mellitus with unspecified diabetic retinopathy with macular edema: Secondary | ICD-10-CM

## 2018-04-20 DIAGNOSIS — H35033 Hypertensive retinopathy, bilateral: Secondary | ICD-10-CM | POA: Diagnosis not present

## 2018-04-20 DIAGNOSIS — H2513 Age-related nuclear cataract, bilateral: Secondary | ICD-10-CM

## 2018-04-20 LAB — HM DIABETES EYE EXAM

## 2018-05-03 ENCOUNTER — Other Ambulatory Visit: Payer: Self-pay | Admitting: Nurse Practitioner

## 2018-05-03 DIAGNOSIS — F411 Generalized anxiety disorder: Secondary | ICD-10-CM

## 2018-05-05 NOTE — Telephone Encounter (Signed)
Last seen 03/06/18  MMM

## 2018-05-16 ENCOUNTER — Encounter: Payer: Self-pay | Admitting: *Deleted

## 2018-05-25 DIAGNOSIS — H31091 Other chorioretinal scars, right eye: Secondary | ICD-10-CM | POA: Diagnosis not present

## 2018-05-25 DIAGNOSIS — H2513 Age-related nuclear cataract, bilateral: Secondary | ICD-10-CM | POA: Diagnosis not present

## 2018-05-25 DIAGNOSIS — H02834 Dermatochalasis of left upper eyelid: Secondary | ICD-10-CM | POA: Diagnosis not present

## 2018-05-25 DIAGNOSIS — E113293 Type 2 diabetes mellitus with mild nonproliferative diabetic retinopathy without macular edema, bilateral: Secondary | ICD-10-CM | POA: Diagnosis not present

## 2018-05-25 DIAGNOSIS — H02831 Dermatochalasis of right upper eyelid: Secondary | ICD-10-CM | POA: Diagnosis not present

## 2018-06-05 DIAGNOSIS — H25011 Cortical age-related cataract, right eye: Secondary | ICD-10-CM | POA: Diagnosis not present

## 2018-06-05 DIAGNOSIS — H2512 Age-related nuclear cataract, left eye: Secondary | ICD-10-CM | POA: Diagnosis not present

## 2018-06-06 ENCOUNTER — Ambulatory Visit: Payer: Medicare Other | Admitting: Nurse Practitioner

## 2018-06-27 ENCOUNTER — Encounter: Payer: Self-pay | Admitting: Nurse Practitioner

## 2018-06-27 ENCOUNTER — Ambulatory Visit (INDEPENDENT_AMBULATORY_CARE_PROVIDER_SITE_OTHER): Payer: Medicare Other | Admitting: Nurse Practitioner

## 2018-06-27 VITALS — BP 122/84 | HR 92 | Temp 98.3°F | Ht 61.0 in | Wt 165.0 lb

## 2018-06-27 DIAGNOSIS — E785 Hyperlipidemia, unspecified: Secondary | ICD-10-CM | POA: Diagnosis not present

## 2018-06-27 DIAGNOSIS — I6930 Unspecified sequelae of cerebral infarction: Secondary | ICD-10-CM

## 2018-06-27 DIAGNOSIS — I6523 Occlusion and stenosis of bilateral carotid arteries: Secondary | ICD-10-CM

## 2018-06-27 DIAGNOSIS — Z794 Long term (current) use of insulin: Secondary | ICD-10-CM

## 2018-06-27 DIAGNOSIS — Z6831 Body mass index (BMI) 31.0-31.9, adult: Secondary | ICD-10-CM

## 2018-06-27 DIAGNOSIS — I10 Essential (primary) hypertension: Secondary | ICD-10-CM

## 2018-06-27 DIAGNOSIS — F411 Generalized anxiety disorder: Secondary | ICD-10-CM

## 2018-06-27 DIAGNOSIS — R609 Edema, unspecified: Secondary | ICD-10-CM

## 2018-06-27 DIAGNOSIS — E1142 Type 2 diabetes mellitus with diabetic polyneuropathy: Secondary | ICD-10-CM

## 2018-06-27 LAB — BAYER DCA HB A1C WAIVED: HB A1C: 8.5 % — AB (ref <7.0)

## 2018-06-27 MED ORDER — ALPRAZOLAM 1 MG PO TABS: 1.0000 mg | ORAL_TABLET | Freq: Two times a day (BID) | ORAL | 2 refills | Status: DC

## 2018-06-27 MED ORDER — GABAPENTIN 300 MG PO CAPS: 300.0000 mg | ORAL_CAPSULE | Freq: Every day | ORAL | 1 refills | Status: DC

## 2018-06-27 MED ORDER — LIRAGLUTIDE 18 MG/3ML ~~LOC~~ SOPN: PEN_INJECTOR | SUBCUTANEOUS | 3 refills | Status: DC

## 2018-06-27 MED ORDER — ACCU-CHEK NANO SMARTVIEW W/DEVICE KIT: PACK | 0 refills | Status: DC

## 2018-06-27 MED ORDER — SIMVASTATIN 80 MG PO TABS: 80.0000 mg | ORAL_TABLET | Freq: Every day | ORAL | 1 refills | Status: DC

## 2018-06-27 MED ORDER — INSULIN GLARGINE 100 UNIT/ML ~~LOC~~ SOLN: 80.0000 [IU] | Freq: Every day | SUBCUTANEOUS | 2 refills | Status: DC

## 2018-06-27 MED ORDER — LISINOPRIL 20 MG PO TABS: 20.0000 mg | ORAL_TABLET | Freq: Every day | ORAL | 1 refills | Status: DC

## 2018-06-27 MED ORDER — FENOFIBRATE 160 MG PO TABS: 160.0000 mg | ORAL_TABLET | Freq: Every day | ORAL | 1 refills | Status: DC

## 2018-06-27 MED ORDER — METFORMIN HCL ER 750 MG PO TB24: ORAL_TABLET | ORAL | 1 refills | Status: DC

## 2018-06-27 MED ORDER — FUROSEMIDE 40 MG PO TABS: 40.0000 mg | ORAL_TABLET | Freq: Every day | ORAL | 1 refills | Status: DC

## 2018-06-27 NOTE — Patient Instructions (Signed)

## 2018-06-27 NOTE — Progress Notes (Signed)
Subjective:    Patient ID: Kylie Ward, female    DOB: 15-Jan-1946, 72 y.o.   MRN: [id number]   Chief Complaint: Medical Management of Chronic issues  HPI:  1. Essential hypertension, benign  No c/o chest pain, sob or headache. Does not check blood pressures at home. BP Readings from Last 3 Encounters:  03/06/18 117/81  12/06/17 124/66  08/22/17 106/84     2. Symptomatic stenosis of both carotid arteries  She denies any recent problems. Denies any dizzineSs or headaches  3. Type 2 diabetes mellitus with diabetic polyneuropathy, with long-term current use of insulin (HCC) last HGBA1c was 8.8%. She is very noncompliant with diet and meds. BLOOD SUGARS HAVE BEEN RANGING 120-150.  4. Peripheral edema  edema is worse in the evenings and is better in mornings.  5. Late effect of cerebrovascular accident (CVA)  She currently has no residual effects that she is aware of.  6. Hyperlipidemia with target LDL less than 100  Does not watch diet and doe snot exercise.  7. GAD (generalized anxiety disorder)  Takes xanax bid. Stays very anxious even when taking xanax  8. BMI 31.0-31.9,adult  No recent weight changes    Outpatient Encounter Medications as of 06/27/2018  Medication Sig  . ACCU-CHEK FASTCLIX LANCETS MISC USE TO check sugar THREE times daily  . ALPRAZolam (XANAX) 1 MG tablet TAKE (1) TABLET TWICE A DAY AS NEEDED FOR ANXIETY.  Marland Kitchen aspirin 81 MG tablet Take 81 mg by mouth daily.  . Blood Glucose Monitoring Suppl (ACCU-CHEK NANO SMARTVIEW) w/Device KIT CHECK BLOOD SUGER UP TO 3 TIMES A DAY  . Elastic Bandages & Supports (V-2 HIGH COMPRESSION HOSE) MISC 1 each by Does not apply route daily.  . fenofibrate 160 MG tablet Take 1 tablet (160 mg total) by mouth daily.  . furosemide (LASIX) 40 MG tablet Take 1 tablet (40 mg total) by mouth daily.  Marland Kitchen gabapentin (NEURONTIN) 300 MG capsule Take 1 capsule (300 mg total) by mouth at bedtime.  Marland Kitchen glucose blood (ACCU-CHEK SMARTVIEW) test  strip USE TO check sugar THREE times daily  . insulin glargine (LANTUS) 100 UNIT/ML injection Inject 0.7 mLs (70 Units total) into the skin at bedtime.  . insulin lispro (HUMALOG) 100 UNIT/ML injection Sliding scale up to 8u TID  . liraglutide (VICTOZA) 18 MG/3ML SOPN 1.8 daily  . lisinopril (PRINIVIL,ZESTRIL) 20 MG tablet Take 1 tablet (20 mg total) by mouth daily.  . metFORMIN (GLUCOPHAGE-XR) 750 MG 24 hr tablet Take 1 Tablet by mouth once daily with breakfast  . mirabegron ER (MYRBETRIQ) 25 MG TB24 tablet Take 1 tablet (25 mg total) by mouth daily.  . simvastatin (ZOCOR) 80 MG tablet Take 1 tablet (80 mg total) by mouth daily.  . SURE COMFORT PEN NEEDLES 31G X 8 MM MISC USE TO inject lantus ONCE daily       New complaints: None today  Social history: Lives with husband who is very healthy   Review of Systems  Constitutional: Negative for activity change and appetite change.  HENT: Negative.   Eyes: Negative for pain.  Respiratory: Negative for shortness of breath.   Cardiovascular: Negative for chest pain, palpitations and leg swelling.  Gastrointestinal: Negative for abdominal pain.  Endocrine: Negative for polydipsia.  Genitourinary: Negative.   Skin: Negative for rash.  Neurological: Negative for dizziness, weakness and headaches.  Hematological: Does not bruise/bleed easily.  Psychiatric/Behavioral: Negative.   All other systems reviewed and are negative.  Objective:   Physical Exam  Constitutional: She is oriented to person, place, and time. She appears well-developed and well-nourished. No distress.  HENT:  Head: Normocephalic.  Nose: Nose normal.  Mouth/Throat: Oropharynx is clear and moist.  Eyes: Pupils are equal, round, and reactive to light. EOM are normal.  Neck: Normal range of motion. Neck supple. No JVD present. Carotid bruit is not present.  Cardiovascular: Normal rate, regular rhythm, normal heart sounds and intact distal pulses.    Pulmonary/Chest: Effort normal and breath sounds normal. No respiratory distress. She has no wheezes. She has no rales. She exhibits no tenderness.  Abdominal: Soft. Normal appearance, normal aorta and bowel sounds are normal. She exhibits no distension, no abdominal bruit, no pulsatile midline mass and no mass. There is no splenomegaly or hepatomegaly. There is no tenderness.  Musculoskeletal: Normal range of motion. She exhibits no edema.  Lymphadenopathy:    She has no cervical adenopathy.  Neurological: She is alert and oriented to person, place, and time. She has normal reflexes.  Skin: Skin is warm and dry.  Psychiatric: She has a normal mood and affect. Her behavior is normal. Judgment and thought content normal.  Nursing note and vitals reviewed.  BP 122/84 (BP Location: Left Arm, Cuff Size: Normal)   Pulse 92   Temp 98.3 F (36.8 C) (Oral)   Ht '5\' 1"'$  (1.549 m)   Wt 165 lb (74.8 kg)   LMP 01/27/1983   BMI 31.18 kg/m        Assessment & Plan:  DAINA CARA comes in today with chief complaint of Medical Management of Chronic Issues   Diagnosis and orders addressed:  1. Essential hypertension, benign Low sodium diet - CMP14+EGFR - lisinopril (PRINIVIL,ZESTRIL) 20 MG tablet; Take 1 tablet (20 mg total) by mouth daily.  Dispense: 90 tablet; Refill: 1  2. Symptomatic stenosis of both carotid arteries  3. Type 2 diabetes mellitus with diabetic polyneuropathy, with long-term current use of insulin (HCC) Stricter carb counting Increase lantus 75u for 3 days and then 80u - Bayer DCA Hb A1c Waived- insulin glargine (LANTUS) 100 UNIT/ML injection; Inject 0.8 mLs (80 Units total) into the skin at bedtime.  Dispense: 20 mL; Refill: 2 - gabapentin (NEURONTIN) 300 MG capsule; Take 1 capsule (300 mg total) by mouth at bedtime.  Dispense: 90 capsule; Refill: 1 - liraglutide (VICTOZA) 18 MG/3ML SOPN; 1.8 daily  Dispense: 3 pen; Refill: 3 - metFORMIN (GLUCOPHAGE-XR) 750 MG 24 hr  tablet; Take 1 Tablet by mouth once daily with breakfast  Dispense: 90 tablet; Refill: 1  4. Peripheral edema elevate legs when sitting - furosemide (LASIX) 40 MG tablet; Take 1 tablet (40 mg total) by mouth daily.  Dispense: 90 tablet; Refill: 1  5. Late effect of cerebrovascular accident (CVA)  6. Hyperlipidemia with target LDL less than 100 Low fat diet - Lipid panel - simvastatin (ZOCOR) 80 MG tablet; Take 1 tablet (80 mg total) by mouth daily.  Dispense: 90 tablet; Refill: 1 - fenofibrate 160 MG tablet; Take 1 tablet (160 mg total) by mouth daily.  Dispense: 90 tablet; Refill: 1  7. GAD (generalized anxiety disorder) Stress management - ALPRAZolam (XANAX) 1 MG tablet; Take 1 tablet (1 mg total) by mouth 2 (two) times daily.  Dispense: 60 tablet; Refill: 2  8. BMI 31.0-31.9,adult Discussed diet and exercise for person with BMI >25 Will recheck weight in 3-6 months    Labs pending Health Maintenance reviewed Diet and exercise encouraged  Follow up  plan: 3 months   Mary-Margaret Hassell Done, FNP

## 2018-06-28 LAB — LIPID PANEL
CHOL/HDL RATIO: 4.7 ratio — AB (ref 0.0–4.4)
Cholesterol, Total: 193 mg/dL (ref 100–199)
HDL: 41 mg/dL (ref >39)
LDL CALC: 92 mg/dL (ref 0–99)
Triglycerides: 298 mg/dL — ABNORMAL HIGH (ref 0–149)
VLDL CHOLESTEROL CAL: 60 mg/dL — AB (ref 5–40)

## 2018-06-28 LAB — CMP14+EGFR
ALBUMIN: 4.6 g/dL (ref 3.5–4.8)
ALT: 20 IU/L (ref 0–32)
AST: 15 IU/L (ref 0–40)
Albumin/Globulin Ratio: 1.7 (ref 1.2–2.2)
Alkaline Phosphatase: 103 IU/L (ref 39–117)
BUN / CREAT RATIO: 15 (ref 12–28)
BUN: 13 mg/dL (ref 8–27)
Bilirubin Total: 0.3 mg/dL (ref 0.0–1.2)
CALCIUM: 9.8 mg/dL (ref 8.7–10.3)
CO2: 24 mmol/L (ref 20–29)
CREATININE: 0.87 mg/dL (ref 0.57–1.00)
Chloride: 105 mmol/L (ref 96–106)
GFR calc Af Amer: 78 mL/min/{1.73_m2} (ref >59)
GFR, EST NON AFRICAN AMERICAN: 68 mL/min/{1.73_m2} (ref >59)
GLOBULIN, TOTAL: 2.7 g/dL (ref 1.5–4.5)
Glucose: 120 mg/dL — ABNORMAL HIGH (ref 65–99)
Potassium: 4.6 mmol/L (ref 3.5–5.2)
SODIUM: 144 mmol/L (ref 134–144)
TOTAL PROTEIN: 7.3 g/dL (ref 6.0–8.5)

## 2018-08-01 DIAGNOSIS — H2511 Age-related nuclear cataract, right eye: Secondary | ICD-10-CM | POA: Diagnosis not present

## 2018-08-07 DIAGNOSIS — H2511 Age-related nuclear cataract, right eye: Secondary | ICD-10-CM | POA: Diagnosis not present

## 2018-08-21 ENCOUNTER — Encounter (INDEPENDENT_AMBULATORY_CARE_PROVIDER_SITE_OTHER): Payer: Medicare Other | Admitting: Ophthalmology

## 2018-09-05 ENCOUNTER — Ambulatory Visit: Payer: Medicare Other

## 2018-09-07 ENCOUNTER — Other Ambulatory Visit: Payer: Self-pay

## 2018-09-07 MED ORDER — ACCU-CHEK FASTCLIX LANCETS MISC: 9 refills | Status: DC

## 2018-09-21 ENCOUNTER — Other Ambulatory Visit: Payer: Self-pay | Admitting: Nurse Practitioner

## 2018-09-21 DIAGNOSIS — E1140 Type 2 diabetes mellitus with diabetic neuropathy, unspecified: Secondary | ICD-10-CM

## 2018-10-02 ENCOUNTER — Ambulatory Visit (INDEPENDENT_AMBULATORY_CARE_PROVIDER_SITE_OTHER): Payer: Medicare Other | Admitting: Nurse Practitioner

## 2018-10-02 ENCOUNTER — Encounter: Payer: Self-pay | Admitting: Nurse Practitioner

## 2018-10-02 VITALS — BP 116/66 | HR 101 | Temp 97.3°F | Ht 61.0 in | Wt 165.0 lb

## 2018-10-02 DIAGNOSIS — E1142 Type 2 diabetes mellitus with diabetic polyneuropathy: Secondary | ICD-10-CM

## 2018-10-02 DIAGNOSIS — Z794 Long term (current) use of insulin: Secondary | ICD-10-CM | POA: Diagnosis not present

## 2018-10-02 DIAGNOSIS — I6930 Unspecified sequelae of cerebral infarction: Secondary | ICD-10-CM

## 2018-10-02 DIAGNOSIS — Z6831 Body mass index (BMI) 31.0-31.9, adult: Secondary | ICD-10-CM

## 2018-10-02 DIAGNOSIS — Z23 Encounter for immunization: Secondary | ICD-10-CM | POA: Diagnosis not present

## 2018-10-02 DIAGNOSIS — F411 Generalized anxiety disorder: Secondary | ICD-10-CM

## 2018-10-02 DIAGNOSIS — R609 Edema, unspecified: Secondary | ICD-10-CM

## 2018-10-02 DIAGNOSIS — I10 Essential (primary) hypertension: Secondary | ICD-10-CM

## 2018-10-02 DIAGNOSIS — E785 Hyperlipidemia, unspecified: Secondary | ICD-10-CM

## 2018-10-02 DIAGNOSIS — I700 Atherosclerosis of aorta: Secondary | ICD-10-CM | POA: Diagnosis not present

## 2018-10-02 LAB — BAYER DCA HB A1C WAIVED: HB A1C (BAYER DCA - WAIVED): 8.3 % — ABNORMAL HIGH (ref <7.0)

## 2018-10-02 MED ORDER — FENOFIBRATE 160 MG PO TABS: 160.0000 mg | ORAL_TABLET | Freq: Every day | ORAL | 1 refills | Status: DC

## 2018-10-02 MED ORDER — INSULIN GLARGINE 100 UNIT/ML ~~LOC~~ SOLN: SUBCUTANEOUS | 6 refills | Status: DC

## 2018-10-02 MED ORDER — LISINOPRIL 20 MG PO TABS: 20.0000 mg | ORAL_TABLET | Freq: Every day | ORAL | 1 refills | Status: DC

## 2018-10-02 MED ORDER — ALPRAZOLAM 1 MG PO TABS: 1.0000 mg | ORAL_TABLET | Freq: Two times a day (BID) | ORAL | 2 refills | Status: DC

## 2018-10-02 MED ORDER — FUROSEMIDE 40 MG PO TABS: 40.0000 mg | ORAL_TABLET | Freq: Every day | ORAL | 1 refills | Status: DC

## 2018-10-02 MED ORDER — SIMVASTATIN 80 MG PO TABS: 80.0000 mg | ORAL_TABLET | Freq: Every day | ORAL | 1 refills | Status: DC

## 2018-10-02 MED ORDER — INSULIN LISPRO 100 UNIT/ML ~~LOC~~ SOLN: SUBCUTANEOUS | 11 refills | Status: DC

## 2018-10-02 NOTE — Patient Instructions (Signed)

## 2018-10-02 NOTE — Progress Notes (Signed)
Subjective:    Patient ID: Kylie Ward, female    DOB: 1946-04-21, 72 y.o.   MRN: [id number]   Chief Complaint: medical management of chronic issues  HPI:  1. Type 2 diabetes mellitus with diabetic polyneuropathy, with long-term current use of insulin (Ambia) last hgba1c was 8.5%. We increased her lantus to 80u daily. Blood sugars have been 150-180 in mornings. Sometimes it is in low 100's. She feels it has improved some. She is only been giving herself 65 u nightly-   2. Essential hypertension, benign  No c/o chest pain, sob or headache. Does not check blood pressure at home. BP Readings from Last 3 Encounters:  06/27/18 122/84  03/06/18 117/81  12/06/17 124/66     3. Hyperlipidemia with target LDL less than 100  Not watching diet and does no exercise.  4. Atherosclerosis of aorta ([place])  Has not seen cardiology in several years. Says she doe snot want to go see them.  5. BMI 31.0-31.9,adult  No recent weight changes  6. GAD (generalized anxiety disorder)  Is on xanax 81m 2x a day. Works well to keep her from feeling so anxious.  7. Late effect of cerebrovascular accident (CVA)  Currently has no permanent affects that she can tell  8. Peripheral edema  Has occasional edema. Will usually resolve if she elevates her feet.    Outpatient Encounter Medications as of 10/02/2018  Medication Sig  . ACCU-CHEK FASTCLIX LANCETS MISC USE TO check sugar THREE times daily  . ALPRAZolam (XANAX) 1 MG tablet Take 1 tablet (1 mg total) by mouth 2 (two) times daily.  .Marland Kitchenaspirin 81 MG tablet Take 81 mg by mouth daily.  . Blood Glucose Monitoring Suppl (ACCU-CHEK NANO SMARTVIEW) w/Device KIT CHECK BLOOD SUGER UP TO 3 TIMES A DAY  . Elastic Bandages & Supports (V-2 HIGH COMPRESSION HOSE) MISC 1 each by Does not apply route daily.  . fenofibrate 160 MG tablet Take 1 tablet (160 mg total) by mouth daily.  . furosemide (LASIX) 40 MG tablet Take 1 tablet (40 mg total) by mouth daily.  .Marland Kitchengabapentin  (NEURONTIN) 300 MG capsule Take 1 capsule (300 mg total) by mouth at bedtime.  .Marland Kitchenglucose blood (ACCU-CHEK SMARTVIEW) test strip USE TO check sugar THREE times daily  . insulin lispro (HUMALOG) 100 UNIT/ML injection Sliding scale up to 8u TID  . LANTUS 100 UNIT/ML injection INJECT 65 UNITS AT BEDTIME  . liraglutide (VICTOZA) 18 MG/3ML SOPN 1.8 daily  . lisinopril (PRINIVIL,ZESTRIL) 20 MG tablet Take 1 tablet (20 mg total) by mouth daily.  . metFORMIN (GLUCOPHAGE-XR) 750 MG 24 hr tablet Take 1 Tablet by mouth once daily with breakfast  . mirabegron ER (MYRBETRIQ) 25 MG TB24 tablet Take 1 tablet (25 mg total) by mouth daily.  . simvastatin (ZOCOR) 80 MG tablet Take 1 tablet (80 mg total) by mouth daily.  . SURE COMFORT PEN NEEDLES 31G X 8 MM MISC USE TO inject lantus ONCE daily       New complaints: None today  Social history: Lives with husband who is fairly healthy   Review of Systems  Constitutional: Negative for activity change and appetite change.  HENT: Negative.   Eyes: Negative for pain.  Respiratory: Negative for shortness of breath.   Cardiovascular: Negative for chest pain, palpitations and leg swelling.  Gastrointestinal: Negative for abdominal pain.  Endocrine: Negative for polydipsia.  Genitourinary: Negative.   Skin: Negative for rash.  Neurological: Negative for dizziness, weakness and headaches.  Hematological: Does not bruise/bleed easily.  Psychiatric/Behavioral: Negative.   All other systems reviewed and are negative.      Objective:   Physical Exam  Constitutional: She is oriented to person, place, and time. She appears well-developed and well-nourished. No distress.  HENT:  Head: Normocephalic.  Nose: Nose normal.  Mouth/Throat: Oropharynx is clear and moist.  Eyes: Pupils are equal, round, and reactive to light. EOM are normal.  Neck: Normal range of motion. Neck supple. No JVD present. Carotid bruit is not present.  Cardiovascular: Normal rate,  regular rhythm, normal heart sounds and intact distal pulses.  Pulmonary/Chest: Effort normal and breath sounds normal. No respiratory distress. She has no wheezes. She has no rales. She exhibits no tenderness.  Abdominal: Soft. Normal appearance, normal aorta and bowel sounds are normal. She exhibits no distension, no abdominal bruit, no pulsatile midline mass and no mass. There is no splenomegaly or hepatomegaly. There is no tenderness.  Musculoskeletal: Normal range of motion. She exhibits no edema.  Lymphadenopathy:    She has no cervical adenopathy.  Neurological: She is alert and oriented to person, place, and time. She has normal reflexes.  Skin: Skin is warm and dry.  Psychiatric: She has a normal mood and affect. Her behavior is normal. Judgment and thought content normal.  Nursing note and vitals reviewed.   BP 116/66   Pulse (!) 101   Temp (!) 97.3 F (36.3 C) (Oral)   Ht _0  (1.549 m)   Wt 165 lb (74.8 kg)   LMP 01/27/1983   BMI 31.18 kg/m hgba1c 8.3%      Assessment & Plan:  Kylie Ward comes in today with chief complaint of Medical Management of Chronic Issues   Diagnosis and orders addressed:  1. Type 2 diabetes mellitus with diabetic polyneuropathy, with long-term current use of insulin (HCC) Stricter carb counting Please increase lantus as instructed - Bayer DCA Hb A1c Waived - insulin glargine (LANTUS) 100 UNIT/ML injection; INJECT 80 UNITS AT BEDTIME  Dispense: 20 mL; Refill: 6 - insulin lispro (HUMALOG) 100 UNIT/ML injection; Sliding scale up to 12u TID  Dispense: 10 mL; Refill: 11  2. Essential hypertension, benign Low sodium diet - CMP14+EGFR - lisinopril (PRINIVIL,ZESTRIL) 20 MG tablet; Take 1 tablet (20 mg total) by mouth daily.  Dispense: 90 tablet; Refill: 1  3. Hyperlipidemia with target LDL less than 100 Low fat diet - Lipid panel - simvastatin (ZOCOR) 80 MG tablet; Take 1 tablet (80 mg total) by mouth daily.  Dispense: 90 tablet;  Refill: 1 - fenofibrate 160 MG tablet; Take 1 tablet (160 mg total) by mouth daily.  Dispense: 90 tablet; Refill: 1  4. Atherosclerosis of aorta ([place])  5. BMI 31.0-31.9,adult Discussed diet and exercise for person with BMI >25 Will recheck weight in 3-6 months  6. GAD (generalized anxiety disorder) Stress management - ALPRAZolam (XANAX) 1 MG tablet; Take 1 tablet (1 mg total) by mouth 2 (two) times daily.  Dispense: 60 tablet; Refill: 2  7. Late effect of cerebrovascular accident (CVA)  8. Peripheral edema - furosemide (LASIX) 40 MG tablet; Take 1 tablet (40 mg total) by mouth daily.  Dispense: 90 tablet; Refill: 1   Labs pending Health Maintenance reviewed Diet and exercise encouraged  Follow up plan: 3 months   Mary-Margaret Hassell Done, FNP

## 2018-10-03 LAB — CMP14+EGFR
A/G RATIO: 2 (ref 1.2–2.2)
ALBUMIN: 4.3 g/dL (ref 3.5–4.8)
ALT: 24 IU/L (ref 0–32)
AST: 19 IU/L (ref 0–40)
Alkaline Phosphatase: 90 IU/L (ref 39–117)
BILIRUBIN TOTAL: 0.3 mg/dL (ref 0.0–1.2)
BUN / CREAT RATIO: 24 (ref 12–28)
BUN: 20 mg/dL (ref 8–27)
CALCIUM: 9.3 mg/dL (ref 8.7–10.3)
CHLORIDE: 99 mmol/L (ref 96–106)
CO2: 23 mmol/L (ref 20–29)
Creatinine, Ser: 0.82 mg/dL (ref 0.57–1.00)
GFR, EST AFRICAN AMERICAN: 84 mL/min/{1.73_m2} (ref >59)
GFR, EST NON AFRICAN AMERICAN: 73 mL/min/{1.73_m2} (ref >59)
GLOBULIN, TOTAL: 2.2 g/dL (ref 1.5–4.5)
Glucose: 122 mg/dL — ABNORMAL HIGH (ref 65–99)
POTASSIUM: 5.2 mmol/L (ref 3.5–5.2)
Sodium: 140 mmol/L (ref 134–144)
TOTAL PROTEIN: 6.5 g/dL (ref 6.0–8.5)

## 2018-10-03 LAB — LIPID PANEL
CHOL/HDL RATIO: 5.5 ratio — AB (ref 0.0–4.4)
Cholesterol, Total: 204 mg/dL — ABNORMAL HIGH (ref 100–199)
HDL: 37 mg/dL — AB (ref >39)
Triglycerides: 403 mg/dL — ABNORMAL HIGH (ref 0–149)

## 2018-10-13 DIAGNOSIS — Z8673 Personal history of transient ischemic attack (TIA), and cerebral infarction without residual deficits: Secondary | ICD-10-CM | POA: Diagnosis not present

## 2018-10-13 DIAGNOSIS — R42 Dizziness and giddiness: Secondary | ICD-10-CM | POA: Diagnosis not present

## 2018-10-13 DIAGNOSIS — E1165 Type 2 diabetes mellitus with hyperglycemia: Secondary | ICD-10-CM | POA: Diagnosis not present

## 2018-10-13 DIAGNOSIS — S0081XA Abrasion of other part of head, initial encounter: Secondary | ICD-10-CM | POA: Diagnosis not present

## 2018-10-13 DIAGNOSIS — I10 Essential (primary) hypertension: Secondary | ICD-10-CM | POA: Diagnosis not present

## 2018-10-13 DIAGNOSIS — S0003XA Contusion of scalp, initial encounter: Secondary | ICD-10-CM | POA: Diagnosis not present

## 2018-10-13 DIAGNOSIS — S299XXA Unspecified injury of thorax, initial encounter: Secondary | ICD-10-CM | POA: Diagnosis not present

## 2018-10-13 DIAGNOSIS — R0789 Other chest pain: Secondary | ICD-10-CM | POA: Diagnosis not present

## 2018-10-13 DIAGNOSIS — R0781 Pleurodynia: Secondary | ICD-10-CM | POA: Diagnosis not present

## 2018-10-13 DIAGNOSIS — S20212A Contusion of left front wall of thorax, initial encounter: Secondary | ICD-10-CM | POA: Diagnosis not present

## 2018-10-13 DIAGNOSIS — S0990XA Unspecified injury of head, initial encounter: Secondary | ICD-10-CM | POA: Diagnosis not present

## 2018-10-13 DIAGNOSIS — W1811XA Fall from or off toilet without subsequent striking against object, initial encounter: Secondary | ICD-10-CM | POA: Diagnosis not present

## 2018-10-13 DIAGNOSIS — R410 Disorientation, unspecified: Secondary | ICD-10-CM | POA: Diagnosis not present

## 2018-10-13 DIAGNOSIS — W19XXXA Unspecified fall, initial encounter: Secondary | ICD-10-CM | POA: Diagnosis not present

## 2018-10-17 ENCOUNTER — Other Ambulatory Visit: Payer: Self-pay | Admitting: Nurse Practitioner

## 2018-10-17 MED ORDER — FREESTYLE LIBRE 14 DAY SENSOR MISC: 1.0000 | 12 refills | Status: DC

## 2018-10-17 MED ORDER — FREESTYLE LIBRE 14 DAY READER DEVI: 1.0000 | Freq: Every day | 0 refills | Status: DC

## 2018-12-11 ENCOUNTER — Encounter: Payer: Medicare Other | Admitting: *Deleted

## 2018-12-14 ENCOUNTER — Encounter: Payer: Self-pay | Admitting: Nurse Practitioner

## 2019-01-02 ENCOUNTER — Ambulatory Visit: Payer: Medicare Other | Admitting: Nurse Practitioner

## 2019-01-09 ENCOUNTER — Ambulatory Visit (INDEPENDENT_AMBULATORY_CARE_PROVIDER_SITE_OTHER): Payer: Medicare Other

## 2019-01-09 ENCOUNTER — Ambulatory Visit (INDEPENDENT_AMBULATORY_CARE_PROVIDER_SITE_OTHER): Payer: Medicare Other | Admitting: *Deleted

## 2019-01-09 ENCOUNTER — Encounter: Payer: Self-pay | Admitting: *Deleted

## 2019-01-09 VITALS — BP 108/84 | HR 98 | Ht 61.0 in | Wt 165.0 lb

## 2019-01-09 DIAGNOSIS — M810 Age-related osteoporosis without current pathological fracture: Secondary | ICD-10-CM | POA: Diagnosis not present

## 2019-01-09 DIAGNOSIS — Z1211 Encounter for screening for malignant neoplasm of colon: Secondary | ICD-10-CM

## 2019-01-09 DIAGNOSIS — R195 Other fecal abnormalities: Secondary | ICD-10-CM | POA: Diagnosis not present

## 2019-01-09 DIAGNOSIS — M8588 Other specified disorders of bone density and structure, other site: Secondary | ICD-10-CM | POA: Diagnosis not present

## 2019-01-09 DIAGNOSIS — Z780 Asymptomatic menopausal state: Secondary | ICD-10-CM | POA: Diagnosis not present

## 2019-01-09 DIAGNOSIS — Z23 Encounter for immunization: Secondary | ICD-10-CM | POA: Diagnosis not present

## 2019-01-09 DIAGNOSIS — Z0000 Encounter for general adult medical examination without abnormal findings: Secondary | ICD-10-CM

## 2019-01-09 DIAGNOSIS — Z0001 Encounter for general adult medical examination with abnormal findings: Secondary | ICD-10-CM | POA: Diagnosis not present

## 2019-01-09 DIAGNOSIS — Z1212 Encounter for screening for malignant neoplasm of rectum: Secondary | ICD-10-CM

## 2019-01-09 NOTE — Patient Instructions (Addendum)
Please work on your goal of increasing your exercise to 5 times per week for 30 minutes.    Please review the information given on Advance Directives.  If you complete the paperwork, please bring a copy to our office to be filed in your medical record.   Please come in for your mammogram on 02/16/2019 at 2 pm here at [facility] on the mobile unit.   Please continue to move carefully to avoid falls.   Please follow up with Chevis Pretty, FNP as scheduled.   Cologuard  Your provider has prescribed Cologuard, an easy-to-use, noninvasive test for colon cancer screening, based on the latest advances in stool DNA science.   Here's what will happen next:  1. You may receive a call or email from [facility] to confirm your mailing address and insurance information 2. Your kit will be shipped directly to you 3. You collect your stool sample in the privacy of your own home. Please follow the instructions that come with the kit 4. You return the kit via [facility] shipping or pick-up, in the same box it arrived in 5. You should receive a call with the results once they are available. If you do not receive a call, please contact our office at [phone]  Insurance Coverage  Cologuard is covered by Medicare and most major insurers Cologuard is covered by Medicare and Medicare Advantage with no co-pay or deductible for eligible patients ages 61-85. Nationwide, more than 94% of Cologuard patients have no out-of-pocket cost for screening. . Based on the [place] should be covered by most private insurers with no co-pay or deductible for eligible patients (ages 92-75; at average risk for colon cancer; without symptoms). Currently, ~74% of Cologuard patients 45-49 have had no out-of-pocket cost for screening.  . Many national and regional payers have begun paying for CRC screening at 59. [employer] continues to work with payers to expand coverage and access for  patients ages 20-49.   Only your healthcare insurance provider can confirm how Cologuard will be covered for you. If you have questions about coverage, you can contact your insurance company directly or ask the specialists at [employer] to do that for you. A Customer Support Specialist can be reached at [phone].    Patient Support Screening for colon cancer is very important to your good health, so if you have any questions at all, please call [facility] Customer Support Specialists at [phone]. They are available 24 hours a day, 6 days a week. An instructional video is available to view online at [email or web address]   *Information and graphics obtained from [email or web address]  Thank you for coming in for your Annual Wellness Visit today!   Preventive Care 65 Years and Older, Female Preventive care refers to lifestyle choices and visits with your health care provider that can promote health and wellness. What does preventive care include?  A yearly physical exam. This is also called an annual well check.  Dental exams once or twice a year.  Routine eye exams. Ask your health care provider how often you should have your eyes checked.  Personal lifestyle choices, including: ? Daily care of your teeth and gums. ? Regular physical activity. ? Eating a healthy diet. ? Avoiding tobacco and drug use. ? Limiting alcohol use. ? Practicing safe sex. ? Taking low-dose aspirin every day. ? Taking vitamin and mineral supplements as recommended by your health care provider. What happens during an annual well check? The services  and screenings done by your health care provider during your annual well check will depend on your age, overall health, lifestyle risk factors, and family history of disease. Counseling Your health care provider may ask you questions about your:  Alcohol use.  Tobacco use.  Drug use.  Emotional well-being.  Home and relationship  well-being.  Sexual activity.  Eating habits.  History of falls.  Memory and ability to understand (cognition).  Work and work Statistician.  Reproductive health.  Screening You may have the following tests or measurements:  Height, weight, and BMI.  Blood pressure.  Lipid and cholesterol levels. These may be checked every 5 years, or more frequently if you are over 4 years old.  Skin check.  Lung cancer screening. You may have this screening every year starting at age 76 if you have a 30-pack-year history of smoking and currently smoke or have quit within the past 15 years.  Colorectal cancer screening. All adults should have this screening starting at age 31 and continuing until age 83. You will have tests every 1-10 years, depending on your results and the type of screening test. People at increased risk should start screening at an earlier age. Screening tests may include: ? Guaiac-based fecal occult blood testing. ? Fecal immunochemical test (FIT). ? Stool DNA test. ? Virtual colonoscopy. ? Sigmoidoscopy. During this test, a flexible tube with a tiny camera (sigmoidoscope) is used to examine your rectum and lower colon. The sigmoidoscope is inserted through your anus into your rectum and lower colon. ? Colonoscopy. During this test, a long, thin, flexible tube with a tiny camera (colonoscope) is used to examine your entire colon and rectum.  Hepatitis C blood test.  Hepatitis B blood test.  Sexually transmitted disease (STD) testing.  Diabetes screening. This is done by checking your blood sugar (glucose) after you have not eaten for a while (fasting). You may have this done every 1-3 years.  Bone density scan. This is done to screen for osteoporosis. You may have this done starting at age 5.  Mammogram. This may be done every 1-2 years. Talk to your health care provider about how often you should have regular mammograms. Talk with your health care provider about  your test results, treatment options, and if necessary, the need for more tests. Vaccines Your health care provider may recommend certain vaccines, such as:  Influenza vaccine. This is recommended every year.  Tetanus, diphtheria, and acellular pertussis (Tdap, Td) vaccine. You may need a Td booster every 10 years.  Varicella vaccine. You may need this if you have not been vaccinated.  Zoster vaccine. You may need this after age 42.  Measles, mumps, and rubella (MMR) vaccine. You may need at least one dose of MMR if you were born in 1957 or later. You may also need a second dose.  Pneumococcal 13-valent conjugate (PCV13) vaccine. One dose is recommended after age 66.  Pneumococcal polysaccharide (PPSV23) vaccine. One dose is recommended after age 71.  Meningococcal vaccine. You may need this if you have certain conditions.  Hepatitis A vaccine. You may need this if you have certain conditions or if you travel or work in places where you may be exposed to hepatitis A.  Hepatitis B vaccine. You may need this if you have certain conditions or if you travel or work in places where you may be exposed to hepatitis B.  Haemophilus influenzae type b (Hib) vaccine. You may need this if you have certain conditions. Talk to  your health care provider about which screenings and vaccines you need and how often you need them. This information is not intended to replace advice given to you by your health care provider. Make sure you discuss any questions you have with your health care provider. Document Released: 11/14/2015 Document Revised: 12/08/2017 Document Reviewed: 08/19/2015 Elsevier Interactive Patient Education  2019 [place] Prevention in the Home, Adult Falls can cause injuries. They can happen to people of all ages. There are many things you can do to make your home safe and to help prevent falls. Ask for help when making these changes, if needed. What actions can I take to  prevent falls? General Instructions  Use good lighting in all rooms. Replace any light bulbs that burn out.  Turn on the lights when you go into a dark area. Use night-lights.  Keep items that you use often in easy-to-reach places. Lower the shelves around your home if necessary.  Set up your furniture so you have a clear path. Avoid moving your furniture around.  Do not have throw rugs and other things on the floor that can make you trip.  Avoid walking on wet floors.  If any of your floors are uneven, fix them.  Add color or contrast paint or tape to clearly mark and help you see: ? Any grab bars or handrails. ? First and last steps of stairways. ? Where the edge of each step is.  If you use a stepladder: ? Make sure that it is fully opened. Do not climb a closed stepladder. ? Make sure that both sides of the stepladder are locked into place. ? Ask someone to hold the stepladder for you while you use it.  If there are any pets around you, be aware of where they are. What can I do in the bathroom?      Keep the floor dry. Clean up any water that spills onto the floor as soon as it happens.  Remove soap buildup in the tub or shower regularly.  Use non-skid mats or decals on the floor of the tub or shower.  Attach bath mats securely with double-sided, non-slip rug tape.  If you need to sit down in the shower, use a plastic, non-slip stool.  Install grab bars by the toilet and in the tub and shower. Do not use towel bars as grab bars. What can I do in the bedroom?  Make sure that you have a light by your bed that is easy to reach.  Do not use any sheets or blankets that are too big for your bed. They should not hang down onto the floor.  Have a firm chair that has side arms. You can use this for support while you get dressed. What can I do in the kitchen?  Clean up any spills right away.  If you need to reach something above you, use a strong step stool that has a  grab bar.  Keep electrical cords out of the way.  Do not use floor polish or wax that makes floors slippery. If you must use wax, use non-skid floor wax. What can I do with my stairs?  Do not leave any items on the stairs.  Make sure that you have a light switch at the top of the stairs and the bottom of the stairs. If you do not have them, ask someone to add them for you.  Make sure that there are handrails on both sides of  the stairs, and use them. Fix handrails that are broken or loose. Make sure that handrails are as long as the stairways.  Install non-slip stair treads on all stairs in your home.  Avoid having throw rugs at the top or bottom of the stairs. If you do have throw rugs, attach them to the floor with carpet tape.  Choose a carpet that does not hide the edge of the steps on the stairway.  Check any carpeting to make sure that it is firmly attached to the stairs. Fix any carpet that is loose or worn. What can I do on the outside of my home?  Use bright outdoor lighting.  Regularly fix the edges of walkways and driveways and fix any cracks.  Remove anything that might make you trip as you walk through a door, such as a raised step or threshold.  Trim any bushes or trees on the path to your home.  Regularly check to see if handrails are loose or broken. Make sure that both sides of any steps have handrails.  Install guardrails along the edges of any raised decks and porches.  Clear walking paths of anything that might make someone trip, such as tools or rocks.  Have any leaves, snow, or ice cleared regularly.  Use sand or salt on walking paths during winter.  Clean up any spills in your garage right away. This includes grease or oil spills. What other actions can I take?  Wear shoes that: ? Have a low heel. Do not wear high heels. ? Have rubber bottoms. ? Are comfortable and fit you well. ? Are closed at the toe. Do not wear open-toe sandals.  Use tools  that help you move around (mobility aids) if they are needed. These include: ? Canes. ? Walkers. ? Scooters. ? Crutches.  Review your medicines with your doctor. Some medicines can make you feel dizzy. This can increase your chance of falling. Ask your doctor what other things you can do to help prevent falls. Where to find more information  Centers for Disease Control and Prevention, STEADI: https://garcia.biz/  [employer] on Aging: BrainJudge.co.uk Contact a doctor if:  You are afraid of falling at home.  You feel weak, drowsy, or dizzy at home.  You fall at home. Summary  There are many simple things that you can do to make your home safe and to help prevent falls.  Ways to make your home safe include removing tripping hazards and installing grab bars in the bathroom.  Ask for help when making these changes in your home. This information is not intended to replace advice given to you by your health care provider. Make sure you discuss any questions you have with your health care provider. Document Released: 08/14/2009 Document Revised: 06/02/2017 Document Reviewed: 06/02/2017 Elsevier Interactive Patient Education  2019 [employer].

## 2019-01-09 NOTE — Progress Notes (Addendum)
Subjective:   Kylie Ward is a 73 y.o. female who presents for a subsequent Medicare Annual Wellness Visit.  Kylie Ward is accompanied today by her husband of 24 years, Kylie Ward.  She worked at [employer] until she decided to stay at home with her daughter when she was young.  She enjoys shopping and watching television.  She lives at home with her husband and their dog.  They have one daughter, 2 grandchildren, and 2 great grandchildren.    Patient Care Team: Kylie Pretty, FNP as PCP - General (Nurse Practitioner) Kylie Broad, MD as Consulting Physician (Physical Medicine and Rehabilitation)  Hospitalizations, surgeries, and ER visits in previous 12 months No hospitalizations or surgeries in the past year.  Had one ER visit for evaluation after a fall in 10/2018.   Review of Systems    Patient reports that her overall health is worse compared to last year because of weight gain.  Upon reviewing the chart- patient has been relatively the same weight for over 4 years.   Cardiac Risk Factors include: advanced age (>15mn, >>45women);diabetes mellitus;dyslipidemia;hypertension;sedentary lifestyle;obesity (BMI >30kg/m2)   All other systems negative       Current Medications (verified) Outpatient Encounter Medications as of 01/09/2019  Medication Sig  . ACCU-CHEK FASTCLIX LANCETS MISC USE TO check sugar THREE times daily  . ALPRAZolam (XANAX) 1 MG tablet Take 1 tablet (1 mg total) by mouth 2 (two) times daily.  .Marland Kitchenaspirin 81 MG tablet Take 81 mg by mouth daily.  . Blood Glucose Monitoring Suppl (ACCU-CHEK NANO SMARTVIEW) w/Device KIT CHECK BLOOD SUGER UP TO 3 TIMES A DAY  . Elastic Bandages & Supports (V-2 HIGH COMPRESSION HOSE) MISC 1 each by Does not apply route daily.  . fenofibrate 160 MG tablet Take 1 tablet (160 mg total) by mouth daily.  . furosemide (LASIX) 40 MG tablet Take 1 tablet (40 mg total) by mouth daily.  .Marland Kitchengabapentin (NEURONTIN) 300 MG capsule Take 1 capsule  (300 mg total) by mouth at bedtime.  .Marland Kitchenglucose blood (ACCU-CHEK SMARTVIEW) test strip USE TO check sugar THREE times daily  . insulin glargine (LANTUS) 100 UNIT/ML injection INJECT 80 UNITS AT BEDTIME  . insulin lispro (HUMALOG) 100 UNIT/ML injection Sliding scale up to 12u TID  . liraglutide (VICTOZA) 18 MG/3ML SOPN 1.8 daily  . lisinopril (PRINIVIL,ZESTRIL) 20 MG tablet Take 1 tablet (20 mg total) by mouth daily.  . metFORMIN (GLUCOPHAGE-XR) 750 MG 24 hr tablet Take 1 Tablet by mouth once daily with breakfast  . mirabegron ER (MYRBETRIQ) 25 MG TB24 tablet Take 1 tablet (25 mg total) by mouth daily.  . simvastatin (ZOCOR) 80 MG tablet Take 1 tablet (80 mg total) by mouth daily.  . SURE COMFORT PEN NEEDLES 31G X 8 MM MISC USE TO inject lantus ONCE daily  . Continuous Blood Gluc Receiver (FREESTYLE LIBRE 14 DAY READER) DEVI 1 each by Does not apply route daily. (Patient not taking: Reported on 01/09/2019)  . Continuous Blood Gluc Sensor (FREESTYLE LIBRE 14 DAY SENSOR) MISC 1 each by Does not apply route every 14 (fourteen) days. (Patient not taking: Reported on 01/09/2019)   No facility-administered encounter medications on file as of 01/09/2019.     Allergies (verified) Lipitor [atorvastatin] and Niaspan [niacin er]   History: Past Medical History:  Diagnosis Date  . Anxiety   . Diabetes mellitus without complication ([place]   . Hyperlipidemia   . Hypertension   . Stroke ([facility]    Past Surgical  History:  Procedure Laterality Date  . CAROTID ENDARTERECTOMY Right   . CAROTID STENT     Family History  Problem Relation Age of Onset  . Heart disease Father   . Heart attack Father   . Hypertension Father   . Diabetes Sister   . Stroke Sister   . Diabetes Brother   . Diabetes Sister   . Diabetes Sister   . Diabetes Brother   . Diabetes Brother   . Diabetes Brother    Social History   Socioeconomic History  . Marital status: Married    Spouse name: Not on file  . Number of  children: 1  . Years of education: Not on file  . Highest education level: 10th grade  Occupational History  . Occupation: retired    Fish farm manager: [employer]  Social Needs  . Financial resource strain: Not hard at all  . Food insecurity:    Worry: Never true    Inability: Never true  . Transportation needs:    Medical: No    Non-medical: No  Tobacco Use  . Smoking status: Former Smoker    Last attempt to quit: 12/02/2009    Years since quitting: 9.1  . Smokeless tobacco: Former Systems developer    Quit date: 01/27/2011  Substance and Sexual Activity  . Alcohol use: No  . Drug use: No  . Sexual activity: Yes  Lifestyle  . Physical activity:    Days per week: 0 days    Minutes per session: 0 min  . Stress: Only a little  Relationships  . Social connections:    Talks on phone: More than three times a week    Gets together: More than three times a week    Attends religious service: Never    Active member of club or organization: No    Attends meetings of clubs or organizations: Never    Relationship status: Married  Other Topics Concern  . Not on file  Social History Narrative  . Not on file     Clinical Intake:     Pain Score: 0-No pain                  Activities of Daily Living In your present state of health, do you have any difficulty performing the following activities: 01/09/2019  Hearing? N  Vision? N  Difficulty concentrating or making decisions? N  Walking or climbing stairs? Y  Comment Due to balance issues at times  Dressing or bathing? N  Doing errands, shopping? N  Preparing Food and eating ? N  Using the Toilet? N  In the past six months, have you accidently leaked urine? Y  Comment With coughing or sneezing  Do you have problems with loss of bowel control? N  Managing your Medications? N  Managing your Finances? N  Housekeeping or managing your Housekeeping? N  Some recent data might be hidden     Exercise Current Exercise Habits: The patient  does not participate in regular exercise at present  Diet Consumes 2 meals a day and 1 snacks a day.  The patient feels that she mostly follow a Diabetic diet.  Diet History  Mrs. Saline states she tries to follow a diabetic friendly diet, however that is difficult for her at times as she likes to snack.  She states she usually does not eat breakast because she does not have an appetite for it in the mornings.  Usually has vegetables (pinto beans, corn, green beans) and  corn bread for lunch, and grilled chicken sandwich and vegetables for supper.  Recommended diet of mostly non-starchy vegetables and lean proteins and whole grains and fruits in moderation  Has a snack before bed- usually  Pop corn or chips.     Depression Screen PHQ 2/9 Scores 01/09/2019 10/02/2018 06/27/2018 03/06/2018 12/06/2017 08/22/2017 07/19/2017  PHQ - 2 Score _0 0 _1 PHQ- 9 Score _2 - _3 Fall Risk Fall Risk  01/09/2019 10/02/2018 03/06/2018 12/06/2017 08/22/2017  Falls in the past year? 1 0 No No No  Number falls in past yr: 0 - - - -  Injury with Fall? 1 - - - -  Risk for fall due to : Impaired balance/gait - - - -  Follow up Education provided;Falls prevention discussed - - - -     Objective:    Today's Vitals   01/09/19 1414  BP: 108/84  Pulse: 98  Weight: 165 lb (74.8 kg)  Height: 5' 1" (1.549 m)  PainSc: 0-No pain   Body mass index is 31.18 kg/m.  Advanced Directives 01/09/2019 12/29/2016 12/05/2015 05/08/2015 09/06/2014  Does Patient Have a Medical Advance Directive? No No No No Yes;No  Would patient like information on creating a medical advance directive? Yes (MAU/Ambulatory/Procedural Areas - Information given) Yes (MAU/Ambulatory/Procedural Areas - Information given) No - patient declined information Yes - Educational materials given Yes - Educational materials given    Hearing/Vision  No hearing or vision deficits noted during visit.  Patient is evaluated yearly for eye exam at  [facility].   Cognitive Function: MMSE - Mini Mental State Exam 01/09/2019 12/29/2016 12/05/2015  Orientation to time _4 Orientation to Place _5 Registration _6 Attention/ Calculation _7 Recall 2 0 1  Language- name 2 objects _8 Language- repeat _9 Language- follow 3 step command _10 Language- read & follow direction _11 Write a sentence _12 Copy design _13 Total score _14 Immunizations and Health Maintenance Immunization History  Administered Date(s) Administered  . Influenza, High Dose Seasonal PF 08/22/2017, 10/02/2018  . Influenza,inj,Quad PF,6+ Mos 10/19/2013, 09/06/2014, 08/14/2015, 09/02/2016  . Pneumococcal Conjugate-13 08/14/2015  . Pneumococcal Polysaccharide-23 07/14/2012, 01/09/2019   Shingrix declined today  Health Maintenance Due  Topic Date Due  . Fecal DNA (Cologuard)  04/18/1998      Health Maintenance  Topic Date Due  . Fecal DNA (Cologuard)  04/18/1998  . MAMMOGRAM  03/07/2019 (Originally 04/18/1966)  . DEXA SCAN  03/07/2019 (Originally 08/11/2017)  . HEMOGLOBIN A1C  04/03/2019  . OPHTHALMOLOGY EXAM  04/21/2019  . FOOT EXAM  06/28/2019  . TETANUS/TDAP  08/01/2021  . INFLUENZA VACCINE  Completed  . Hepatitis C Screening  Completed  . PNA vac Low Risk Adult  Completed   Cologuard reordered today Mammogram scheduled for 02/16/2019. Dexa scan done today.          Assessment:   This is a routine wellness examination for Bianey.    Plan:    Goals    . Exercise 150 min/wk Moderate Activity     Walking is a great option.        Health Maintenance & Additional Screening Recommendations  Screening mammography Bone densitometry screening  Advanced directives: has NO advanced directive  - add't info requested. Referral to SW: no  Lung: Low Dose CT Chest recommended if Age 46-80 years, 30 pack-year currently smoking OR have quit w/in 15years. Patient does not  qualify. Hepatitis C Screening recommended: Completed 12/05/2015   Today's Orders Orders Placed This Encounter  Procedures  . DG [facility] DEXA    Order Specific Question:   Reason for Exam (SYMPTOM  OR DIAGNOSIS REQUIRED)    Answer:   post menopausal  . Pneumococcal polysaccharide vaccine 23-valent greater than or equal to 2yo subcutaneous/IM   Cologuard  Keep f/u with Kylie Pretty, FNP and any other specialty appointments you may have Continue current medications Move carefully to avoid falls.  Aim for at least 150 minutes of moderate activity a week. This can be done with chair exercises if necessary. Read or work on puzzles daily Stay connected with friends and family  I have personally reviewed and noted the following in the patient's chart:   . Medical and social history . Use of alcohol, tobacco or illicit drugs  . Current medications and supplements . Functional ability and status . Nutritional status . Physical activity . Advanced directives . List of other physicians . Hospitalizations, surgeries, and ER visits in previous 12 months . Vitals . Screenings to include cognitive, depression, and falls . Referrals and appointments  In addition, I have reviewed and discussed with patient certain preventive protocols, quality metrics, and best practice recommendations. A written personalized care plan for preventive services as well as general preventive health recommendations were provided to patient.     Nolberto Hanlon, RN  01/09/2019   I have reviewed and agree with the above AWV documentation.   Mary-Margaret Hassell Done, FNP

## 2019-01-15 ENCOUNTER — Encounter: Payer: Self-pay | Admitting: Nurse Practitioner

## 2019-01-15 ENCOUNTER — Other Ambulatory Visit: Payer: Self-pay

## 2019-01-15 ENCOUNTER — Ambulatory Visit (INDEPENDENT_AMBULATORY_CARE_PROVIDER_SITE_OTHER): Payer: Medicare Other | Admitting: Nurse Practitioner

## 2019-01-15 VITALS — BP 192/95 | HR 99 | Temp 98.1°F | Ht 61.0 in | Wt 166.0 lb

## 2019-01-15 DIAGNOSIS — E1142 Type 2 diabetes mellitus with diabetic polyneuropathy: Secondary | ICD-10-CM

## 2019-01-15 DIAGNOSIS — R609 Edema, unspecified: Secondary | ICD-10-CM

## 2019-01-15 DIAGNOSIS — I6930 Unspecified sequelae of cerebral infarction: Secondary | ICD-10-CM | POA: Diagnosis not present

## 2019-01-15 DIAGNOSIS — F411 Generalized anxiety disorder: Secondary | ICD-10-CM | POA: Diagnosis not present

## 2019-01-15 DIAGNOSIS — E785 Hyperlipidemia, unspecified: Secondary | ICD-10-CM

## 2019-01-15 DIAGNOSIS — R600 Localized edema: Secondary | ICD-10-CM

## 2019-01-15 DIAGNOSIS — Z6831 Body mass index (BMI) 31.0-31.9, adult: Secondary | ICD-10-CM

## 2019-01-15 DIAGNOSIS — I10 Essential (primary) hypertension: Secondary | ICD-10-CM | POA: Diagnosis not present

## 2019-01-15 DIAGNOSIS — Z794 Long term (current) use of insulin: Secondary | ICD-10-CM | POA: Diagnosis not present

## 2019-01-15 LAB — BAYER DCA HB A1C WAIVED: HB A1C: 8.4 % — AB (ref <7.0)

## 2019-01-15 MED ORDER — FUROSEMIDE 40 MG PO TABS: 40.0000 mg | ORAL_TABLET | Freq: Every day | ORAL | 1 refills | Status: DC

## 2019-01-15 MED ORDER — ALPRAZOLAM 1 MG PO TABS: 1.0000 mg | ORAL_TABLET | Freq: Two times a day (BID) | ORAL | 2 refills | Status: DC

## 2019-01-15 MED ORDER — ALENDRONATE SODIUM 70 MG PO TABS: 70.0000 mg | ORAL_TABLET | ORAL | 3 refills | Status: DC

## 2019-01-15 MED ORDER — FENOFIBRATE 160 MG PO TABS: 160.0000 mg | ORAL_TABLET | Freq: Every day | ORAL | 1 refills | Status: DC

## 2019-01-15 MED ORDER — METFORMIN HCL ER 750 MG PO TB24: ORAL_TABLET | ORAL | 1 refills | Status: DC

## 2019-01-15 MED ORDER — GABAPENTIN 300 MG PO CAPS: 300.0000 mg | ORAL_CAPSULE | Freq: Every day | ORAL | 1 refills | Status: DC

## 2019-01-15 MED ORDER — INSULIN GLARGINE 100 UNIT/ML ~~LOC~~ SOLN: SUBCUTANEOUS | 6 refills | Status: DC

## 2019-01-15 MED ORDER — LIRAGLUTIDE 18 MG/3ML ~~LOC~~ SOPN: PEN_INJECTOR | SUBCUTANEOUS | 3 refills | Status: DC

## 2019-01-15 MED ORDER — INSULIN LISPRO 100 UNIT/ML ~~LOC~~ SOLN: SUBCUTANEOUS | 11 refills | Status: DC

## 2019-01-15 MED ORDER — SIMVASTATIN 80 MG PO TABS: 80.0000 mg | ORAL_TABLET | Freq: Every day | ORAL | 1 refills | Status: DC

## 2019-01-15 MED ORDER — LISINOPRIL 20 MG PO TABS: 20.0000 mg | ORAL_TABLET | Freq: Every day | ORAL | 1 refills | Status: DC

## 2019-01-15 NOTE — Progress Notes (Signed)
Subjective:    Patient ID: Kylie Ward, female    DOB: 1946/10/01, 73 y.o.   MRN: [id number]   Chief Complaint: Medical Management of Chronic Issues (Discuss dexa results)   HPI:  1. Type 2 diabetes mellitus with diabetic polyneuropathy, with long-term current use of insulin (HCC) last Hgba1c was 8.3%. lantus was increased to 80u at bedtime. Her fasting blood sugars have been running around 130. No hypoglycemia. She is on a sliding scale and does not always do at lunch. She is not sure how much lantus she has been taking.  2. Essential hypertension, benign  No c/o chest pain, sob or headache. Does not check blood pressure at home.  3. Hyperlipidemia with target LDL less than 100  Does not watch diet and does no exercise  4. GAD (generalized anxiety disorder)  xanax43m bid  5. Late effect of cerebrovascular accident (CVA)  No residual effects  6. Peripheral edema  Will resolve if elevates legs  7. BMI 31.0-31.9,adult  Not weight changes    Outpatient Encounter Medications as of 01/15/2019  Medication Sig  . ACCU-CHEK FASTCLIX LANCETS MISC USE TO check sugar THREE times daily  . ALPRAZolam (XANAX) 1 MG tablet Take 1 tablet (1 mg total) by mouth 2 (two) times daily.  .Marland Kitchenaspirin 81 MG tablet Take 81 mg by mouth daily.  . Blood Glucose Monitoring Suppl (ACCU-CHEK NANO SMARTVIEW) w/Device KIT CHECK BLOOD SUGER UP TO 3 TIMES A DAY  . Elastic Bandages & Supports (V-2 HIGH COMPRESSION HOSE) MISC 1 each by Does not apply route daily.  . fenofibrate 160 MG tablet Take 1 tablet (160 mg total) by mouth daily.  . furosemide (LASIX) 40 MG tablet Take 1 tablet (40 mg total) by mouth daily.  .Marland Kitchengabapentin (NEURONTIN) 300 MG capsule Take 1 capsule (300 mg total) by mouth at bedtime.  .Marland Kitchenglucose blood (ACCU-CHEK SMARTVIEW) test strip USE TO check sugar THREE times daily  . insulin glargine (LANTUS) 100 UNIT/ML injection INJECT 80 UNITS AT BEDTIME  . insulin lispro (HUMALOG) 100 UNIT/ML  injection Sliding scale up to 12u TID  . liraglutide (VICTOZA) 18 MG/3ML SOPN 1.8 daily  . lisinopril (PRINIVIL,ZESTRIL) 20 MG tablet Take 1 tablet (20 mg total) by mouth daily.  . metFORMIN (GLUCOPHAGE-XR) 750 MG 24 hr tablet Take 1 Tablet by mouth once daily with breakfast  . mirabegron ER (MYRBETRIQ) 25 MG TB24 tablet Take 1 tablet (25 mg total) by mouth daily.  . simvastatin (ZOCOR) 80 MG tablet Take 1 tablet (80 mg total) by mouth daily.  . SURE COMFORT PEN NEEDLES 31G X 8 MM MISC USE TO inject lantus ONCE daily  . Continuous Blood Gluc Receiver (FREESTYLE LIBRE 14 DAY READER) DEVI 1 each by Does not apply route daily. (Patient not taking: Reported on 01/15/2019)  . Continuous Blood Gluc Sensor (FREESTYLE LIBRE 14 DAY SENSOR) MISC 1 each by Does not apply route every 14 (fourteen) days. (Patient not taking: Reported on 01/15/2019)      New complaints: Had bone density test a couple of weeks agia nd was dx with osteoporosis. Denies any backpain  Social history: Lives with  husband   Review of Systems  Constitutional: Negative for activity change and appetite change.  HENT: Negative.   Eyes: Negative for pain.  Respiratory: Negative for shortness of breath.   Cardiovascular: Negative for chest pain, palpitations and leg swelling.  Gastrointestinal: Negative for abdominal pain.  Endocrine: Negative for polydipsia.  Genitourinary: Negative.   Skin:  Negative for rash.  Neurological: Negative for dizziness, weakness and headaches.  Hematological: Does not bruise/bleed easily.  Psychiatric/Behavioral: Negative.   All other systems reviewed and are negative.      Objective:   Physical Exam Vitals signs and nursing note reviewed.  Constitutional:      General: She is not in acute distress.    Appearance: Normal appearance. She is well-developed.  HENT:     Head: Normocephalic.     Nose: Nose normal.  Eyes:     Pupils: Pupils are equal, round, and reactive to light.  Neck:      Musculoskeletal: Normal range of motion and neck supple.     Vascular: No carotid bruit or JVD.  Cardiovascular:     Rate and Rhythm: Normal rate and regular rhythm.     Heart sounds: Normal heart sounds.  Pulmonary:     Effort: Pulmonary effort is normal. No respiratory distress.     Breath sounds: Normal breath sounds. No wheezing or rales.  Chest:     Chest wall: No tenderness.  Abdominal:     General: Bowel sounds are normal. There is no distension or abdominal bruit.     Palpations: Abdomen is soft. There is no hepatomegaly, splenomegaly, mass or pulsatile mass.     Tenderness: There is no abdominal tenderness.  Musculoskeletal: Normal range of motion.     Comments: No weakness  Lymphadenopathy:     Cervical: No cervical adenopathy.  Skin:    General: Skin is warm and dry.  Neurological:     Mental Status: She is alert and oriented to person, place, and time.     Deep Tendon Reflexes: Reflexes are normal and symmetric.  Psychiatric:        Behavior: Behavior normal.        Thought Content: Thought content normal.        Judgment: Judgment normal.    BP (!) 192/95   Pulse 99   Temp 98.1 F (36.7 C) (Oral)   Ht 5' 1" (1.549 m)   Wt 166 lb (75.3 kg)   LMP 01/27/1983   BMI 31.37 kg/m        Assessment & Plan:  JONALYN SEDLAK comes in today with chief complaint of Medical Management of Chronic Issues (Discuss dexa results)   Diagnosis and orders addressed:  1. Type 2 diabetes mellitus with diabetic polyneuropathy, with long-term current use of insulin (HCC) Stricter carb counting Make sure take lantus 80u at bedtime' sliding scale 3x a day Keep diary of blood sugars and write how much humalog taking with meals. - Bayer DCA Hb A1c Waived - Microalbumin / creatinine urine ratio - insulin glargine (LANTUS) 100 UNIT/ML injection; INJECT 80 UNITS AT BEDTIME  Dispense: 20 mL; Refill: 6 - gabapentin (NEURONTIN) 300 MG capsule; Take 1 capsule (300 mg total) by  mouth at bedtime.  Dispense: 90 capsule; Refill: 1 - metFORMIN (GLUCOPHAGE-XR) 750 MG 24 hr tablet; Take 1 Tablet by mouth once daily with breakfast  Dispense: 90 tablet; Refill: 1 - liraglutide (VICTOZA) 18 MG/3ML SOPN; 1.8 daily  Dispense: 3 pen; Refill: 3 - insulin lispro (HUMALOG) 100 UNIT/ML injection; Sliding scale up to 12u TID  Dispense: 10 mL; Refill: 11  2. Essential hypertension, benign Low sodium diet - CMP14+EGFR - lisinopril (PRINIVIL,ZESTRIL) 20 MG tablet; Take 1 tablet (20 mg total) by mouth daily.  Dispense: 90 tablet; Refill: 1  3. Hyperlipidemia with target LDL less than 100 Low fat diet - Lipid  panel - simvastatin (ZOCOR) 80 MG tablet; Take 1 tablet (80 mg total) by mouth daily.  Dispense: 90 tablet; Refill: 1 - fenofibrate 160 MG tablet; Take 1 tablet (160 mg total) by mouth daily.  Dispense: 90 tablet; Refill: 1  4. GAD (generalized anxiety disorder) Stress management - ALPRAZolam (XANAX) 1 MG tablet; Take 1 tablet (1 mg total) by mouth 2 (two) times daily.  Dispense: 60 tablet; Refill: 2  5. Late effect of cerebrovascular accident (CVA)  6. Peripheral edema Elevate legs when sitting - furosemide (LASIX) 40 MG tablet; Take 1 tablet (40 mg total) by mouth daily.  Dispense: 90 tablet; Refill: 1  7. BMI 31.0-31.9,adult Discussed diet and exercise for person with BMI >25 Will recheck weight in 3-6 months    Labs pending Health Maintenance reviewed Diet and exercise encouraged  Follow up plan: 1 month   [place], FNP

## 2019-01-15 NOTE — Patient Instructions (Signed)
DIABETIC SLIDING SCALE (II)  IF BLOOD SUGAR IS:  LESS THAN 100 (NO INSULIN)  100-140 (3 UNITS)  141-180 (6 UNITS)  181-220 (9 UNITS)  221-260 (12 UNITS)  261-300 (15 UNITS)  301-340 (18 UNITS)  MORE THAN 341 (21 UNITS) 

## 2019-01-16 ENCOUNTER — Ambulatory Visit: Payer: Medicare Other | Admitting: Nurse Practitioner

## 2019-01-16 LAB — CMP14+EGFR
ALT: 26 IU/L (ref 0–32)
AST: 18 IU/L (ref 0–40)
Albumin/Globulin Ratio: 2 (ref 1.2–2.2)
Albumin: 4.3 g/dL (ref 3.8–4.8)
Alkaline Phosphatase: 88 IU/L (ref 39–117)
BUN/Creatinine Ratio: 22 (ref 12–28)
BUN: 17 mg/dL (ref 8–27)
Bilirubin Total: 0.2 mg/dL (ref 0.0–1.2)
CO2: 24 mmol/L (ref 20–29)
Calcium: 9.7 mg/dL (ref 8.7–10.3)
Chloride: 102 mmol/L (ref 96–106)
Creatinine, Ser: 0.76 mg/dL (ref 0.57–1.00)
GFR calc Af Amer: 92 mL/min/{1.73_m2} (ref >59)
GFR, EST NON AFRICAN AMERICAN: 80 mL/min/{1.73_m2} (ref >59)
Globulin, Total: 2.2 g/dL (ref 1.5–4.5)
Glucose: 126 mg/dL — ABNORMAL HIGH (ref 65–99)
POTASSIUM: 5 mmol/L (ref 3.5–5.2)
Sodium: 141 mmol/L (ref 134–144)
Total Protein: 6.5 g/dL (ref 6.0–8.5)

## 2019-01-16 LAB — LIPID PANEL
Chol/HDL Ratio: 5 ratio — ABNORMAL HIGH (ref 0.0–4.4)
Cholesterol, Total: 207 mg/dL — ABNORMAL HIGH (ref 100–199)
HDL: 41 mg/dL (ref >39)
Triglycerides: 454 mg/dL — ABNORMAL HIGH (ref 0–149)

## 2019-01-24 DIAGNOSIS — Z1212 Encounter for screening for malignant neoplasm of rectum: Secondary | ICD-10-CM | POA: Diagnosis not present

## 2019-01-24 DIAGNOSIS — Z1211 Encounter for screening for malignant neoplasm of colon: Secondary | ICD-10-CM | POA: Diagnosis not present

## 2019-01-31 ENCOUNTER — Other Ambulatory Visit: Payer: Self-pay

## 2019-01-31 ENCOUNTER — Telehealth (INDEPENDENT_AMBULATORY_CARE_PROVIDER_SITE_OTHER): Payer: Medicare Other | Admitting: Pharmacist Clinician (PhC)/ Clinical Pharmacy Specialist

## 2019-01-31 DIAGNOSIS — M810 Age-related osteoporosis without current pathological fracture: Secondary | ICD-10-CM

## 2019-01-31 LAB — COLOGUARD: Cologuard: POSITIVE — AB

## 2019-01-31 NOTE — Progress Notes (Signed)
   Virtual Visit via telephone Note  I connected with Kylie Ward on 01/31/19 at 10:05am by telephone and verified that I am speaking with the correct person using two identifiers. Kylie Ward is currently located at home and husband is currently with her during visit. The provider, Chari Manning, PharmD is located in their office at time of visit.  I discussed the limitations, risks, security and privacy concerns of performing an evaluation and management service by telephone and the availability of in person appointments. I also discussed with the patient that there may be a patient responsible charge related to this service. The patient expressed understanding and agreed to proceed.   History and Present Illness:  Patient has had several dexascans in the past one in 2013 that showed osteopenia and one in 2020 that showed osteopenia in her spine and osteoporosis in her hip (T score -2.9).  Patient states she has not had any past treatments for osteoporosis and does not suffer from GERD.  She also has no history of osteonecrosis of her jaw or dental problems.  She will get x-rays and inform her dentist she is taking alendronate once restrictions are listed from COVID-19.  Assessment and Plan: Osteoporosis based on Dexascan hip T-Score of -2.9:  Patient started on fosamax weekly on 3/16 but had not started it because she did not know what it was for.  Counseled patient on fall prevention, how to take fosamax correctly and therapeutic benefits and side effects (call us if she has GERD).  She is already taking sufficient calcium and vitamin D supplementation.  Follow Up Instructions: Follow up dexascan in 2 years    I discussed the assessment and treatment plan with the patient. The patient was provided an opportunity to ask questions and all were answered. The patient agreed with the plan and demonstrated an understanding of the instructions.   The patient was advised to call back  or seek an in-person evaluation if the symptoms worsen or if the condition fails to improve as anticipated.  The above assessment and management plan was discussed with the patient. The patient verbalized understanding of and has agreed to the management plan. Patient is aware to call the clinic if symptoms persist or worsen. Patient is aware when to return to the clinic for a follow-up visit. Patient educated on when it is appropriate to go to the emergency department.    I provided 20 minutes of non-face-to-face time during this encounter.    Chari Manning, PharmD

## 2019-02-02 NOTE — Addendum Note (Signed)
Addended by: Bennie Pierini on: 02/02/2019 11:38 AM   Modules accepted: Orders

## 2019-02-08 DIAGNOSIS — R195 Other fecal abnormalities: Secondary | ICD-10-CM | POA: Diagnosis not present

## 2019-02-16 ENCOUNTER — Other Ambulatory Visit: Payer: Self-pay

## 2019-02-16 ENCOUNTER — Encounter: Payer: Self-pay | Admitting: Nurse Practitioner

## 2019-02-16 ENCOUNTER — Ambulatory Visit (INDEPENDENT_AMBULATORY_CARE_PROVIDER_SITE_OTHER): Payer: Medicare Other | Admitting: Nurse Practitioner

## 2019-02-16 VITALS — BP 116/80 | HR 96 | Temp 97.9°F | Ht 61.0 in | Wt 166.0 lb

## 2019-02-16 DIAGNOSIS — Z794 Long term (current) use of insulin: Secondary | ICD-10-CM

## 2019-02-16 DIAGNOSIS — E1142 Type 2 diabetes mellitus with diabetic polyneuropathy: Secondary | ICD-10-CM | POA: Diagnosis not present

## 2019-02-16 LAB — BAYER DCA HB A1C WAIVED: HB A1C (BAYER DCA - WAIVED): 8.2 % — ABNORMAL HIGH (ref <7.0)

## 2019-02-16 NOTE — Progress Notes (Signed)
   Subjective:    Patient ID: Kylie Ward, female    DOB: 1946/04/02, 73 y.o.   MRN: [id number]   Chief Complaint: Diabetes   HPI Patient was seen on 01/15/19. Her HGBA1c was 8.4%. she did not make any of the changes I had discussed wit her previously. We discussed her diet and the need increase her lantus to 80u at bedtime as well as make sure she does her sliding scale 3x a day. She was also asked to bring a diary of her blood sugars to this appointment today.patient has still only been taking lantus 65-70u. Blood sugars are still running high.   Review of Systems  Constitutional: Negative for activity change and appetite change.  HENT: Negative.   Eyes: Negative for pain.  Respiratory: Negative for shortness of breath.   Cardiovascular: Negative for chest pain, palpitations and leg swelling.  Gastrointestinal: Negative for abdominal pain.  Endocrine: Negative for polydipsia.  Genitourinary: Negative.   Skin: Negative for rash.  Neurological: Negative for dizziness, weakness and headaches.  Hematological: Does not bruise/bleed easily.  Psychiatric/Behavioral: Negative.   All other systems reviewed and are negative.      Objective:   Physical Exam Constitutional:      Appearance: Normal appearance.  Cardiovascular:     Rate and Rhythm: Normal rate and regular rhythm.     Pulses: Normal pulses.     Heart sounds: Normal heart sounds.  Skin:    General: Skin is warm and dry.  Neurological:     General: No focal deficit present.     Mental Status: She is alert and oriented to person, place, and time.  Psychiatric:        Mood and Affect: Mood normal.        Behavior: Behavior normal.    BP 116/80   Pulse 96   Temp 97.9 F (36.6 C) (Oral)   Ht 5\' 1"  (1.549 m)   Wt 166 lb (75.3 kg)   LMP 01/27/1983   BMI 31.37 kg/m   HGBA1c 8.2%     Assessment & Plan:  Kylie Ward in today with chief complaint of Diabetes   1. Type 2 diabetes mellitus with  diabetic polyneuropathy, with long-term current use of insulin (HCC) lantus 75u Qhs. Continue sliding scale 3x a day Strict low carb diet - Bayer DCA Hb A1c Waived  [name], FNP

## 2019-03-02 ENCOUNTER — Ambulatory Visit: Payer: Medicare Other | Admitting: Nurse Practitioner

## 2019-03-07 ENCOUNTER — Other Ambulatory Visit: Payer: Self-pay | Admitting: Nurse Practitioner

## 2019-04-11 ENCOUNTER — Other Ambulatory Visit: Payer: Self-pay | Admitting: Nurse Practitioner

## 2019-04-11 DIAGNOSIS — Z794 Long term (current) use of insulin: Secondary | ICD-10-CM

## 2019-04-11 DIAGNOSIS — E1142 Type 2 diabetes mellitus with diabetic polyneuropathy: Secondary | ICD-10-CM

## 2019-04-11 MED ORDER — LIRAGLUTIDE 18 MG/3ML ~~LOC~~ SOPN: 1.8000 mg | PEN_INJECTOR | Freq: Every day | SUBCUTANEOUS | 0 refills | Status: DC

## 2019-04-11 MED ORDER — INSULIN GLARGINE 100 UNIT/ML ~~LOC~~ SOLN: 80.0000 [IU] | Freq: Every day | SUBCUTANEOUS | 0 refills | Status: DC

## 2019-04-11 NOTE — Telephone Encounter (Signed)
What is the name of the medication? Liraglutide 18 mg/3ML SOPN, Insulin Glargine (Lantus) 100 unit/ml  Have you contacted your pharmacy to request a refill? Yes  Which pharmacy would you like this sent to? [facility] [phone] - Phone #   Patient notified that their request is being sent to the clinical staff for review and that they should receive a call once it is complete. If they do not receive a call within 24 hours they can check with their pharmacy or our office.

## 2019-04-11 NOTE — Telephone Encounter (Signed)
Pt aware refills sent to mail order

## 2019-04-27 ENCOUNTER — Ambulatory Visit: Payer: Medicare Other | Admitting: Nurse Practitioner

## 2019-05-14 ENCOUNTER — Encounter: Payer: Self-pay | Admitting: Nurse Practitioner

## 2019-05-14 ENCOUNTER — Ambulatory Visit (INDEPENDENT_AMBULATORY_CARE_PROVIDER_SITE_OTHER): Payer: Medicare Other | Admitting: Nurse Practitioner

## 2019-05-14 DIAGNOSIS — I6930 Unspecified sequelae of cerebral infarction: Secondary | ICD-10-CM

## 2019-05-14 DIAGNOSIS — F411 Generalized anxiety disorder: Secondary | ICD-10-CM | POA: Diagnosis not present

## 2019-05-14 DIAGNOSIS — R609 Edema, unspecified: Secondary | ICD-10-CM

## 2019-05-14 DIAGNOSIS — E785 Hyperlipidemia, unspecified: Secondary | ICD-10-CM

## 2019-05-14 DIAGNOSIS — E1142 Type 2 diabetes mellitus with diabetic polyneuropathy: Secondary | ICD-10-CM

## 2019-05-14 DIAGNOSIS — I10 Essential (primary) hypertension: Secondary | ICD-10-CM

## 2019-05-14 DIAGNOSIS — R3915 Urgency of urination: Secondary | ICD-10-CM

## 2019-05-14 DIAGNOSIS — Z6831 Body mass index (BMI) 31.0-31.9, adult: Secondary | ICD-10-CM

## 2019-05-14 DIAGNOSIS — Z794 Long term (current) use of insulin: Secondary | ICD-10-CM

## 2019-05-14 MED ORDER — INSULIN GLARGINE 100 UNIT/ML ~~LOC~~ SOLN: 70.0000 [IU] | Freq: Every day | SUBCUTANEOUS | 5 refills | Status: DC

## 2019-05-14 MED ORDER — GABAPENTIN 300 MG PO CAPS: 300.0000 mg | ORAL_CAPSULE | Freq: Every day | ORAL | 1 refills | Status: DC

## 2019-05-14 MED ORDER — METFORMIN HCL ER 750 MG PO TB24: ORAL_TABLET | ORAL | 1 refills | Status: DC

## 2019-05-14 MED ORDER — SIMVASTATIN 80 MG PO TABS: 80.0000 mg | ORAL_TABLET | Freq: Every day | ORAL | 1 refills | Status: DC

## 2019-05-14 MED ORDER — VICTOZA 18 MG/3ML ~~LOC~~ SOPN: 1.8000 mg | PEN_INJECTOR | Freq: Every day | SUBCUTANEOUS | 5 refills | Status: DC

## 2019-05-14 MED ORDER — FUROSEMIDE 40 MG PO TABS: 40.0000 mg | ORAL_TABLET | Freq: Every day | ORAL | 1 refills | Status: DC

## 2019-05-14 MED ORDER — MIRABEGRON ER 25 MG PO TB24: 25.0000 mg | ORAL_TABLET | Freq: Every day | ORAL | 5 refills | Status: DC

## 2019-05-14 MED ORDER — INSULIN LISPRO 100 UNIT/ML ~~LOC~~ SOLN: SUBCUTANEOUS | 11 refills | Status: DC

## 2019-05-14 MED ORDER — LISINOPRIL 20 MG PO TABS: 20.0000 mg | ORAL_TABLET | Freq: Every day | ORAL | 1 refills | Status: DC

## 2019-05-14 MED ORDER — FENOFIBRATE 160 MG PO TABS: 160.0000 mg | ORAL_TABLET | Freq: Every day | ORAL | 1 refills | Status: DC

## 2019-05-14 MED ORDER — ALPRAZOLAM 1 MG PO TABS: 1.0000 mg | ORAL_TABLET | Freq: Two times a day (BID) | ORAL | 2 refills | Status: DC

## 2019-05-14 NOTE — Progress Notes (Signed)
Patient ID: Kylie Ward, female   DOB: 16-Oct-1946, 73 y.o.   MRN: [id number]    Virtual Visit via telephone Note  I connected with Kylie Ward on 05/14/19 at 12:45 by telephone and verified that I am speaking with the correct person using two identifiers. Kylie Ward is currently located at home and no one is currently with her during visit. The provider, Mary-Margaret Hassell Done, FNP is located in their office at time of visit.  I discussed the limitations, risks, security and privacy concerns of performing an evaluation and management service by telephone and the availability of in person appointments. I also discussed with the patient that there may be a patient responsible charge related to this service. The patient expressed understanding and agreed to proceed.   History and Present Illness:   Chief Complaint: Medical Management of Chronic Issues    HPI:  1. Essential hypertension, benign No c/o chest pain, sob or headache. Does not check blood pressure at home. BP Readings from Last 3 Encounters:  02/16/19 116/80  01/15/19 (!) 192/95  01/09/19 108/84     2. Type 2 diabetes mellitus with diabetic polyneuropathy, with long-term current use of insulin (HCC) Is not very compliant with diet. Her last HGBA1c was 8.2%. we increased lantus to 75u daily but she has only been taking 70u. Encouraged to do sliding scale 3x a day. Her blood sugars have been all over the place. Some readings as high as 170-175.  3. Hyperlipidemia with target LDL less than 100 Does not watch diet and does very little exercise.  4. GAD (generalized anxiety disorder) Stays stressed. Has been this way her entire life. She has been on xanax 1m bid for many years. She says it helps er relax.  5. Peripheral edema Has daily edema- worse in evenings- resolves some at night.  6. Late effect of cerebrovascular accident (CVA) Has no residual effects from stroke that she is aware of.  7. BMI  31.0-31.9,adult No recent weight changes    Outpatient Encounter Medications as of 05/14/2019  Medication Sig  . ACCU-CHEK FASTCLIX LANCETS MISC USE TO check sugar THREE times daily  . alendronate (FOSAMAX) 70 MG tablet Take 1 tablet (70 mg total) by mouth once a week. Take with a full glass of water on an empty stomach.  . ALPRAZolam (XANAX) 1 MG tablet Take 1 tablet (1 mg total) by mouth 2 (two) times daily.  .Marland Kitchenaspirin 81 MG tablet Take 81 mg by mouth daily.  . Blood Glucose Monitoring Suppl (ACCU-CHEK NANO SMARTVIEW) w/Device KIT CHECK BLOOD SUGER UP TO 3 TIMES A DAY  . Continuous Blood Gluc Receiver (FREESTYLE LIBRE 14 DAY READER) DEVI 1 each by Does not apply route daily. (Patient not taking: Reported on 02/16/2019)  . Continuous Blood Gluc Sensor (FREESTYLE LIBRE 14 DAY SENSOR) MISC 1 each by Does not apply route every 14 (fourteen) days. (Patient not taking: Reported on 02/16/2019)  . Elastic Bandages & Supports (V-2 HIGH COMPRESSION HOSE) MISC 1 each by Does not apply route daily.  . fenofibrate 160 MG tablet Take 1 tablet (160 mg total) by mouth daily.  . furosemide (LASIX) 40 MG tablet Take 1 tablet (40 mg total) by mouth daily.  .Marland Kitchengabapentin (NEURONTIN) 300 MG capsule Take 1 capsule (300 mg total) by mouth at bedtime.  .Marland Kitchenglucose blood (ACCU-CHEK SMARTVIEW) test strip Check blood sugar three times daily Dx E11.9  . insulin glargine (LANTUS) 100 UNIT/ML injection Inject 0.8 mLs (80 Units  total) into the skin at bedtime.  . insulin lispro (HUMALOG) 100 UNIT/ML injection Sliding scale up to 12u TID  . liraglutide (VICTOZA) 18 MG/3ML SOPN Inject 0.3 mLs (1.8 mg total) into the skin daily.  Marland Kitchen lisinopril (PRINIVIL,ZESTRIL) 20 MG tablet Take 1 tablet (20 mg total) by mouth daily.  . metFORMIN (GLUCOPHAGE-XR) 750 MG 24 hr tablet Take 1 Tablet by mouth once daily with breakfast  . mirabegron ER (MYRBETRIQ) 25 MG TB24 tablet Take 1 tablet (25 mg total) by mouth daily.  . simvastatin (ZOCOR) 80  MG tablet Take 1 tablet (80 mg total) by mouth daily.  . SURE COMFORT PEN NEEDLES 31G X 8 MM MISC USE TO inject lantus ONCE daily     Past Surgical History:  Procedure Laterality Date  . CAROTID ENDARTERECTOMY Right   . CAROTID STENT      Family History  Problem Relation Age of Onset  . Heart disease Father   . Heart attack Father   . Hypertension Father   . Diabetes Sister   . Stroke Sister   . Diabetes Brother   . Diabetes Sister   . Diabetes Sister   . Diabetes Brother   . Diabetes Brother   . Diabetes Brother     New complaints: None today  Social history: Lives with her husband  Controlled substance contract: will have signed at next visit     Review of Systems  Constitutional: Negative for diaphoresis and weight loss.  Eyes: Negative for blurred vision, double vision and pain.  Respiratory: Negative for shortness of breath.   Cardiovascular: Negative for chest pain, palpitations, orthopnea and leg swelling.  Gastrointestinal: Negative for abdominal pain.  Skin: Negative for rash.  Neurological: Negative for dizziness, sensory change, loss of consciousness, weakness and headaches.  Endo/Heme/Allergies: Negative for polydipsia. Does not bruise/bleed easily.  Psychiatric/Behavioral: Negative for memory loss. The patient does not have insomnia.   All other systems reviewed and are negative.    Observations/Objective: Alert and oriented- answers all questions appropriately No distress noted  Assessment and Plan: Kylie Ward comes in today with chief complaint of Medical Management of Chronic Issues   Diagnosis and orders addressed:  1. Essential hypertension, benign Low sodium diet - CMP14+EGFR; Future - lisinopril (ZESTRIL) 20 MG tablet; Take 1 tablet (20 mg total) by mouth daily.  Dispense: 90 tablet; Refill: 1  2. Type 2 diabetes mellitus with diabetic polyneuropathy, with long-term current use of insulin (HCC) Carb counting Will cime in  and have labs drawn to morrow- will make med adjustments based on labs - Bayer DCA Hb A1c Waived; Future - liraglutide (VICTOZA) 18 MG/3ML SOPN; Inject 0.3 mLs (1.8 mg total) into the skin daily.  Dispense: 27 mL; Refill: 5 - insulin glargine (LANTUS) 100 UNIT/ML injection; Inject 0.7 mLs (70 Units total) into the skin at bedtime.  Dispense: 80 mL; Refill: 5 - gabapentin (NEURONTIN) 300 MG capsule; Take 1 capsule (300 mg total) by mouth at bedtime.  Dispense: 90 capsule; Refill: 1 - metFORMIN (GLUCOPHAGE-XR) 750 MG 24 hr tablet; Take 1 Tablet by mouth once daily with breakfast  Dispense: 90 tablet; Refill: 1 - insulin lispro (HUMALOG) 100 UNIT/ML injection; Sliding scale up to 12u TID  Dispense: 10 mL; Refill: 11  3. Hyperlipidemia with target LDL less than 100 Low fat diet - Lipid panel; Future - simvastatin (ZOCOR) 80 MG tablet; Take 1 tablet (80 mg total) by mouth daily.  Dispense: 90 tablet; Refill: 1 - fenofibrate 160 MG  tablet; Take 1 tablet (160 mg total) by mouth daily.  Dispense: 90 tablet; Refill: 1  4. GAD (generalized anxiety disorder) Stress management - ALPRAZolam (XANAX) 1 MG tablet; Take 1 tablet (1 mg total) by mouth 2 (two) times daily.  Dispense: 60 tablet; Refill: 2  5. Peripheral edema Elevate legs when sitting - furosemide (LASIX) 40 MG tablet; Take 1 tablet (40 mg total) by mouth daily.  Dispense: 90 tablet; Refill: 1  6. Late effect of cerebrovascular accident (CVA)  7. BMI 31.0-31.9,adult Discussed diet and exercise for person with BMI >25 Will recheck weight in 3-6 months  8. Urinary urgency - mirabegron ER (MYRBETRIQ) 25 MG TB24 tablet; Take 1 tablet (25 mg total) by mouth daily.  Dispense: 30 tablet; Refill: 5   Labs pending Health Maintenance reviewed Diet and exercise encouraged  Follow up plan: 3 months      I discussed the assessment and treatment plan with the patient. The patient was provided an opportunity to ask questions and all were  answered. The patient agreed with the plan and demonstrated an understanding of the instructions.   The patient was advised to call back or seek an in-person evaluation if the symptoms worsen or if the condition fails to improve as anticipated.  The above assessment and management plan was discussed with the patient. The patient verbalized understanding of and has agreed to the management plan. Patient is aware to call the clinic if symptoms persist or worsen. Patient is aware when to return to the clinic for a follow-up visit. Patient educated on when it is appropriate to go to the emergency department.   Time call ended:  1:03  I provided 18 minutes of non-face-to-face time during this encounter.    Mary-Margaret Hassell Done, FNP

## 2019-10-01 ENCOUNTER — Telehealth: Payer: Self-pay | Admitting: Nurse Practitioner

## 2019-10-01 NOTE — Chronic Care Management (AMB) (Signed)
Chronic Care Management   Note  10/01/2019 Name: Kylie Ward MRN: [id number] DOB: October 02, 1946  Kylie Ward is a 73 y.o. year old female who is a primary care patient of Chevis Pretty, [place]. I reached out to Bea Graff by phone today in response to a referral sent by Ms. Sanda Klein Hovater's health plan.     [name] was given information about Chronic Care Management services today including:  1. CCM service includes personalized support from designated clinical staff supervised by her physician, including individualized plan of care and coordination with other care providers 2. 24/7 contact phone numbers for assistance for urgent and routine care needs. 3. Service will only be billed when office clinical staff spend 20 minutes or more in a month to coordinate care. 4. Only one practitioner may furnish and bill the service in a calendar month. 5. The patient may stop CCM services at any time (effective at the end of the month) by phone call to the office staff. 6. The patient will be responsible for cost sharing (co-pay) of up to 20% of the service fee (after annual deductible is met).  Patient did not agree to enrollment in care management services and does not wish to consider at this time.  Follow up plan: The patient has been provided with contact information for the chronic care management team and has been advised to call with any health related questions or concerns.   [place], [place] [address] Direct Dial: [email or web address]  Website: [email or web address]

## 2019-10-04 ENCOUNTER — Other Ambulatory Visit: Payer: Self-pay

## 2019-10-04 DIAGNOSIS — Z1231 Encounter for screening mammogram for malignant neoplasm of breast: Secondary | ICD-10-CM

## 2019-10-11 ENCOUNTER — Other Ambulatory Visit: Payer: Self-pay

## 2019-10-12 ENCOUNTER — Ambulatory Visit (INDEPENDENT_AMBULATORY_CARE_PROVIDER_SITE_OTHER): Payer: Medicare Other | Admitting: Nurse Practitioner

## 2019-10-12 ENCOUNTER — Encounter: Payer: Self-pay | Admitting: Nurse Practitioner

## 2019-10-12 VITALS — BP 112/73 | HR 84 | Temp 97.8°F | Resp 20 | Ht 61.0 in | Wt 164.0 lb

## 2019-10-12 DIAGNOSIS — E785 Hyperlipidemia, unspecified: Secondary | ICD-10-CM

## 2019-10-12 DIAGNOSIS — I10 Essential (primary) hypertension: Secondary | ICD-10-CM | POA: Diagnosis not present

## 2019-10-12 DIAGNOSIS — Z23 Encounter for immunization: Secondary | ICD-10-CM | POA: Diagnosis not present

## 2019-10-12 DIAGNOSIS — F411 Generalized anxiety disorder: Secondary | ICD-10-CM

## 2019-10-12 DIAGNOSIS — R609 Edema, unspecified: Secondary | ICD-10-CM | POA: Diagnosis not present

## 2019-10-12 DIAGNOSIS — Z794 Long term (current) use of insulin: Secondary | ICD-10-CM

## 2019-10-12 DIAGNOSIS — I6930 Unspecified sequelae of cerebral infarction: Secondary | ICD-10-CM

## 2019-10-12 DIAGNOSIS — R3915 Urgency of urination: Secondary | ICD-10-CM

## 2019-10-12 DIAGNOSIS — E1142 Type 2 diabetes mellitus with diabetic polyneuropathy: Secondary | ICD-10-CM

## 2019-10-12 DIAGNOSIS — Z6831 Body mass index (BMI) 31.0-31.9, adult: Secondary | ICD-10-CM

## 2019-10-12 LAB — BAYER DCA HB A1C WAIVED: HB A1C (BAYER DCA - WAIVED): 8.3 % — ABNORMAL HIGH (ref <7.0)

## 2019-10-12 MED ORDER — VICTOZA 18 MG/3ML ~~LOC~~ SOPN: 1.8000 mg | PEN_INJECTOR | Freq: Every day | SUBCUTANEOUS | 5 refills | Status: DC

## 2019-10-12 MED ORDER — ALPRAZOLAM 1 MG PO TABS: 1.0000 mg | ORAL_TABLET | Freq: Two times a day (BID) | ORAL | 2 refills | Status: DC

## 2019-10-12 MED ORDER — LISINOPRIL 20 MG PO TABS: 20.0000 mg | ORAL_TABLET | Freq: Every day | ORAL | 1 refills | Status: DC

## 2019-10-12 MED ORDER — SIMVASTATIN 80 MG PO TABS: 80.0000 mg | ORAL_TABLET | Freq: Every day | ORAL | 1 refills | Status: DC

## 2019-10-12 MED ORDER — FUROSEMIDE 40 MG PO TABS: 40.0000 mg | ORAL_TABLET | Freq: Every day | ORAL | 1 refills | Status: DC

## 2019-10-12 MED ORDER — INSULIN GLARGINE 100 UNIT/ML ~~LOC~~ SOLN: 80.0000 [IU] | Freq: Every day | SUBCUTANEOUS | 5 refills | Status: DC

## 2019-10-12 MED ORDER — GABAPENTIN 300 MG PO CAPS: 300.0000 mg | ORAL_CAPSULE | Freq: Every day | ORAL | 1 refills | Status: DC

## 2019-10-12 MED ORDER — MIRABEGRON ER 25 MG PO TB24: 25.0000 mg | ORAL_TABLET | Freq: Every day | ORAL | 5 refills | Status: DC

## 2019-10-12 MED ORDER — METFORMIN HCL ER 750 MG PO TB24: ORAL_TABLET | ORAL | 1 refills | Status: DC

## 2019-10-12 MED ORDER — INSULIN LISPRO 100 UNIT/ML ~~LOC~~ SOLN: SUBCUTANEOUS | 11 refills | Status: DC

## 2019-10-12 MED ORDER — FENOFIBRATE 160 MG PO TABS: 160.0000 mg | ORAL_TABLET | Freq: Every day | ORAL | 1 refills | Status: DC

## 2019-10-12 NOTE — Progress Notes (Signed)
Subjective:    Patient ID: Kylie Ward, female    DOB: 11/22/1945, 73 y.o.   MRN: [id number]   Chief Complaint: Medical Management of Chronic Issues    HPI:  1. Essential hypertension, benign No c/o chest pain, sob ir headache. Does not check blood pressure at home BP Readings from Last 3 Encounters:  02/16/19 116/80  01/15/19 (!) 192/95  01/09/19 108/84     2. Type 2 diabetes mellitus with diabetic polyneuropathy, with long-term current use of insulin (HCC) She not compliant with her diet at all. She sometimes forgets to take her meidcation. Her blood sugars are up and down. Usually in 130's in the morning. No low blood sugars. She was suppos eto come in in July to have labs repeated but she dd not come in. Lab Results  Component Value Date   HGBA1C 8.2 (H) 02/16/2019     3. Hyperlipidemia with target LDL less than 100 Does not watch diet and does no exercise. Lab Results  Component Value Date   CHOL 207 (H) 01/15/2019   HDL 41 01/15/2019   LDLCALC Comment 01/15/2019   TRIG 454 (H) 01/15/2019   CHOLHDL 5.0 (H) 01/15/2019     4. Peripheral edema Has edema of lower ext by the end of each day but ususally resolves at night.  5. GAD (generalized anxiety disorder) Stays stressed. Says she worries about everything. Takes 43m of xanax BID and has been on this for many years. We have attempted to wean her of but becomes very anxious when she does not take it.  6. Late effect of cerebrovascular accident (CVA) Denies nay weakness on either side. No recent strokes that she is aware of  7. BMI 31.0-31.9,adult Weight is unchanged Wt Readings from Last 3 Encounters:  10/12/19 164 lb (74.4 kg)  02/16/19 166 lb (75.3 kg)  01/15/19 166 lb (75.3 kg)       Outpatient Encounter Medications as of 10/12/2019  Medication Sig  . ACCU-CHEK FASTCLIX LANCETS MISC USE TO check sugar THREE times daily  . alendronate (FOSAMAX) 70 MG tablet Take 1 tablet (70 mg total) by mouth  once a week. Take with a full glass of water on an empty stomach.  . ALPRAZolam (XANAX) 1 MG tablet Take 1 tablet (1 mg total) by mouth 2 (two) times daily.  .Marland Kitchenaspirin 81 MG tablet Take 81 mg by mouth daily.  . Blood Glucose Monitoring Suppl (ACCU-CHEK NANO SMARTVIEW) w/Device KIT CHECK BLOOD SUGER UP TO 3 TIMES A DAY  . Continuous Blood Gluc Receiver (FREESTYLE LIBRE 14 DAY READER) DEVI 1 each by Does not apply route daily. (Patient not taking: Reported on 02/16/2019)  . Continuous Blood Gluc Sensor (FREESTYLE LIBRE 14 DAY SENSOR) MISC 1 each by Does not apply route every 14 (fourteen) days. (Patient not taking: Reported on 02/16/2019)  . Elastic Bandages & Supports (V-2 HIGH COMPRESSION HOSE) MISC 1 each by Does not apply route daily.  . fenofibrate 160 MG tablet Take 1 tablet (160 mg total) by mouth daily.  . furosemide (LASIX) 40 MG tablet Take 1 tablet (40 mg total) by mouth daily.  .Marland Kitchengabapentin (NEURONTIN) 300 MG capsule Take 1 capsule (300 mg total) by mouth at bedtime.  .Marland Kitchenglucose blood (ACCU-CHEK SMARTVIEW) test strip Check blood sugar three times daily Dx E11.9  . insulin glargine (LANTUS) 100 UNIT/ML injection Inject 0.7 mLs (70 Units total) into the skin at bedtime.  . insulin lispro (HUMALOG) 100 UNIT/ML injection Sliding scale  up to 12u TID  . liraglutide (VICTOZA) 18 MG/3ML SOPN Inject 0.3 mLs (1.8 mg total) into the skin daily.  Marland Kitchen lisinopril (ZESTRIL) 20 MG tablet Take 1 tablet (20 mg total) by mouth daily.  . metFORMIN (GLUCOPHAGE-XR) 750 MG 24 hr tablet Take 1 Tablet by mouth once daily with breakfast  . mirabegron ER (MYRBETRIQ) 25 MG TB24 tablet Take 1 tablet (25 mg total) by mouth daily.  . simvastatin (ZOCOR) 80 MG tablet Take 1 tablet (80 mg total) by mouth daily.  . SURE COMFORT PEN NEEDLES 31G X 8 MM MISC USE TO inject lantus ONCE daily     Past Surgical History:  Procedure Laterality Date  . CAROTID ENDARTERECTOMY Right   . CAROTID STENT      Family History    Problem Relation Age of Onset  . Heart disease Father   . Heart attack Father   . Hypertension Father   . Diabetes Sister   . Stroke Sister   . Diabetes Brother   . Diabetes Sister   . Diabetes Sister   . Diabetes Brother   . Diabetes Brother   . Diabetes Brother     New complaints: None today  Social history: lives with her husband  Controlled substance contract: 10/12/19    Review of Systems  Constitutional: Negative for diaphoresis.  Eyes: Negative for pain.  Respiratory: Negative for shortness of breath.   Cardiovascular: Negative for chest pain, palpitations and leg swelling.  Gastrointestinal: Negative for abdominal pain.  Endocrine: Negative for polydipsia.  Skin: Negative for rash.  Neurological: Negative for dizziness, weakness and headaches.  Hematological: Does not bruise/bleed easily.  All other systems reviewed and are negative.      Objective:   Physical Exam Vitals and nursing note reviewed.  Constitutional:      General: She is not in acute distress.    Appearance: Normal appearance. She is well-developed.  HENT:     Head: Normocephalic.     Nose: Nose normal.  Eyes:     Pupils: Pupils are equal, round, and reactive to light.  Neck:     Vascular: No carotid bruit or JVD.  Cardiovascular:     Rate and Rhythm: Normal rate and regular rhythm.     Heart sounds: Normal heart sounds.     Comments: Right carotid bruit Pulmonary:     Effort: Pulmonary effort is normal. No respiratory distress.     Breath sounds: Normal breath sounds. No wheezing or rales.  Chest:     Chest wall: No tenderness.  Abdominal:     General: Bowel sounds are normal. There is no distension or abdominal bruit.     Palpations: Abdomen is soft. There is no hepatomegaly, splenomegaly, mass or pulsatile mass.     Tenderness: There is no abdominal tenderness.  Musculoskeletal:        General: Normal range of motion.     Cervical back: Normal range of motion and neck  supple.     Right lower leg: Edema (1+) present.     Left lower leg: Edema (mild) present.  Lymphadenopathy:     Cervical: No cervical adenopathy.  Skin:    General: Skin is warm and dry.  Neurological:     Mental Status: She is alert and oriented to person, place, and time.     Deep Tendon Reflexes: Reflexes are normal and symmetric.  Psychiatric:        Behavior: Behavior normal.  Thought Content: Thought content normal.        Judgment: Judgment normal.     BP 112/73   Pulse 84   Temp 97.8 F (36.6 C) (Temporal)   Resp 20   Ht _0  (1.549 m)   Wt 164 lb (74.4 kg)   LMP 01/27/1983   SpO2 97%   BMI 30.99 kg/m   HGBA1c 8.3%     Assessment & Plan:  Kylie Ward comes in today with chief complaint of Medical Management of Chronic Issues   Diagnosis and orders addressed:  1. Essential hypertension, benign Low sodium diet - CMP14+EGFR - lisinopril (ZESTRIL) 20 MG tablet; Take 1 tablet (20 mg total) by mouth daily.  Dispense: 90 tablet; Refill: 1  2. Type 2 diabetes mellitus with diabetic polyneuropathy, with long-term current use of insulin (HCC) iuncrease lantus to 75u for 1 week then 80u qhs - hgba1c - Microalbumin / creatinine urine ratio - insulin glargine (LANTUS) 100 UNIT/ML injection; Inject 0.8 mLs (80 Units total) into the skin at bedtime.  Dispense: 80 mL; Refill: 5 - gabapentin (NEURONTIN) 300 MG capsule; Take 1 capsule (300 mg total) by mouth at bedtime.  Dispense: 90 capsule; Refill: 1 - liraglutide (VICTOZA) 18 MG/3ML SOPN; Inject 0.3 mLs (1.8 mg total) into the skin daily.  Dispense: 27 mL; Refill: 5 - metFORMIN (GLUCOPHAGE-XR) 750 MG 24 hr tablet; Take 1 Tablet by mouth once daily with breakfast  Dispense: 90 tablet; Refill: 1 - insulin lispro (HUMALOG) 100 UNIT/ML injection; Sliding scale up to 12u TID  Dispense: 10 mL; Refill: 11  3. Hyperlipidemia with target LDL less than 100 Low sodium diet - Lipid panel - simvastatin (ZOCOR) 80 MG  tablet; Take 1 tablet (80 mg total) by mouth daily.  Dispense: 90 tablet; Refill: 1 - fenofibrate 160 MG tablet; Take 1 tablet (160 mg total) by mouth daily.  Dispense: 90 tablet; Refill: 1  4. Peripheral edema elevate legs while sitting - furosemide (LASIX) 40 MG tablet; Take 1 tablet (40 mg total) by mouth daily.  Dispense: 90 tablet; Refill: 1  5. GAD (generalized anxiety disorder) Stress management Patient asked  To cut morning to to 1/2 tablet and she said she wold try - ALPRAZolam (XANAX) 1 MG tablet; Take 1 tablet (1 mg total) by mouth 2 (two) times daily.  Dispense: 60 tablet; Refill: 2  6. Late effect of cerebrovascular accident (CVA)  7. BMI 31.0-31.9,adult Discussed diet and exercise for person with BMI >25 Will recheck weight in 3-6 months  8. Urinary urgency - mirabegron ER (MYRBETRIQ) 25 MG TB24 tablet; Take 1 tablet (25 mg total) by mouth daily.  Dispense: 30 tablet; Refill: 5   Labs pending Health Maintenance reviewed Diet and exercise encouraged  Follow up plan: 3 months   Mary-Margaret Hassell Done, FNP

## 2019-10-12 NOTE — Patient Instructions (Signed)
Carbohydrate Counting for Diabetes Mellitus, Adult  Carbohydrate counting is a method of keeping track of how many carbohydrates you eat. Eating carbohydrates naturally increases the amount of sugar (glucose) in the blood. Counting how many carbohydrates you eat helps keep your blood glucose within normal limits, which helps you manage your diabetes (diabetes mellitus). It is important to know how many carbohydrates you can safely have in each meal. This is different for every person. A diet and nutrition specialist (registered dietitian) can help you make a meal plan and calculate how many carbohydrates you should have at each meal and snack. Carbohydrates are found in the following foods:  Grains, such as breads and cereals.  Dried beans and soy products.  Starchy vegetables, such as potatoes, peas, and corn.  Fruit and fruit juices.  Milk and yogurt.  Sweets and snack foods, such as cake, cookies, candy, chips, and soft drinks. How do I count carbohydrates? There are two ways to count carbohydrates in food. You can use either of the methods or a combination of both. Reading "Nutrition Facts" on packaged food The "Nutrition Facts" list is included on the labels of almost all packaged foods and beverages in the [place] It includes:  The serving size.  Information about nutrients in each serving, including the grams (g) of carbohydrate per serving. To use the "Nutrition Facts":  Decide how many servings you will have.  Multiply the number of servings by the number of carbohydrates per serving.  The resulting number is the total amount of carbohydrates that you will be having. Learning standard serving sizes of other foods When you eat carbohydrate foods that are not packaged or do not include "Nutrition Facts" on the label, you need to measure the servings in order to count the amount of carbohydrates:  Measure the foods that you will eat with a food scale or measuring cup, if needed.   Decide how many standard-size servings you will eat.  Multiply the number of servings by 15. Most carbohydrate-rich foods have about 15 g of carbohydrates per serving. ? For example, if you eat 8 oz (170 g) of strawberries, you will have eaten 2 servings and 30 g of carbohydrates (2 servings x 15 g = 30 g).  For foods that have more than one food mixed, such as soups and casseroles, you must count the carbohydrates in each food that is included. The following list contains standard serving sizes of common carbohydrate-rich foods. Each of these servings has about 15 g of carbohydrates:   hamburger bun or  English muffin.   oz (15 mL) syrup.   oz (14 g) jelly.  1 slice of bread.  1 six-inch tortilla.  3 oz (85 g) cooked rice or pasta.  4 oz (113 g) cooked dried beans.  4 oz (113 g) starchy vegetable, such as peas, corn, or potatoes.  4 oz (113 g) hot cereal.  4 oz (113 g) mashed potatoes or  of a large baked potato.  4 oz (113 g) canned or frozen fruit.  4 oz (120 mL) fruit juice.  4-6 crackers.  6 chicken nuggets.  6 oz (170 g) unsweetened dry cereal.  6 oz (170 g) plain fat-free yogurt or yogurt sweetened with artificial sweeteners.  8 oz (240 mL) milk.  8 oz (170 g) fresh fruit or one small piece of fruit.  24 oz (680 g) popped popcorn. Example of carbohydrate counting Sample meal  3 oz (85 g) chicken breast.  6 oz (170 g)   brown rice.  4 oz (113 g) corn.  8 oz (240 mL) milk.  8 oz (170 g) strawberries with sugar-free whipped topping. Carbohydrate calculation 1. Identify the foods that contain carbohydrates: ? Rice. ? Corn. ? Milk. ? Strawberries. 2. Calculate how many servings you have of each food: ? 2 servings rice. ? 1 serving corn. ? 1 serving milk. ? 1 serving strawberries. 3. Multiply each number of servings by 15 g: ? 2 servings rice x 15 g = 30 g. ? 1 serving corn x 15 g = 15 g. ? 1 serving milk x 15 g = 15 g. ? 1 serving  strawberries x 15 g = 15 g. 4. Add together all of the amounts to find the total grams of carbohydrates eaten: ? 30 g + 15 g + 15 g + 15 g = 75 g of carbohydrates total. Summary  Carbohydrate counting is a method of keeping track of how many carbohydrates you eat.  Eating carbohydrates naturally increases the amount of sugar (glucose) in the blood.  Counting how many carbohydrates you eat helps keep your blood glucose within normal limits, which helps you manage your diabetes.  A diet and nutrition specialist (registered dietitian) can help you make a meal plan and calculate how many carbohydrates you should have at each meal and snack. This information is not intended to replace advice given to you by your health care provider. Make sure you discuss any questions you have with your health care provider. Document Released: 10/18/2005 Document Revised: 05/12/2017 Document Reviewed: 03/31/2016 Elsevier Patient Education  2020 [employer]  

## 2019-10-13 LAB — LIPID PANEL
Chol/HDL Ratio: 5.5 ratio — ABNORMAL HIGH (ref 0.0–4.4)
Cholesterol, Total: 219 mg/dL — ABNORMAL HIGH (ref 100–199)
HDL: 40 mg/dL (ref >39)
LDL Chol Calc (NIH): 128 mg/dL — ABNORMAL HIGH (ref 0–99)
Triglycerides: 285 mg/dL — ABNORMAL HIGH (ref 0–149)
VLDL Cholesterol Cal: 51 mg/dL — ABNORMAL HIGH (ref 5–40)

## 2019-10-13 LAB — CMP14+EGFR
ALT: 27 IU/L (ref 0–32)
AST: 21 IU/L (ref 0–40)
Albumin/Globulin Ratio: 2.2 (ref 1.2–2.2)
Albumin: 4.4 g/dL (ref 3.7–4.7)
Alkaline Phosphatase: 96 IU/L (ref 39–117)
BUN/Creatinine Ratio: 13 (ref 12–28)
BUN: 10 mg/dL (ref 8–27)
Bilirubin Total: 0.3 mg/dL (ref 0.0–1.2)
CO2: 27 mmol/L (ref 20–29)
Calcium: 9.6 mg/dL (ref 8.7–10.3)
Chloride: 103 mmol/L (ref 96–106)
Creatinine, Ser: 0.76 mg/dL (ref 0.57–1.00)
GFR calc Af Amer: 91 mL/min/{1.73_m2} (ref >59)
GFR calc non Af Amer: 79 mL/min/{1.73_m2} (ref >59)
Globulin, Total: 2 g/dL (ref 1.5–4.5)
Glucose: 118 mg/dL — ABNORMAL HIGH (ref 65–99)
Potassium: 5 mmol/L (ref 3.5–5.2)
Sodium: 142 mmol/L (ref 134–144)
Total Protein: 6.4 g/dL (ref 6.0–8.5)

## 2019-11-15 ENCOUNTER — Other Ambulatory Visit: Payer: Self-pay | Admitting: Nurse Practitioner

## 2019-11-29 ENCOUNTER — Ambulatory Visit: Payer: Medicare Other

## 2019-12-04 ENCOUNTER — Telehealth: Payer: Self-pay | Admitting: Nurse Practitioner

## 2019-12-04 MED ORDER — SURE COMFORT PEN NEEDLES 31G X 8 MM MISC: 2 refills | Status: DC

## 2019-12-04 NOTE — Telephone Encounter (Signed)
Sent to pharmacy 

## 2019-12-04 NOTE — Addendum Note (Signed)
Addended by: Ignacia Bayley on: 12/04/2019 02:26 PM   Modules accepted: Orders

## 2019-12-04 NOTE — Telephone Encounter (Signed)
What is the name of the medication? Insulin Syringes, 100 unit, 60mm length, 31 gauge  Have you contacted your pharmacy to request a refill?  Yes  Which pharmacy would you like this sent to?  [facility]   Patient notified that their request is being sent to the clinical staff for review and that they should receive a call once it is complete. If they do not receive a call within 24 hours they can check with their pharmacy or our office.

## 2020-01-10 ENCOUNTER — Ambulatory Visit (INDEPENDENT_AMBULATORY_CARE_PROVIDER_SITE_OTHER): Payer: Medicare Other | Admitting: *Deleted

## 2020-01-10 DIAGNOSIS — Z0000 Encounter for general adult medical examination without abnormal findings: Secondary | ICD-10-CM | POA: Diagnosis not present

## 2020-01-10 NOTE — Progress Notes (Signed)
MEDICARE ANNUAL WELLNESS VISIT  01/10/2020  Telephone Visit Disclaimer This Medicare AWV was conducted by telephone due to national recommendations for restrictions regarding the COVID-19 Pandemic (e.g. social distancing).  I verified, using two identifiers, that I am speaking with Kylie Ward or their authorized healthcare agent. I discussed the limitations, risks, security, and privacy concerns of performing an evaluation and management service by telephone and the potential availability of an in-person appointment in the future. The patient expressed understanding and agreed to proceed.   Subjective:  Kylie Ward is a 73 y.o. female patient of Chevis Pretty, [place] who had a Medicare Annual Wellness Visit today via telephone. Kylie Ward is Retired and lives with their spouse. she has 1 child. she reports that she is socially active and does interact with friends/family regularly. she is minimally physically active and enjoys playing games on the computer.  Patient Care Team: Chevis Pretty, FNP as PCP - General (Nurse Practitioner) Suella Broad, MD as Consulting Physician (Physical Medicine and Rehabilitation)  Advanced Directives 01/10/2020 01/09/2019 12/29/2016 12/05/2015 05/08/2015 09/06/2014  Does Patient Have a Medical Advance Directive? _0  Yes;No  Would patient like information on creating a medical advance directive? No - Patient declined Yes (MAU/Ambulatory/Procedural Areas - Information given) Yes (MAU/Ambulatory/Procedural Areas - Information given) No - patient declined information Yes - Educational materials given Yes - Educational materials given    Hospital Utilization Over the Past 12 Months: # of hospitalizations or ER visits: 0 # of surgeries: 0  Review of Systems    Patient reports that her overall health is unchanged compared to last year.  History obtained from chart review  Patient Reported Readings (BP, Pulse, CBG, Weight,  etc) none  Pain Assessment Pain : No/denies pain     Current Medications & Allergies (verified) Allergies as of 01/10/2020      Reactions   Lipitor [atorvastatin]    Joint pain / myalgias   Niaspan [niacin Er]    Burn and itch all over      Medication List       Accurate as of January 10, 2020 10:45 AM. If you have any questions, ask your nurse or doctor.        Accu-Chek FastClix Lancets Misc CHECK BLOOD SUGAR UP TO 3 TIMES A DAY Dx E11.42   Accu-Chek Nano SmartView w/Device Kit CHECK BLOOD SUGER UP TO 3 TIMES A DAY   alendronate 70 MG tablet Commonly known as: Fosamax Take 1 tablet (70 mg total) by mouth once a week. Take with a full glass of water on an empty stomach.   ALPRAZolam 1 MG tablet Commonly known as: XANAX Take 1 tablet (1 mg total) by mouth 2 (two) times daily.   aspirin 81 MG tablet Take 81 mg by mouth daily.   DOANS PILLS PO Take by mouth.   fenofibrate 160 MG tablet Take 1 tablet (160 mg total) by mouth daily.   FreeStyle Libre 14 Day Reader Kerrin Mo 1 each by Does not apply route daily.   FreeStyle Libre 14 Day Sensor Misc 1 each by Does not apply route every 14 (fourteen) days.   furosemide 40 MG tablet Commonly known as: LASIX Take 1 tablet (40 mg total) by mouth daily.   gabapentin 300 MG capsule Commonly known as: NEURONTIN Take 1 capsule (300 mg total) by mouth at bedtime.   glucose blood test strip Commonly known as: Accu-Chek SmartView Check blood sugar three times daily Dx E11.9  insulin glargine 100 UNIT/ML injection Commonly known as: Lantus Inject 0.8 mLs (80 Units total) into the skin at bedtime.   insulin lispro 100 UNIT/ML injection Commonly known as: HumaLOG Sliding scale up to 12u TID   lisinopril 20 MG tablet Commonly known as: ZESTRIL Take 1 tablet (20 mg total) by mouth daily.   metFORMIN 750 MG 24 hr tablet Commonly known as: GLUCOPHAGE-XR Take 1 Tablet by mouth once daily with breakfast   mirabegron ER  25 MG Tb24 tablet Commonly known as: Myrbetriq Take 1 tablet (25 mg total) by mouth daily.   simvastatin 80 MG tablet Commonly known as: ZOCOR Take 1 tablet (80 mg total) by mouth daily.   Sure Comfort Pen Needles 31G X 8 MM Misc Generic drug: Insulin Pen Needle USE TO inject lantus ONCE daily   V-2 High Compression Hose Misc 1 each by Does not apply route daily.   Victoza 18 MG/3ML Sopn Generic drug: liraglutide Inject 0.3 mLs (1.8 mg total) into the skin daily.       History (reviewed): Past Medical History:  Diagnosis Date  . Anxiety   . Diabetes mellitus without complication ([place])   . Hyperlipidemia   . Hypertension   . Stroke [facility])    Past Surgical History:  Procedure Laterality Date  . CAROTID ENDARTERECTOMY Right   . CAROTID STENT     Family History  Problem Relation Age of Onset  . Heart disease Father   . Heart attack Father   . Hypertension Father   . Diabetes Sister   . Stroke Sister   . Diabetes Brother   . Diabetes Sister   . Diabetes Sister   . Diabetes Brother   . Diabetes Brother   . Diabetes Brother    Social History   Socioeconomic History  . Marital status: Married    Spouse name: Ozzie Hoyle  . Number of children: 1  . Years of education: 3  . Highest education level: 10th grade  Occupational History  . Occupation: retired    Fish farm manager: [employer]  Tobacco Use  . Smoking status: Former Smoker    Packs/day: 1.00    Types: Cigarettes    Quit date: 12/02/2009    Years since quitting: 10.1  . Smokeless tobacco: Never Used  Substance and Sexual Activity  . Alcohol use: No  . Drug use: No  . Sexual activity: Yes    Birth control/protection: Post-menopausal  Other Topics Concern  . Not on file  Social History Narrative  . Not on file   Social Determinants of Health   Financial Resource Strain: Low Risk   . Difficulty of Paying Living Expenses: Not hard at all  Food Insecurity: No Food Insecurity  . Worried About Sales executive in the Last Year: Never true  . Ran Out of Food in the Last Year: Never true  Transportation Needs: No Transportation Needs  . Lack of Transportation (Medical): No  . Lack of Transportation (Non-Medical): No  Physical Activity: Inactive  . Days of Exercise per Week: 0 days  . Minutes of Exercise per Session: 0 min  Stress: Stress Concern Present  . Feeling of Stress : Rather much  Social Connections: Somewhat Isolated  . Frequency of Communication with Friends and Family: More than three times a week  . Frequency of Social Gatherings with Friends and Family: More than three times a week  . Attends Religious Services: Never  . Active Member of Clubs or Organizations: No  .  Attends Archivist Meetings: Never  . Marital Status: Married    Activities of Daily Living In your present state of health, do you have any difficulty performing the following activities: 01/10/2020  Hearing? N  Vision? N  Comment is due for diabetic eye exam-will schedule with Dr Radford Pax in [place]  Difficulty concentrating or making decisions? N  Walking or climbing stairs? Y  Comment she doesn't like to use the stairs-afraid she will fall  Dressing or bathing? N  Doing errands, shopping? Y  Comment her husband drives her to all appointments and does most of the errands  Preparing Food and eating ? N  Using the Toilet? N  In the past six months, have you accidently leaked urine? N  Do you have problems with loss of bowel control? N  Managing your Medications? N  Managing your Finances? N  Housekeeping or managing your Housekeeping? N  Some recent data might be hidden    Patient Education/ Literacy How often do you need to have someone help you when you read instructions, pamphlets, or other written materials from your doctor or pharmacy?: 1 - Never What is the last grade level you completed in school?: 10th grade  Exercise Current Exercise Habits: The patient does not participate in regular  exercise at present, Exercise limited by: orthopedic condition(s)  Diet Patient reports consuming 2 meals a day and 1 snack(s) a day Patient reports that her primary diet is: Regular Patient reports that she does have regular access to food.   Depression Screen PHQ 2/9 Scores 01/10/2020 10/12/2019 02/16/2019 01/15/2019 01/09/2019 10/02/2018 06/27/2018  PHQ - 2 Score 5 0 0 _0 PHQ- 9 Score 8 - - _1 Fall Risk Fall Risk  01/10/2020 10/12/2019 02/16/2019 01/15/2019 01/09/2019  Falls in the past year? 0 0 0 0 1  Number falls in past yr: - - - - 0  Injury with Fall? - - - - 1  Risk for fall due to : - - - - Impaired balance/gait  Follow up - - - - Education provided;Falls prevention discussed     Objective:  Kylie Ward seemed alert and oriented and she participated appropriately during our telephone visit.  Blood Pressure Weight BMI  BP Readings from Last 3 Encounters:  10/12/19 112/73  02/16/19 116/80  01/15/19 (!) 192/95   Wt Readings from Last 3 Encounters:  10/12/19 164 lb (74.4 kg)  02/16/19 166 lb (75.3 kg)  01/15/19 166 lb (75.3 kg)   BMI Readings from Last 1 Encounters:  10/12/19 30.99 kg/m    *Unable to obtain current vital signs, weight, and BMI due to telephone visit type  Hearing/Vision  . Kylie Ward did not seem to have difficulty with hearing/understanding during the telephone conversation . Reports that she has not had a formal eye exam by an eye care professional within the past year . Reports that she has not had a formal hearing evaluation within the past year *Unable to fully assess hearing and vision during telephone visit type  Cognitive Function: 6CIT Screen 01/10/2020  What Year? 0 points  What month? 0 points  What time? 0 points  Count back from 20 0 points  Months in reverse 4 points  Repeat phrase 8 points  Total Score 12   (Normal:0-7, Significant for Dysfunction: >8)  Normal Cognitive Function Screening: No: Recommend repeating  MMSE at next visit with PCP-offered to schedule a visit just for MMSE  but pt declined   Immunization & Health Maintenance Record Immunization History  Administered Date(s) Administered  . Fluad Quad(high Dose 65+) 10/12/2019  . Influenza, High Dose Seasonal PF 08/22/2017, 10/02/2018  . Influenza,inj,Quad PF,6+ Mos 10/19/2013, 09/06/2014, 08/14/2015, 09/02/2016  . Pneumococcal Conjugate-13 08/14/2015  . Pneumococcal Polysaccharide-23 07/14/2012, 01/09/2019  . Td 08/30/2011  . Tdap 08/30/2011    Health Maintenance  Topic Date Due  . MAMMOGRAM  Never done  . OPHTHALMOLOGY EXAM  04/21/2019  . FOOT EXAM  01/15/2020  . HEMOGLOBIN A1C  04/11/2020  . DEXA SCAN  01/08/2021  . TETANUS/TDAP  08/29/2021  . Fecal DNA (Cologuard)  01/23/2022  . INFLUENZA VACCINE  Completed  . Hepatitis C Screening  Completed  . PNA vac Low Risk Adult  Completed       Assessment  This is a routine wellness examination for Kylie Ward.  Health Maintenance: Due or Overdue Health Maintenance Due  Topic Date Due  . MAMMOGRAM  Never done  . OPHTHALMOLOGY EXAM  04/21/2019    Kylie Ward does not need a referral for Community Assistance: Care Management:   no Social Work:    no Prescription Assistance:  no Nutrition/Diabetes Education:  no   Plan:  Personalized Goals Goals Addressed            This Visit's Progress   . DIET - INCREASE WATER INTAKE       Try to drink 6-8 glasses of water daily      Personalized Health Maintenance & Screening Recommendations  Screening mammography Shingles vaccine  Diabetic Eye Exam  Lung Cancer Screening Recommended: no (Low Dose CT Chest recommended if Age 35-80 years, 30 pack-year currently smoking OR have quit w/in past 15 years) Hepatitis C Screening recommended: no HIV Screening recommended: no  Advanced Directives: Written information was not prepared per patient's request.  Referrals & Orders No orders of the defined types were  placed in this encounter.   Follow-up Plan . Follow-up with Chevis Pretty, FNP as planned . Schedule your Diabetic Eye Exam as discussed . Schedule your Screening Mammogram as discussed . Consider Shingles vaccine at your next visit with your PCP   I have personally reviewed and noted the following in the patient's chart:   . Medical and social history . Use of alcohol, tobacco or illicit drugs  . Current medications and supplements . Functional ability and status . Nutritional status . Physical activity . Advanced directives . List of other physicians . Hospitalizations, surgeries, and ER visits in previous 12 months . Vitals . Screenings to include cognitive, depression, and falls . Referrals and appointments  In addition, I have reviewed and discussed with Kylie Ward certain preventive protocols, quality metrics, and best practice recommendations. A written personalized care plan for preventive services as well as general preventive health recommendations is available and can be mailed to the patient at her request.      Milas Hock, LPN  05/09/6282

## 2020-01-10 NOTE — Patient Instructions (Signed)
Preventive Care 38 Years and Older, Female Preventive care refers to lifestyle choices and visits with your health care provider that can promote health and wellness. This includes:  A yearly physical exam. This is also called an annual well check.  Regular dental and eye exams.  Immunizations.  Screening for certain conditions.  Healthy lifestyle choices, such as diet and exercise. What can I expect for my preventive care visit? Physical exam Your health care provider will check:  Height and weight. These may be used to calculate body mass index (BMI), which is a measurement that tells if you are at a healthy weight.  Heart rate and blood pressure.  Your skin for abnormal spots. Counseling Your health care provider may ask you questions about:  Alcohol, tobacco, and drug use.  Emotional well-being.  Home and relationship well-being.  Sexual activity.  Eating habits.  History of falls.  Memory and ability to understand (cognition).  Work and work Statistician.  Pregnancy and menstrual history. What immunizations do I need?  Influenza (flu) vaccine  This is recommended every year. Tetanus, diphtheria, and pertussis (Tdap) vaccine  You may need a Td booster every 10 years. Varicella (chickenpox) vaccine  You may need this vaccine if you have not already been vaccinated. Zoster (shingles) vaccine  You may need this after age 33. Pneumococcal conjugate (PCV13) vaccine  One dose is recommended after age 33. Pneumococcal polysaccharide (PPSV23) vaccine  One dose is recommended after age 72. Measles, mumps, and rubella (MMR) vaccine  You may need at least one dose of MMR if you were born in 1957 or later. You may also need a second dose. Meningococcal conjugate (MenACWY) vaccine  You may need this if you have certain conditions. Hepatitis A vaccine  You may need this if you have certain conditions or if you travel or work in places where you may be exposed  to hepatitis A. Hepatitis B vaccine  You may need this if you have certain conditions or if you travel or work in places where you may be exposed to hepatitis B. Haemophilus influenzae type b (Hib) vaccine  You may need this if you have certain conditions. You may receive vaccines as individual doses or as more than one vaccine together in one shot (combination vaccines). Talk with your health care provider about the risks and benefits of combination vaccines. What tests do I need? Blood tests  Lipid and cholesterol levels. These may be checked every 5 years, or more frequently depending on your overall health.  Hepatitis C test.  Hepatitis B test. Screening  Lung cancer screening. You may have this screening every year starting at age 39 if you have a 30-pack-year history of smoking and currently smoke or have quit within the past 15 years.  Colorectal cancer screening. All adults should have this screening starting at age 36 and continuing until age 15. Your health care provider may recommend screening at age 23 if you are at increased risk. You will have tests every 1-10 years, depending on your results and the type of screening test.  Diabetes screening. This is done by checking your blood sugar (glucose) after you have not eaten for a while (fasting). You may have this done every 1-3 years.  Mammogram. This may be done every 1-2 years. Talk with your health care provider about how often you should have regular mammograms.  BRCA-related cancer screening. This may be done if you have a family history of breast, ovarian, tubal, or peritoneal cancers.  Other tests  Sexually transmitted disease (STD) testing.  Bone density scan. This is done to screen for osteoporosis. You may have this done starting at age 44. Follow these instructions at home: Eating and drinking  Eat a diet that includes fresh fruits and vegetables, whole grains, lean protein, and low-fat dairy products. Limit  your intake of foods with high amounts of sugar, saturated fats, and salt.  Take vitamin and mineral supplements as recommended by your health care provider.  Do not drink alcohol if your health care provider tells you not to drink.  If you drink alcohol: ? Limit how much you have to 0-1 drink a day. ? Be aware of how much alcohol is in your drink. In the [place] one drink equals one 12 oz bottle of beer (355 mL), one 5 oz glass of wine (148 mL), or one 1 oz glass of hard liquor (44 mL). Lifestyle  Take daily care of your teeth and gums.  Stay active. Exercise for at least 30 minutes on 5 or more days each week.  Do not use any products that contain nicotine or tobacco, such as cigarettes, e-cigarettes, and chewing tobacco. If you need help quitting, ask your health care provider.  If you are sexually active, practice safe sex. Use a condom or other form of protection in order to prevent STIs (sexually transmitted infections).  Talk with your health care provider about taking a low-dose aspirin or statin. What's next?  Go to your health care provider once a year for a well check visit.  Ask your health care provider how often you should have your eyes and teeth checked.  Stay up to date on all vaccines. This information is not intended to replace advice given to you by your health care provider. Make sure you discuss any questions you have with your health care provider. Document Revised: 10/12/2018 Document Reviewed: 10/12/2018 Elsevier Patient Education  2020 [employer].

## 2020-01-11 ENCOUNTER — Other Ambulatory Visit: Payer: Self-pay

## 2020-01-11 ENCOUNTER — Ambulatory Visit (INDEPENDENT_AMBULATORY_CARE_PROVIDER_SITE_OTHER): Payer: Medicare Other | Admitting: Nurse Practitioner

## 2020-01-11 ENCOUNTER — Encounter: Payer: Self-pay | Admitting: Nurse Practitioner

## 2020-01-11 VITALS — BP 125/83 | HR 83 | Temp 97.8°F | Ht 61.0 in | Wt 164.0 lb

## 2020-01-11 DIAGNOSIS — I10 Essential (primary) hypertension: Secondary | ICD-10-CM

## 2020-01-11 DIAGNOSIS — F411 Generalized anxiety disorder: Secondary | ICD-10-CM

## 2020-01-11 DIAGNOSIS — E785 Hyperlipidemia, unspecified: Secondary | ICD-10-CM

## 2020-01-11 DIAGNOSIS — I6930 Unspecified sequelae of cerebral infarction: Secondary | ICD-10-CM

## 2020-01-11 DIAGNOSIS — Z6831 Body mass index (BMI) 31.0-31.9, adult: Secondary | ICD-10-CM

## 2020-01-11 DIAGNOSIS — I700 Atherosclerosis of aorta: Secondary | ICD-10-CM

## 2020-01-11 DIAGNOSIS — E1142 Type 2 diabetes mellitus with diabetic polyneuropathy: Secondary | ICD-10-CM | POA: Diagnosis not present

## 2020-01-11 DIAGNOSIS — R3915 Urgency of urination: Secondary | ICD-10-CM

## 2020-01-11 DIAGNOSIS — Z794 Long term (current) use of insulin: Secondary | ICD-10-CM

## 2020-01-11 DIAGNOSIS — R609 Edema, unspecified: Secondary | ICD-10-CM

## 2020-01-11 DIAGNOSIS — R600 Localized edema: Secondary | ICD-10-CM

## 2020-01-11 LAB — BAYER DCA HB A1C WAIVED: HB A1C (BAYER DCA - WAIVED): 9.6 % — ABNORMAL HIGH (ref <7.0)

## 2020-01-11 MED ORDER — SIMVASTATIN 80 MG PO TABS: 80.0000 mg | ORAL_TABLET | Freq: Every day | ORAL | 1 refills | Status: DC

## 2020-01-11 MED ORDER — INSULIN GLARGINE 100 UNIT/ML ~~LOC~~ SOLN: 80.0000 [IU] | Freq: Every day | SUBCUTANEOUS | 5 refills | Status: DC

## 2020-01-11 MED ORDER — METFORMIN HCL ER 750 MG PO TB24: ORAL_TABLET | ORAL | 1 refills | Status: DC

## 2020-01-11 MED ORDER — FUROSEMIDE 40 MG PO TABS: 40.0000 mg | ORAL_TABLET | Freq: Every day | ORAL | 1 refills | Status: DC

## 2020-01-11 MED ORDER — GABAPENTIN 300 MG PO CAPS: 300.0000 mg | ORAL_CAPSULE | Freq: Every day | ORAL | 1 refills | Status: DC

## 2020-01-11 MED ORDER — ALPRAZOLAM 1 MG PO TABS: 1.0000 mg | ORAL_TABLET | Freq: Two times a day (BID) | ORAL | 2 refills | Status: DC

## 2020-01-11 MED ORDER — VICTOZA 18 MG/3ML ~~LOC~~ SOPN: 1.8000 mg | PEN_INJECTOR | Freq: Every day | SUBCUTANEOUS | 5 refills | Status: DC

## 2020-01-11 MED ORDER — LISINOPRIL 20 MG PO TABS: 20.0000 mg | ORAL_TABLET | Freq: Every day | ORAL | 1 refills | Status: DC

## 2020-01-11 MED ORDER — MIRABEGRON ER 25 MG PO TB24: 25.0000 mg | ORAL_TABLET | Freq: Every day | ORAL | 5 refills | Status: DC

## 2020-01-11 MED ORDER — FENOFIBRATE 160 MG PO TABS: 160.0000 mg | ORAL_TABLET | Freq: Every day | ORAL | 1 refills | Status: DC

## 2020-01-11 NOTE — Progress Notes (Signed)
Subjective:    Patient ID: Kylie Ward, female    DOB: February 07, 1946, 74 y.o.   MRN: [id number]   Chief Complaint: Medical Management of Chronic Issues    HPI:  1. Hyperlipidemia with target LDL less than 100 does not watch diet and does no exercise. Lab Results  Component Value Date   CHOL 219 (H) 10/12/2019   HDL 40 10/12/2019   LDLCALC 128 (H) 10/12/2019   TRIG 285 (H) 10/12/2019   CHOLHDL 5.5 (H) 10/12/2019     2. Essential hypertension, benign No c/o chest pain, sob or headache. Does not check blood pressure at home. BP Readings from Last 3 Encounters:  10/12/19 112/73  02/16/19 116/80  01/15/19 (!) 192/95     3. Type 2 diabetes mellitus with diabetic polyneuropathy, with long-term current use of insulin (HCC) Fasting blood sugars at home are up and down. Highest over 200 and lowest 40. She does not watch diet and does no exercise. We increased lantus at last visit to 80u but patient did nt make adjustments. She does sliding scale 3x a day with meals. She averages giving herself around 6units Lab Results  Component Value Date   HGBA1C 8.3 (H) 10/12/2019     4. GAD (generalized anxiety disorder) She stays anxious all the time. Her husband had a light heart attack 3 weeks ago. GAD 7 : Generalized Anxiety Score 01/11/2020  Nervous, Anxious, on Edge 3  Control/stop worrying 2  Worry too much - different things 3  Trouble relaxing 1  Restless 0  Easily annoyed or irritable 2  Afraid - awful might happen 2  Total GAD 7 Score 13  Anxiety Difficulty Somewhat difficult      5. Peripheral edema Does not have everyday. Elevates legs when she has edema  6. Atherosclerosis of aorta ([place]) Has not seen cardiology. She just does not want to go.  7. Late effect of cerebrovascular accident (CVA) No late effects.  8. BMI 31.0-31.9,adult No recent weight changes Wt Readings from Last 3 Encounters:  01/11/20 164 lb (74.4 kg)  10/12/19 164 lb (74.4 kg)    02/16/19 166 lb (75.3 kg)   BMI Readings from Last 3 Encounters:  01/11/20 30.99 kg/m  10/12/19 30.99 kg/m  02/16/19 31.37 kg/m       Outpatient Encounter Medications as of 01/11/2020  Medication Sig  . Accu-Chek FastClix Lancets MISC CHECK BLOOD SUGAR UP TO 3 TIMES A DAY Dx S93.73  . ALPRAZolam (XANAX) 1 MG tablet Take 1 tablet (1 mg total) by mouth 2 (two) times daily.  Marland Kitchen aspirin 81 MG tablet Take 81 mg by mouth daily.  . Blood Glucose Monitoring Suppl (ACCU-CHEK NANO SMARTVIEW) w/Device KIT CHECK BLOOD SUGER UP TO 3 TIMES A DAY  . Continuous Blood Gluc Receiver (FREESTYLE LIBRE 14 DAY READER) DEVI 1 each by Does not apply route daily.  . Continuous Blood Gluc Sensor (FREESTYLE LIBRE 14 DAY SENSOR) MISC 1 each by Does not apply route every 14 (fourteen) days.  . Elastic Bandages & Supports (V-2 HIGH COMPRESSION HOSE) MISC 1 each by Does not apply route daily.  . fenofibrate 160 MG tablet Take 1 tablet (160 mg total) by mouth daily.  . furosemide (LASIX) 40 MG tablet Take 1 tablet (40 mg total) by mouth daily.  Marland Kitchen gabapentin (NEURONTIN) 300 MG capsule Take 1 capsule (300 mg total) by mouth at bedtime.  Marland Kitchen glucose blood (ACCU-CHEK SMARTVIEW) test strip Check blood sugar three times daily Dx E11.9  .  insulin lispro (HUMALOG) 100 UNIT/ML injection Sliding scale up to 12u TID  . Insulin Pen Needle (SURE COMFORT PEN NEEDLES) 31G X 8 MM MISC USE TO inject lantus ONCE daily  . lisinopril (ZESTRIL) 20 MG tablet Take 1 tablet (20 mg total) by mouth daily.  . Magnesium Salicylate (DOANS PILLS PO) Take by mouth.  . metFORMIN (GLUCOPHAGE-XR) 750 MG 24 hr tablet Take 1 Tablet by mouth once daily with breakfast  . mirabegron ER (MYRBETRIQ) 25 MG TB24 tablet Take 1 tablet (25 mg total) by mouth daily.  . simvastatin (ZOCOR) 80 MG tablet Take 1 tablet (80 mg total) by mouth daily.  Marland Kitchen alendronate (FOSAMAX) 70 MG tablet Take 1 tablet (70 mg total) by mouth once a week. Take with a full glass of water  on an empty stomach. (Patient not taking: Reported on 01/10/2020)  . insulin glargine (LANTUS) 100 UNIT/ML injection Inject 0.8 mLs (80 Units total) into the skin at bedtime.  . liraglutide (VICTOZA) 18 MG/3ML SOPN Inject 0.3 mLs (1.8 mg total) into the skin daily.     Past Surgical History:  Procedure Laterality Date  . CAROTID ENDARTERECTOMY Right   . CAROTID STENT      Family History  Problem Relation Age of Onset  . Heart disease Father   . Heart attack Father   . Hypertension Father   . Diabetes Sister   . Stroke Sister   . Diabetes Brother   . Diabetes Sister   . Diabetes Sister   . Diabetes Brother   . Diabetes Brother   . Diabetes Brother     New complaints: Nothing bothering her except her anxiety  Social history: Lives with husband  Controlled substance contract: n/a    Review of Systems  Constitutional: Negative for diaphoresis.  Eyes: Negative for pain.  Respiratory: Negative for shortness of breath.   Cardiovascular: Negative for chest pain, palpitations and leg swelling.  Gastrointestinal: Negative for abdominal pain.  Endocrine: Negative for polydipsia.  Skin: Negative for rash.  Neurological: Negative for dizziness, weakness and headaches.  Hematological: Does not bruise/bleed easily.  All other systems reviewed and are negative.      Objective:   Physical Exam Vitals and nursing note reviewed.  Constitutional:      General: She is not in acute distress.    Appearance: Normal appearance. She is well-developed.  HENT:     Head: Normocephalic.     Nose: Nose normal.  Eyes:     Pupils: Pupils are equal, round, and reactive to light.  Neck:     Vascular: No carotid bruit or JVD.  Cardiovascular:     Rate and Rhythm: Normal rate and regular rhythm.     Heart sounds: Normal heart sounds.  Pulmonary:     Effort: Pulmonary effort is normal. No respiratory distress.     Breath sounds: Normal breath sounds. No wheezing or rales.  Chest:      Chest wall: No tenderness.  Abdominal:     General: Bowel sounds are normal. There is no distension or abdominal bruit.     Palpations: Abdomen is soft. There is no hepatomegaly, splenomegaly, mass or pulsatile mass.     Tenderness: There is no abdominal tenderness.  Musculoskeletal:        General: Normal range of motion.     Cervical back: Normal range of motion and neck supple.  Lymphadenopathy:     Cervical: No cervical adenopathy.  Skin:    General: Skin is warm and  dry.  Neurological:     Mental Status: She is alert and oriented to person, place, and time.     Deep Tendon Reflexes: Reflexes are normal and symmetric.  Psychiatric:        Behavior: Behavior normal.        Thought Content: Thought content normal.        Judgment: Judgment normal.     hgba1c 9.6%      Assessment & Plan:  Kylie Ward comes in today with chief complaint of Medical Management of Chronic Issues   Diagnosis and orders addressed:  1. Hyperlipidemia with target LDL less than 100 Low fat diet - Lipid panel - simvastatin (ZOCOR) 80 MG tablet; Take 1 tablet (80 mg total) by mouth daily.  Dispense: 90 tablet; Refill: 1 - fenofibrate 160 MG tablet; Take 1 tablet (160 mg total) by mouth daily.  Dispense: 90 tablet; Refill: 1  2. Essential hypertension, benign Low sodium diet - CMP14+EGFR - CBC with Differential/Platelet - lisinopril (ZESTRIL) 20 MG tablet; Take 1 tablet (20 mg total) by mouth daily.  Dispense: 90 tablet; Refill: 1  3. Type 2 diabetes mellitus with diabetic polyneuropathy, with long-term current use of insulin (HCC) Carb counting Increase lantus to 75u for 3 days then 80u nightly Husband will bring sliding scale in for me to review - Bayer DCA Hb A1c Waived - insulin glargine (LANTUS) 100 UNIT/ML injection; Inject 0.8 mLs (80 Units total) into the skin at bedtime.  Dispense: 80 mL; Refill: 5 - gabapentin (NEURONTIN) 300 MG capsule; Take 1 capsule (300 mg total) by mouth at  bedtime.  Dispense: 90 capsule; Refill: 1 - liraglutide (VICTOZA) 18 MG/3ML SOPN; Inject 0.3 mLs (1.8 mg total) into the skin daily.  Dispense: 27 mL; Refill: 5 - metFORMIN (GLUCOPHAGE-XR) 750 MG 24 hr tablet; Take 1 Tablet by mouth once daily with breakfast  Dispense: 90 tablet; Refill: 1  4. GAD (generalized anxiety disorder) Stress management - ALPRAZolam (XANAX) 1 MG tablet; Take 1 tablet (1 mg total) by mouth 2 (two) times daily.  Dispense: 60 tablet; Refill: 2  5. Peripheral edema lelevate legs wen sitting - furosemide (LASIX) 40 MG tablet; Take 1 tablet (40 mg total) by mouth daily.  Dispense: 90 tablet; Refill: 1  6. Atherosclerosis of aorta ([place]) - Ambulatory referral to Cardiology  7. Late effect of cerebrovascular accident (CVA)  8. BMI 31.0-31.9,adult Discussed diet and exercise for person with BMI >25 Will recheck weight in 3-6 months  9. Urinary urgency - mirabegron ER (MYRBETRIQ) 25 MG TB24 tablet; Take 1 tablet (25 mg total) by mouth daily.  Dispense: 30 tablet; Refill: 5   Labs pending Health Maintenance reviewed Diet and exercise encouraged  Follow up plan: 1 month   [place], FNP

## 2020-01-11 NOTE — Patient Instructions (Signed)
Increase lantus to 75u for 3 days then to 80u

## 2020-01-12 LAB — CMP14+EGFR
ALT: 25 IU/L (ref 0–32)
AST: 19 IU/L (ref 0–40)
Albumin/Globulin Ratio: 1.9 (ref 1.2–2.2)
Albumin: 4.4 g/dL (ref 3.7–4.7)
Alkaline Phosphatase: 93 IU/L (ref 39–117)
BUN/Creatinine Ratio: 17 (ref 12–28)
BUN: 18 mg/dL (ref 8–27)
Bilirubin Total: 0.4 mg/dL (ref 0.0–1.2)
CO2: 23 mmol/L (ref 20–29)
Calcium: 9.9 mg/dL (ref 8.7–10.3)
Chloride: 101 mmol/L (ref 96–106)
Creatinine, Ser: 1.04 mg/dL — ABNORMAL HIGH (ref 0.57–1.00)
GFR calc Af Amer: 62 mL/min/{1.73_m2} (ref >59)
GFR calc non Af Amer: 54 mL/min/{1.73_m2} — ABNORMAL LOW (ref >59)
Globulin, Total: 2.3 g/dL (ref 1.5–4.5)
Glucose: 135 mg/dL — ABNORMAL HIGH (ref 65–99)
Potassium: 5.2 mmol/L (ref 3.5–5.2)
Sodium: 140 mmol/L (ref 134–144)
Total Protein: 6.7 g/dL (ref 6.0–8.5)

## 2020-01-12 LAB — CBC WITH DIFFERENTIAL/PLATELET
Basophils Absolute: 0 10*3/uL (ref 0.0–0.2)
Basos: 0 %
EOS (ABSOLUTE): 0.1 10*3/uL (ref 0.0–0.4)
Eos: 1 %
Hematocrit: 42.1 % (ref 34.0–46.6)
Hemoglobin: 14.5 g/dL (ref 11.1–15.9)
Immature Grans (Abs): 0 10*3/uL (ref 0.0–0.1)
Immature Granulocytes: 0 %
Lymphocytes Absolute: 1.7 10*3/uL (ref 0.7–3.1)
Lymphs: 18 %
MCH: 30.7 pg (ref 26.6–33.0)
MCHC: 34.4 g/dL (ref 31.5–35.7)
MCV: 89 fL (ref 79–97)
Monocytes Absolute: 0.7 10*3/uL (ref 0.1–0.9)
Monocytes: 7 %
Neutrophils Absolute: 6.9 10*3/uL (ref 1.4–7.0)
Neutrophils: 74 %
Platelets: 276 10*3/uL (ref 150–450)
RBC: 4.73 x10E6/uL (ref 3.77–5.28)
RDW: 12.7 % (ref 11.7–15.4)
WBC: 9.5 10*3/uL (ref 3.4–10.8)

## 2020-01-12 LAB — LIPID PANEL
Chol/HDL Ratio: 5.9 ratio — ABNORMAL HIGH (ref 0.0–4.4)
Cholesterol, Total: 247 mg/dL — ABNORMAL HIGH (ref 100–199)
HDL: 42 mg/dL (ref >39)
LDL Chol Calc (NIH): 161 mg/dL — ABNORMAL HIGH (ref 0–99)
Triglycerides: 235 mg/dL — ABNORMAL HIGH (ref 0–149)
VLDL Cholesterol Cal: 44 mg/dL — ABNORMAL HIGH (ref 5–40)

## 2020-01-19 ENCOUNTER — Other Ambulatory Visit: Payer: Self-pay | Admitting: Nurse Practitioner

## 2020-02-08 ENCOUNTER — Ambulatory Visit (INDEPENDENT_AMBULATORY_CARE_PROVIDER_SITE_OTHER): Payer: Medicare Other | Admitting: Nurse Practitioner

## 2020-02-08 ENCOUNTER — Other Ambulatory Visit: Payer: Self-pay

## 2020-02-08 ENCOUNTER — Encounter: Payer: Self-pay | Admitting: Nurse Practitioner

## 2020-02-08 VITALS — BP 107/73 | HR 84 | Temp 98.2°F | Resp 20 | Ht 61.0 in | Wt 159.0 lb

## 2020-02-08 DIAGNOSIS — E1142 Type 2 diabetes mellitus with diabetic polyneuropathy: Secondary | ICD-10-CM

## 2020-02-08 DIAGNOSIS — Z794 Long term (current) use of insulin: Secondary | ICD-10-CM | POA: Diagnosis not present

## 2020-02-08 LAB — BAYER DCA HB A1C WAIVED: HB A1C (BAYER DCA - WAIVED): 8.8 % — ABNORMAL HIGH (ref <7.0)

## 2020-02-08 MED ORDER — FREESTYLE LIBRE 14 DAY SENSOR MISC: 1.0000 | 12 refills | Status: DC

## 2020-02-08 MED ORDER — FREESTYLE LIBRE 14 DAY READER DEVI: 1.0000 | Freq: Every day | 5 refills | Status: DC

## 2020-02-08 NOTE — Progress Notes (Signed)
   Subjective:    Patient ID: Kylie Ward, female    DOB: [birth year], 74 y.o.   MRN: [id number]   Chief Complaint: No chief complaint on file.   HPI Pt is here for diabetes recheck. She was seen on 01/11/20 and HbA1C was 9.6. Increased lantus dose at last visit. Is taking 75 units lantus q hs. Sometimes is not taking novolog at bedtime if blood sugar is not high. Has lost 3.2 pounds since last visit. Is not eating snacks before bed now but is sometimes waking up with low blood sugars. She is checking her blood sugar 4 times a day. Has been taking Victoza a couple of times per day if blood sugars were high. Is watching carb intake in diet.   Review of Systems  Constitutional: Negative.   HENT: Negative.   Eyes: Negative.   Respiratory: Negative.   Cardiovascular: Negative.   Gastrointestinal: Negative.   Genitourinary: Negative.   Musculoskeletal: Negative.   Skin: Negative.   Neurological: Negative.   Psychiatric/Behavioral: Negative.        Objective:   Physical Exam Vitals and nursing note reviewed.  Constitutional:      Appearance: Normal appearance.  HENT:     Head: Normocephalic.  Eyes:     Conjunctiva/sclera: Conjunctivae normal.  Cardiovascular:     Rate and Rhythm: Normal rate and regular rhythm.     Heart sounds: Normal heart sounds.  Pulmonary:     Effort: Pulmonary effort is normal.     Breath sounds: Normal breath sounds.  Abdominal:     General: Bowel sounds are normal.     Palpations: Abdomen is soft.  Musculoskeletal:        General: Normal range of motion.     Cervical back: Normal range of motion and neck supple.  Skin:    General: Skin is warm and dry.  Neurological:     Mental Status: She is alert and oriented to person, place, and time.  Psychiatric:        Behavior: Behavior normal.   BP 107/73   Pulse 84   Temp 98.2 F (36.8 C) (Temporal)   Resp 20   Ht 5\' 1"  (1.549 m)   Wt 159 lb (72.1 kg)   LMP 01/27/1983   SpO2 96%   BMI  30.04 kg/m        Assessment & Plan:  Kylie Ward comes in today with chief complaint of No chief complaint on file.   Diagnosis and orders addressed:  1. Type 2 diabetes mellitus with diabetic polyneuropathy, with long-term current use of insulin (HCC) Increase lantus to 80 units Victoza 0.3 ml once daily in the morning Use humalog based on sliding scale up to 12 units tid ( breakfast , lunch and dinner ) Continue watching carbs - Bayer DCA Hb A1c Waived - Continuous Blood Gluc Receiver (FREESTYLE LIBRE 14 DAY READER) DEVI; 1 each by Does not apply route daily.  Dispense: 2 each; Refill: 5 - Continuous Blood Gluc Sensor (FREESTYLE LIBRE 14 DAY SENSOR) MISC; 1 each by Does not apply route every 14 (fourteen) days.  Dispense: 2 each; Refill: 12   Labs pending Health Maintenance reviewed Diet and exercise encouraged  Follow up plan: 3 months   Mary-Margaret Gardiner Sleeper, FNP

## 2020-02-08 NOTE — Patient Instructions (Signed)
Correction Insulin  Correction insulin, also called corrective insulin or a supplemental dose, is a small amount of insulin that can be used to lower your blood sugar (glucose) if it is too high. You may be instructed to check your blood glucose at certain times of the day and to use correction insulin as needed to lower your blood glucose to your target range. Correction insulin is primarily used as part of diabetes management. It may also be prescribed for people who do not have diabetes. What is a correction scale? A correction scale, also called a sliding scale, is prescribed by your health care provider to help you determine when you need correction insulin. Your correction scale is based on your individual treatment goals, and it has two parts:  Ranges of blood glucose levels.  How much correction insulin to give yourself if your blood sugar falls within a certain range. If your blood glucose is in your desired range, you will not need correction insulin and you should take your normal insulin dose. What type of insulin do I need? Your health care provider may prescribe rapid-acting or short-acting insulin for you to use as correction insulin. Rapid-acting insulin:  Starts working quickly, in as little as 5 minutes.  Can last for 3-6 hours.  Works well when taken right before a meal to quickly lower blood glucose. Short-acting insulin:  Starts working in about 30 minutes.  Can last for 6-8 hours.  Should be taken about 30 minutes before you start eating a meal. Talk with your health care provider or pharmacist about which type of correction insulin to take and when to take it. If you use insulin to control your diabetes, you should use correction insulin in addition to the longer-acting (basal) insulin that you normally use. How do I manage my blood glucose with correction insulin? Giving a correction dose  Check your blood glucose as directed by your health care provider.  Use  your correction scale to find the range that your blood glucose is in.  Identify the units of insulin that match your blood glucose range.  Make sure you have food available that you can eat in the next 15-30 minutes, after your correction dose.  Give yourself the dose of correction insulin that your health care provider has prescribed in your correction scale. Always make sure you are using the right type of insulin. ? If your correction insulin is rapid-acting, start eating a meal within 15 minutes after your correction dose to keep your blood glucose from getting too low. ? If your correction insulin is short-acting, start eating a meal within 30 minutes after your correction dose to keep your blood glucose from getting too low. Keeping a blood glucose log   Write down your blood glucose test results and the amount of insulin that you give yourself. Do this every time you check blood glucose or take insulin. Bring this log with you to your medical visits. This information will help your health care provider to manage your medicines.  Note anything that may affect your blood glucose, such as: ? Changes in normal exercise or activity. ? Changes in your normal schedule, such as changes in your sleep routine, going on vacation, changing your diet, or holidays. ? New over-the-counter or prescription medicines. ? Illness, stress, or anxiety. ? Changes in the time that you took your medicine or insulin. ? Changes in your meals, such as skipping a meal, having a late meal, or dining out. ? Eating  things that may affect blood glucose, such as snacks, meal portions that are larger than normal, drinks that contain sugar, or eating less than usual. What do I need to know about hyperglycemia and hypoglycemia? What is hyperglycemia? Hyperglycemia, also called high blood glucose, occurs when blood glucose is too high. Make sure you know the early signs of hyperglycemia, such as:  Increased thirst.   Hunger.  Feeling very tired.  Needing to urinate more often than usual.  Blurry vision. What is hypoglycemia? Hypoglycemia is also called low blood glucose. Be aware of "stacking" your insulin doses. This happens when you correct a high blood glucose by giving yourself extra insulin too soon after a previous correction dose or mealtime dose. This may cause you to have too much insulin in your body and may put you at risk for hypoglycemia. Hypoglycemia occurs with a blood glucose level at or below 70 mg/dL (3.9 mmol/L). It is important to know the symptoms of hypoglycemia and treat it right away. Always have a 15-gram rapid-acting carbohydrate snack with you to treat low blood glucose. Family members and close friends should also know the symptoms and should understand how to treat hypoglycemia, in case you are not able to treat yourself. What are the symptoms of hypoglycemia? Hypoglycemia symptoms can include:  Hunger.  Anxiety.  Sweating and feeling clammy.  Confusion.  Dizziness or light-headedness.  Sleepiness.  Nausea.  Increased heart rate.  Headache.  Blurry vision.  Jerky movements that you cannot control (seizure).  Nightmares.  Tingling or numbness around the mouth, lips, or tongue.  A change in speech.  Decreased ability to concentrate.  A change in coordination.  Restless sleep.  Tremors or shakes.  Fainting.  Irritability. How do I treat hypoglycemia? If you are alert and able to swallow safely, follow the 15:15 rule:  Take 15 grams of a rapid-acting carbohydrate. Rapid-acting options include: ? 1 tube of glucose gel. ? 3 glucose pills. ? 6-8 pieces of hard candy. ? 4 oz (120 mL) of fruit juice. ? 4 oz (120 mL) of regular (not diet) soda.  Check your blood glucose 15 minutes after you take the carbohydrate. ? If the repeat blood glucose level is still at or below 70 mg/dL (3.9 mmol/L), take 15 grams of a carbohydrate again. ? If your blood  glucose level does not increase above 70 mg/dL (3.9 mmol/L) after 3 tries, seek emergency medical care.  After your blood glucose level returns to normal, eat a meal or a snack within 1 hour.  How do I treat severe hypoglycemia? Severe hypoglycemia is when your blood glucose level is at or below 54 mg/dL (3 mmol/L). Severe hypoglycemia is an emergency. Do not wait to see if the symptoms will go away. Get medical help right away. Call your local emergency services (911 in the [place] Do not drive yourself to the hospital. If you have severe hypoglycemia and you cannot eat or drink, you may need an injection of glucagon. A family member or close friend should learn how to check your blood glucose and how to give you a glucagon injection. Ask your health care provider if you need to have an emergency glucagon injection kit available. Severe hypoglycemia may need to be treated in a hospital. The treatment may include getting glucose through an IV tube. You may also need treatment for the cause of your hypoglycemia. Why do I need correction insulin if I do not have diabetes? If you do not have diabetes, your  health care provider may prescribe insulin because:  Keeping your blood glucose in the target range is important for your overall health.  You are taking medicines that cause your blood glucose to be higher than normal. Contact a health care provider if:  You develop low blood glucose that you are not able to treat yourself.  You have high blood glucose that you are not able to correct with correction insulin.  Your blood glucose is often too low.  You used emergency glucagon to treat low blood glucose. Get help right away if:  You become unresponsive. If this happens, someone else should call emergency services (911 in the [place] right away.  Your blood glucose is lower than 54 mg/dL (3.0 mmol/L).  You become confused or you have trouble thinking clearly.  You have difficulty breathing.  Summary  Correction insulin is a small amount of insulin that can be used to lower your blood sugar (glucose) if it is too high.  Talk with your health care provider or pharmacist about which type of correction insulin to take and when to take it. If you use insulin to control your diabetes, you should use correction insulin in addition to the longer-acting (basal) insulin that you normally use.  You may be instructed to check your blood glucose at certain times of the day and to use correction insulin as needed to lower your blood glucose to your target range. Always keep a log of your blood glucose values and the amount of insulin that you used.  It is important to know the symptoms of hypoglycemia and treat it right away. Always have a 15-gram rapid-acting carbohydrate snack with you to treat low blood glucose. Family members and close friends should also know the symptoms and should understand how to treat hypoglycemia, in case you are not able to treat yourself. This information is not intended to replace advice given to you by your health care provider. Make sure you discuss any questions you have with your health care provider. Document Revised: 10/28/2016 Document Reviewed: 07/16/2016 Elsevier Patient Education  2020 [employer].

## 2020-02-18 ENCOUNTER — Telehealth: Payer: Self-pay | Admitting: Nurse Practitioner

## 2020-02-18 NOTE — Telephone Encounter (Signed)
  Prescription Request  02/18/2020  What is the name of the medication or equipment? Freestyle Libre you take you stick on arm & check your sugars for 14 days.  Have you contacted your pharmacy to request a refill? (if applicable) No  Which pharmacy would you like this sent to? [facility]   Patient notified that their request is being sent to the clinical staff for review and that they should receive a response within 2 business days.   Pt states that it has to come from mail order place & not [employer] will not pay that way.  MMM's pt.

## 2020-02-18 NOTE — Telephone Encounter (Signed)
Someone from [employer] will be contacting patient

## 2020-02-28 ENCOUNTER — Encounter: Payer: Self-pay | Admitting: *Deleted

## 2020-03-12 ENCOUNTER — Ambulatory Visit: Payer: Medicare Other | Admitting: Cardiology

## 2020-05-13 ENCOUNTER — Ambulatory Visit: Payer: Self-pay | Admitting: Nurse Practitioner

## 2020-06-09 ENCOUNTER — Ambulatory Visit: Payer: Self-pay | Admitting: Nurse Practitioner

## 2020-06-19 ENCOUNTER — Other Ambulatory Visit: Payer: Self-pay | Admitting: Nurse Practitioner

## 2020-06-19 DIAGNOSIS — F411 Generalized anxiety disorder: Secondary | ICD-10-CM

## 2020-07-03 ENCOUNTER — Ambulatory Visit: Payer: Self-pay | Admitting: Nurse Practitioner

## 2020-07-09 ENCOUNTER — Ambulatory Visit: Payer: Self-pay | Admitting: Nurse Practitioner

## 2020-08-04 ENCOUNTER — Other Ambulatory Visit: Payer: Self-pay | Admitting: Nurse Practitioner

## 2020-08-19 ENCOUNTER — Encounter: Payer: Self-pay | Admitting: Nurse Practitioner

## 2020-08-19 ENCOUNTER — Ambulatory Visit (INDEPENDENT_AMBULATORY_CARE_PROVIDER_SITE_OTHER): Payer: Medicare Other | Admitting: Nurse Practitioner

## 2020-08-19 DIAGNOSIS — R112 Nausea with vomiting, unspecified: Secondary | ICD-10-CM

## 2020-08-19 DIAGNOSIS — K591 Functional diarrhea: Secondary | ICD-10-CM

## 2020-08-19 DIAGNOSIS — R5081 Fever presenting with conditions classified elsewhere: Secondary | ICD-10-CM | POA: Diagnosis not present

## 2020-08-19 MED ORDER — ONDANSETRON HCL 4 MG PO TABS: 4.0000 mg | ORAL_TABLET | Freq: Three times a day (TID) | ORAL | 0 refills | Status: DC | PRN

## 2020-08-19 NOTE — Progress Notes (Signed)
Virtual Visit via telephone Note Due to COVID-19 pandemic this visit was conducted virtually. This visit type was conducted due to national recommendations for restrictions regarding the COVID-19 Pandemic (e.g. social distancing, sheltering in place) in an effort to limit this patient's exposure and mitigate transmission in our community. All issues noted in this document were discussed and addressed.  A physical exam was not performed with this format.  I connected with Kylie Ward on 08/19/20 at 9:20 by telephone and verified that I am speaking with the correct person using two identifiers. Kylie Ward is currently located at home and her husband is currently with her during visit. The provider, [name], FNP is located in their office at time of visit.  I discussed the limitations, risks, security and privacy concerns of performing an evaluation and management service by telephone and the availability of in person appointments. I also discussed with the patient that there may be a patient responsible charge related to this service. The patient expressed understanding and agreed to proceed.   History and Present Illness:   Chief Complaint: fever  HPI Patient developed vomiting and diarrhea yesterday and developed a temp of over 101 oral yesterday. Today it was 1004. She has not been able to keep liquids down. Has voided some. Her neighbor dx with covid last week. Patient has had both covid vaccine.  * she was suppose to have chronic follow up appointment today but was to sick. Needs xanax filled  Review of Systems  Constitutional: Positive for chills, fever and malaise/fatigue.  HENT: Negative.   Respiratory: Negative.   Cardiovascular: Negative.   Gastrointestinal: Positive for diarrhea (better today) and vomiting (better today).  Neurological: Positive for dizziness and headaches.  Psychiatric/Behavioral: Negative.      Observations/Objective: Alert and  oriented- answers all questions appropriately No distress    Assessment and Plan: Kylie Ward in today with chief complaint of Medical Management of Chronic Issues   1. Fever in other diseases  2. Non-intractable vomiting with nausea, unspecified vomiting type  3. Functional diarrhea  Force fluids Keep close check of blood sugars Bland diet Meds ordered this encounter  Medications  . ondansetron (ZOFRAN) 4 MG tablet    Sig: Take 1 tablet (4 mg total) by mouth every 8 (eight) hours as needed for nausea or vomiting.    Dispense:  20 tablet    Refill:  0    Order Specific Question:   Supervising Provider    Answer:   Nils Pyle [[id number]]      Follow Up Instructions: Rescheduled  For NOvember 2,2021     I discussed the assessment and treatment plan with the patient. The patient was provided an opportunity to ask questions and all were answered. The patient agreed with the plan and demonstrated an understanding of the instructions.   The patient was advised to call back or seek an in-person evaluation if the symptoms worsen or if the condition fails to improve as anticipated.  The above assessment and management plan was discussed with the patient. The patient verbalized understanding of and has agreed to the management plan. Patient is aware to call the clinic if symptoms persist or worsen. Patient is aware when to return to the clinic for a follow-up visit. Patient educated on when it is appropriate to go to the emergency department.   Time call ended:  9:37 I provided 17 minutes of non-face-to-face time during this encounter.    [name], FNP

## 2020-08-20 ENCOUNTER — Telehealth: Payer: Self-pay

## 2020-08-20 ENCOUNTER — Other Ambulatory Visit: Payer: Self-pay | Admitting: Nurse Practitioner

## 2020-08-20 DIAGNOSIS — F411 Generalized anxiety disorder: Secondary | ICD-10-CM

## 2020-08-20 MED ORDER — ALPRAZOLAM 1 MG PO TABS: 1.0000 mg | ORAL_TABLET | Freq: Two times a day (BID) | ORAL | 2 refills | Status: DC

## 2020-08-25 ENCOUNTER — Ambulatory Visit: Payer: Medicare Other | Admitting: Nurse Practitioner

## 2020-09-02 ENCOUNTER — Other Ambulatory Visit: Payer: Self-pay

## 2020-09-02 ENCOUNTER — Other Ambulatory Visit: Payer: Self-pay | Admitting: Nurse Practitioner

## 2020-09-02 ENCOUNTER — Other Ambulatory Visit: Payer: Medicare Other

## 2020-09-02 DIAGNOSIS — E1142 Type 2 diabetes mellitus with diabetic polyneuropathy: Secondary | ICD-10-CM

## 2020-09-02 DIAGNOSIS — I10 Essential (primary) hypertension: Secondary | ICD-10-CM

## 2020-09-02 DIAGNOSIS — E785 Hyperlipidemia, unspecified: Secondary | ICD-10-CM

## 2020-09-02 LAB — BAYER DCA HB A1C WAIVED: HB A1C (BAYER DCA - WAIVED): 10.8 % — ABNORMAL HIGH (ref <7.0)

## 2020-09-03 LAB — LIPID PANEL
Chol/HDL Ratio: 7.6 ratio — ABNORMAL HIGH (ref 0.0–4.4)
Cholesterol, Total: 290 mg/dL — ABNORMAL HIGH (ref 100–199)
HDL: 38 mg/dL — ABNORMAL LOW (ref >39)
LDL Chol Calc (NIH): 176 mg/dL — ABNORMAL HIGH (ref 0–99)
Triglycerides: 388 mg/dL — ABNORMAL HIGH (ref 0–149)
VLDL Cholesterol Cal: 76 mg/dL — ABNORMAL HIGH (ref 5–40)

## 2020-09-03 LAB — CMP14+EGFR
ALT: 20 IU/L (ref 0–32)
AST: 20 IU/L (ref 0–40)
Albumin/Globulin Ratio: 1.7 (ref 1.2–2.2)
Albumin: 4.3 g/dL (ref 3.7–4.7)
Alkaline Phosphatase: 92 IU/L (ref 44–121)
BUN/Creatinine Ratio: 17 (ref 12–28)
BUN: 14 mg/dL (ref 8–27)
Bilirubin Total: 0.3 mg/dL (ref 0.0–1.2)
CO2: 24 mmol/L (ref 20–29)
Calcium: 9.7 mg/dL (ref 8.7–10.3)
Chloride: 101 mmol/L (ref 96–106)
Creatinine, Ser: 0.83 mg/dL (ref 0.57–1.00)
GFR calc Af Amer: 81 mL/min/{1.73_m2} (ref >59)
GFR calc non Af Amer: 71 mL/min/{1.73_m2} (ref >59)
Globulin, Total: 2.6 g/dL (ref 1.5–4.5)
Glucose: 135 mg/dL — ABNORMAL HIGH (ref 65–99)
Potassium: 4.3 mmol/L (ref 3.5–5.2)
Sodium: 139 mmol/L (ref 134–144)
Total Protein: 6.9 g/dL (ref 6.0–8.5)

## 2020-09-03 LAB — CBC WITH DIFFERENTIAL/PLATELET
Basophils Absolute: 0.1 10*3/uL (ref 0.0–0.2)
Basos: 0 %
EOS (ABSOLUTE): 0.1 10*3/uL (ref 0.0–0.4)
Eos: 1 %
Hematocrit: 42.9 % (ref 34.0–46.6)
Hemoglobin: 14.9 g/dL (ref 11.1–15.9)
Immature Grans (Abs): 0 10*3/uL (ref 0.0–0.1)
Immature Granulocytes: 0 %
Lymphocytes Absolute: 2.4 10*3/uL (ref 0.7–3.1)
Lymphs: 20 %
MCH: 31.3 pg (ref 26.6–33.0)
MCHC: 34.7 g/dL (ref 31.5–35.7)
MCV: 90 fL (ref 79–97)
Monocytes Absolute: 0.8 10*3/uL (ref 0.1–0.9)
Monocytes: 7 %
Neutrophils Absolute: 8.6 10*3/uL — ABNORMAL HIGH (ref 1.4–7.0)
Neutrophils: 72 %
Platelets: 265 10*3/uL (ref 150–450)
RBC: 4.76 x10E6/uL (ref 3.77–5.28)
RDW: 13 % (ref 11.7–15.4)
WBC: 11.9 10*3/uL — ABNORMAL HIGH (ref 3.4–10.8)

## 2020-09-12 ENCOUNTER — Telehealth: Payer: Self-pay | Admitting: Nurse Practitioner

## 2020-09-12 NOTE — Telephone Encounter (Signed)
R/C on lab results 

## 2020-09-12 NOTE — Telephone Encounter (Signed)
Patient aware and verbalizes understanding. 

## 2020-09-15 ENCOUNTER — Ambulatory Visit: Payer: Medicare Other | Admitting: Pharmacist

## 2020-10-28 ENCOUNTER — Ambulatory Visit: Payer: Medicare Other | Admitting: Pharmacist

## 2020-10-30 ENCOUNTER — Other Ambulatory Visit: Payer: Self-pay

## 2020-10-30 ENCOUNTER — Ambulatory Visit (INDEPENDENT_AMBULATORY_CARE_PROVIDER_SITE_OTHER): Payer: Medicare Other | Admitting: Pharmacist

## 2020-10-30 DIAGNOSIS — Z794 Long term (current) use of insulin: Secondary | ICD-10-CM

## 2020-10-30 DIAGNOSIS — E1142 Type 2 diabetes mellitus with diabetic polyneuropathy: Secondary | ICD-10-CM | POA: Diagnosis not present

## 2020-10-30 DIAGNOSIS — G720 Drug-induced myopathy: Secondary | ICD-10-CM | POA: Diagnosis not present

## 2020-10-30 MED ORDER — ESCITALOPRAM OXALATE 5 MG PO TABS: 5.0000 mg | ORAL_TABLET | Freq: Every day | ORAL | 3 refills | Status: DC

## 2020-10-30 MED ORDER — METFORMIN HCL ER 750 MG PO TB24: 750.0000 mg | ORAL_TABLET | Freq: Two times a day (BID) | ORAL | 1 refills | Status: DC

## 2020-10-30 MED ORDER — TRULICITY 0.75 MG/0.5ML ~~LOC~~ SOAJ: 0.7500 mg | SUBCUTANEOUS | 0 refills | Status: DC

## 2020-10-30 NOTE — Progress Notes (Signed)
    10/30/2020 Name: Kylie Ward MRN: [id number] DOB: 08-18-1946   S:  74 yoF Presents for diabetes evaluation, education, and management Patient was referred and last seen by Primary Care Provider on 08/19/2020.  Insurance coverage/medication affordability: [employer] medicare  Patient reports adherence with medications. . Current diabetes medications include: lantus 80u, novolog 8-10, metformin . Current hypertension medications include: lisinopril Goal 130/80 . Current hyperlipidemia medications include: simvastatin STATIN ALLERGY/INTOLERANCE DOCUMENTED IN EMR: ICD10: G72   Patient denies hypoglycemic events.   Patient reported dietary habits: Eats 2-3 meals/day Discussed meal planning options and Plate method for healthy eating . Avoid sugary drinks and desserts . Incorporate balanced protein, non starchy veggies, 1 serving of carbohydrate with each meal . Increase water intake . Increase physical activity as able  Patient-reported exercise habits: n/a   O:  Lab Results  Component Value Date   HGBA1C 10.8 (H) 09/02/2020     Lipid Panel     Component Value Date/Time   CHOL 290 (H) 09/02/2020 1556   CHOL 201 (H) 05/11/2013 1735   TRIG 388 (H) 09/02/2020 1556   TRIG 349 (H) 12/20/2014 1500   TRIG 324 (H) 05/11/2013 1735   HDL 38 (L) 09/02/2020 1556   HDL 36 (L) 12/20/2014 1500   HDL 41 05/11/2013 1735   CHOLHDL 7.6 (H) 09/02/2020 1556   LDLCALC 176 (H) 09/02/2020 1556   LDLCALC 56 05/21/2014 1554   LDLCALC 95 05/11/2013 1735    Home fasting blood sugars: 200s  2 hour post-meal/random blood sugars: n/a.   Clinical Atherosclerotic Cardiovascular Disease (ASCVD): Yes   The 10-year ASCVD risk score Denman George DC Jr., et al., 2013) is: 22.7%   Values used to calculate the score:     Age: 74 years     Sex: Female     Is Non-Hispanic African American: No     Diabetic: Yes     Tobacco smoker: No     Systolic Blood Pressure: 107 mmHg     Is BP treated: Yes      HDL Cholesterol: 38 mg/dL     Total Cholesterol: 290 mg/dL    A/P:  Diabetes N5AO currently uncontrolled. Patient is able to verbalize appropriate hypoglycemia management plan. Patient is adherent with medication.    -Patient would benefit form Libre 2 CGM will to [facility] -patient is testing blood sugar 4-6 times daily -patient is injecting insulin at least 3 times daily -patient is making adjustments to insulin based on blood sugar readings -patient would benefit from CGM system (I.e. Libre or Dexcom)   -Add trulicity  -Increase metformin to tiwce daily  -Continue insulin 70 units (Lantus) at bedtime -Continue meal time insulin 3x daily with meals as directed  -Extensively discussed pathophysiology of diabetes, recommended lifestyle interventions, dietary effects on blood sugar control  -Counseled on s/sx of and management of hypoglycemia  -Next A1C anticipated 3 months.   Written patient instructions provided.  Total time in face to face counseling 30 minutes.   Follow up Pharmacist Clinic Visit ON 11/10/20.    Kieth Brightly, PharmD, BCPS Clinical Pharmacist, [facility]  II Phone [phone]

## 2020-11-10 ENCOUNTER — Ambulatory Visit (INDEPENDENT_AMBULATORY_CARE_PROVIDER_SITE_OTHER): Payer: Medicare Other | Admitting: Pharmacist

## 2020-11-10 DIAGNOSIS — E119 Type 2 diabetes mellitus without complications: Secondary | ICD-10-CM

## 2020-11-10 NOTE — Progress Notes (Signed)
    11/10/2020 Name: Kylie Ward MRN: [id number] DOB: 1946/09/24   S:  10 yoF Presents for diabetes evaluation, education, and management Patient was referred and last seen by Primary Care Provider on 08/19/20.  Insurance coverage/medication affordability: [employer] medicare  Patient reports adherence with medications. Non-compliance in the past  Current diabetes medications include: lantus 70u, novolog 8-10, metformin, trulicity (new start)  Current hypertension medications include: lisinopril Goal 130/80  Current hyperlipidemia medications include: simvastatin  Patient denies hypoglycemic events.   Patient reported dietary habits: Eats 2-3  meals/day  Patient-reported exercise habits: n/a  O:  Lab Results  Component Value Date   HGBA1C 10.8 (H) 09/02/2020    Lipid Panel     Component Value Date/Time   CHOL 290 (H) 09/02/2020 1556   CHOL 201 (H) 05/11/2013 1735   TRIG 388 (H) 09/02/2020 1556   TRIG 349 (H) 12/20/2014 1500   TRIG 324 (H) 05/11/2013 1735   HDL 38 (L) 09/02/2020 1556   HDL 36 (L) 12/20/2014 1500   HDL 41 05/11/2013 1735   CHOLHDL 7.6 (H) 09/02/2020 1556   LDLCALC 176 (H) 09/02/2020 1556   LDLCALC 56 05/21/2014 1554   LDLCALC 95 05/11/2013 1735     Home fasting blood sugars: n/a  2 hour post-meal/random blood sugars: n/a.   Clinical Atherosclerotic Cardiovascular Disease (ASCVD): Yes   The 10-year ASCVD risk score Denman George DC Jr., et al., 2013) is: 22.7%   Values used to calculate the score:     Age: 75 years     Sex: Female     Is Non-Hispanic African American: No     Diabetic: Yes     Tobacco smoker: No     Systolic Blood Pressure: 107 mmHg     Is BP treated: Yes     HDL Cholesterol: 38 mg/dL     Total Cholesterol: 290 mg/dL    A/P:  Diabetes Q6PY currently uncontrolled. Patient is able to verbalize appropriate hypoglycemia management plan. Patient is mostly adherent with medication.  She states the trulicity is helping control her  sugars better.     -Increase trulicity to 1.5mg  starting 11/27/20 (patient to complete 4 weeks of trulicity 0.75mg )  Denies history of thyroid cancer  Tolerating well, no reported side effects  RX sent to local pharmacy per patient request  -Decrease lantus to 65 units daily when increasing trulicity dose  -Continue metformin GFR 71  -Continue meal time insulin 3x daily with meals  -Extensively discussed pathophysiology of diabetes, recommended lifestyle interventions, dietary effects on blood sugar control  -Counseled on s/sx of and management of hypoglycemia  -Next A1C anticipated needs appt in 3 months.  Written patient instructions provided.  Total time in counseling 20 minutes.    Kieth Brightly, PharmD, BCPS Clinical Pharmacist, [facility]  II Phone [phone]

## 2020-11-12 MED ORDER — TRULICITY 1.5 MG/0.5ML ~~LOC~~ SOAJ: 1.5000 mg | SUBCUTANEOUS | 3 refills | Status: DC

## 2020-12-09 ENCOUNTER — Other Ambulatory Visit: Payer: Self-pay | Admitting: Nurse Practitioner

## 2020-12-09 DIAGNOSIS — I10 Essential (primary) hypertension: Secondary | ICD-10-CM

## 2020-12-22 ENCOUNTER — Other Ambulatory Visit: Payer: Self-pay | Admitting: Nurse Practitioner

## 2021-01-16 ENCOUNTER — Ambulatory Visit (INDEPENDENT_AMBULATORY_CARE_PROVIDER_SITE_OTHER): Payer: Medicare Other | Admitting: Nurse Practitioner

## 2021-01-16 ENCOUNTER — Other Ambulatory Visit: Payer: Self-pay

## 2021-01-16 ENCOUNTER — Encounter: Payer: Self-pay | Admitting: Nurse Practitioner

## 2021-01-16 ENCOUNTER — Ambulatory Visit (INDEPENDENT_AMBULATORY_CARE_PROVIDER_SITE_OTHER): Payer: Medicare Other

## 2021-01-16 VITALS — BP 101/71 | HR 87 | Temp 98.2°F | Resp 20 | Ht 61.0 in | Wt 166.0 lb

## 2021-01-16 DIAGNOSIS — M8588 Other specified disorders of bone density and structure, other site: Secondary | ICD-10-CM | POA: Diagnosis not present

## 2021-01-16 DIAGNOSIS — Z13820 Encounter for screening for osteoporosis: Secondary | ICD-10-CM

## 2021-01-16 DIAGNOSIS — R609 Edema, unspecified: Secondary | ICD-10-CM | POA: Diagnosis not present

## 2021-01-16 DIAGNOSIS — I700 Atherosclerosis of aorta: Secondary | ICD-10-CM

## 2021-01-16 DIAGNOSIS — I6930 Unspecified sequelae of cerebral infarction: Secondary | ICD-10-CM | POA: Diagnosis not present

## 2021-01-16 DIAGNOSIS — Z794 Long term (current) use of insulin: Secondary | ICD-10-CM

## 2021-01-16 DIAGNOSIS — F411 Generalized anxiety disorder: Secondary | ICD-10-CM

## 2021-01-16 DIAGNOSIS — E785 Hyperlipidemia, unspecified: Secondary | ICD-10-CM | POA: Diagnosis not present

## 2021-01-16 DIAGNOSIS — I10 Essential (primary) hypertension: Secondary | ICD-10-CM

## 2021-01-16 DIAGNOSIS — M810 Age-related osteoporosis without current pathological fracture: Secondary | ICD-10-CM | POA: Diagnosis not present

## 2021-01-16 DIAGNOSIS — I6523 Occlusion and stenosis of bilateral carotid arteries: Secondary | ICD-10-CM

## 2021-01-16 DIAGNOSIS — E1142 Type 2 diabetes mellitus with diabetic polyneuropathy: Secondary | ICD-10-CM

## 2021-01-16 DIAGNOSIS — R3915 Urgency of urination: Secondary | ICD-10-CM | POA: Diagnosis not present

## 2021-01-16 DIAGNOSIS — Z6831 Body mass index (BMI) 31.0-31.9, adult: Secondary | ICD-10-CM

## 2021-01-16 DIAGNOSIS — Z780 Asymptomatic menopausal state: Secondary | ICD-10-CM

## 2021-01-16 LAB — BAYER DCA HB A1C WAIVED: HB A1C (BAYER DCA - WAIVED): 8.6 % — ABNORMAL HIGH (ref <7.0)

## 2021-01-16 MED ORDER — FREESTYLE LIBRE 14 DAY SENSOR MISC: 1.0000 | 12 refills | Status: DC

## 2021-01-16 MED ORDER — MIRABEGRON ER 25 MG PO TB24: 25.0000 mg | ORAL_TABLET | Freq: Every day | ORAL | 5 refills | Status: DC

## 2021-01-16 MED ORDER — METFORMIN HCL ER 750 MG PO TB24
750.0000 mg | ORAL_TABLET | Freq: Two times a day (BID) | ORAL | 1 refills | Status: DC
Start: 2021-01-16 — End: 2021-05-11

## 2021-01-16 MED ORDER — INSULIN GLARGINE 100 UNIT/ML ~~LOC~~ SOLN
65.0000 [IU] | Freq: Every day | SUBCUTANEOUS | 5 refills | Status: DC
Start: 2021-01-16 — End: 2021-05-11

## 2021-01-16 MED ORDER — SIMVASTATIN 80 MG PO TABS: 80.0000 mg | ORAL_TABLET | Freq: Every day | ORAL | 1 refills | Status: DC

## 2021-01-16 MED ORDER — FENOFIBRATE 160 MG PO TABS
160.0000 mg | ORAL_TABLET | Freq: Every day | ORAL | 1 refills | Status: DC
Start: 2021-01-16 — End: 2021-05-11

## 2021-01-16 MED ORDER — FREESTYLE LIBRE 14 DAY READER DEVI: 1.0000 | Freq: Every day | 5 refills | Status: DC

## 2021-01-16 MED ORDER — LISINOPRIL 20 MG PO TABS: 20.0000 mg | ORAL_TABLET | Freq: Every day | ORAL | 0 refills | Status: DC

## 2021-01-16 MED ORDER — TRULICITY 1.5 MG/0.5ML ~~LOC~~ SOAJ: 1.5000 mg | SUBCUTANEOUS | 3 refills | Status: DC

## 2021-01-16 MED ORDER — ESCITALOPRAM OXALATE 5 MG PO TABS: 5.0000 mg | ORAL_TABLET | Freq: Every day | ORAL | 3 refills | Status: DC

## 2021-01-16 MED ORDER — ALPRAZOLAM 1 MG PO TABS: 1.0000 mg | ORAL_TABLET | Freq: Two times a day (BID) | ORAL | 2 refills | Status: DC

## 2021-01-16 MED ORDER — FUROSEMIDE 40 MG PO TABS: 40.0000 mg | ORAL_TABLET | Freq: Every day | ORAL | 1 refills | Status: DC

## 2021-01-16 NOTE — Patient Instructions (Signed)

## 2021-01-16 NOTE — Progress Notes (Signed)
Subjective:    Patient ID: Kylie Ward, female    DOB: Feb 26, 1946, 75 y.o.   MRN: [id number]   Chief Complaint: Medical Management of Chronic Issues    HPI:  1. Type 2 diabetes mellitus with diabetic polyneuropathy, with long-term current use of insulin (HCC) Fasting blood sugars have been runnning around 80-140. Kylie Ward checks Kylie Ward blood sugar 4x a day Kylie Ward has been on trulicity for several weeks and Kylie Ward thinks Kylie Ward is better. We ordered libre for Kylie Ward but Kylie Ward never heard from the pharmacy about that. Lab Results  Component Value Date   HGBA1C 10.8 (H) 09/02/2020     2. Essential hypertension, benign No c/o chest pain, sob or headache. Does not check blood pressure at home. BP Readings from Last 3 Encounters:  01/16/21 101/71  02/08/20 107/73  01/11/20 125/83     3. Hyperlipidemia with target LDL less than 100 Does not watch diet. Says Kylie Ward only eats 1x per day. Does no exercise Lab Results  Component Value Date   CHOL 290 (H) 09/02/2020   HDL 38 (L) 09/02/2020   LDLCALC 176 (H) 09/02/2020   TRIG 388 (H) 09/02/2020   CHOLHDL 7.6 (H) 09/02/2020      4. Symptomatic stenosis of both carotid arteries Last check was less than 50% stnosis  5. Atherosclerosis of aorta (HC Seen on chest xray  6. Late effect of cerebrovascular accident (CVA) No residual effects  7. Peripheral edema Wears compression hose daily.  8. GAD (generalized anxiety disorder) stays anxious  9. BMI 31.0-31.9,adult Kylie Ward has xanax to take 2x a day but only take sit one time a day.    Outpatient Encounter Medications as of 01/16/2021  Medication Sig  . Accu-Chek FastClix Lancets MISC CHECK BLOOD SUGAR UP TO 3 TIMES A DAY AS DIRECTED Dx A21.30  . ALPRAZolam (XANAX) 1 MG tablet Take 1 tablet (1 mg total) by mouth 2 (two) times daily.  Marland Kitchen aspirin 81 MG tablet Take 81 mg by mouth daily.  . Blood Glucose Monitoring Suppl (ACCU-CHEK NANO SMARTVIEW) w/Device KIT CHECK BLOOD SUGER UP TO 3 TIMES A DAY   . Dulaglutide (TRULICITY) 1.5 QM/5.7QI SOPN Inject 1.5 mg into the skin once a week.  . Elastic Bandages & Supports (V-2 HIGH COMPRESSION HOSE) MISC 1 each by Does not apply route daily.  Marland Kitchen escitalopram (LEXAPRO) 5 MG tablet Take 1 tablet (5 mg total) by mouth daily.  . fenofibrate 160 MG tablet Take 1 tablet (160 mg total) by mouth daily.  . furosemide (LASIX) 40 MG tablet Take 1 tablet (40 mg total) by mouth daily.  Marland Kitchen glucose blood (ACCU-CHEK SMARTVIEW) test strip CHECK BLOOD SUGAR UP TO 3 TIMES A DAY AS DIRECTED Dx O96.29  . insulin lispro (HUMALOG) 100 UNIT/ML injection Sliding scale up to 12u TID  . Insulin Pen Needle (SURE COMFORT PEN NEEDLES) 31G X 8 MM MISC USE TO inject lantus ONCE daily  . lisinopril (ZESTRIL) 20 MG tablet Take 1 tablet (20 mg total) by mouth daily. (Needs to be seen before next refill)  . metFORMIN (GLUCOPHAGE-XR) 750 MG 24 hr tablet Take 1 tablet (750 mg total) by mouth 2 (two) times daily with a meal.  . mirabegron ER (MYRBETRIQ) 25 MG TB24 tablet Take 1 tablet (25 mg total) by mouth daily.  . simvastatin (ZOCOR) 80 MG tablet Take 1 tablet (80 mg total) by mouth daily.  . [DISCONTINUED] gabapentin (NEURONTIN) 300 MG capsule Take 1 capsule (300 mg total) by mouth at  bedtime.  . [DISCONTINUED] Magnesium Salicylate (DOANS PILLS PO) Take by mouth.  . [DISCONTINUED] ondansetron (ZOFRAN) 4 MG tablet Take 1 tablet (4 mg total) by mouth every 8 (eight) hours as needed for nausea or vomiting.  Marland Kitchen alendronate (FOSAMAX) 70 MG tablet Take 1 tablet (70 mg total) by mouth once a week. Take with a full glass of water on an empty stomach. (Patient not taking: Reported on 01/16/2021)  . Continuous Blood Gluc Receiver (FREESTYLE LIBRE 14 DAY READER) DEVI 1 each by Does not apply route daily. (Patient not taking: Reported on 01/16/2021)  . Continuous Blood Gluc Sensor (FREESTYLE LIBRE 14 DAY SENSOR) MISC 1 each by Does not apply route every 14 (fourteen) days. (Patient not taking: Reported  on 01/16/2021)  . insulin glargine (LANTUS) 100 UNIT/ML injection Inject 0.8 mLs (80 Units total) into the skin at bedtime. (Patient taking differently: Inject 60 Units into the skin at bedtime.)   No facility-administered encounter medications on file as of 01/16/2021.    Past Surgical History:  Procedure Laterality Date  . CAROTID ENDARTERECTOMY Right   . CAROTID STENT      Family History  Problem Relation Age of Onset  . Heart disease Father   . Heart attack Father   . Hypertension Father   . Diabetes Sister   . Stroke Sister   . Diabetes Brother   . Diabetes Sister   . Diabetes Sister   . Diabetes Brother   . Diabetes Brother   . Diabetes Brother     New complaints: None today  Social history: Lives with Kylie Ward husband  Controlled substance contract: 10/15/19    Review of Systems  Constitutional: Negative for diaphoresis.  Eyes: Negative for pain.  Respiratory: Negative for shortness of breath.   Cardiovascular: Negative for chest pain, palpitations and leg swelling.  Gastrointestinal: Negative for abdominal pain.  Endocrine: Negative for polydipsia.  Skin: Negative for rash.  Neurological: Negative for dizziness, weakness and headaches.  Hematological: Does not bruise/bleed easily.  All other systems reviewed and are negative.      Objective:   Physical Exam Vitals and nursing note reviewed.  Constitutional:      General: Kylie Ward is not in acute distress.    Appearance: Normal appearance. Kylie Ward is well-developed.  HENT:     Head: Normocephalic.     Nose: Nose normal.  Eyes:     Pupils: Pupils are equal, round, and reactive to light.  Neck:     Vascular: No carotid bruit or JVD.  Cardiovascular:     Rate and Rhythm: Normal rate and regular rhythm.     Heart sounds: Normal heart sounds.  Pulmonary:     Effort: Pulmonary effort is normal. No respiratory distress.     Breath sounds: Normal breath sounds. No wheezing or rales.  Chest:     Chest wall: No  tenderness.  Abdominal:     General: Bowel sounds are normal. There is no distension or abdominal bruit.     Palpations: Abdomen is soft. There is no hepatomegaly, splenomegaly, mass or pulsatile mass.     Tenderness: There is no abdominal tenderness.  Musculoskeletal:        General: Normal range of motion.     Cervical back: Normal range of motion and neck supple.  Lymphadenopathy:     Cervical: No cervical adenopathy.  Skin:    General: Skin is warm and dry.  Neurological:     Mental Status: Kylie Ward is alert and oriented to person,  place, and time.     Deep Tendon Reflexes: Reflexes are normal and symmetric.  Psychiatric:        Behavior: Behavior normal.        Thought Content: Thought content normal.        Judgment: Judgment normal.    BP 101/71   Pulse 87   Temp 98.2 F (36.8 C) (Temporal)   Resp 20   Ht '5\' 1"'  (1.549 m)   Wt 166 lb (75.3 kg)   LMP 01/27/1983   SpO2 93%   BMI 31.37 kg/m   hgba1c 8.6      Assessment & Plan:  SHAMAINE MULKERN comes in today with chief complaint of Medical Management of Chronic Issues   Diagnosis and orders addressed:  1. Type 2 diabetes mellitus with diabetic polyneuropathy, with long-term current use of insulin (HCC) Continue current plan- Bayer DCA Hb A1c Waived - Microalbumin / creatinine urine ratio - insulin glargine (LANTUS) 100 UNIT/ML injection; Inject 0.65 mLs (65 Units total) into the skin at bedtime.  Dispense: 80 mL; Refill: 5 - metFORMIN (GLUCOPHAGE-XR) 750 MG 24 hr tablet; Take 1 tablet (750 mg total) by mouth 2 (two) times daily with a meal.  Dispense: 180 tablet; Refill: 1 - Continuous Blood Gluc Receiver (FREESTYLE LIBRE 14 DAY READER) DEVI; 1 each by Does not apply route daily.  Dispense: 2 each; Refill: 5 - Continuous Blood Gluc Sensor (FREESTYLE LIBRE 14 DAY SENSOR) MISC; 1 each by Does not apply route every 14 (fourteen) days.  Dispense: 2 each; Refill: 12 - Dulaglutide (TRULICITY) 1.5 OV/7.0HE SOPN; Inject 1.5  mg into the skin once a week.  Dispense: 2 mL; Refill: 3  2. Essential hypertension, benign Low sodium diet - CBC with Differential/Platelet - CMP14+EGFR - lisinopril (ZESTRIL) 20 MG tablet; Take 1 tablet (20 mg total) by mouth daily. (Needs to be seen before next refill)  Dispense: 30 tablet; Refill: 0  3. Hyperlipidemia with target LDL less than 100 Low fat diet - Lipid panel - simvastatin (ZOCOR) 80 MG tablet; Take 1 tablet (80 mg total) by mouth daily.  Dispense: 90 tablet; Refill: 1 - fenofibrate 160 MG tablet; Take 1 tablet (160 mg total) by mouth daily.  Dispense: 90 tablet; Refill: 1  4. Symptomatic stenosis of both carotid arteries Will continue to monitor  5. Atherosclerosis of aorta ([place])  6. Late effect of cerebrovascular accident (CVA)  7. Peripheral edema Elevate legs when sitting - furosemide (LASIX) 40 MG tablet; Take 1 tablet (40 mg total) by mouth daily.  Dispense: 90 tablet; Refill: 1  8. GAD (generalized anxiety disorder) Stress managemnt - ALPRAZolam (XANAX) 1 MG tablet; Take 1 tablet (1 mg total) by mouth 2 (two) times daily.  Dispense: 60 tablet; Refill: 2 - escitalopram (LEXAPRO) 5 MG tablet; Take 1 tablet (5 mg total) by mouth daily.  Dispense: 30 tablet; Refill: 3  9. BMI 31.0-31.9,adult Discussed diet and exercise for person with BMI >25 Will recheck weight in 3-6 months  10. Urinary urgency - mirabegron ER (MYRBETRIQ) 25 MG TB24 tablet; Take 1 tablet (25 mg total) by mouth daily.  Dispense: 30 tablet; Refill: 5   Labs pending Health Maintenance reviewed Diet and exercise encouraged  Follow up plan: 3 months   Mary-Margaret Hassell Done, FNP

## 2021-01-17 LAB — CMP14+EGFR
ALT: 19 IU/L (ref 0–32)
AST: 20 IU/L (ref 0–40)
Albumin/Globulin Ratio: 1.7 (ref 1.2–2.2)
Albumin: 4.2 g/dL (ref 3.7–4.7)
Alkaline Phosphatase: 81 IU/L (ref 44–121)
BUN/Creatinine Ratio: 16 (ref 12–28)
BUN: 16 mg/dL (ref 8–27)
Bilirubin Total: 0.4 mg/dL (ref 0.0–1.2)
CO2: 24 mmol/L (ref 20–29)
Calcium: 9.4 mg/dL (ref 8.7–10.3)
Chloride: 102 mmol/L (ref 96–106)
Creatinine, Ser: 1.01 mg/dL — ABNORMAL HIGH (ref 0.57–1.00)
Globulin, Total: 2.5 g/dL (ref 1.5–4.5)
Glucose: 61 mg/dL — ABNORMAL LOW (ref 65–99)
Potassium: 5 mmol/L (ref 3.5–5.2)
Sodium: 140 mmol/L (ref 134–144)
Total Protein: 6.7 g/dL (ref 6.0–8.5)
eGFR: 59 mL/min/{1.73_m2} — ABNORMAL LOW (ref >59)

## 2021-01-17 LAB — CBC WITH DIFFERENTIAL/PLATELET
Basophils Absolute: 0 10*3/uL (ref 0.0–0.2)
Basos: 0 %
EOS (ABSOLUTE): 0.2 10*3/uL (ref 0.0–0.4)
Eos: 1 %
Hematocrit: 40.9 % (ref 34.0–46.6)
Hemoglobin: 14.1 g/dL (ref 11.1–15.9)
Immature Grans (Abs): 0 10*3/uL (ref 0.0–0.1)
Immature Granulocytes: 0 %
Lymphocytes Absolute: 2.7 10*3/uL (ref 0.7–3.1)
Lymphs: 24 %
MCH: 31.3 pg (ref 26.6–33.0)
MCHC: 34.5 g/dL (ref 31.5–35.7)
MCV: 91 fL (ref 79–97)
Monocytes Absolute: 0.9 10*3/uL (ref 0.1–0.9)
Monocytes: 8 %
Neutrophils Absolute: 7.5 10*3/uL — ABNORMAL HIGH (ref 1.4–7.0)
Neutrophils: 67 %
Platelets: 248 10*3/uL (ref 150–450)
RBC: 4.51 x10E6/uL (ref 3.77–5.28)
RDW: 12.5 % (ref 11.7–15.4)
WBC: 11.3 10*3/uL — ABNORMAL HIGH (ref 3.4–10.8)

## 2021-01-17 LAB — LIPID PANEL
Chol/HDL Ratio: 5.6 ratio — ABNORMAL HIGH (ref 0.0–4.4)
Cholesterol, Total: 218 mg/dL — ABNORMAL HIGH (ref 100–199)
HDL: 39 mg/dL — ABNORMAL LOW (ref >39)
LDL Chol Calc (NIH): 122 mg/dL — ABNORMAL HIGH (ref 0–99)
Triglycerides: 321 mg/dL — ABNORMAL HIGH (ref 0–149)
VLDL Cholesterol Cal: 57 mg/dL — ABNORMAL HIGH (ref 5–40)

## 2021-01-19 ENCOUNTER — Other Ambulatory Visit: Payer: Self-pay

## 2021-01-19 ENCOUNTER — Telehealth: Payer: Self-pay

## 2021-01-19 DIAGNOSIS — Z794 Long term (current) use of insulin: Secondary | ICD-10-CM

## 2021-01-19 MED ORDER — FREESTYLE LIBRE 14 DAY SENSOR MISC: 1.0000 | 12 refills | Status: DC

## 2021-01-19 MED ORDER — FREESTYLE LIBRE 14 DAY READER DEVI: 1.0000 | Freq: Every day | 5 refills | Status: DC

## 2021-01-19 MED ORDER — ALENDRONATE SODIUM 70 MG PO TABS: 70.0000 mg | ORAL_TABLET | ORAL | 3 refills | Status: DC

## 2021-01-19 MED ORDER — FREESTYLE LIBRE 14 DAY SENSOR MISC
1.0000 | 12 refills | Status: DC
Start: 2021-01-19 — End: 2021-01-19

## 2021-01-19 NOTE — Telephone Encounter (Signed)
Sent rx electronically to [facility]

## 2021-01-19 NOTE — Telephone Encounter (Signed)
Please review. We may can print and send to [facility]

## 2021-01-19 NOTE — Telephone Encounter (Signed)
Print prescription and fax please

## 2021-01-27 ENCOUNTER — Telehealth: Payer: Self-pay | Admitting: *Deleted

## 2021-01-27 NOTE — Telephone Encounter (Signed)
Key: [id number] - PA Case ID: [id number]  Drug FreeStyle Libre 14 Day Reader device Form [employer] Medicare Part D Electronic Prior Authorization Form (2017 NCPDP)  Sent to plan

## 2021-01-28 NOTE — Telephone Encounter (Signed)
Didn't we send this to a speciality pharmacy. I do not understand why they will not pay for this. She is on insulin and sliding scale insulin as well. She checks her blood sugars 3-4 x a day.

## 2021-01-28 NOTE — Telephone Encounter (Signed)
Outcome Denied on March 29 Request Reference Number: [id number]. FREESTYLE MIS READER is denied for not meeting the prior authorization requirement(s). Details of this decision are in the notice attached below or have been faxed to you. Appeals are not supported through ePA. Please refer to the fax case notice for appeals information and instructions.  Drug FreeStyle Libre 14 Day Reader device  Form [employer] Medicare Part D Electronic Prior Authorization Form [phone] NCPDP)

## 2021-01-28 NOTE — Telephone Encounter (Signed)
Contacted [facility] where rx was sent. They are still processing per their representative. Awaiting paperwork from them

## 2021-02-18 ENCOUNTER — Other Ambulatory Visit: Payer: Self-pay | Admitting: Nurse Practitioner

## 2021-02-18 DIAGNOSIS — I10 Essential (primary) hypertension: Secondary | ICD-10-CM

## 2021-03-18 ENCOUNTER — Telehealth: Payer: Self-pay

## 2021-03-18 NOTE — Telephone Encounter (Signed)
I left a message for the patient to return my call in regards to scheduling mammogram  

## 2021-04-21 ENCOUNTER — Encounter: Payer: Self-pay | Admitting: Nurse Practitioner

## 2021-04-21 ENCOUNTER — Ambulatory Visit: Payer: Self-pay | Admitting: Nurse Practitioner

## 2021-05-07 ENCOUNTER — Ambulatory Visit (INDEPENDENT_AMBULATORY_CARE_PROVIDER_SITE_OTHER): Payer: Medicare Other

## 2021-05-07 VITALS — Ht 61.0 in | Wt 166.0 lb

## 2021-05-07 DIAGNOSIS — Z0000 Encounter for general adult medical examination without abnormal findings: Secondary | ICD-10-CM | POA: Diagnosis not present

## 2021-05-07 NOTE — Progress Notes (Signed)
Subjective:   Kylie Ward is a 75 y.o. female who presents for Medicare Annual (Subsequent) preventive examination.  Virtual Visit via Telephone Note  I connected with  LORAE ROIG on 05/07/21 at  3:30 PM EDT by telephone and verified that I am speaking with the correct person using two identifiers.  Location: Patient: Home Provider: [facility] Persons participating in the virtual visit: patient/Nurse Health Advisor   I discussed the limitations, risks, security and privacy concerns of performing an evaluation and management service by telephone and the availability of in person appointments. The patient expressed understanding and agreed to proceed.  Interactive audio and video telecommunications were attempted between this nurse and patient, however failed, due to patient having technical difficulties OR patient did not have access to video capability.  We continued and completed visit with audio only.  Some vital signs may be absent or patient reported.   Onya Eutsler E Damiana Berrian, LPN   Review of Systems     Cardiac Risk Factors include: advanced age (>68mn, >>62women);dyslipidemia;obesity (BMI >30kg/m2);sedentary lifestyle;hypertension;diabetes mellitus     Objective:    Today's Vitals   05/07/21 1513  Weight: 166 lb (75.3 kg)  Height: _0  (1.549 m)  PainSc: 10-Worst pain ever   Body mass index is 31.37 kg/m.  Advanced Directives 05/07/2021 01/10/2020 01/09/2019 12/29/2016 12/05/2015 05/08/2015 09/06/2014  Does Patient Have a Medical Advance Directive? _1  No Yes;No  Would patient like information on creating a medical advance directive? No - Patient declined No - Patient declined Yes (MAU/Ambulatory/Procedural Areas - Information given) Yes (MAU/Ambulatory/Procedural Areas - Information given) No - patient declined information Yes - Educational materials given Yes - Educational materials given    Current Medications (verified) Outpatient Encounter Medications as of  05/07/2021  Medication Sig   Accu-Chek FastClix Lancets MISC CHECK BLOOD SUGAR UP TO 3 TIMES A DAY AS DIRECTED Dx E11.42   alendronate (FOSAMAX) 70 MG tablet Take 1 tablet (70 mg total) by mouth once a week. Take with a full glass of water on an empty stomach.   ALPRAZolam (XANAX) 1 MG tablet Take 1 tablet (1 mg total) by mouth 2 (two) times daily.   aspirin 81 MG tablet Take 81 mg by mouth daily.   Blood Glucose Monitoring Suppl (ACCU-CHEK NANO SMARTVIEW) w/Device KIT CHECK BLOOD SUGER UP TO 3 TIMES A DAY   Continuous Blood Gluc Receiver (FREESTYLE LIBRE 14 DAY READER) DEVI 1 each by Does not apply route daily.   Continuous Blood Gluc Sensor (FREESTYLE LIBRE 14 DAY SENSOR) MISC 1 each by Does not apply route every 14 (fourteen) days.   Dulaglutide (TRULICITY) 1.5 MFR/1.0YTSOPN Inject 1.5 mg into the skin once a week.   Elastic Bandages & Supports (V-2 HIGH COMPRESSION HOSE) MISC 1 each by Does not apply route daily.   escitalopram (LEXAPRO) 5 MG tablet Take 1 tablet (5 mg total) by mouth daily.   fenofibrate 160 MG tablet Take 1 tablet (160 mg total) by mouth daily.   furosemide (LASIX) 40 MG tablet Take 1 tablet (40 mg total) by mouth daily.   glucose blood (ACCU-CHEK SMARTVIEW) test strip CHECK BLOOD SUGAR UP TO 3 TIMES A DAY AS DIRECTED Dx E11.42   insulin glargine (LANTUS) 100 UNIT/ML injection Inject 0.65 mLs (65 Units total) into the skin at bedtime.   insulin lispro (HUMALOG) 100 UNIT/ML injection Sliding scale up to 12u TID   Insulin Pen Needle (SURE COMFORT PEN NEEDLES) 31G X 8 MM  MISC USE TO inject lantus ONCE daily   lisinopril (ZESTRIL) 20 MG tablet TAKE ONE TABLET BY MOUTH EVERY DAY   metFORMIN (GLUCOPHAGE-XR) 750 MG 24 hr tablet Take 1 tablet (750 mg total) by mouth 2 (two) times daily with a meal.   mirabegron ER (MYRBETRIQ) 25 MG TB24 tablet Take 1 tablet (25 mg total) by mouth daily.   simvastatin (ZOCOR) 80 MG tablet Take 1 tablet (80 mg total) by mouth daily.   No  facility-administered encounter medications on file as of 05/07/2021.    Allergies (verified) Lipitor [atorvastatin] and Niaspan [niacin er]   History: Past Medical History:  Diagnosis Date   Anxiety    Diabetes mellitus without complication ([place])    Hyperlipidemia    Hypertension    Stroke [facility])    Past Surgical History:  Procedure Laterality Date   CAROTID ENDARTERECTOMY Right    CAROTID STENT     Family History  Problem Relation Age of Onset   Heart disease Father    Heart attack Father    Hypertension Father    Diabetes Sister    Stroke Sister    Diabetes Brother    Diabetes Sister    Diabetes Sister    Diabetes Brother    Diabetes Brother    Diabetes Brother    Social History   Socioeconomic History   Marital status: Married    Spouse name: Forensic scientist   Number of children: 1   Years of education: 10   Highest education level: 10th grade  Occupational History   Occupation: retired    Fish farm manager: [employer]  Tobacco Use   Smoking status: Former    Packs/day: 1.00    Pack years: 0.00    Types: Cigarettes    Quit date: 12/02/2009    Years since quitting: 11.4   Smokeless tobacco: Never  Vaping Use   Vaping Use: Never used  Substance and Sexual Activity   Alcohol use: No   Drug use: No   Sexual activity: Yes    Birth control/protection: Post-menopausal  Other Topics Concern   Not on file  Social History Narrative   One level living with her husband   Social Determinants of Radio broadcast assistant Strain: Low Risk    Difficulty of Paying Living Expenses: Not hard at all  Food Insecurity: No Food Insecurity   Worried About Charity fundraiser in the Last Year: Never true   [place] in the Last Year: Never true  Transportation Needs: No Transportation Needs   Lack of Transportation (Medical): No   Lack of Transportation (Non-Medical): No  Physical Activity: Inactive   Days of Exercise per Week: 0 days   Minutes of Exercise per Session: 0 min   Stress: Stress Concern Present   Feeling of Stress : Rather much  Social Connections: Socially Isolated   Frequency of Communication with Friends and Family: Once a week   Frequency of Social Gatherings with Friends and Family: Once a week   Attends Religious Services: Never   Marine scientist or Organizations: No   Attends Music therapist: Never   Marital Status: Married    Tobacco Counseling Counseling given: Not Answered   Clinical Intake:  Pre-visit preparation completed: Yes  Pain : 0-10 Pain Score: 10-Worst pain ever Pain Type: Chronic pain Pain Location: Back Pain Descriptors / Indicators: Aching, Sore, Spasm, Discomfort, Dull Pain Onset: More than a month ago Pain Frequency: Constant  BMI - recorded: 31.37 Nutritional Status: BMI > 30  Obese Nutritional Risks: None Diabetes: Yes CBG done?: No Did pt. bring in CBG monitor from home?: No  How often do you need to have someone help you when you read instructions, pamphlets, or other written materials from your doctor or pharmacy?: 1 - Never  Nutrition Risk Assessment:  Has the patient had any N/V/D within the last 2 months?  No  Does the patient have any non-healing wounds?  No  Has the patient had any unintentional weight loss or weight gain?  No   Diabetes:  Is the patient diabetic?  Yes  If diabetic, was a CBG obtained today?  No  Did the patient bring in their glucometer from home?  No  How often do you monitor your CBG's? Once daily fasting - this am 112 per pt.   Financial Strains and Diabetes Management:  Are you having any financial strains with the device, your supplies or your medication? No .  Does the patient want to be seen by Chronic Care Management for management of their diabetes?  No  Would the patient like to be referred to a Nutritionist or for Diabetic Management?  No   Diabetic Exams:  Diabetic Eye Exam: Completed 04/20/2018. Overdue for diabetic eye exam. Pt  has been advised about the importance in completing this exam. They will make appt soon  Diabetic Foot Exam: Completed 01/16/21. Pt has been advised about the importance in completing this exam. Pt is scheduled for diabetic foot exam on next year.    Interpreter Needed?: No  Information entered by :: Oshea Percival, LPN   Activities of Daily Living In your present state of health, do you have any difficulty performing the following activities: 05/07/2021 01/16/2021  Hearing? N N  Vision? N N  Difficulty concentrating or making decisions? N N  Walking or climbing stairs? Y N  Dressing or bathing? N N  Doing errands, shopping? Y N  Preparing Food and eating ? N -  Using the Toilet? N -  In the past six months, have you accidently leaked urine? Y -  Do you have problems with loss of bowel control? N -  Managing your Medications? N -  Managing your Finances? N -  Housekeeping or managing your Housekeeping? N -  Some recent data might be hidden    Patient Care Team: Chevis Pretty, FNP as PCP - General (Nurse Practitioner) Suella Broad, MD as Consulting Physician (Physical Medicine and Rehabilitation) Lavera Guise, [facility] as Pharmacist (Family Medicine)  Indicate any recent Medical Services you may have received from other than [facility] providers in the past year (date may be approximate).     Assessment:   This is a routine wellness examination for Kamoni.  Hearing/Vision screen Hearing Screening - Comments:: Denies hearing difficulties  Vision Screening - Comments:: Wears eyeglasses - behind on annual eye exams - last saw Dr Radford Pax in [place], [place]  Dietary issues and exercise activities discussed: Current Exercise Habits: The patient does not participate in regular exercise at present, Exercise limited by: orthopedic condition(s)   Goals Addressed             This Visit's Progress    DIET - INCREASE WATER INTAKE   Not on track    Try to drink 6-8 glasses of water daily       Exercise 150 min/wk Moderate Activity   Not on track    Walking is a great option.  Depression Screen PHQ 2/9 Scores 05/07/2021 01/16/2021 02/08/2020 01/11/2020 01/10/2020 10/12/2019 02/16/2019  PHQ - 2 Score 2 0 0 5 5 0 0  PHQ- 9 Score 4 - - 8 8 - -    Fall Risk Fall Risk  05/07/2021 01/16/2021 02/08/2020 01/11/2020 01/10/2020  Falls in the past year? 1 0 0 0 0  Number falls in past yr: 0 - - - -  Injury with Fall? 1 - - - -  Risk for fall due to : History of fall(s);Orthopedic patient;Impaired vision;Medication side effect - - - -  Follow up Falls prevention discussed;Education provided - - - -    FALL RISK PREVENTION PERTAINING TO THE HOME:  Any stairs in or around the home? No  If so, are there any without handrails? No  Home free of loose throw rugs in walkways, pet beds, electrical cords, etc? Yes  Adequate lighting in your home to reduce risk of falls? Yes   ASSISTIVE DEVICES UTILIZED TO PREVENT FALLS:  Life alert? No  Use of a cane, walker or w/c? No  Grab bars in the bathroom? No  Shower chair or bench in shower? No  Elevated toilet seat or a handicapped toilet? No   TIMED UP AND GO:  Was the test performed? No . Telephonic visit  Cognitive Function: MMSE - Mini Mental State Exam 01/09/2019 12/29/2016 12/05/2015  Orientation to time _0 Orientation to Place _1 Registration _2 Attention/ Calculation _3 Recall 2 0 1  Language- name 2 objects _4 Language- repeat _5 Language- follow 3 step command _6 Language- read & follow direction _7 Write a sentence _8 Copy design _9 Total score _10 6CIT Screen 05/07/2021 01/10/2020  What Year? 0 points 0 points  What month? 0 points 0 points  What time? 0 points 0 points  Count back from 20 0 points 0 points  Months in reverse 4 points 4 points  Repeat phrase 6 points 8 points  Total Score 10 12    Immunizations Immunization History  Administered Date(s) Administered    Fluad Quad(high Dose 65+) 10/12/2019   Influenza, High Dose Seasonal PF 08/22/2017, 10/02/2018   Influenza,inj,Quad PF,6+ Mos 10/19/2013, 09/06/2014, 08/14/2015, 09/02/2016   Moderna Sars-Covid-2 Vaccination 02/26/2020, 03/25/2020   Pneumococcal Conjugate-13 08/14/2015   Pneumococcal Polysaccharide-23 07/14/2012, 01/09/2019   Td 08/30/2011   Tdap 08/30/2011    TDAP status: Up to date  Flu Vaccine status: Due, Education has been provided regarding the importance of this vaccine. Advised may receive this vaccine at local pharmacy or Health Dept. Aware to provide a copy of the vaccination record if obtained from local pharmacy or Health Dept. Verbalized acceptance and understanding.  Pneumococcal vaccine status: Up to date  Covid-19 vaccine status: Completed vaccines  Qualifies for Shingles Vaccine? Yes   Zostavax completed No   Shingrix Completed?: No.    Education has been provided regarding the importance of this vaccine. Patient has been advised to call insurance company to determine out of pocket expense if they have not yet received this vaccine. Advised may also receive vaccine at local pharmacy or Health Dept. Verbalized acceptance and understanding.  Screening Tests Health Maintenance  Topic Date Due   Zoster Vaccines- Shingrix (1 of 2) Never done   OPHTHALMOLOGY EXAM  04/21/2019   COVID-19 Vaccine (3 -  Booster for Moderna series) 08/25/2020   MAMMOGRAM  01/16/2022 (Originally 04/18/1964)   INFLUENZA VACCINE  06/01/2021   HEMOGLOBIN A1C  07/19/2021   TETANUS/TDAP  08/29/2021   FOOT EXAM  01/16/2022   Fecal DNA (Cologuard)  01/23/2022   DEXA SCAN  01/17/2023   Hepatitis C Screening  Completed   PNA vac Low Risk Adult  Completed   HPV VACCINES  Aged Out    Health Maintenance  Health Maintenance Due  Topic Date Due   Zoster Vaccines- Shingrix (1 of 2) Never done   OPHTHALMOLOGY EXAM  04/21/2019   COVID-19 Vaccine (3 - Booster for Moderna series) 08/25/2020     Colorectal cancer screening: No longer required.   Mammogram status: No longer required due to declines.  Bone Density status: Completed 01/16/21. Results reflect: Bone density results: OSTEOPOROSIS. Repeat every 2 years.  Lung Cancer Screening: (Low Dose CT Chest recommended if Age 2-80 years, 30 pack-year currently smoking OR have quit w/in 15years.) does not qualify.   Additional Screening:  Hepatitis C Screening: does qualify; Completed 12/05/2015  Vision Screening: Recommended annual ophthalmology exams for early detection of glaucoma and other disorders of the eye. Is the patient up to date with their annual eye exam?  No  Who is the provider or what is the name of the office in which the patient attends annual eye exams? [facility], [place] If pt is not established with a provider, would they like to be referred to a provider to establish care? No .   Dental Screening: Recommended annual dental exams for proper oral hygiene  Community Resource Referral / Chronic Care Management: CRR required this visit?  No   CCM required this visit?  No      Plan:     I have personally reviewed and noted the following in the patient's chart:   Medical and social history Use of alcohol, tobacco or illicit drugs  Current medications and supplements including opioid prescriptions.  Functional ability and status Nutritional status Physical activity Advanced directives List of other physicians Hospitalizations, surgeries, and ER visits in previous 12 months Vitals Screenings to include cognitive, depression, and falls Referrals and appointments  In addition, I have reviewed and discussed with patient certain preventive protocols, quality metrics, and best practice recommendations. A written personalized care plan for preventive services as well as general preventive health recommendations were provided to patient.     Sandrea Hammond, LPN   03/08/3219   Nurse Notes:  None

## 2021-05-07 NOTE — Patient Instructions (Signed)
Kylie Ward , Thank you for taking time to come for your Medicare Wellness Visit. I appreciate your ongoing commitment to your health goals. Please review the following plan we discussed and let me know if I can assist you in the future.   Screening recommendations/referrals: Colonoscopy: Cologuard done 2020 - Need Colonoscopy - Schedule soon! Mammogram: Due every year Bone Density: Done 01/16/21 - Repeat every 2 years  Recommended yearly ophthalmology/optometry visit for glaucoma screening and checkup Recommended yearly dental visit for hygiene and checkup  Vaccinations: Influenza vaccine: Due every fall Pneumococcal vaccine: Done 08/14/2015 & 01/09/2019 Tdap vaccine: Done 08/30/2011 - Repeat in 10 years  Shingles vaccine: Due. Shingrix discussed. Please contact your pharmacy for coverage information.     Covid-19: Done 02/26/20 & 03/25/20 - need booster date  Advanced directives: Advance directive discussed with you today. Even though you declined this today, please call our office should you change your mind, and we can give you the proper paperwork for you to fill out.   Conditions/risks identified: Aim for 30 minutes of exercise or brisk walking each day, drink 6-8 glasses of water and eat lots of fruits and vegetables.  Next appointment: Follow up in one year for your annual wellness visit    Preventive Care 65 Years and Older, Female Preventive care refers to lifestyle choices and visits with your health care provider that can promote health and wellness. What does preventive care include? A yearly physical exam. This is also called an annual well check. Dental exams once or twice a year. Routine eye exams. Ask your health care provider how often you should have your eyes checked. Personal lifestyle choices, including: Daily care of your teeth and gums. Regular physical activity. Eating a healthy diet. Avoiding tobacco and drug use. Limiting alcohol use. Practicing safe  sex. Taking low-dose aspirin every day. Taking vitamin and mineral supplements as recommended by your health care provider. What happens during an annual well check? The services and screenings done by your health care provider during your annual well check will depend on your age, overall health, lifestyle risk factors, and family history of disease. Counseling  Your health care provider may ask you questions about your: Alcohol use. Tobacco use. Drug use. Emotional well-being. Home and relationship well-being. Sexual activity. Eating habits. History of falls. Memory and ability to understand (cognition). Work and work Astronomer. Reproductive health. Screening  You may have the following tests or measurements: Height, weight, and BMI. Blood pressure. Lipid and cholesterol levels. These may be checked every 5 years, or more frequently if you are over 95 years old. Skin check. Lung cancer screening. You may have this screening every year starting at age 55 if you have a 30-pack-year history of smoking and currently smoke or have quit within the past 15 years. Fecal occult blood test (FOBT) of the stool. You may have this test every year starting at age 38. Flexible sigmoidoscopy or colonoscopy. You may have a sigmoidoscopy every 5 years or a colonoscopy every 10 years starting at age 86. Hepatitis C blood test. Hepatitis B blood test. Sexually transmitted disease (STD) testing. Diabetes screening. This is done by checking your blood sugar (glucose) after you have not eaten for a while (fasting). You may have this done every 1-3 years. Bone density scan. This is done to screen for osteoporosis. You may have this done starting at age 75. Mammogram. This may be done every 1-2 years. Talk to your health care provider about how often you  should have regular mammograms. Talk with your health care provider about your test results, treatment options, and if necessary, the need for more  tests. Vaccines  Your health care provider may recommend certain vaccines, such as: Influenza vaccine. This is recommended every year. Tetanus, diphtheria, and acellular pertussis (Tdap, Td) vaccine. You may need a Td booster every 10 years. Zoster vaccine. You may need this after age 22. Pneumococcal 13-valent conjugate (PCV13) vaccine. One dose is recommended after age 14. Pneumococcal polysaccharide (PPSV23) vaccine. One dose is recommended after age 65. Talk to your health care provider about which screenings and vaccines you need and how often you need them. This information is not intended to replace advice given to you by your health care provider. Make sure you discuss any questions you have with your health care provider. Document Released: 11/14/2015 Document Revised: 07/07/2016 Document Reviewed: 08/19/2015 Elsevier Interactive Patient Education  2017 [employer] Prevention in the Home Falls can cause injuries. They can happen to people of all ages. There are many things you can do to make your home safe and to help prevent falls. What can I do on the outside of my home? Regularly fix the edges of walkways and driveways and fix any cracks. Remove anything that might make you trip as you walk through a door, such as a raised step or threshold. Trim any bushes or trees on the path to your home. Use bright outdoor lighting. Clear any walking paths of anything that might make someone trip, such as rocks or tools. Regularly check to see if handrails are loose or broken. Make sure that both sides of any steps have handrails. Any raised decks and porches should have guardrails on the edges. Have any leaves, snow, or ice cleared regularly. Use sand or salt on walking paths during winter. Clean up any spills in your garage right away. This includes oil or grease spills. What can I do in the bathroom? Use night lights. Install grab bars by the toilet and in the tub and shower.  Do not use towel bars as grab bars. Use non-skid mats or decals in the tub or shower. If you need to sit down in the shower, use a plastic, non-slip stool. Keep the floor dry. Clean up any water that spills on the floor as soon as it happens. Remove soap buildup in the tub or shower regularly. Attach bath mats securely with double-sided non-slip rug tape. Do not have throw rugs and other things on the floor that can make you trip. What can I do in the bedroom? Use night lights. Make sure that you have a light by your bed that is easy to reach. Do not use any sheets or blankets that are too big for your bed. They should not hang down onto the floor. Have a firm chair that has side arms. You can use this for support while you get dressed. Do not have throw rugs and other things on the floor that can make you trip. What can I do in the kitchen? Clean up any spills right away. Avoid walking on wet floors. Keep items that you use a lot in easy-to-reach places. If you need to reach something above you, use a strong step stool that has a grab bar. Keep electrical cords out of the way. Do not use floor polish or wax that makes floors slippery. If you must use wax, use non-skid floor wax. Do not have throw rugs and other things on the  floor that can make you trip. What can I do with my stairs? Do not leave any items on the stairs. Make sure that there are handrails on both sides of the stairs and use them. Fix handrails that are broken or loose. Make sure that handrails are as long as the stairways. Check any carpeting to make sure that it is firmly attached to the stairs. Fix any carpet that is loose or worn. Avoid having throw rugs at the top or bottom of the stairs. If you do have throw rugs, attach them to the floor with carpet tape. Make sure that you have a light switch at the top of the stairs and the bottom of the stairs. If you do not have them, ask someone to add them for you. What else  can I do to help prevent falls? Wear shoes that: Do not have high heels. Have rubber bottoms. Are comfortable and fit you well. Are closed at the toe. Do not wear sandals. If you use a stepladder: Make sure that it is fully opened. Do not climb a closed stepladder. Make sure that both sides of the stepladder are locked into place. Ask someone to hold it for you, if possible. Clearly mark and make sure that you can see: Any grab bars or handrails. First and last steps. Where the edge of each step is. Use tools that help you move around (mobility aids) if they are needed. These include: Canes. Walkers. Scooters. Crutches. Turn on the lights when you go into a dark area. Replace any light bulbs as soon as they burn out. Set up your furniture so you have a clear path. Avoid moving your furniture around. If any of your floors are uneven, fix them. If there are any pets around you, be aware of where they are. Review your medicines with your doctor. Some medicines can make you feel dizzy. This can increase your chance of falling. Ask your doctor what other things that you can do to help prevent falls. This information is not intended to replace advice given to you by your health care provider. Make sure you discuss any questions you have with your health care provider. Document Released: 08/14/2009 Document Revised: 03/25/2016 Document Reviewed: 11/22/2014 Elsevier Interactive Patient Education  2017 [employer].

## 2021-05-08 ENCOUNTER — Ambulatory Visit: Payer: Self-pay | Admitting: Nurse Practitioner

## 2021-05-11 ENCOUNTER — Ambulatory Visit (INDEPENDENT_AMBULATORY_CARE_PROVIDER_SITE_OTHER): Payer: Medicare Other | Admitting: Nurse Practitioner

## 2021-05-11 ENCOUNTER — Other Ambulatory Visit: Payer: Self-pay

## 2021-05-11 ENCOUNTER — Encounter: Payer: Self-pay | Admitting: Nurse Practitioner

## 2021-05-11 VITALS — BP 84/61 | HR 84 | Temp 98.0°F | Resp 20 | Ht 61.0 in | Wt 167.0 lb

## 2021-05-11 DIAGNOSIS — I6930 Unspecified sequelae of cerebral infarction: Secondary | ICD-10-CM

## 2021-05-11 DIAGNOSIS — I10 Essential (primary) hypertension: Secondary | ICD-10-CM | POA: Diagnosis not present

## 2021-05-11 DIAGNOSIS — R079 Chest pain, unspecified: Secondary | ICD-10-CM

## 2021-05-11 DIAGNOSIS — Z794 Long term (current) use of insulin: Secondary | ICD-10-CM

## 2021-05-11 DIAGNOSIS — Z6831 Body mass index (BMI) 31.0-31.9, adult: Secondary | ICD-10-CM

## 2021-05-11 DIAGNOSIS — E785 Hyperlipidemia, unspecified: Secondary | ICD-10-CM | POA: Diagnosis not present

## 2021-05-11 DIAGNOSIS — I6523 Occlusion and stenosis of bilateral carotid arteries: Secondary | ICD-10-CM

## 2021-05-11 DIAGNOSIS — R609 Edema, unspecified: Secondary | ICD-10-CM | POA: Diagnosis not present

## 2021-05-11 DIAGNOSIS — E1142 Type 2 diabetes mellitus with diabetic polyneuropathy: Secondary | ICD-10-CM | POA: Diagnosis not present

## 2021-05-11 DIAGNOSIS — F411 Generalized anxiety disorder: Secondary | ICD-10-CM

## 2021-05-11 DIAGNOSIS — I700 Atherosclerosis of aorta: Secondary | ICD-10-CM | POA: Diagnosis not present

## 2021-05-11 LAB — BAYER DCA HB A1C WAIVED: HB A1C (BAYER DCA - WAIVED): 9.2 % — ABNORMAL HIGH (ref <7.0)

## 2021-05-11 MED ORDER — SIMVASTATIN 80 MG PO TABS: 80.0000 mg | ORAL_TABLET | Freq: Every day | ORAL | 1 refills | Status: DC

## 2021-05-11 MED ORDER — TRULICITY 1.5 MG/0.5ML ~~LOC~~ SOAJ: 1.5000 mg | SUBCUTANEOUS | 3 refills | Status: DC

## 2021-05-11 MED ORDER — ALENDRONATE SODIUM 70 MG PO TABS: 70.0000 mg | ORAL_TABLET | ORAL | 3 refills | Status: DC

## 2021-05-11 MED ORDER — FUROSEMIDE 40 MG PO TABS: 40.0000 mg | ORAL_TABLET | Freq: Every day | ORAL | 1 refills | Status: DC

## 2021-05-11 MED ORDER — INSULIN GLARGINE 100 UNIT/ML ~~LOC~~ SOLN: 65.0000 [IU] | Freq: Every day | SUBCUTANEOUS | 5 refills | Status: DC

## 2021-05-11 MED ORDER — LISINOPRIL 20 MG PO TABS: 20.0000 mg | ORAL_TABLET | Freq: Every day | ORAL | 5 refills | Status: DC

## 2021-05-11 MED ORDER — METFORMIN HCL ER 750 MG PO TB24: 750.0000 mg | ORAL_TABLET | Freq: Two times a day (BID) | ORAL | 1 refills | Status: DC

## 2021-05-11 MED ORDER — HUMALOG 100 UNIT/ML ~~LOC~~ SOLN: SUBCUTANEOUS | 11 refills | Status: DC

## 2021-05-11 MED ORDER — ALPRAZOLAM 1 MG PO TABS: 1.0000 mg | ORAL_TABLET | Freq: Two times a day (BID) | ORAL | 2 refills | Status: DC

## 2021-05-11 MED ORDER — ESCITALOPRAM OXALATE 5 MG PO TABS: 5.0000 mg | ORAL_TABLET | Freq: Every day | ORAL | 3 refills | Status: DC

## 2021-05-11 MED ORDER — FENOFIBRATE 160 MG PO TABS: 160.0000 mg | ORAL_TABLET | Freq: Every day | ORAL | 1 refills | Status: DC

## 2021-05-11 NOTE — Addendum Note (Signed)
Addended by: Cleda Daub on: 05/11/2021 04:29 PM   Modules accepted: Orders

## 2021-05-11 NOTE — Progress Notes (Addendum)
Subjective:    Patient ID: Kylie Ward, female    DOB: 12/12/1945, 75 y.o.   MRN: [id number]   Chief Complaint: medical management of chronic issues     HPI:  1. Essential hypertension, benign Patient states she had chest pain 2 nights in a row with SOB. She refused to go to the ED. Pain resolved on its own. BP Readings from Last 3 Encounters:  01/16/21 101/71  02/08/20 107/73  01/11/20 125/83     2. Atherosclerosis of aorta (HCC) Seen on chest xray- no SOB or headache.  3. Symptomatic stenosis of both carotid arteries No doppler report on chart. Will talk to patient about having it done.  4. Type 2 diabetes mellitus with diabetic polyneuropathy, with long-term current use of insulin (HCC) Fasting blood sugars have been up and down sometime high and sometimes normal. She does not watch diet very closely.is taking meds as prescribed. If she skips a meal then she doe snot do her sliding scale. Lab Results  Component Value Date   HGBA1C 8.6 (H) 01/16/2021     5. Hyperlipidemia with target LDL less than 100 Does not watch diet and does no dedicated exercise. Is on zocor 21m. Says lipitor causes muscle aches. Lab Results  Component Value Date   CHOL 218 (H) 01/16/2021   HDL 39 (L) 01/16/2021   LDLCALC 122 (H) 01/16/2021   TRIG 321 (H) 01/16/2021   CHOLHDL 5.6 (H) 01/16/2021     6. GAD (generalized anxiety disorder) Is on xanax BID_ takes daily as prescribed  7. Late effect of cerebrovascular accident (CVA) No permanent effects from CVA.  8. Peripheral edema Has swelling by the end of each day.  9. BMI 31.0-31.9,adult No recent weight changes Wt Readings from Last 3 Encounters:  05/11/21 167 lb (75.8 kg)  05/07/21 166 lb (75.3 kg)  01/16/21 166 lb (75.3 kg)   BMI Readings from Last 3 Encounters:  05/11/21 31.55 kg/m  05/07/21 31.37 kg/m  01/16/21 31.37 kg/m      Outpatient Encounter Medications as of 05/11/2021  Medication Sig   Accu-Chek  FastClix Lancets MISC CHECK BLOOD SUGAR UP TO 3 TIMES A DAY AS DIRECTED Dx E11.42   alendronate (FOSAMAX) 70 MG tablet Take 1 tablet (70 mg total) by mouth once a week. Take with a full glass of water on an empty stomach.   ALPRAZolam (XANAX) 1 MG tablet Take 1 tablet (1 mg total) by mouth 2 (two) times daily.   aspirin 81 MG tablet Take 81 mg by mouth daily.   Blood Glucose Monitoring Suppl (ACCU-CHEK NANO SMARTVIEW) w/Device KIT CHECK BLOOD SUGER UP TO 3 TIMES A DAY   Continuous Blood Gluc Receiver (FREESTYLE LIBRE 14 DAY READER) DEVI 1 each by Does not apply route daily.   Continuous Blood Gluc Sensor (FREESTYLE LIBRE 14 DAY SENSOR) MISC 1 each by Does not apply route every 14 (fourteen) days.   Dulaglutide (TRULICITY) 1.5 MTJ/0.3ESSOPN Inject 1.5 mg into the skin once a week.   Elastic Bandages & Supports (V-2 HIGH COMPRESSION HOSE) MISC 1 each by Does not apply route daily.   escitalopram (LEXAPRO) 5 MG tablet Take 1 tablet (5 mg total) by mouth daily.   fenofibrate 160 MG tablet Take 1 tablet (160 mg total) by mouth daily.   furosemide (LASIX) 40 MG tablet Take 1 tablet (40 mg total) by mouth daily.   glucose blood (ACCU-CHEK SMARTVIEW) test strip CHECK BLOOD SUGAR UP TO 3 TIMES A DAY  AS DIRECTED Dx E11.42   insulin glargine (LANTUS) 100 UNIT/ML injection Inject 0.65 mLs (65 Units total) into the skin at bedtime.   insulin lispro (HUMALOG) 100 UNIT/ML injection Sliding scale up to 12u TID   Insulin Pen Needle (SURE COMFORT PEN NEEDLES) 31G X 8 MM MISC USE TO inject lantus ONCE daily   lisinopril (ZESTRIL) 20 MG tablet TAKE ONE TABLET BY MOUTH EVERY DAY   metFORMIN (GLUCOPHAGE-XR) 750 MG 24 hr tablet Take 1 tablet (750 mg total) by mouth 2 (two) times daily with a meal.   mirabegron ER (MYRBETRIQ) 25 MG TB24 tablet Take 1 tablet (25 mg total) by mouth daily.   simvastatin (ZOCOR) 80 MG tablet Take 1 tablet (80 mg total) by mouth daily.   No facility-administered encounter medications on  file as of 05/11/2021.    Past Surgical History:  Procedure Laterality Date   CAROTID ENDARTERECTOMY Right    CAROTID STENT      Family History  Problem Relation Age of Onset   Heart disease Father    Heart attack Father    Hypertension Father    Diabetes Sister    Stroke Sister    Diabetes Brother    Diabetes Sister    Diabetes Sister    Diabetes Brother    Diabetes Brother    Diabetes Brother     New complaints: -None other then chest pain episodes - Chronic back pain. Has had for several years. Has gotten worse the last 2 months. She rated pain 10/10. Doe snot know what make sit better or worse. Her husband says cleaning and running vaccuum increases pain. She has been taking arthritis strength tylenol.  Social history: Lives with husband  Controlled substance contract: 05/11/21   Review of Systems  Constitutional:  Negative for diaphoresis.  Eyes:  Negative for pain.  Respiratory:  Negative for shortness of breath.   Cardiovascular:  Negative for chest pain, palpitations and leg swelling.  Gastrointestinal:  Negative for abdominal pain.  Endocrine: Negative for polydipsia.  Skin:  Negative for rash.  Neurological:  Negative for dizziness, weakness and headaches.  Hematological:  Does not bruise/bleed easily.  All other systems reviewed and are negative.     Objective:   Physical Exam Vitals and nursing note reviewed.  Constitutional:      General: She is not in acute distress.    Appearance: Normal appearance. She is well-developed.  HENT:     Head: Normocephalic.     Right Ear: Tympanic membrane normal.     Left Ear: Tympanic membrane normal.     Nose: Nose normal.     Mouth/Throat:     Mouth: Mucous membranes are moist.  Eyes:     Pupils: Pupils are equal, round, and reactive to light.  Neck:     Vascular: No carotid bruit or JVD.  Cardiovascular:     Rate and Rhythm: Normal rate and regular rhythm.     Heart sounds: Murmur (2/6) heard.   Pulmonary:     Effort: Pulmonary effort is normal. No respiratory distress.     Breath sounds: Normal breath sounds. No wheezing or rales.  Chest:     Chest wall: No tenderness.  Abdominal:     General: Bowel sounds are normal. There is no distension or abdominal bruit.     Palpations: Abdomen is soft. There is no hepatomegaly, splenomegaly, mass or pulsatile mass.     Tenderness: There is no abdominal tenderness.  Musculoskeletal:  General: Normal range of motion.     Cervical back: Normal range of motion and neck supple.     Right lower leg: Edema (2+) present.     Left lower leg: Edema (2+) present.  Lymphadenopathy:     Cervical: No cervical adenopathy.  Skin:    General: Skin is warm and dry.  Neurological:     Mental Status: She is alert and oriented to person, place, and time.     Deep Tendon Reflexes: Reflexes are normal and symmetric.  Psychiatric:        Behavior: Behavior normal.        Thought Content: Thought content normal.        Judgment: Judgment normal.    BP (!) 84/61   Pulse 84   Temp 98 F (36.7 C) (Temporal)   Resp 20   Ht _0  (1.549 m)   Wt 167 lb (75.8 kg)   LMP 01/27/1983   SpO2 95%   BMI 31.55 kg/m   Hgab1c 9.2% EKG- SInus rhythym with Kylie Condon, FNP     Assessment & Plan:  ZAMARIYAH FURUKAWA comes in today with chief complaint of Medical Management of Chronic Issues   Diagnosis and orders addressed:  1. Essential hypertension, benign Low sodium diet - CBC with Differential/Platelet - CMP14+EGFR - lisinopril (ZESTRIL) 20 MG tablet; Take 1 tablet (20 mg total) by mouth daily.  Dispense: 30 tablet; Refill: 5  2. Atherosclerosis of aorta [facility]) To see cardiology - Lipid panel - Ambulatory referral to Cardiology  3. Symptomatic stenosis of both carotid arteries Will  discuss at cardiology visit  4. Type 2 diabetes mellitus with diabetic polyneuropathy, with long-term current use of insulin (HCC) Strict carb  counting- referral to J. Pruitt, clinical pharmacist - [facility] DCA Hb A1c Waived - Microalbumin / creatinine urine ratio - metFORMIN (GLUCOPHAGE-XR) 750 MG 24 hr tablet; Take 1 tablet (750 mg total) by mouth 2 (two) times daily with a meal.  Dispense: 180 tablet; Refill: 1 - insulin glargine (LANTUS) 100 UNIT/ML injection; Inject 0.65 mLs (65 Units total) into the skin at bedtime.  Dispense: 80 mL; Refill: 5 - Dulaglutide (TRULICITY) 1.5 VU/1.3HY SOPN; Inject 1.5 mg into the skin once a week.  Dispense: 2 mL; Refill: 3 - insulin lispro (HUMALOG) 100 UNIT/ML injection; Sliding scale up to 12u TID  Dispense: 10 mL; Refill: 11  5. Hyperlipidemia with target LDL less than 100 Low fat diet - simvastatin (ZOCOR) 80 MG tablet; Take 1 tablet (80 mg total) by mouth daily.  Dispense: 90 tablet; Refill: 1 - fenofibrate 160 MG tablet; Take 1 tablet (160 mg total) by mouth daily.  Dispense: 90 tablet; Refill: 1  6. GAD (generalized anxiety disorder) Stress management - escitalopram (LEXAPRO) 5 MG tablet; Take 1 tablet (5 mg total) by mouth daily.  Dispense: 30 tablet; Refill: 3 - ALPRAZolam (XANAX) 1 MG tablet; Take 1 tablet (1 mg total) by mouth 2 (two) times daily.  Dispense: 60 tablet; Refill: 2  7. Late effect of cerebrovascular accident (CVA)  8. Peripheral edema Elevate legs when sitting - furosemide (LASIX) 40 MG tablet; Take 1 tablet (40 mg total) by mouth daily.  Dispense: 90 tablet; Refill: 1  9. BMI 31.0-31.9,adult Discussed diet and exercise for person with BMI >25 Will recheck weight in 3-6 months  10. Chest pain, unspecified type Referral to cardiology - EKG 12-Lead  11. Chronic back pain Continue extra strength tylenol Cannot do pain meds if going to stay  on xanax  Labs pending Health Maintenance reviewed Diet and exercise encouraged  Follow up plan: 3 months   Mary-Margaret Hassell Done, FNP

## 2021-05-12 LAB — CBC WITH DIFFERENTIAL/PLATELET
Basophils Absolute: 0 10*3/uL (ref 0.0–0.2)
Basos: 0 %
EOS (ABSOLUTE): 0.2 10*3/uL (ref 0.0–0.4)
Eos: 1 %
Hematocrit: 42.1 % (ref 34.0–46.6)
Hemoglobin: 14.4 g/dL (ref 11.1–15.9)
Immature Grans (Abs): 0 10*3/uL (ref 0.0–0.1)
Immature Granulocytes: 0 %
Lymphocytes Absolute: 2.3 10*3/uL (ref 0.7–3.1)
Lymphs: 20 %
MCH: 31.1 pg (ref 26.6–33.0)
MCHC: 34.2 g/dL (ref 31.5–35.7)
MCV: 91 fL (ref 79–97)
Monocytes Absolute: 0.9 10*3/uL (ref 0.1–0.9)
Monocytes: 7 %
Neutrophils Absolute: 8.4 10*3/uL — ABNORMAL HIGH (ref 1.4–7.0)
Neutrophils: 72 %
Platelets: 237 10*3/uL (ref 150–450)
RBC: 4.63 x10E6/uL (ref 3.77–5.28)
RDW: 12.7 % (ref 11.7–15.4)
WBC: 11.8 10*3/uL — ABNORMAL HIGH (ref 3.4–10.8)

## 2021-05-12 LAB — CMP14+EGFR
ALT: 27 IU/L (ref 0–32)
AST: 22 IU/L (ref 0–40)
Albumin/Globulin Ratio: 1.6 (ref 1.2–2.2)
Albumin: 4.2 g/dL (ref 3.7–4.7)
Alkaline Phosphatase: 87 IU/L (ref 44–121)
BUN/Creatinine Ratio: 16 (ref 12–28)
BUN: 15 mg/dL (ref 8–27)
Bilirubin Total: 0.5 mg/dL (ref 0.0–1.2)
CO2: 24 mmol/L (ref 20–29)
Calcium: 9.8 mg/dL (ref 8.7–10.3)
Chloride: 98 mmol/L (ref 96–106)
Creatinine, Ser: 0.92 mg/dL (ref 0.57–1.00)
Globulin, Total: 2.6 g/dL (ref 1.5–4.5)
Glucose: 149 mg/dL — ABNORMAL HIGH (ref 65–99)
Potassium: 4.5 mmol/L (ref 3.5–5.2)
Sodium: 140 mmol/L (ref 134–144)
Total Protein: 6.8 g/dL (ref 6.0–8.5)
eGFR: 66 mL/min/{1.73_m2} (ref >59)

## 2021-05-12 LAB — LIPID PANEL
Chol/HDL Ratio: 4.8 ratio — ABNORMAL HIGH (ref 0.0–4.4)
Cholesterol, Total: 190 mg/dL (ref 100–199)
HDL: 40 mg/dL (ref >39)
LDL Chol Calc (NIH): 103 mg/dL — ABNORMAL HIGH (ref 0–99)
Triglycerides: 274 mg/dL — ABNORMAL HIGH (ref 0–149)
VLDL Cholesterol Cal: 47 mg/dL — ABNORMAL HIGH (ref 5–40)

## 2021-05-15 ENCOUNTER — Encounter: Payer: Self-pay | Admitting: Nurse Practitioner

## 2021-05-29 ENCOUNTER — Telehealth: Payer: Medicare Other

## 2021-06-10 ENCOUNTER — Telehealth: Payer: Self-pay

## 2021-06-10 NOTE — Chronic Care Management (AMB) (Signed)
  Care Management   Note  06/10/2021 Name: Kylie Ward MRN: [id number] DOB: June 25, 1946  Kylie Ward is a 75 y.o. year old female who is a primary care patient of Bennie Pierini, FNP and is actively engaged with the care management team. I reached out to Gardiner Sleeper by phone today to assist with re-scheduling an initial visit with the Pharmacist  Follow up plan: Patient declines further follow up and engagement by the care management team. Appropriate care team members and provider have been notified via electronic communication.   Penne Lash, RMA Care Guide, Embedded Care Coordination [facility]  [place], [place] [address] Direct Dial: [phone] [email or web address]

## 2021-06-16 ENCOUNTER — Encounter: Payer: Self-pay | Admitting: Cardiology

## 2021-06-24 ENCOUNTER — Ambulatory Visit (INDEPENDENT_AMBULATORY_CARE_PROVIDER_SITE_OTHER): Payer: Medicare Other | Admitting: Nurse Practitioner

## 2021-06-24 ENCOUNTER — Encounter: Payer: Self-pay | Admitting: Nurse Practitioner

## 2021-06-24 DIAGNOSIS — R112 Nausea with vomiting, unspecified: Secondary | ICD-10-CM | POA: Diagnosis not present

## 2021-06-24 DIAGNOSIS — R1110 Vomiting, unspecified: Secondary | ICD-10-CM | POA: Insufficient documentation

## 2021-06-24 MED ORDER — ONDANSETRON HCL 4 MG PO TABS: 4.0000 mg | ORAL_TABLET | Freq: Three times a day (TID) | ORAL | 0 refills | Status: DC | PRN

## 2021-06-24 NOTE — Assessment & Plan Note (Signed)
Antiemetic given to education provided., Zofran for nausea, increase hydration and electrolytes.  Brat diet [bread, rice, applesauce and toast], follow-up with worsening symptoms.  Patient/caregiver verbalized understanding.  Rx sent to pharmacy.

## 2021-06-24 NOTE — Progress Notes (Signed)
   Virtual Visit  Note Due to COVID-19 pandemic this visit was conducted virtually. This visit type was conducted due to national recommendations for restrictions regarding the COVID-19 Pandemic (e.g. social distancing, sheltering in place) in an effort to limit this patient's exposure and mitigate transmission in our community. All issues noted in this document were discussed and addressed.  A physical exam was not performed with this format.  I connected with Kylie Ward on 06/24/21 at 11:30 AM by telephone and verified that I am speaking with the correct person using two identifiers. Kylie Ward is currently located at home during visit. The provider, Daryll Drown, NP is located in their office at time of visit.  I discussed the limitations, risks, security and privacy concerns of performing an evaluation and management service by telephone and the availability of in person appointments. I also discussed with the patient that there may be a patient responsible charge related to this service. The patient expressed understanding and agreed to proceed.   History and Present Illness:  Emesis  This is a new problem. The current episode started yesterday. The problem occurs 2 to 4 times per day. The problem has been unchanged. The emesis has an appearance of stomach contents. There has been no fever. Pertinent negatives include no abdominal pain, coughing, diarrhea, dizziness or headaches. She has tried nothing for the symptoms.     Review of Systems  Constitutional: Negative.   HENT: Negative.    Respiratory:  Negative for cough.   Gastrointestinal:  Positive for vomiting. Negative for abdominal pain and diarrhea.  Skin: Negative.  Negative for rash.  Neurological:  Negative for dizziness and headaches.  All other systems reviewed and are negative.   Observations/Objective: Televisit patient not in distress.  Assessment and Plan: Antiemetic given to education provided.,  Zofran for nausea, increase hydration and electrolytes.  Brat diet [bread, rice, applesauce and toast], follow-up with worsening symptoms.  Patient/caregiver verbalized understanding.  Rx sent to pharmacy.  Follow Up Instructions: Follow-up with unresolved symptoms.    I discussed the assessment and treatment plan with the patient. The patient was provided an opportunity to ask questions and all were answered. The patient agreed with the plan and demonstrated an understanding of the instructions.   The patient was advised to call back or seek an in-person evaluation if the symptoms worsen or if the condition fails to improve as anticipated.  The above assessment and management plan was discussed with the patient. The patient verbalized understanding of and has agreed to the management plan. Patient is aware to call the clinic if symptoms persist or worsen. Patient is aware when to return to the clinic for a follow-up visit. Patient educated on when it is appropriate to go to the emergency department.   Time call ended: 11:38 AM  I provided 8 minutes of  non face-to-face time during this encounter.    Daryll Drown, NP

## 2021-07-10 ENCOUNTER — Other Ambulatory Visit: Payer: Self-pay | Admitting: Nurse Practitioner

## 2021-07-24 ENCOUNTER — Ambulatory Visit: Payer: Medicare Other | Admitting: Cardiology

## 2021-08-21 ENCOUNTER — Ambulatory Visit: Payer: Self-pay | Admitting: Nurse Practitioner

## 2021-08-27 ENCOUNTER — Ambulatory Visit: Payer: Medicare Other | Admitting: Nurse Practitioner

## 2021-09-02 ENCOUNTER — Ambulatory Visit: Payer: Medicare Other | Admitting: Nurse Practitioner

## 2021-09-07 ENCOUNTER — Other Ambulatory Visit: Payer: Self-pay

## 2021-09-07 ENCOUNTER — Encounter: Payer: Self-pay | Admitting: Nurse Practitioner

## 2021-09-07 ENCOUNTER — Ambulatory Visit (INDEPENDENT_AMBULATORY_CARE_PROVIDER_SITE_OTHER): Payer: Medicare Other | Admitting: Nurse Practitioner

## 2021-09-07 VITALS — BP 126/75 | HR 94 | Temp 97.7°F | Resp 20 | Ht 61.0 in | Wt 164.0 lb

## 2021-09-07 DIAGNOSIS — Z794 Long term (current) use of insulin: Secondary | ICD-10-CM

## 2021-09-07 DIAGNOSIS — Z23 Encounter for immunization: Secondary | ICD-10-CM | POA: Diagnosis not present

## 2021-09-07 DIAGNOSIS — R609 Edema, unspecified: Secondary | ICD-10-CM

## 2021-09-07 DIAGNOSIS — I10 Essential (primary) hypertension: Secondary | ICD-10-CM

## 2021-09-07 DIAGNOSIS — I6930 Unspecified sequelae of cerebral infarction: Secondary | ICD-10-CM | POA: Diagnosis not present

## 2021-09-07 DIAGNOSIS — I6523 Occlusion and stenosis of bilateral carotid arteries: Secondary | ICD-10-CM | POA: Diagnosis not present

## 2021-09-07 DIAGNOSIS — I700 Atherosclerosis of aorta: Secondary | ICD-10-CM | POA: Diagnosis not present

## 2021-09-07 DIAGNOSIS — R600 Localized edema: Secondary | ICD-10-CM

## 2021-09-07 DIAGNOSIS — E1142 Type 2 diabetes mellitus with diabetic polyneuropathy: Secondary | ICD-10-CM

## 2021-09-07 DIAGNOSIS — E785 Hyperlipidemia, unspecified: Secondary | ICD-10-CM

## 2021-09-07 DIAGNOSIS — F411 Generalized anxiety disorder: Secondary | ICD-10-CM

## 2021-09-07 DIAGNOSIS — Z6831 Body mass index (BMI) 31.0-31.9, adult: Secondary | ICD-10-CM

## 2021-09-07 LAB — BAYER DCA HB A1C WAIVED: HB A1C (BAYER DCA - WAIVED): 8.7 % — ABNORMAL HIGH (ref 4.8–5.6)

## 2021-09-07 NOTE — Patient Instructions (Signed)
Carotid Artery Disease  Carotid artery disease, also called carotid artery stenosis, is the narrowing or blockage of one or both carotid arteries. The carotid arteries are the two main blood vessels on either side of the neck. They supply blood to the brain,other parts of the head, and the neck. Carotid artery disease increases your risk for a stroke or a transient ischemic attack (TIA). A TIA is a "mini-stroke" that causes stroke-like symptoms thatthen go away quickly. What are the causes? This condition is mainly caused by a narrowing and hardening of the carotid arteries (atherosclerosis). The carotid arteries can become narrow or clogged with a buildup of fat, cholesterol, calcium, and other substances (plaque). What increases the risk? The following factors may make you more likely to develop this condition: Having certain medical conditions, such as: High cholesterol. High blood pressure (hypertension). Diabetes. Obesity. Smoking. A family history of cardiovascular disease. Inactivity or lack of regular exercise. Being female. Men have an increased risk of developing atherosclerosis earlier in life than women. Old age. What are the signs or symptoms? This condition may not have any signs or symptoms until a stroke or TIA occurs. In some cases, your health care provider may be able to hear a whooshing sound (bruit). This can indicate a change in blood flow caused by plaque buildup. An eyeexam can also help identify signs of the condition. How is this diagnosed? This condition may be diagnosed with a physical exam, your medical history, and your family's medical history. You may also have tests that look at the blood flow in your carotid arteries, such as: Carotid artery ultrasound, which uses sound waves to create pictures to show if the arteries are narrow or blocked. Tests that use a dye injected into a vein to highlight your arteries on images, such as: Carotid or cerebral angiography,  which uses X-rays. Computerized tomographic angiography (CTA), which uses CT scans. Magnetic resonance angiography (MRA), which uses MRI. How is this treated? This condition may be treated with a combination of treatments. Treatment options include: Lifestyle changes, such as: Quitting smoking. Exercising regularly or as told by your health care provider. Eating a heart-healthy diet. Managing stress. Maintaining a healthy weight. Medicines to control blood pressure, cholesterol, and blood clotting. Surgery. You may have: A carotid endarterectomy. This is a surgery to remove the blockages in the carotid arteries. A carotid angioplasty with stenting. This is a procedure in which a small mesh tube (stent) is used to widen the blocked carotid arteries. Follow these instructions at home: Eating and drinking Follow instructions about your diet from your health care provider. It is important to: Eat a healthy diet that is low in saturated fats and includes plenty of fresh fruits, vegetables, and lean meats. Avoid foods that are high in fat and salt (sodium). Avoid foods that are fried, overly processed, or have poor nutritional value.  Lifestyle  Maintain a healthy weight. Do exercises as told by your health care provider to stay physically active. It is recommended that each week you get at least 150 minutes of moderate-intensity exercise or 75 minutes of exercise that takes a lot of effort. Do not use any products that contain nicotine or tobacco, such as cigarettes, e-cigarettes, and chewing tobacco. If you need help quitting, ask your health care provider. Do not drink alcohol if: Your health care provider tells you not to drink. You are pregnant, may be pregnant, or are planning to become pregnant. If you drink alcohol: Limit how much you use   to: 0-1 drink a day for women. 0-2 drinks a day for men. Be aware of how much alcohol is in your drink. In the [place] one drink equals one 12 oz  bottle of beer (355 mL), one 5 oz glass of wine (148 mL), or one 1 oz glass of hard liquor (44 mL). Do not use drugs. Manage your stress. Ask your health care provider for stress management tips.  General instructions Take over-the-counter and prescription medicines only as told by your health care provider. Keep all follow-up visits as told by your health care provider. This is important. Where to find more information American Heart Association: www.heart.org Get help right away if: You have any symptoms of a stroke. "BE FAST" is an easy way to remember the main warning signs of a stroke: B - Balance. Signs are dizziness, sudden trouble walking, or loss of balance. E - Eyes. Signs are trouble seeing or a sudden change in vision. F - Face. Signs are sudden weakness or numbness of the face, or the face or eyelid drooping on one side. A - Arms. Signs are weakness or numbness in an arm. This happens suddenly and usually on one side of the body. S - Speech. Signs are sudden trouble speaking, slurred speech, or trouble understanding what people say. T - Time. Time to call emergency services. Write down what time symptoms started. You have other signs of a stroke, such as: A sudden, severe headache with no known cause. Nausea or vomiting. Seizure. These symptoms may represent a serious problem that is an emergency. Do not wait to see if the symptoms will go away. Get medical help right away. Call your local emergency services (911 in the [place] Do not drive yourself to the hospital. Summary Carotid artery disease, also called carotid artery stenosis, is the narrowing or blockage of one or both carotid arteries. Carotid artery disease increases your risk for a stroke or a transient ischemic attack (TIA). This condition can be treated with lifestyle changes, medicines, surgery, or a combination of these treatments. Get help right away if you have any symptoms of stroke. The acronym BEFAST is an  easy way to remember the main warning signs of stroke. This information is not intended to replace advice given to you by your health care provider. Make sure you discuss any questions you have with your healthcare provider. Document Revised: 04/30/2019 Document Reviewed: 04/30/2019 Elsevier Patient Education  2022 Elsevier Inc.  

## 2021-09-07 NOTE — Progress Notes (Signed)
 Subjective:    Patient ID: Kylie Ward, female    DOB: 07/24/1946, 75 y.o.   MRN: [id number]   Chief Complaint: medical management of chronic issues     HPI:  1. Type 2 diabetes mellitus with diabetic polyneuropathy, with long-term current use of insulin (HCC) Fasting blood sugars have not been good. She denies any low blood sugars. She cannot remember the exact numbers. She cannot afford trulicity.  Lab Results  Component Value Date   HGBA1C 9.2 (H) 05/11/2021     2. Hyperlipidemia with target LDL less than 100 Does not watch diet very closely and does no dedicated exercise. She says statins cause muscle aches so she has not been taking Lab Results  Component Value Date   CHOL 190 05/11/2021   HDL 40 05/11/2021   LDLCALC 103 (H) 05/11/2021   TRIG 274 (H) 05/11/2021   CHOLHDL 4.8 (H) 05/11/2021    3. Essential hypertension, benign No c/o chest pain, sob or headache. Does not check blood pressure at home. BP Readings from Last 3 Encounters:  09/07/21 126/75  05/11/21 (!) 84/61  01/16/21 101/71     4. Symptomatic stenosis of both carotid arteries Hasnot had doppler study since she had her stroke. Right side clear and left side stent in place ( 2016 ).  5. Atherosclerosis of aorta (HCC) Seen on chest xray. She again denies chest pain or SOB.  6. Late effect of cerebrovascular accident (CVA) No residual effects anymore  7. Peripheral edema Has some swelling at the end of each day.  8. GAD (generalized anxiety disorder) Stays stressed all the time. Is on lexapro and xanax Ward GAD 7 : Generalized Anxiety Score 09/07/2021 05/11/2021 01/11/2020  Nervous, Anxious, on Edge 1 1 3  Control/stop worrying 1 2 2  Worry too much - different things 1 1 3  Trouble relaxing 1 1 1  Restless 0 1 0  Easily annoyed or irritable 0 2 2  Afraid - awful might happen 0 1 2  Total GAD 7 Score 4 9 13  Anxiety Difficulty Somewhat difficult Not difficult at all Somewhat difficult     Depression screen PHQ 2/9 09/07/2021 05/11/2021 05/07/2021  Decreased Interest 2 1 1  Down, Depressed, Hopeless 3 2 1  PHQ - 2 Score 5 3 2  Altered sleeping 2 1 0  Tired, decreased energy 1 2 0  Change in appetite 2 0 0  Feeling bad or failure about yourself  3 3 2  Trouble concentrating 0 0 0  Moving slowly or fidgety/restless 0 1 0  Suicidal thoughts 0 0 0  PHQ-9 Score 13 10 4  Difficult doing work/chores Somewhat difficult Not difficult at all Somewhat difficult  Some recent data might be hidden     9. BMI 31.0-31.9,adult Weight is down 3 lbs Wt Readings from Last 3 Encounters:  09/07/21 164 lb (74.4 kg)  05/11/21 167 lb (75.8 kg)  05/07/21 166 lb (75.3 kg)   BMI Readings from Last 3 Encounters:  09/07/21 30.99 kg/m  05/11/21 31.55 kg/m  05/07/21 31.37 kg/m      Outpatient Encounter Medications as of 09/07/2021  Medication Sig   Accu-Chek FastClix Lancets MISC CHECK BLOOD SUGAR UP TO 3 TIMES A DAY AS DIRECTED Dx E11.42   alendronate (FOSAMAX) 70 MG tablet Take 1 tablet (70 mg total) by mouth once a week. Take with a full glass of water on an empty stomach.   ALPRAZolam (XANAX) 1 MG tablet Take 1   tablet (1 mg total) by mouth 2 (two) times Ward.   aspirin 81 MG tablet Take 81 mg by mouth Ward.   Blood Glucose Monitoring Suppl (ACCU-CHEK NANO SMARTVIEW) w/Device KIT CHECK BLOOD SUGER UP TO 3 TIMES A DAY   Dulaglutide (TRULICITY) 1.5 MG/0.5ML SOPN Inject 1.5 mg into the skin once a week.   Elastic Bandages & Supports (V-2 HIGH COMPRESSION HOSE) MISC 1 each by Does not apply route Ward.   escitalopram (LEXAPRO) 5 MG tablet Take 1 tablet (5 mg total) by mouth Ward.   fenofibrate 160 MG tablet Take 1 tablet (160 mg total) by mouth Ward.   furosemide (LASIX) 40 MG tablet Take 1 tablet (40 mg total) by mouth Ward.   glucose blood (ACCU-CHEK SMARTVIEW) test strip CHECK BLOOD SUGAR UP TO 3 TIMES A DAY AS DIRECTED   insulin glargine (LANTUS) 100 UNIT/ML injection Inject  0.65 mLs (65 Units total) into the skin at bedtime.   insulin lispro (HUMALOG) 100 UNIT/ML injection Sliding scale up to 12u TID   Insulin Pen Needle (SURE COMFORT PEN NEEDLES) 31G X 8 MM MISC USE TO inject lantus ONCE Ward   lisinopril (ZESTRIL) 20 MG tablet Take 1 tablet (20 mg total) by mouth Ward.   metFORMIN (GLUCOPHAGE-XR) 750 MG 24 hr tablet Take 1 tablet (750 mg total) by mouth 2 (two) times Ward with a meal.   mirabegron ER (MYRBETRIQ) 25 MG TB24 tablet Take 1 tablet (25 mg total) by mouth Ward.   ondansetron (ZOFRAN) 4 MG tablet Take 1 tablet (4 mg total) by mouth every 8 (eight) hours as needed for nausea or vomiting.   simvastatin (ZOCOR) 80 MG tablet Take 1 tablet (80 mg total) by mouth Ward.   No facility-administered encounter medications on file as of 09/07/2021.    Past Surgical History:  Procedure Laterality Date   CAROTID ENDARTERECTOMY Right    CAROTID STENT      Family History  Problem Relation Age of Onset   Heart disease Father    Heart attack Father    Hypertension Father    Diabetes Sister    Stroke Sister    Diabetes Brother    Diabetes Sister    Diabetes Sister    Diabetes Brother    Diabetes Brother    Diabetes Brother     New complaints: None today  Social history: Lives with her husband  Controlled substance contract: 05/14/21     Review of Systems  Constitutional:  Negative for diaphoresis.  Eyes:  Negative for pain.  Respiratory:  Negative for shortness of breath.   Cardiovascular:  Negative for chest pain, palpitations and leg swelling.  Gastrointestinal:  Negative for abdominal pain.  Endocrine: Negative for polydipsia.  Skin:  Negative for rash.  Neurological:  Negative for dizziness, weakness and headaches.  Hematological:  Does not bruise/bleed easily.  All other systems reviewed and are negative.     Objective:   Physical Exam Vitals and nursing note reviewed.  Constitutional:      General: She is not in acute  distress.    Appearance: Normal appearance. She is well-developed.  HENT:     Head: Normocephalic.     Right Ear: Tympanic membrane normal.     Left Ear: Tympanic membrane normal.     Nose: Nose normal.     Mouth/Throat:     Mouth: Mucous membranes are moist.  Eyes:     Pupils: Pupils are equal, round, and reactive to light.  Neck:       Vascular: No carotid bruit or JVD.  Cardiovascular:     Rate and Rhythm: Normal rate and regular rhythm.     Heart sounds: Normal heart sounds.  Pulmonary:     Effort: Pulmonary effort is normal. No respiratory distress.     Breath sounds: Normal breath sounds. No wheezing or rales.  Chest:     Chest wall: No tenderness.  Abdominal:     General: Bowel sounds are normal. There is no distension or abdominal bruit.     Palpations: Abdomen is soft. There is no hepatomegaly, splenomegaly, mass or pulsatile mass.     Tenderness: There is no abdominal tenderness.  Musculoskeletal:        General: Normal range of motion.     Cervical back: Normal range of motion and neck supple.  Lymphadenopathy:     Cervical: No cervical adenopathy.  Skin:    General: Skin is warm and dry.  Neurological:     Mental Status: She is alert and oriented to person, place, and time.     Deep Tendon Reflexes: Reflexes are normal and symmetric.  Psychiatric:        Behavior: Behavior normal.        Thought Content: Thought content normal.        Judgment: Judgment normal.     BP 126/75   Pulse 94   Temp 97.7 F (36.5 C) (Temporal)   Resp 20   Ht 5' 1" (1.549 m)   Wt 164 lb (74.4 kg)   LMP 01/27/1983   SpO2 99%   BMI 30.99 kg/m   HGBA1c 8.7%     Assessment & Plan:  Mariene H Hankin comes in today with chief complaint of Medical Management of Chronic Issues   Diagnosis and orders addressed:  1. Type 2 diabetes mellitus with diabetic polyneuropathy, with long-term current use of insulin (HCC) Will make appointment with clical pharmacist to discussed  meds - Bayer DCA Hb A1c Waived - Microalbumin / creatinine urine ratio  2. Hyperlipidemia with target LDL less than 100 Low fat diet - Lipid panel  3. Essential hypertension, benign Low sodium diet - CBC with Differential/Platelet - CMP14+EGFR  4. Symptomatic stenosis of both carotid arteries Probably needs to have rechecked. Going to wiat for now.  5. Atherosclerosis of aorta (HCC)  6. Late effect of cerebrovascular accident (CVA)  7. Peripheral edema Elevate legs when sitting  8. GAD (generalized anxiety disorder) Stress management  9. BMI 31.0-31.9,adult Discussed diet and exercise for person with BMI >25 Will recheck weight in 3-6 months    Labs pending Health Maintenance reviewed Diet and exercise encouraged  Follow up plan: 3 months   Mary-Margaret , FNP   

## 2021-09-08 ENCOUNTER — Telehealth: Payer: Self-pay

## 2021-09-08 LAB — CMP14+EGFR
ALT: 27 IU/L (ref 0–32)
AST: 29 IU/L (ref 0–40)
Albumin/Globulin Ratio: 1.7 (ref 1.2–2.2)
Albumin: 4.2 g/dL (ref 3.7–4.7)
Alkaline Phosphatase: 84 IU/L (ref 44–121)
BUN/Creatinine Ratio: 25 (ref 12–28)
BUN: 28 mg/dL — ABNORMAL HIGH (ref 8–27)
Bilirubin Total: 0.2 mg/dL (ref 0.0–1.2)
CO2: 21 mmol/L (ref 20–29)
Calcium: 9.4 mg/dL (ref 8.7–10.3)
Chloride: 104 mmol/L (ref 96–106)
Creatinine, Ser: 1.13 mg/dL — ABNORMAL HIGH (ref 0.57–1.00)
Globulin, Total: 2.5 g/dL (ref 1.5–4.5)
Glucose: 123 mg/dL — ABNORMAL HIGH (ref 70–99)
Potassium: 4.4 mmol/L (ref 3.5–5.2)
Sodium: 141 mmol/L (ref 134–144)
Total Protein: 6.7 g/dL (ref 6.0–8.5)
eGFR: 51 mL/min/{1.73_m2} — ABNORMAL LOW (ref >59)

## 2021-09-08 LAB — CBC WITH DIFFERENTIAL/PLATELET
Basophils Absolute: 0.1 10*3/uL (ref 0.0–0.2)
Basos: 0 %
EOS (ABSOLUTE): 0.1 10*3/uL (ref 0.0–0.4)
Eos: 1 %
Hematocrit: 39.9 % (ref 34.0–46.6)
Hemoglobin: 13.3 g/dL (ref 11.1–15.9)
Immature Grans (Abs): 0 10*3/uL (ref 0.0–0.1)
Immature Granulocytes: 0 %
Lymphocytes Absolute: 2.4 10*3/uL (ref 0.7–3.1)
Lymphs: 21 %
MCH: 30.6 pg (ref 26.6–33.0)
MCHC: 33.3 g/dL (ref 31.5–35.7)
MCV: 92 fL (ref 79–97)
Monocytes Absolute: 0.7 10*3/uL (ref 0.1–0.9)
Monocytes: 6 %
Neutrophils Absolute: 8 10*3/uL — ABNORMAL HIGH (ref 1.4–7.0)
Neutrophils: 72 %
Platelets: 263 10*3/uL (ref 150–450)
RBC: 4.34 x10E6/uL (ref 3.77–5.28)
RDW: 12.6 % (ref 11.7–15.4)
WBC: 11.2 10*3/uL — ABNORMAL HIGH (ref 3.4–10.8)

## 2021-09-08 LAB — LIPID PANEL
Chol/HDL Ratio: 5 ratio — ABNORMAL HIGH (ref 0.0–4.4)
Cholesterol, Total: 181 mg/dL (ref 100–199)
HDL: 36 mg/dL — ABNORMAL LOW (ref >39)
LDL Chol Calc (NIH): 91 mg/dL (ref 0–99)
Triglycerides: 325 mg/dL — ABNORMAL HIGH (ref 0–149)
VLDL Cholesterol Cal: 54 mg/dL — ABNORMAL HIGH (ref 5–40)

## 2021-09-08 NOTE — Chronic Care Management (AMB) (Signed)
  Chronic Care Management   Note  09/08/2021 Name: Kylie Ward MRN: [id number] DOB: 10-21-1946  Kylie Ward is a 75 y.o. year old female who is a primary care patient of Chevis Pretty, [place]. I reached out to Bea Graff by phone today in response to a referral sent by Kylie Ward PCP.  Kylie Ward was given information about Chronic Care Management services today including:  CCM service includes personalized support from designated clinical staff supervised by her physician, including individualized plan of care and coordination with other care providers 24/7 contact phone numbers for assistance for urgent and routine care needs. Service will only be billed when office clinical staff spend 20 minutes or more in a month to coordinate care. Only one practitioner may furnish and bill the service in a calendar month. The patient may stop CCM services at any time (effective at the end of the month) by phone call to the office staff. The patient is responsible for co-pay (up to 20% after annual deductible is met) if co-pay is required by the individual health plan.   Patient agreed to services and verbal consent obtained.   Follow up plan: Telephone appointment with care management team member scheduled for:09/11/2021  Noreene Larsson, [place], [place], [place] [address] Direct Dial: [phone] [email or web address]: [email or web address]

## 2021-09-09 ENCOUNTER — Telehealth: Payer: Self-pay

## 2021-09-09 NOTE — Chronic Care Management (AMB) (Signed)
  Care Management   Note  09/09/2021 Name: [name] MRN: [id number] DOB: February 13, 1946  Kylie Ward is a 75 y.o. year old female who is a primary care patient of Bennie Pierini, FNP and is actively engaged with the care management team. I reached out to Gardiner Sleeper by phone today to assist with re-scheduling an initial visit with the RN Case Manager  Follow up plan: Telephone appointment with care management team member scheduled for:09/10/2021  Penne Lash, RMA Care Guide, Embedded Care Coordination [facility]  [place], [place] [address] Direct Dial: [phone] [email or web address]: [email or web address]

## 2021-09-09 NOTE — Chronic Care Management (AMB) (Signed)
  Care Management   Note  09/09/2021 Name: Kylie Ward MRN: [id number] DOB: 22-May-1946  Kylie Ward is a 75 y.o. year old female who is a primary care patient of Bennie Pierini, FNP and is actively engaged with the care management team. I reached out to Gardiner Sleeper by phone today to assist with re-scheduling an initial visit with the RN Case Manager  Follow up plan: Unsuccessful telephone outreach attempt made. A HIPAA compliant phone message was left for the patient providing contact information and requesting a return call.  The care management team will reach out to the patient again over the next 3 days.  If patient returns call to provider office, please advise to call Embedded Care Management Care Guide Penne Lash  at [phone]  Penne Lash, RMA Care Guide, Embedded Care Coordination [facility]  [place], [place] [address] Direct Dial: [phone] [email or web address] Website: [email or web address]

## 2021-09-10 ENCOUNTER — Telehealth: Payer: Self-pay | Admitting: *Deleted

## 2021-09-10 ENCOUNTER — Telehealth: Payer: Medicare Other | Admitting: *Deleted

## 2021-09-11 ENCOUNTER — Telehealth: Payer: Medicare Other

## 2021-09-16 ENCOUNTER — Ambulatory Visit: Payer: Medicare Other | Admitting: Cardiology

## 2021-09-16 NOTE — Telephone Encounter (Signed)
  Care Management   Follow Up Note   09/10/2021 Name: Kylie Ward MRN: [id number] DOB: Dec 22, 1945   Referred by: Bennie Pierini, FNP Reason for referral : Chronic Care Management (Unsuccessful initial telephone visit)   An unsuccessful telephone outreach was attempted today. The patient was referred to the case management team for assistance with care management and care coordination.   Follow Up Plan: A HIPPA compliant phone message was left for the patient providing contact information and requesting a return call. Forwarding to [facility] Care Guide for outreach and rescheduling.    Demetrios Loll, BSN, RN-BC Embedded Chronic Care Manager [facility] / [facility] Care Management Direct Dial: [phone]

## 2021-09-29 ENCOUNTER — Other Ambulatory Visit: Payer: Self-pay | Admitting: Nurse Practitioner

## 2021-09-29 DIAGNOSIS — F411 Generalized anxiety disorder: Secondary | ICD-10-CM

## 2021-09-30 ENCOUNTER — Ambulatory Visit (HOSPITAL_COMMUNITY): Payer: Medicare Other

## 2021-10-05 ENCOUNTER — Telehealth: Payer: Self-pay | Admitting: *Deleted

## 2021-10-05 ENCOUNTER — Telehealth: Payer: Medicare Other | Admitting: *Deleted

## 2021-10-05 NOTE — Telephone Encounter (Signed)
  Care Management   Follow Up Note   10/05/2021 Name: Kylie Ward MRN: [id number] DOB: [birth year]   Referred by: Bennie Pierini, FNP Reason for referral : Chronic Care Management (Unsuccessful initial telephone visit)   A second unsuccessful telephone outreach was attempted today. The patient was referred to the case management team for assistance with care management and care coordination.   Follow Up Plan: Spoke with patient's husband. Telephone follow up appointment with care management team member scheduled for: 10/06/21 with RNCM.  Demetrios Loll, BSN, RN-BC Embedded Chronic Care Manager [facility] / [facility] Care Management Direct Dial: [phone]

## 2021-10-06 ENCOUNTER — Telehealth: Payer: Medicare Other | Admitting: *Deleted

## 2021-10-06 ENCOUNTER — Telehealth: Payer: Self-pay | Admitting: *Deleted

## 2021-10-06 NOTE — Telephone Encounter (Signed)
  Care Management   Follow Up Note   10/06/2021 Name: Kylie Ward MRN: [id number] DOB: [birth year]   Referred by: Bennie Pierini, FNP Reason for referral : Chronic Care Management (Unsuccessful telephone outreach)  A 3rd unsuccessful telephone outreach was attempted today. The patient was referred to the case management team for assistance with care management and care coordination. I spoke with the patient's husband yesterday because the patient was unavailable. I rescheduled for today and the 2 outreach calls were picked up but no one spoke.   Patient would likely qualify for patient assistance for Trulicity through [facility]. I can assist with this if she will talk with me.   Follow Up Plan:  Forwarding to [facility] Care Guide for outreach and rescheduling.   Demetrios Loll, BSN, RN-BC Embedded Chronic Care Manager [facility] / [facility] Care Management Direct Dial: [phone]

## 2021-10-14 ENCOUNTER — Telehealth: Payer: Self-pay

## 2021-10-14 NOTE — Chronic Care Management (AMB) (Signed)
°  Care Management   Note  10/14/2021 Name: LAURALI GODDARD MRN: [id number] DOB: 1946-10-08  MALERIE EAKINS is a 75 y.o. year old female who is a primary care patient of Bennie Pierini, FNP and is actively engaged with the care management team. I reached out to Gardiner Sleeper by phone today to assist with re-scheduling an initial visit with the RN Case Manager  Follow up plan: Unsuccessful telephone outreach attempt made. A HIPAA compliant phone message was left for the patient providing contact information and requesting a return call.  The care management team will reach out to the patient again over the next 7 days.  If patient returns call to provider office, please advise to call Embedded Care Management Care Guide Penne Lash  at [phone]  Penne Lash, RMA Care Guide, Embedded Care Coordination [facility]  [place], [place] [address] Direct Dial: [phone] [email or web address]: [email or web address]

## 2021-10-19 NOTE — Chronic Care Management (AMB) (Signed)
°  Care Management   Note  10/19/2021 Name: Kylie Ward MRN: [id number] DOB: 1946/05/20  Kylie Ward is a 75 y.o. year old female who is a primary care patient of Bennie Pierini, FNP and is actively engaged with the care management team. I reached out to Gardiner Sleeper by phone today to assist with re-scheduling an initial visit with the RN Case Manager  Follow up plan: Unsuccessful telephone outreach attempt made. A HIPAA compliant phone message was left for the patient providing contact information and requesting a return call.  The care management team will reach out to the patient again over the next 7 days.  If patient returns call to provider office, please advise to call Embedded Care Management Care Guide Penne Lash  at [phone]  Penne Lash, RMA Care Guide, Embedded Care Coordination [facility]  [place], [place] [address] Direct Dial: [phone] [email or web address] Website: [email or web address]

## 2021-10-28 NOTE — Chronic Care Management (AMB) (Signed)
°  Care Management   Note  10/28/2021 Name: Kylie Ward MRN: [id number] DOB: 1946-01-25  Kylie Ward is a 75 y.o. year old female who is a primary care patient of Bennie Pierini, FNP and is actively engaged with the care management team. I reached out to Gardiner Sleeper by phone today to assist with re-scheduling an initial visit with the RN Case Manager  Follow up plan: Patient declines further follow up and engagement by the care management team. Appropriate care team members and provider have been notified via electronic communication.   Penne Lash, RMA Care Guide, Embedded Care Coordination [facility]  [place], [place] [address] Direct Dial: [phone] [email or web address] Website: [email or web address]

## 2021-10-28 NOTE — Telephone Encounter (Signed)
Pt declined rescheduling  

## 2021-12-08 ENCOUNTER — Ambulatory Visit (INDEPENDENT_AMBULATORY_CARE_PROVIDER_SITE_OTHER): Payer: Medicare Other | Admitting: Nurse Practitioner

## 2021-12-08 ENCOUNTER — Encounter: Payer: Self-pay | Admitting: Nurse Practitioner

## 2021-12-08 VITALS — BP 114/58 | HR 88 | Temp 98.4°F | Ht 61.0 in | Wt 167.6 lb

## 2021-12-08 DIAGNOSIS — E1142 Type 2 diabetes mellitus with diabetic polyneuropathy: Secondary | ICD-10-CM | POA: Diagnosis not present

## 2021-12-08 DIAGNOSIS — E785 Hyperlipidemia, unspecified: Secondary | ICD-10-CM

## 2021-12-08 DIAGNOSIS — F411 Generalized anxiety disorder: Secondary | ICD-10-CM

## 2021-12-08 DIAGNOSIS — I10 Essential (primary) hypertension: Secondary | ICD-10-CM

## 2021-12-08 DIAGNOSIS — I6523 Occlusion and stenosis of bilateral carotid arteries: Secondary | ICD-10-CM

## 2021-12-08 DIAGNOSIS — R3915 Urgency of urination: Secondary | ICD-10-CM | POA: Diagnosis not present

## 2021-12-08 DIAGNOSIS — Z6831 Body mass index (BMI) 31.0-31.9, adult: Secondary | ICD-10-CM

## 2021-12-08 DIAGNOSIS — R609 Edema, unspecified: Secondary | ICD-10-CM | POA: Diagnosis not present

## 2021-12-08 DIAGNOSIS — I700 Atherosclerosis of aorta: Secondary | ICD-10-CM | POA: Diagnosis not present

## 2021-12-08 DIAGNOSIS — I6930 Unspecified sequelae of cerebral infarction: Secondary | ICD-10-CM | POA: Diagnosis not present

## 2021-12-08 DIAGNOSIS — Z794 Long term (current) use of insulin: Secondary | ICD-10-CM | POA: Diagnosis not present

## 2021-12-08 LAB — BAYER DCA HB A1C WAIVED: HB A1C (BAYER DCA - WAIVED): 8.6 % — ABNORMAL HIGH (ref 4.8–5.6)

## 2021-12-08 MED ORDER — LISINOPRIL 20 MG PO TABS: 20.0000 mg | ORAL_TABLET | Freq: Every day | ORAL | 5 refills | Status: DC

## 2021-12-08 MED ORDER — INSULIN GLARGINE 100 UNIT/ML ~~LOC~~ SOLN: 65.0000 [IU] | Freq: Every day | SUBCUTANEOUS | 5 refills | Status: DC

## 2021-12-08 MED ORDER — SIMVASTATIN 80 MG PO TABS: 80.0000 mg | ORAL_TABLET | Freq: Every day | ORAL | 1 refills | Status: DC

## 2021-12-08 MED ORDER — ALPRAZOLAM 1 MG PO TABS: 1.0000 mg | ORAL_TABLET | Freq: Two times a day (BID) | ORAL | 2 refills | Status: DC

## 2021-12-08 MED ORDER — FUROSEMIDE 40 MG PO TABS: 40.0000 mg | ORAL_TABLET | Freq: Every day | ORAL | 1 refills | Status: DC

## 2021-12-08 MED ORDER — FENOFIBRATE 160 MG PO TABS: 160.0000 mg | ORAL_TABLET | Freq: Every day | ORAL | 1 refills | Status: DC

## 2021-12-08 MED ORDER — ESCITALOPRAM OXALATE 5 MG PO TABS: 5.0000 mg | ORAL_TABLET | Freq: Every day | ORAL | 3 refills | Status: DC

## 2021-12-08 MED ORDER — MIRABEGRON ER 25 MG PO TB24: 25.0000 mg | ORAL_TABLET | Freq: Every day | ORAL | 5 refills | Status: DC

## 2021-12-08 MED ORDER — METFORMIN HCL ER 750 MG PO TB24: 750.0000 mg | ORAL_TABLET | Freq: Two times a day (BID) | ORAL | 1 refills | Status: DC

## 2021-12-08 MED ORDER — TRULICITY 1.5 MG/0.5ML ~~LOC~~ SOAJ: 1.5000 mg | SUBCUTANEOUS | 3 refills | Status: DC

## 2021-12-08 NOTE — Progress Notes (Signed)
Subjective:    Patient ID: Kylie Ward, female    DOB: 10/06/1946, 76 y.o.   MRN: [id number]  Chief Complaint: medical management of chronic issues     HPI:  Kylie Ward is a 76 y.o. who identifies as a female who was assigned female at birth.   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 Essential hypertension, benign No c/o chest pan, sob or headache. Does not check blood pressure at home. BP Readings from Last 3 Encounters:  09/07/21 126/75  05/11/21 (!) 84/61  01/16/21 101/71      2. Symptomatic stenosis of both carotid arteries Bil carotid bruits. Doppler study was ordered but patient refused to have done.  3. Atherosclerosis of aorta ([place]) Has not seen cardiology in several years. Does not feel need to follow up with them at this time.  4. Late effect of cerebrovascular accident (CVA) No permanent effects that she is aware of.  5. Hyperlipidemia with target LDL less than 100 She doe snot watch diet and does no dedicated exercise. Lab Results  Component Value Date   CHOL 181 09/07/2021   HDL 36 (L) 09/07/2021   LDLCALC 91 09/07/2021   TRIG 325 (H) 09/07/2021   CHOLHDL 5.0 (H) 09/07/2021     6. Type 2 diabetes mellitus with diabetic polyneuropathy, with long-term current use of insulin (HCC) Patient fasting blood sugars are running around 150. She is very noncompliant with her diet. Referral was made for her to see clinical pharmacist, but she did not keep appointment. Lab Results  Component Value Date   HGBA1C 8.7 (H) 09/07/2021     7. GAD (generalized anxiety disorder) She is on xanax BID and has been for many years. GAD 7 : Generalized Anxiety Score 12/08/2021 09/07/2021 05/11/2021 01/11/2020  Nervous, Anxious, on Edge '1 1 1 3  ' Control/stop worrying '1 1 2 2  ' Worry too much - different things '1 1 1 3  ' Trouble relaxing '1 1 1 1  ' Restless 1 0 1 0   Easily annoyed or irritable 1 0 2 2  Afraid - awful might happen 0 0 1 2  Total GAD 7 Score '6 4 9 13  ' Anxiety Difficulty Not difficult at all Somewhat difficult Not difficult at all Somewhat difficult     8. Peripheral edema Has some edema daily. Is on lasix.  9. BMI 31.0-31.9,adult Weight is up 3 lbs. Wt Readings from Last 3 Encounters:  12/08/21 167 lb 9.6 oz (76 kg)  09/07/21 164 lb (74.4 kg)  05/11/21 167 lb (75.8 kg)   BMI Readings from Last 3 Encounters:  12/08/21 31.67 kg/m  09/07/21 30.99 kg/m  05/11/21 31.55 kg/m      New complaints: None today  Allergies  Allergen Reactions   Lipitor [Atorvastatin]     Joint pain / myalgias    Niaspan [Niacin Er]     Burn and itch all over   Outpatient Encounter Medications as of 12/08/2021  Medication Sig   Accu-Chek FastClix Lancets MISC CHECK BLOOD SUGAR UP TO 3 TIMES A DAY AS DIRECTED Dx E11.42   alendronate (FOSAMAX) 70 MG tablet Take 1 tablet (70 mg total) by mouth once a week. Take with a full glass of water on an empty stomach.   ALPRAZolam (XANAX) 1 MG tablet TAKE 1 TABLET 2 TIMES A DAY   aspirin 81 MG tablet Take 81 mg by  mouth daily.   Blood Glucose Monitoring Suppl (ACCU-CHEK NANO SMARTVIEW) w/Device KIT CHECK BLOOD SUGER UP TO 3 TIMES A DAY   Dulaglutide (TRULICITY) 1.5 XI/3.3AS SOPN Inject 1.5 mg into the skin once a week.   Elastic Bandages & Supports (V-2 HIGH COMPRESSION HOSE) MISC 1 each by Does not apply route daily.   escitalopram (LEXAPRO) 5 MG tablet Take 1 tablet (5 mg total) by mouth daily.   fenofibrate 160 MG tablet Take 1 tablet (160 mg total) by mouth daily.   furosemide (LASIX) 40 MG tablet Take 1 tablet (40 mg total) by mouth daily.   glucose blood (ACCU-CHEK SMARTVIEW) test strip CHECK BLOOD SUGAR UP TO 3 TIMES A DAY AS DIRECTED   insulin glargine (LANTUS) 100 UNIT/ML injection Inject 0.65 mLs (65 Units total) into the skin at bedtime.   insulin lispro (HUMALOG) 100 UNIT/ML injection  Sliding scale up to 12u TID   Insulin Pen Needle (SURE COMFORT PEN NEEDLES) 31G X 8 MM MISC USE TO inject lantus ONCE daily   lisinopril (ZESTRIL) 20 MG tablet Take 1 tablet (20 mg total) by mouth daily.   metFORMIN (GLUCOPHAGE-XR) 750 MG 24 hr tablet Take 1 tablet (750 mg total) by mouth 2 (two) times daily with a meal.   mirabegron ER (MYRBETRIQ) 25 MG TB24 tablet Take 1 tablet (25 mg total) by mouth daily.   ondansetron (ZOFRAN) 4 MG tablet Take 1 tablet (4 mg total) by mouth every 8 (eight) hours as needed for nausea or vomiting.   simvastatin (ZOCOR) 80 MG tablet Take 1 tablet (80 mg total) by mouth daily.   No facility-administered encounter medications on file as of 12/08/2021.    Past Surgical History:  Procedure Laterality Date   CAROTID ENDARTERECTOMY Right    CAROTID STENT      Family History  Problem Relation Age of Onset   Heart disease Father    Heart attack Father    Hypertension Father    Diabetes Sister    Stroke Sister    Diabetes Brother    Diabetes Sister    Diabetes Sister    Diabetes Brother    Diabetes Brother    Diabetes Brother       Controlled substance contract: 05/14/21     Review of Systems  Constitutional:  Negative for diaphoresis.  Eyes:  Negative for pain.  Respiratory:  Negative for shortness of breath.   Cardiovascular:  Negative for chest pain, palpitations and leg swelling.  Gastrointestinal:  Negative for abdominal pain.  Endocrine: Negative for polydipsia.  Skin:  Negative for rash.  Neurological:  Negative for dizziness, weakness and headaches.  Hematological:  Does not bruise/bleed easily.  All other systems reviewed and are negative.     Objective:   Physical Exam Vitals and nursing note reviewed.  Constitutional:      General: She is not in acute distress.    Appearance: Normal appearance. She is well-developed.  HENT:     Head: Normocephalic.     Right Ear: Tympanic membrane normal.     Left Ear: Tympanic membrane  normal.     Nose: Nose normal.     Mouth/Throat:     Mouth: Mucous membranes are moist.  Eyes:     Pupils: Pupils are equal, round, and reactive to light.  Neck:     Vascular: No carotid bruit or JVD.  Cardiovascular:     Rate and Rhythm: Normal rate and regular rhythm.     Heart sounds: Normal  heart sounds.     Comments: bil carotid bruits Pulmonary:     Effort: Pulmonary effort is normal. No respiratory distress.     Breath sounds: Normal breath sounds. No wheezing or rales.  Chest:     Chest wall: No tenderness.  Abdominal:     General: Bowel sounds are normal. There is no distension or abdominal bruit.     Palpations: Abdomen is soft. There is no hepatomegaly, splenomegaly, mass or pulsatile mass.     Tenderness: There is no abdominal tenderness.  Musculoskeletal:        General: Normal range of motion.     Cervical back: Normal range of motion and neck supple.     Right lower leg: Edema (1+) present.     Left lower leg: Edema (1+) present.  Lymphadenopathy:     Cervical: No cervical adenopathy.  Skin:    General: Skin is warm and dry.  Neurological:     Mental Status: She is alert and oriented to person, place, and time.     Deep Tendon Reflexes: Reflexes are normal and symmetric.  Psychiatric:        Behavior: Behavior normal.        Thought Content: Thought content normal.        Judgment: Judgment normal.    BP (!) 114/58    Pulse 88    Temp 98.4 F (36.9 C)    Ht '5\' 1"'  (1.549 m)    Wt 167 lb 9.6 oz (76 kg)    LMP 01/27/1983    SpO2 93%    BMI 31.67 kg/m   HGBA1c 6.8% on one machine- had repeated and was 8.6%      Assessment & Plan:  Kylie Ward comes in today with chief complaint of Medical Management of Chronic Issues   Diagnosis and orders addressed:  1. Essential hypertension, benign Low sodium diet - lisinopril (ZESTRIL) 20 MG tablet; Take 1 tablet (20 mg total) by mouth daily.  Dispense: 30 tablet; Refill: 5  2. Symptomatic stenosis of both  carotid arteries Refuses doppler study  3. Atherosclerosis of aorta ([place]) Refuses to see cardiology  4. Late effect of cerebrovascular accident (CVA)  5. Hyperlipidemia with target LDL less than 100 Low fat diet - simvastatin (ZOCOR) 80 MG tablet; Take 1 tablet (80 mg total) by mouth daily.  Dispense: 90 tablet; Refill: 1 - fenofibrate 160 MG tablet; Take 1 tablet (160 mg total) by mouth daily.  Dispense: 90 tablet; Refill: 1  6. Type 2 diabetes mellitus with diabetic polyneuropathy, with long-term current use of insulin (HCC) Continue to watch carbs in diet - CBC with Differential/Platelet - CMP14+EGFR - Lipid panel - Bayer DCA Hb A1c Waived - metFORMIN (GLUCOPHAGE-XR) 750 MG 24 hr tablet; Take 1 tablet (750 mg total) by mouth 2 (two) times daily with a meal.  Dispense: 180 tablet; Refill: 1 - insulin glargine (LANTUS) 100 UNIT/ML injection; Inject 0.65 mLs (65 Units total) into the skin at bedtime.  Dispense: 80 mL; Refill: 5 - Dulaglutide (TRULICITY) 1.5 ZD/6.3OV SOPN; Inject 1.5 mg into the skin once a week.  Dispense: 2 mL; Refill: 3  7. GAD (generalized anxiety disorder) Stress management - ALPRAZolam (XANAX) 1 MG tablet; Take 1 tablet (1 mg total) by mouth 2 (two) times daily.  Dispense: 60 tablet; Refill: 2 - escitalopram (LEXAPRO) 5 MG tablet; Take 1 tablet (5 mg total) by mouth daily.  Dispense: 30 tablet; Refill: 3  8. Peripheral edema Elevate legs  when sitting Wear compression socks - furosemide (LASIX) 40 MG tablet; Take 1 tablet (40 mg total) by mouth daily.  Dispense: 90 tablet; Refill: 1  9. BMI 31.0-31.9,adult Discussed diet and exercise for person with BMI >25 Will recheck weight in 3-6 months   10. Urinary urgency - mirabegron ER (MYRBETRIQ) 25 MG TB24 tablet; Take 1 tablet (25 mg total) by mouth daily.  Dispense: 30 tablet; Refill: 5   Labs pending Health Maintenance reviewed Diet and exercise encouraged  Follow up plan: 3 months   Mary-Margaret  Hassell Done, FNP

## 2021-12-08 NOTE — Patient Instructions (Signed)

## 2021-12-09 LAB — CBC WITH DIFFERENTIAL/PLATELET
Basophils Absolute: 0.1 10*3/uL (ref 0.0–0.2)
Basos: 1 %
EOS (ABSOLUTE): 0.3 10*3/uL (ref 0.0–0.4)
Eos: 2 %
Hematocrit: 38.7 % (ref 34.0–46.6)
Hemoglobin: 12.9 g/dL (ref 11.1–15.9)
Immature Grans (Abs): 0 10*3/uL (ref 0.0–0.1)
Immature Granulocytes: 0 %
Lymphocytes Absolute: 2.7 10*3/uL (ref 0.7–3.1)
Lymphs: 21 %
MCH: 31.1 pg (ref 26.6–33.0)
MCHC: 33.3 g/dL (ref 31.5–35.7)
MCV: 93 fL (ref 79–97)
Monocytes Absolute: 0.7 10*3/uL (ref 0.1–0.9)
Monocytes: 6 %
Neutrophils Absolute: 9.3 10*3/uL — ABNORMAL HIGH (ref 1.4–7.0)
Neutrophils: 70 %
Platelets: 278 10*3/uL (ref 150–450)
RBC: 4.15 x10E6/uL (ref 3.77–5.28)
RDW: 12.3 % (ref 11.7–15.4)
WBC: 13.1 10*3/uL — ABNORMAL HIGH (ref 3.4–10.8)

## 2021-12-09 LAB — CMP14+EGFR
ALT: 24 IU/L (ref 0–32)
AST: 39 IU/L (ref 0–40)
Albumin/Globulin Ratio: 2.2 (ref 1.2–2.2)
Albumin: 4.4 g/dL (ref 3.7–4.7)
Alkaline Phosphatase: 71 IU/L (ref 44–121)
BUN/Creatinine Ratio: 25 (ref 12–28)
BUN: 32 mg/dL — ABNORMAL HIGH (ref 8–27)
Bilirubin Total: <0.2 mg/dL (ref 0.0–1.2)
CO2: 23 mmol/L (ref 20–29)
Calcium: 9.4 mg/dL (ref 8.7–10.3)
Chloride: 105 mmol/L (ref 96–106)
Creatinine, Ser: 1.27 mg/dL — ABNORMAL HIGH (ref 0.57–1.00)
Globulin, Total: 2 g/dL (ref 1.5–4.5)
Glucose: 58 mg/dL — ABNORMAL LOW (ref 70–99)
Potassium: 5.5 mmol/L — ABNORMAL HIGH (ref 3.5–5.2)
Sodium: 144 mmol/L (ref 134–144)
Total Protein: 6.4 g/dL (ref 6.0–8.5)
eGFR: 45 mL/min/{1.73_m2} — ABNORMAL LOW (ref >59)

## 2021-12-09 LAB — LIPID PANEL
Chol/HDL Ratio: 4 ratio (ref 0.0–4.4)
Cholesterol, Total: 164 mg/dL (ref 100–199)
HDL: 41 mg/dL (ref >39)
LDL Chol Calc (NIH): 91 mg/dL (ref 0–99)
Triglycerides: 186 mg/dL — ABNORMAL HIGH (ref 0–149)
VLDL Cholesterol Cal: 32 mg/dL (ref 5–40)

## 2022-02-13 ENCOUNTER — Other Ambulatory Visit: Payer: Self-pay | Admitting: Nurse Practitioner

## 2022-03-05 ENCOUNTER — Other Ambulatory Visit: Payer: Self-pay

## 2022-03-05 ENCOUNTER — Telehealth: Payer: Self-pay | Admitting: Nurse Practitioner

## 2022-03-05 DIAGNOSIS — F411 Generalized anxiety disorder: Secondary | ICD-10-CM

## 2022-03-05 MED ORDER — ALPRAZOLAM 1 MG PO TABS: 1.0000 mg | ORAL_TABLET | Freq: Two times a day (BID) | ORAL | 0 refills | Status: DC

## 2022-03-05 NOTE — Telephone Encounter (Signed)
Husband notified that refill sent to pharmacy but not sure if pharmacy would fill yet. Depends on when her last refill was. Husband stated he would contact the pharmacy/.  ?

## 2022-03-05 NOTE — Telephone Encounter (Signed)
Husband calling to let us know that patient is out of ALPRAZolam Prudy Feeler) 1 MG tablet. ?He is unsure if she threw them in the trash when she was cleaning out medicine cabinet but she does not have anymore and pharmacy is unable to refill without a new prescription. Please call back. ? ?

## 2022-03-05 NOTE — Telephone Encounter (Signed)
Xanax refilled.  

## 2022-03-05 NOTE — Telephone Encounter (Signed)
[facility] states that they cannot get alprazolam 1mg  at this time. They are thinking they may receive a shipment in 2 weeks. According to them their is a . They do have some alprazolam 0.5 on hand and ask that we send over two weeks worth and then they will attempt to fill her 1mg  rx.  ?

## 2022-03-06 MED ORDER — ALPRAZOLAM 0.5 MG PO TABS: ORAL_TABLET | ORAL | 0 refills | Status: DC

## 2022-03-08 ENCOUNTER — Ambulatory Visit: Payer: Medicare Other | Admitting: Nurse Practitioner

## 2022-03-10 ENCOUNTER — Telehealth: Payer: Self-pay | Admitting: Nurse Practitioner

## 2022-03-10 NOTE — Telephone Encounter (Signed)
Pts daughter called stating that she knows pt has an appt with MMM on 5/22 and wanted to let MMM know that pt is showing signs of dementia and the family is very worried about pt. Says they cant talk to pt about it anymore because pt gets very upset when they try to talk to her. Says pt gets very agitated, mean, and paranoid. Also says she sees things that are not there and thinks about things that does not exist. Even thought her birthday was a completely different month day and year. ? ?Wants to know if MMM can talk to her about it at appt or do a dementia screening? ?

## 2022-03-22 ENCOUNTER — Telehealth: Payer: Self-pay | Admitting: Nurse Practitioner

## 2022-03-22 ENCOUNTER — Encounter: Payer: Self-pay | Admitting: Nurse Practitioner

## 2022-03-22 ENCOUNTER — Ambulatory Visit (INDEPENDENT_AMBULATORY_CARE_PROVIDER_SITE_OTHER): Payer: Medicare Other | Admitting: Nurse Practitioner

## 2022-03-22 VITALS — BP 127/71 | HR 77 | Temp 97.3°F | Resp 20 | Ht 61.0 in | Wt 150.0 lb

## 2022-03-22 DIAGNOSIS — I6930 Unspecified sequelae of cerebral infarction: Secondary | ICD-10-CM | POA: Diagnosis not present

## 2022-03-22 DIAGNOSIS — F01B0 Vascular dementia, moderate, without behavioral disturbance, psychotic disturbance, mood disturbance, and anxiety: Secondary | ICD-10-CM

## 2022-03-22 DIAGNOSIS — Z1212 Encounter for screening for malignant neoplasm of rectum: Secondary | ICD-10-CM

## 2022-03-22 DIAGNOSIS — Z794 Long term (current) use of insulin: Secondary | ICD-10-CM | POA: Diagnosis not present

## 2022-03-22 DIAGNOSIS — I10 Essential (primary) hypertension: Secondary | ICD-10-CM

## 2022-03-22 DIAGNOSIS — R609 Edema, unspecified: Secondary | ICD-10-CM | POA: Diagnosis not present

## 2022-03-22 DIAGNOSIS — Z6828 Body mass index (BMI) 28.0-28.9, adult: Secondary | ICD-10-CM

## 2022-03-22 DIAGNOSIS — E1142 Type 2 diabetes mellitus with diabetic polyneuropathy: Secondary | ICD-10-CM

## 2022-03-22 DIAGNOSIS — E785 Hyperlipidemia, unspecified: Secondary | ICD-10-CM | POA: Diagnosis not present

## 2022-03-22 DIAGNOSIS — R3915 Urgency of urination: Secondary | ICD-10-CM

## 2022-03-22 DIAGNOSIS — Z23 Encounter for immunization: Secondary | ICD-10-CM

## 2022-03-22 DIAGNOSIS — Z1211 Encounter for screening for malignant neoplasm of colon: Secondary | ICD-10-CM

## 2022-03-22 DIAGNOSIS — I700 Atherosclerosis of aorta: Secondary | ICD-10-CM

## 2022-03-22 DIAGNOSIS — I6523 Occlusion and stenosis of bilateral carotid arteries: Secondary | ICD-10-CM | POA: Diagnosis not present

## 2022-03-22 DIAGNOSIS — F339 Major depressive disorder, recurrent, unspecified: Secondary | ICD-10-CM

## 2022-03-22 DIAGNOSIS — F411 Generalized anxiety disorder: Secondary | ICD-10-CM

## 2022-03-22 LAB — BAYER DCA HB A1C WAIVED: HB A1C (BAYER DCA - WAIVED): 7 % — ABNORMAL HIGH (ref 4.8–5.6)

## 2022-03-22 MED ORDER — SIMVASTATIN 80 MG PO TABS: 80.0000 mg | ORAL_TABLET | Freq: Every day | ORAL | 1 refills | Status: DC

## 2022-03-22 MED ORDER — MIRABEGRON ER 25 MG PO TB24: 25.0000 mg | ORAL_TABLET | Freq: Every day | ORAL | 5 refills | Status: DC

## 2022-03-22 MED ORDER — INSULIN GLARGINE 100 UNIT/ML ~~LOC~~ SOLN: 65.0000 [IU] | Freq: Every day | SUBCUTANEOUS | 5 refills | Status: DC

## 2022-03-22 MED ORDER — CITALOPRAM HYDROBROMIDE 20 MG PO TABS: 20.0000 mg | ORAL_TABLET | Freq: Every day | ORAL | 1 refills | Status: DC

## 2022-03-22 MED ORDER — METFORMIN HCL ER 750 MG PO TB24: 750.0000 mg | ORAL_TABLET | Freq: Two times a day (BID) | ORAL | 1 refills | Status: DC

## 2022-03-22 MED ORDER — ESCITALOPRAM OXALATE 5 MG PO TABS: 5.0000 mg | ORAL_TABLET | Freq: Every day | ORAL | 3 refills | Status: DC

## 2022-03-22 MED ORDER — ALPRAZOLAM 1 MG PO TABS: 1.0000 mg | ORAL_TABLET | Freq: Two times a day (BID) | ORAL | 0 refills | Status: DC

## 2022-03-22 MED ORDER — FENOFIBRATE 160 MG PO TABS: 160.0000 mg | ORAL_TABLET | Freq: Every day | ORAL | 1 refills | Status: DC

## 2022-03-22 MED ORDER — ESCITALOPRAM OXALATE 10 MG PO TABS: 10.0000 mg | ORAL_TABLET | Freq: Every day | ORAL | 5 refills | Status: DC

## 2022-03-22 MED ORDER — TRULICITY 1.5 MG/0.5ML ~~LOC~~ SOAJ: 1.5000 mg | SUBCUTANEOUS | 3 refills | Status: DC

## 2022-03-22 MED ORDER — DONEPEZIL HCL 10 MG PO TABS: 10.0000 mg | ORAL_TABLET | Freq: Every day | ORAL | 1 refills | Status: DC

## 2022-03-22 MED ORDER — FUROSEMIDE 40 MG PO TABS: 40.0000 mg | ORAL_TABLET | Freq: Every day | ORAL | 1 refills | Status: DC

## 2022-03-22 MED ORDER — LISINOPRIL 20 MG PO TABS: 20.0000 mg | ORAL_TABLET | Freq: Every day | ORAL | 5 refills | Status: DC

## 2022-03-22 NOTE — Telephone Encounter (Signed)
Daughter is calling about this message again. Daughter wants MMM to ask pt about possible dementia without her knowing that daughter called. Daughter says that pt gets upset when asked about it. Daughter Angelica Chessman aware she is not on the DPR, but wants MMM to be aware of what is going on.

## 2022-03-22 NOTE — Progress Notes (Signed)
Subjective:    Patient ID: Kylie Ward, female    DOB: Aug 04, 1946, 76 y.o.   MRN: [id number]   Chief Complaint: No orders of the defined types were placed in this encounter.     HPI:  Kylie Ward is a 76 y.o. who identifies as a female who was assigned female at birth.   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 Essential hypertension, benign No c/o chest pain, sob or headache. Does not check blood pressure at h ome. BP Readings from Last 3 Encounters:  12/08/21 (!) 114/58  09/07/21 126/75  05/11/21 (!) 84/61     2. Atherosclerosis of aorta ([place]) Seen on chest xray- we continue to watch for changes.  3. Symptomatic stenosis of both carotid arteries Doppler study was ordered back in November but has not been done. She did not show up for appointment. Will reorder  4. Type 2 diabetes mellitus with diabetic polyneuropathy, with long-term current use of insulin (HCC) She does not check her blood sugars daily. She does not watch her diet at all. Has numbness in bil feet, but no sore places. Lab Results  Component Value Date   HGBA1C 8.6 (H) 12/08/2021     5. Hyperlipidemia with target LDL less than 100 Does not watch diet and does no dedicated exercise Lab Results  Component Value Date   CHOL 164 12/08/2021   HDL 41 12/08/2021   LDLCALC 91 12/08/2021   TRIG 186 (H) 12/08/2021   CHOLHDL 4.0 12/08/2021     6. Late effect of cerebrovascular accident (CVA) No permanent effect  7. Peripheral edema Has daily by the end of the day. Wears knee high hose most of the time.  8. GAD (generalized anxiety disorder) Is on xanax BID.     03/22/2022    2:57 PM 12/08/2021    1:55 PM 09/07/2021    3:37 PM 05/11/2021    2:29 PM  GAD 7 : Generalized Anxiety Score  Nervous, Anxious, on Edge '1 1 1 1  ' Control/stop worrying '1 1 1 2  ' Worry too much - different things '2 1 1 1   ' Trouble relaxing '1 1 1 1  ' Restless 1 1 0 1  Easily annoyed or irritable 1 1 0 2  Afraid - awful might happen 0 0 0 1  Total GAD 7 Score '7 6 4 9  ' Anxiety Difficulty Somewhat difficult Not difficult at all Somewhat difficult Not difficult at all      03/22/2022    2:57 PM 12/08/2021    1:54 PM 09/07/2021    3:37 PM 05/11/2021    2:29 PM 05/07/2021    3:16 PM  Depression screen PHQ 2/9  Decreased Interest '2 1 2 1 1  ' Down, Depressed, Hopeless '2 1 3 2 1  ' PHQ - 2 Score '4 2 5 3 2  ' Altered sleeping '2 2 2 1 ' 0  Tired, decreased energy '2 1 1 2 ' 0  Change in appetite '1 1 2 ' 0 0  Feeling bad or failure about yourself  '1 1 3 3 2  ' Trouble concentrating 1 1 0 0 0  Moving slowly or fidgety/restless 1 1 0 1 0  Suicidal thoughts 0 0 0 0 0  PHQ-9 Score '12 9 13 10 4  ' Difficult doing work/chores Not difficult at all Not difficult at all Somewhat difficult Not difficult at all Somewhat difficult  9. BMI 31.0-31.9,adult Weight is down 17lbs.  Wt Readings from Last 3 Encounters:  03/22/22 150 lb (68 kg)  12/08/21 167 lb 9.6 oz (76 kg)  09/07/21 164 lb (74.4 kg)   BMI Readings from Last 3 Encounters:  03/22/22 28.34 kg/m  12/08/21 31.67 kg/m  09/07/21 30.99 kg/m      New complaints: Her daughter has called on several occasion stating that her moms dementia has worsened and she would like her to be evaluated.  Allergies  Allergen Reactions   Lipitor [Atorvastatin]     Joint pain / myalgias    Niaspan [Niacin Er]     Burn and itch all over   Outpatient Encounter Medications as of 03/22/2022  Medication Sig   Accu-Chek FastClix Lancets MISC CHECK BLOOD SUGAR UP TO 3 TIMES A DAY AS DIRECTED Dx E11.42   alendronate (FOSAMAX) 70 MG tablet Take 1 tablet (70 mg total) by mouth once a week. Take with a full glass of water on an empty stomach.   ALPRAZolam (XANAX) 0.5 MG tablet Take 2 tablets two times a day   ALPRAZolam (XANAX) 1 MG tablet Take 1 tablet (1 mg total) by mouth 2 (two) times  daily.   aspirin 81 MG tablet Take 81 mg by mouth daily.   Blood Glucose Monitoring Suppl (ACCU-CHEK NANO SMARTVIEW) w/Device KIT CHECK BLOOD SUGER UP TO 3 TIMES A DAY   Dulaglutide (TRULICITY) 1.5 BT/5.1VO SOPN Inject 1.5 mg into the skin once a week.   Elastic Bandages & Supports (V-2 HIGH COMPRESSION HOSE) MISC 1 each by Does not apply route daily.   escitalopram (LEXAPRO) 5 MG tablet Take 1 tablet (5 mg total) by mouth daily.   fenofibrate 160 MG tablet Take 1 tablet (160 mg total) by mouth daily.   furosemide (LASIX) 40 MG tablet Take 1 tablet (40 mg total) by mouth daily.   glucose blood (ACCU-CHEK SMARTVIEW) test strip CHECK BLOOD SUGAR UP TO 3 TIMES A DAY AS DIRECTED   insulin glargine (LANTUS) 100 UNIT/ML injection Inject 0.65 mLs (65 Units total) into the skin at bedtime.   insulin lispro (HUMALOG) 100 UNIT/ML injection Sliding scale up to 12u TID   Insulin Pen Needle (SURE COMFORT PEN NEEDLES) 31G X 8 MM MISC USE TO inject lantus ONCE daily   lisinopril (ZESTRIL) 20 MG tablet Take 1 tablet (20 mg total) by mouth daily.   metFORMIN (GLUCOPHAGE-XR) 750 MG 24 hr tablet Take 1 tablet (750 mg total) by mouth 2 (two) times daily with a meal.   mirabegron ER (MYRBETRIQ) 25 MG TB24 tablet Take 1 tablet (25 mg total) by mouth daily.   ondansetron (ZOFRAN) 4 MG tablet Take 1 tablet (4 mg total) by mouth every 8 (eight) hours as needed for nausea or vomiting.   simvastatin (ZOCOR) 80 MG tablet Take 1 tablet (80 mg total) by mouth daily.   No facility-administered encounter medications on file as of 03/22/2022.    Past Surgical History:  Procedure Laterality Date   CAROTID ENDARTERECTOMY Right    CAROTID STENT      Family History  Problem Relation Age of Onset   Heart disease Father    Heart attack Father    Hypertension Father    Diabetes Sister    Stroke Sister    Diabetes Brother    Diabetes Sister    Diabetes Sister    Diabetes Brother    Diabetes Brother    Diabetes Brother  Controlled substance contract: n/a  MME score of 15    Review of Systems  Constitutional:  Negative for diaphoresis.  Eyes:  Negative for pain.  Respiratory:  Negative for shortness of breath.   Cardiovascular:  Negative for chest pain, palpitations and leg swelling.  Gastrointestinal:  Negative for abdominal pain.  Endocrine: Negative for polydipsia.  Skin:  Negative for rash.  Neurological:  Negative for dizziness, weakness and headaches.  Hematological:  Does not bruise/bleed easily.  All other systems reviewed and are negative.     Objective:   Physical Exam Vitals and nursing note reviewed.  Constitutional:      General: She is not in acute distress.    Appearance: Normal appearance. She is well-developed.  HENT:     Head: Normocephalic.     Right Ear: Tympanic membrane normal.     Left Ear: Tympanic membrane normal.     Nose: Nose normal.     Mouth/Throat:     Mouth: Mucous membranes are moist.  Eyes:     Pupils: Pupils are equal, round, and reactive to light.  Neck:     Vascular: No carotid bruit or JVD.  Cardiovascular:     Rate and Rhythm: Normal rate and regular rhythm.     Heart sounds: Normal heart sounds.  Pulmonary:     Effort: Pulmonary effort is normal. No respiratory distress.     Breath sounds: Normal breath sounds. No wheezing or rales.  Chest:     Chest wall: No tenderness.  Abdominal:     General: Bowel sounds are normal. There is no distension or abdominal bruit.     Palpations: Abdomen is soft. There is no hepatomegaly, splenomegaly, mass or pulsatile mass.     Tenderness: There is no abdominal tenderness.  Musculoskeletal:        General: Normal range of motion.     Cervical back: Normal range of motion and neck supple.     Right lower leg: Edema (1+ foot) present.     Left lower leg: Edema (1+ foot) present.  Lymphadenopathy:     Cervical: No cervical adenopathy.  Skin:    General: Skin is warm and dry.  Neurological:      Mental Status: She is alert and oriented to person, place, and time.     Deep Tendon Reflexes: Reflexes are normal and symmetric.  Psychiatric:        Behavior: Behavior normal.        Thought Content: Thought content normal.        Judgment: Judgment normal.     Comments: Repeating herself alot    BP 127/71   Pulse 77   Temp (!) 97.3 F (36.3 C) (Temporal)   Resp 20   Ht '5\' 1"'  (1.549 m)   Wt 150 lb (68 kg)   LMP 01/27/1983   SpO2 98%   BMI 28.34 kg/m        Assessment & Plan:  INOCENCIA MURTAUGH comes in today with chief complaint of Medical Management of Chronic Issues   Diagnosis and orders addressed:  1. Essential hypertension, benign Low sodium diet - CMP14+EGFR - CBC with Differential/Platelet - lisinopril (ZESTRIL) 20 MG tablet; Take 1 tablet (20 mg total) by mouth daily.  Dispense: 30 tablet; Refill: 5  2. Atherosclerosis of aorta ([place])  3. Symptomatic stenosis of both carotid arteries Will reschedule doppler study  4. Type 2 diabetes mellitus with diabetic polyneuropathy, with long-term current use of insulin ([place]) Continue to  watch carbsn diet - Bayer DCA Hb A1c Waived - metFORMIN (GLUCOPHAGE-XR) 750 MG 24 hr tablet; Take 1 tablet (750 mg total) by mouth 2 (two) times daily with a meal.  Dispense: 180 tablet; Refill: 1 - insulin glargine (LANTUS) 100 UNIT/ML injection; Inject 0.65 mLs (65 Units total) into the skin at bedtime.  Dispense: 80 mL; Refill: 5 - Dulaglutide (TRULICITY) 1.5 JO/8.3GP SOPN; Inject 1.5 mg into the skin once a week.  Dispense: 2 mL; Refill: 3  5. Hyperlipidemia with target LDL less than 100 Low fat diet - Lipid panel - simvastatin (ZOCOR) 80 MG tablet; Take 1 tablet (80 mg total) by mouth daily.  Dispense: 90 tablet; Refill: 1 - fenofibrate 160 MG tablet; Take 1 tablet (160 mg total) by mouth daily.  Dispense: 90 tablet; Refill: 1  6. Late effect of cerebrovascular accident (CVA)  7. Peripheral edema Elevate legs when sitting -  furosemide (LASIX) 40 MG tablet; Take 1 tablet (40 mg total) by mouth daily.  Dispense: 90 tablet; Refill: 1  8. GAD (generalized anxiety disorder) Stress management - ALPRAZolam (XANAX) 1 MG tablet; Take 1 tablet (1 mg total) by mouth 2 (two) times daily.  Dispense: 60 tablet; Refill: 0 - escitalopram (LEXAPRO) 5 MG tablet; Take 1 tablet (5 mg total) by mouth daily.  Dispense: 30 tablet; Refill: 3  9. BMI 28.0-28.9,adult Discussed diet and exercise for person with BMI >25 Will recheck weight in 3-6 months   10. Moderate vascular dementia without behavioral disturbance, psychotic disturbance, mood disturbance, or anxiety (HCC) Added aricept to meds- will slow down progression but will not coreect things - donepezil (ARICEPT) 10 MG tablet; Take 1 tablet (10 mg total) by mouth at bedtime.  Dispense: 90 tablet; Refill: 1  11. Depression, recurrent ([place]) Stress management Added celexa - citalopram (CELEXA) 20 MG tablet; Take 1 tablet (20 mg total) by mouth daily.  Dispense: 90 tablet; Refill: 1  12. Urinary urgency - mirabegron ER (MYRBETRIQ) 25 MG TB24 tablet; Take 1 tablet (25 mg total) by mouth daily.  Dispense: 30 tablet; Refill: 5   Labs pending Health Maintenance reviewed Diet and exercise encouraged  Follow up plan: 3 months   Mary-Margaret Hassell Done, FNP

## 2022-03-22 NOTE — Patient Instructions (Signed)
Dementia Caregiver Guide Dementia is a term used to describe a number of symptoms that affect memory and thinking. The most common symptoms include: Memory loss. Trouble with language and communication. Trouble concentrating. Poor judgment and problems with reasoning. Wandering from home or public places. Extreme anxiety or depression. Being suspicious or having angry outbursts and accusations. Child-like behavior and language. Dementia can be frightening and confusing. And taking care of someone with dementia can be challenging. This guide provides tips to help you when providing care for a person with dementia. How to help manage lifestyle changes Dementia usually gets worse slowly over time. In the early stages, people with dementia can stay independent and safe with some help. In later stages, they need help with daily tasks such as dressing, grooming, and using the bathroom. There are actions you can take to help a person manage his or her life while living with this condition. Communicating When the person is talking or seems frustrated, make eye contact and hold the person's hand. Ask specific questions that need yes or no answers. Use simple words, short sentences, and a calm voice. Only give one direction at a time. When offering choices, limit the person to just one or two. Avoid correcting the person in a negative way. If the person is struggling to find the right words, gently try to help him or her. Preventing injury  Keep floors clear of clutter. Remove rugs, magazine racks, and floor lamps. Keep hallways well lit, especially at night. Put a handrail and nonslip mat in the bathtub or shower. Put childproof locks on cabinets that contain dangerous items, such as medicines, alcohol, guns, toxic cleaning items, sharp tools or utensils, matches, and lighters. For doors to the outside of the house, put the locks in places where the person cannot see or reach them easily. This will  help ensure that the person does not wander out of the house and get lost. Be prepared for emergencies. Keep a list of emergency phone numbers and addresses in a convenient area. Remove car keys and lock garage doors so that the person does not try to get in the car and drive. Have the person wear a bracelet that tracks locations and identifies the person as having memory problems. This should be worn at all times for safety. Helping with daily life  Keep the person on track with his or her routine. Try to identify areas where the person may need help. Be supportive, patient, calm, and encouraging. Gently remind the person that adjusting to changes takes time. Help with the tasks that the person has asked for help with. Keep the person involved in daily tasks and decisions as much as possible. Encourage conversation, but try not to get frustrated if the person struggles to find words or does not seem to appreciate your help. How to recognize stress Look for signs of stress in yourself and in the person you are caring for. If you notice signs of stress, take steps to manage it. Symptoms of stress include: Feeling anxious, irritable, frustrated, or angry. Denying that the person has dementia or that his or her symptoms will not improve. Feeling depressed, hopeless, or unappreciated. Difficulty sleeping. Difficulty concentrating. Developing stress-related health problems. Feeling like you have too little time for your own life. Follow these instructions at home: Take care of your health Make sure that you and the person you are caring for: Get regular sleep. Exercise regularly. Eat regular, nutritious meals. Take over-the-counter and prescription medicines only   as told by your health care providers. Drink enough fluid to keep your urine pale yellow. Attend all scheduled health care appointments.  General instructions Join a support group with others who are caregivers. Ask about  respite care resources. Respite care can provide short-term care for the person so that you can have a regular break from the stress of caregiving. Consider any safety risks and take steps to avoid them. Organize medicines in a pill box for each day of the week. Create a plan to handle any legal or financial matters. Get legal or financial advice if needed. Keep a calendar in a central location to remind the person of appointments or other activities. Where to find support: Many individuals and organizations offer support. These include: Support groups for people with dementia. Support groups for caregivers. Counselors or therapists. Home health care services. Adult day care centers. Where to find more information Centers for Disease Control and Prevention: www.cdc.gov Alzheimer's Association: www.alz.org Family Caregiver Alliance: www.caregiver.org Alzheimer's Foundation of [place]: www.alzfdn.org Contact a health care provider if: The person's health is rapidly getting worse. You are no longer able to care for the person. Caring for the person is affecting your physical and emotional health. You are feeling depressed or anxious about caring for the person. Get help right away if: The person threatens himself or herself, you, or anyone else. You feel depressed or sad, or feel that you want to harm yourself. If you ever feel like your loved one may hurt himself or herself or others, or if he or she shares thoughts about taking his or her own life, get help right away. You can go to your nearest emergency department or: Call your local emergency services (911 in the [place] Call a suicide crisis helpline, such as the National Suicide Prevention Lifeline at 1-800-273-8255 or 988 in the [place] This is open 24 hours a day in the [place] Text the Crisis Text Line at 741741 (in the [place] Summary Dementia is a term used to describe a number of symptoms that affect memory and thinking. Dementia  usually gets worse slowly over time. Take steps to reduce the person's risk of injury and to plan for future care. Caregivers need support, relief from caregiving, and time for their own lives. This information is not intended to replace advice given to you by your health care provider. Make sure you discuss any questions you have with your health care provider. Document Revised: 05/13/2021 Document Reviewed: 03/03/2020 Elsevier Patient Education  2023 [employer]  

## 2022-03-22 NOTE — Addendum Note (Signed)
Addended by: Chevis Pretty on: 03/22/2022 03:52 PM   Modules accepted: Orders

## 2022-03-23 LAB — CBC WITH DIFFERENTIAL/PLATELET
Basophils Absolute: 0 10*3/uL (ref 0.0–0.2)
Basos: 0 %
EOS (ABSOLUTE): 0.1 10*3/uL (ref 0.0–0.4)
Eos: 1 %
Hematocrit: 38.1 % (ref 34.0–46.6)
Hemoglobin: 12.7 g/dL (ref 11.1–15.9)
Immature Grans (Abs): 0 10*3/uL (ref 0.0–0.1)
Immature Granulocytes: 0 %
Lymphocytes Absolute: 1.5 10*3/uL (ref 0.7–3.1)
Lymphs: 12 %
MCH: 29.8 pg (ref 26.6–33.0)
MCHC: 33.3 g/dL (ref 31.5–35.7)
MCV: 89 fL (ref 79–97)
Monocytes Absolute: 1.1 10*3/uL — ABNORMAL HIGH (ref 0.1–0.9)
Monocytes: 9 %
Neutrophils Absolute: 9.5 10*3/uL — ABNORMAL HIGH (ref 1.4–7.0)
Neutrophils: 78 %
Platelets: 270 10*3/uL (ref 150–450)
RBC: 4.26 x10E6/uL (ref 3.77–5.28)
RDW: 12.3 % (ref 11.7–15.4)
WBC: 12.4 10*3/uL — ABNORMAL HIGH (ref 3.4–10.8)

## 2022-03-23 LAB — LIPID PANEL
Chol/HDL Ratio: 6.3 ratio — ABNORMAL HIGH (ref 0.0–4.4)
Cholesterol, Total: 259 mg/dL — ABNORMAL HIGH (ref 100–199)
HDL: 41 mg/dL (ref >39)
LDL Chol Calc (NIH): 173 mg/dL — ABNORMAL HIGH (ref 0–99)
Triglycerides: 239 mg/dL — ABNORMAL HIGH (ref 0–149)
VLDL Cholesterol Cal: 45 mg/dL — ABNORMAL HIGH (ref 5–40)

## 2022-03-23 LAB — CMP14+EGFR
ALT: 25 IU/L (ref 0–32)
AST: 28 IU/L (ref 0–40)
Albumin/Globulin Ratio: 1.5 (ref 1.2–2.2)
Albumin: 4.1 g/dL (ref 3.7–4.7)
Alkaline Phosphatase: 84 IU/L (ref 44–121)
BUN/Creatinine Ratio: 24 (ref 12–28)
BUN: 26 mg/dL (ref 8–27)
Bilirubin Total: 0.4 mg/dL (ref 0.0–1.2)
CO2: 21 mmol/L (ref 20–29)
Calcium: 9.5 mg/dL (ref 8.7–10.3)
Chloride: 95 mmol/L — ABNORMAL LOW (ref 96–106)
Creatinine, Ser: 1.1 mg/dL — ABNORMAL HIGH (ref 0.57–1.00)
Globulin, Total: 2.8 g/dL (ref 1.5–4.5)
Glucose: 160 mg/dL — ABNORMAL HIGH (ref 70–99)
Potassium: 4.2 mmol/L (ref 3.5–5.2)
Sodium: 135 mmol/L (ref 134–144)
Total Protein: 6.9 g/dL (ref 6.0–8.5)
eGFR: 53 mL/min/{1.73_m2} — ABNORMAL LOW (ref >59)

## 2022-03-23 NOTE — Telephone Encounter (Signed)
Spoke with daughter on phone. Answered all of her questions

## 2022-04-08 DIAGNOSIS — Z1211 Encounter for screening for malignant neoplasm of colon: Secondary | ICD-10-CM | POA: Diagnosis not present

## 2022-04-08 DIAGNOSIS — Z1212 Encounter for screening for malignant neoplasm of rectum: Secondary | ICD-10-CM | POA: Diagnosis not present

## 2022-04-16 LAB — COLOGUARD: COLOGUARD: POSITIVE — AB

## 2022-05-03 NOTE — Addendum Note (Signed)
Addended by: Cleda Daub on: 05/03/2022 02:10 PM   Modules accepted: Orders

## 2022-05-10 ENCOUNTER — Ambulatory Visit (INDEPENDENT_AMBULATORY_CARE_PROVIDER_SITE_OTHER): Payer: Medicare Other

## 2022-05-10 VITALS — Ht 62.0 in | Wt 150.0 lb

## 2022-05-10 DIAGNOSIS — Z0000 Encounter for general adult medical examination without abnormal findings: Secondary | ICD-10-CM

## 2022-05-10 NOTE — Patient Instructions (Signed)
Kylie Ward , Thank you for taking time to come for your Medicare Wellness Visit. I appreciate your ongoing commitment to your health goals. Please review the following plan we discussed and let me know if I can assist you in the future.   Screening recommendations/referrals: Colonoscopy: cologuard 04/08/2022 Mammogram: n/a Bone Density: completed 01/16/2021 Recommended yearly ophthalmology/optometry visit for glaucoma screening and checkup Recommended yearly dental visit for hygiene and checkup  Vaccinations: Influenza vaccine: due 06/01/2022 Pneumococcal vaccine: completed 01/09/2019 Tdap vaccine: due Shingles vaccine: needs second dose   Covid-19: 03/25/2020, 02/26/2020  Advanced directives: Advance directive discussed with you today.   Conditions/risks identified: none  Next appointment: Follow up in one year for your annual wellness visit    Preventive Care 65 Years and Older, Female Preventive care refers to lifestyle choices and visits with your health care provider that can promote health and wellness. What does preventive care include? A yearly physical exam. This is also called an annual well check. Dental exams once or twice a year. Routine eye exams. Ask your health care provider how often you should have your eyes checked. Personal lifestyle choices, including: Daily care of your teeth and gums. Regular physical activity. Eating a healthy diet. Avoiding tobacco and drug use. Limiting alcohol use. Practicing safe sex. Taking low-dose aspirin every day. Taking vitamin and mineral supplements as recommended by your health care provider. What happens during an annual well check? The services and screenings done by your health care provider during your annual well check will depend on your age, overall health, lifestyle risk factors, and family history of disease. Counseling  Your health care provider may ask you questions about your: Alcohol use. Tobacco use. Drug  use. Emotional well-being. Home and relationship well-being. Sexual activity. Eating habits. History of falls. Memory and ability to understand (cognition). Work and work Astronomer. Reproductive health. Screening  You may have the following tests or measurements: Height, weight, and BMI. Blood pressure. Lipid and cholesterol levels. These may be checked every 5 years, or more frequently if you are over 39 years old. Skin check. Lung cancer screening. You may have this screening every year starting at age 61 if you have a 30-pack-year history of smoking and currently smoke or have quit within the past 15 years. Fecal occult blood test (FOBT) of the stool. You may have this test every year starting at age 94. Flexible sigmoidoscopy or colonoscopy. You may have a sigmoidoscopy every 5 years or a colonoscopy every 10 years starting at age 42. Hepatitis C blood test. Hepatitis B blood test. Sexually transmitted disease (STD) testing. Diabetes screening. This is done by checking your blood sugar (glucose) after you have not eaten for a while (fasting). You may have this done every 1-3 years. Bone density scan. This is done to screen for osteoporosis. You may have this done starting at age 32. Mammogram. This may be done every 1-2 years. Talk to your health care provider about how often you should have regular mammograms. Talk with your health care provider about your test results, treatment options, and if necessary, the need for more tests. Vaccines  Your health care provider may recommend certain vaccines, such as: Influenza vaccine. This is recommended every year. Tetanus, diphtheria, and acellular pertussis (Tdap, Td) vaccine. You may need a Td booster every 10 years. Zoster vaccine. You may need this after age 59. Pneumococcal 13-valent conjugate (PCV13) vaccine. One dose is recommended after age 43. Pneumococcal polysaccharide (PPSV23) vaccine. One dose is recommended after  age  47. Talk to your health care provider about which screenings and vaccines you need and how often you need them. This information is not intended to replace advice given to you by your health care provider. Make sure you discuss any questions you have with your health care provider. Document Released: 11/14/2015 Document Revised: 07/07/2016 Document Reviewed: 08/19/2015 Elsevier Interactive Patient Education  2017 [place] Prevention in the Home Falls can cause injuries. They can happen to people of all ages. There are many things you can do to make your home safe and to help prevent falls. What can I do on the outside of my home? Regularly fix the edges of walkways and driveways and fix any cracks. Remove anything that might make you trip as you walk through a door, such as a raised step or threshold. Trim any bushes or trees on the path to your home. Use bright outdoor lighting. Clear any walking paths of anything that might make someone trip, such as rocks or tools. Regularly check to see if handrails are loose or broken. Make sure that both sides of any steps have handrails. Any raised decks and porches should have guardrails on the edges. Have any leaves, snow, or ice cleared regularly. Use sand or salt on walking paths during winter. Clean up any spills in your garage right away. This includes oil or grease spills. What can I do in the bathroom? Use night lights. Install grab bars by the toilet and in the tub and shower. Do not use towel bars as grab bars. Use non-skid mats or decals in the tub or shower. If you need to sit down in the shower, use a plastic, non-slip stool. Keep the floor dry. Clean up any water that spills on the floor as soon as it happens. Remove soap buildup in the tub or shower regularly. Attach bath mats securely with double-sided non-slip rug tape. Do not have throw rugs and other things on the floor that can make you trip. What can I do in the  bedroom? Use night lights. Make sure that you have a light by your bed that is easy to reach. Do not use any sheets or blankets that are too big for your bed. They should not hang down onto the floor. Have a firm chair that has side arms. You can use this for support while you get dressed. Do not have throw rugs and other things on the floor that can make you trip. What can I do in the kitchen? Clean up any spills right away. Avoid walking on wet floors. Keep items that you use a lot in easy-to-reach places. If you need to reach something above you, use a strong step stool that has a grab bar. Keep electrical cords out of the way. Do not use floor polish or wax that makes floors slippery. If you must use wax, use non-skid floor wax. Do not have throw rugs and other things on the floor that can make you trip. What can I do with my stairs? Do not leave any items on the stairs. Make sure that there are handrails on both sides of the stairs and use them. Fix handrails that are broken or loose. Make sure that handrails are as long as the stairways. Check any carpeting to make sure that it is firmly attached to the stairs. Fix any carpet that is loose or worn. Avoid having throw rugs at the top or bottom of the stairs. If you do  have throw rugs, attach them to the floor with carpet tape. Make sure that you have a light switch at the top of the stairs and the bottom of the stairs. If you do not have them, ask someone to add them for you. What else can I do to help prevent falls? Wear shoes that: Do not have high heels. Have rubber bottoms. Are comfortable and fit you well. Are closed at the toe. Do not wear sandals. If you use a stepladder: Make sure that it is fully opened. Do not climb a closed stepladder. Make sure that both sides of the stepladder are locked into place. Ask someone to hold it for you, if possible. Clearly mark and make sure that you can see: Any grab bars or  handrails. First and last steps. Where the edge of each step is. Use tools that help you move around (mobility aids) if they are needed. These include: Canes. Walkers. Scooters. Crutches. Turn on the lights when you go into a dark area. Replace any light bulbs as soon as they burn out. Set up your furniture so you have a clear path. Avoid moving your furniture around. If any of your floors are uneven, fix them. If there are any pets around you, be aware of where they are. Review your medicines with your doctor. Some medicines can make you feel dizzy. This can increase your chance of falling. Ask your doctor what other things that you can do to help prevent falls. This information is not intended to replace advice given to you by your health care provider. Make sure you discuss any questions you have with your health care provider. Document Released: 08/14/2009 Document Revised: 03/25/2016 Document Reviewed: 11/22/2014 Elsevier Interactive Patient Education  2017 [employer].

## 2022-05-10 NOTE — Progress Notes (Signed)
I connected with Kylie Ward today by telephone and verified that I am speaking with the correct person using two identifiers. Location patient: home Location provider: work Persons participating in the virtual visit: Kim Oki, Ozzie Hoyle (husband), Glenna Durand LPN.   I discussed the limitations, risks, security and privacy concerns of performing an evaluation and management service by telephone and the availability of in person appointments. I also discussed with the patient that there may be a patient responsible charge related to this service. The patient expressed understanding and verbally consented to this telephonic visit.    Interactive audio and video telecommunications were attempted between this provider and patient, however failed, due to patient having technical difficulties OR patient did not have access to video capability.  We continued and completed visit with audio only.     Vital signs may be patient reported or missing.  Subjective:   Kylie Ward is a 76 y.o. female who presents for Medicare Annual (Subsequent) preventive examination.  Review of Systems     Cardiac Risk Factors include: advanced age (>90mn, >>33women);diabetes mellitus;dyslipidemia;hypertension     Objective:    Today's Vitals   05/10/22 1526  Weight: 150 lb (68 kg)  Height: '5\' 2"'  (1.575 m)   Body mass index is 27.44 kg/m.     05/10/2022    3:33 PM 05/07/2021    3:19 PM 01/10/2020   10:33 AM 01/09/2019    2:38 PM 12/29/2016    3:26 PM 12/05/2015   12:40 PM 05/08/2015    4:21 PM  Advanced Directives  Does Patient Have a Medical Advance Directive? No No No No No No No  Would patient like information on creating a medical advance directive?  No - Patient declined No - Patient declined Yes (MAU/Ambulatory/Procedural Areas - Information given) Yes (MAU/Ambulatory/Procedural Areas - Information given) No - patient declined information Yes - Educational materials given    Current  Medications (verified) Outpatient Encounter Medications as of 05/10/2022  Medication Sig   Accu-Chek FastClix Lancets MISC CHECK BLOOD SUGAR UP TO 3 TIMES A DAY AS DIRECTED Dx E11.42   alendronate (FOSAMAX) 70 MG tablet Take 1 tablet (70 mg total) by mouth once a week. Take with a full glass of water on an empty stomach.   ALPRAZolam (XANAX) 1 MG tablet Take 1 tablet (1 mg total) by mouth 2 (two) times daily.   aspirin 81 MG tablet Take 81 mg by mouth daily.   Blood Glucose Monitoring Suppl (ACCU-CHEK NANO SMARTVIEW) w/Device KIT CHECK BLOOD SUGER UP TO 3 TIMES A DAY   donepezil (ARICEPT) 10 MG tablet Take 1 tablet (10 mg total) by mouth at bedtime.   Dulaglutide (TRULICITY) 1.5 MPX/1.0GYSOPN Inject 1.5 mg into the skin once a week.   Elastic Bandages & Supports (V-2 HIGH COMPRESSION HOSE) MISC 1 each by Does not apply route daily.   escitalopram (LEXAPRO) 10 MG tablet Take 1 tablet (10 mg total) by mouth daily.   fenofibrate 160 MG tablet Take 1 tablet (160 mg total) by mouth daily.   furosemide (LASIX) 40 MG tablet Take 1 tablet (40 mg total) by mouth daily.   glucose blood (ACCU-CHEK SMARTVIEW) test strip CHECK BLOOD SUGAR UP TO 3 TIMES A DAY AS DIRECTED   insulin glargine (LANTUS) 100 UNIT/ML injection Inject 0.65 mLs (65 Units total) into the skin at bedtime.   insulin lispro (HUMALOG) 100 UNIT/ML injection Sliding scale up to 12u TID   Insulin Pen Needle (SURE COMFORT PEN NEEDLES)  31G X 8 MM MISC USE TO inject lantus ONCE daily   lisinopril (ZESTRIL) 20 MG tablet Take 1 tablet (20 mg total) by mouth daily.   metFORMIN (GLUCOPHAGE-XR) 750 MG 24 hr tablet Take 1 tablet (750 mg total) by mouth 2 (two) times daily with a meal.   mirabegron ER (MYRBETRIQ) 25 MG TB24 tablet Take 1 tablet (25 mg total) by mouth daily.   simvastatin (ZOCOR) 80 MG tablet Take 1 tablet (80 mg total) by mouth daily.   citalopram (CELEXA) 20 MG tablet Take 1 tablet (20 mg total) by mouth daily. (Patient not taking:  Reported on 05/10/2022)   ondansetron (ZOFRAN) 4 MG tablet Take 1 tablet (4 mg total) by mouth every 8 (eight) hours as needed for nausea or vomiting. (Patient not taking: Reported on 05/10/2022)   No facility-administered encounter medications on file as of 05/10/2022.    Allergies (verified) Lipitor [atorvastatin] and Niaspan [niacin er]   History: Past Medical History:  Diagnosis Date   Anxiety    Diabetes mellitus without complication ([place])    Hyperlipidemia    Hypertension    Stroke [facility])    Past Surgical History:  Procedure Laterality Date   CAROTID ENDARTERECTOMY Right    CAROTID STENT     Family History  Problem Relation Age of Onset   Heart disease Father    Heart attack Father    Hypertension Father    Diabetes Sister    Stroke Sister    Diabetes Brother    Diabetes Sister    Diabetes Sister    Diabetes Brother    Diabetes Brother    Diabetes Brother    Social History   Socioeconomic History   Marital status: Married    Spouse name: Forensic scientist   Number of children: 1   Years of education: 10   Highest education level: 10th grade  Occupational History   Occupation: retired    Fish farm manager: [employer]  Tobacco Use   Smoking status: Former    Packs/day: 1.00    Types: Cigarettes    Quit date: 12/02/2009    Years since quitting: 12.4   Smokeless tobacco: Never  Vaping Use   Vaping Use: Never used  Substance and Sexual Activity   Alcohol use: No   Drug use: No   Sexual activity: Yes    Birth control/protection: Post-menopausal  Other Topics Concern   Not on file  Social History Narrative   One level living with her husband   Social Determinants of Health   Financial Resource Strain: Low Risk  (05/10/2022)   Overall Financial Resource Strain (CARDIA)    Difficulty of Paying Living Expenses: Not hard at all  Food Insecurity: No Food Insecurity (05/10/2022)   Hunger Vital Sign    Worried About Running Out of Food in the Last Year: Never true    Ran Out of  Food in the Last Year: Never true  Transportation Needs: No Transportation Needs (05/10/2022)   PRAPARE - Hydrologist (Medical): No    Lack of Transportation (Non-Medical): No  Physical Activity: Inactive (05/10/2022)   Exercise Vital Sign    Days of Exercise per Week: 0 days    Minutes of Exercise per Session: 0 min  Stress: No Stress Concern Present (05/10/2022)   [place]    Feeling of Stress : Only a little  Social Connections: Socially Isolated (05/07/2021)   Social Connection and Isolation Panel [  NHANES]    Frequency of Communication with Friends and Family: Once a week    Frequency of Social Gatherings with Friends and Family: Once a week    Attends Religious Services: Never    Marine scientist or Organizations: No    Attends Music therapist: Never    Marital Status: Married    Tobacco Counseling Counseling given: Not Answered   Clinical Intake:  Pre-visit preparation completed: Yes  Pain : No/denies pain     Nutritional Status: BMI 25 -29 Overweight Nutritional Risks: None Diabetes: Yes  How often do you need to have someone help you when you read instructions, pamphlets, or other written materials from your doctor or pharmacy?: 1 - Never What is the last grade level you completed in school?: 11th grade  Diabetic? Yes Nutrition Risk Assessment:  Has the patient had any N/V/D within the last 2 months?  No  Does the patient have any non-healing wounds?  No  Has the patient had any unintentional weight loss or weight gain?  No   Diabetes:  Is the patient diabetic?  Yes  If diabetic, was a CBG obtained today?  No  Did the patient bring in their glucometer from home?  No  How often do you monitor your CBG's? daily.   Financial Strains and Diabetes Management:  Are you having any financial strains with the device, your supplies or your medication?  No .  Does the patient want to be seen by Chronic Care Management for management of their diabetes?  No  Would the patient like to be referred to a Nutritionist or for Diabetic Management?  No   Diabetic Exams:  Diabetic Eye Exam: Completed 09/02/2021 Diabetic Foot Exam: Completed 12/08/2021   Interpreter Needed?: No  Information entered by :: NAllen LPN   Activities of Daily Living    05/10/2022    3:35 PM  In your present state of health, do you have any difficulty performing the following activities:  Hearing? 0  Vision? 0  Difficulty concentrating or making decisions? 0  Walking or climbing stairs? 1  Dressing or bathing? 0  Doing errands, shopping? 1  Preparing Food and eating ? N  Using the Toilet? N  In the past six months, have you accidently leaked urine? N  Do you have problems with loss of bowel control? N  Managing your Medications? N  Managing your Finances? N  Housekeeping or managing your Housekeeping? N    Patient Care Team: Chevis Pretty, FNP as PCP - General (Nurse Practitioner) Suella Broad, MD as Consulting Physician (Physical Medicine and Rehabilitation) Lavera Guise, [facility] as Pharmacist (Family Medicine) Ilean China, RN as [place] any recent Medical Services you may have received from other than [facility] providers in the past year (date may be approximate).     Assessment:   This is a routine wellness examination for Ziasia.  Hearing/Vision screen Vision Screening - Comments:: Regular eye exams, in [place]  Dietary issues and exercise activities discussed: Current Exercise Habits: The patient does not participate in regular exercise at present   Goals Addressed             This Visit's Progress    Patient Stated       05/10/2022, start exercising       Depression Screen    05/10/2022    3:34 PM 03/22/2022    2:57 PM 12/08/2021    1:54 PM 09/07/2021  3:37 PM 05/11/2021    2:29 PM  05/07/2021    3:16 PM 01/16/2021    3:03 PM  PHQ 2/9 Scores  PHQ - 2 Score '2 4 2 5 3 2 ' 0  PHQ- 9 Score  '12 9 13 10 4     ' Fall Risk    05/10/2022    3:34 PM 03/22/2022    2:57 PM 12/08/2021    1:54 PM 09/07/2021    3:37 PM 05/11/2021    2:29 PM  [place] in the past year? 0 0 0 0 0  Number falls in past yr: 0      Injury with Fall? 0      Risk for fall due to : Impaired balance/gait;Medication side effect      Follow up Falls evaluation completed;Education provided;Falls prevention discussed        FALL RISK PREVENTION PERTAINING TO THE HOME:  Any stairs in or around the home? No  If so, are there any without handrails? N/a Home free of loose throw rugs in walkways, pet beds, electrical cords, etc? Yes  Adequate lighting in your home to reduce risk of falls? Yes   ASSISTIVE DEVICES UTILIZED TO PREVENT FALLS:  Life alert? No  Use of a cane, walker or w/c? No  Grab bars in the bathroom? Yes  Shower chair or bench in shower? Yes  Elevated toilet seat or a handicapped toilet? Yes   TIMED UP AND GO:  Was the test performed? No .      Cognitive Function:    03/22/2022    3:02 PM 01/09/2019    3:31 PM 12/29/2016    3:21 PM 12/05/2015   12:50 PM  MMSE - Mini Mental State Exam  Orientation to time '1 4 5 5  ' Orientation to Place '1 4 5 5  ' Registration '3 3 3 3  ' Attention/ Calculation '4 5 5 5  ' Recall 0 2 0 1  Language- name 2 objects '2 2 2 2  ' Language- repeat '1 1 1 1  ' Language- follow 3 step command '2 3 3 3  ' Language- read & follow direction '1 1 1 1  ' Write a sentence 0 '1 1 1  ' Copy design 0 '1 1 1  ' Total score '15 27 27 28        ' 05/10/2022    3:37 PM 05/07/2021    3:20 PM 01/10/2020   10:38 AM  6CIT Screen  What Year? 4 points 0 points 0 points  What month? 3 points 0 points 0 points  What time? 0 points 0 points 0 points  Count back from 20 4 points 0 points 0 points  Months in reverse 4 points 4 points 4 points  Repeat phrase 10 points 6 points 8 points  Total  Score 25 points 10 points 12 points    Immunizations Immunization History  Administered Date(s) Administered   Fluad Quad(high Dose 65+) 10/12/2019, 09/07/2021   Influenza, High Dose Seasonal PF 08/22/2017, 10/02/2018   Influenza,inj,Quad PF,6+ Mos 10/19/2013, 09/06/2014, 08/14/2015, 09/02/2016   Moderna Sars-Covid-2 Vaccination 02/26/2020, 03/25/2020   Pneumococcal Conjugate-13 08/14/2015   Pneumococcal Polysaccharide-23 07/14/2012, 01/09/2019   Td 08/30/2011   Tdap 08/30/2011   Zoster Recombinat (Shingrix) 03/22/2022    TDAP status: Due, Education has been provided regarding the importance of this vaccine. Advised may receive this vaccine at local pharmacy or Health Dept. Aware to provide a copy of the vaccination record if obtained from local pharmacy or Health Dept.  Verbalized acceptance and understanding.  Flu Vaccine status: Up to date  Pneumococcal vaccine status: Up to date  Covid-19 vaccine status: Completed vaccines  Qualifies for Shingles Vaccine? Yes   Zostavax completed No   Shingrix Completed?: need second dose  Screening Tests Health Maintenance  Topic Date Due   COVID-19 Vaccine (3 - Moderna series) 05/20/2020   TETANUS/TDAP  09/07/2022 (Originally 08/29/2021)   MAMMOGRAM  03/23/2023 (Originally 04/18/1998)   Zoster Vaccines- Shingrix (2 of 2) 05/17/2022   INFLUENZA VACCINE  06/01/2022   OPHTHALMOLOGY EXAM  09/02/2022   HEMOGLOBIN A1C  09/22/2022   FOOT EXAM  12/08/2022   DEXA SCAN  01/17/2023   Fecal DNA (Cologuard)  04/08/2025   Pneumonia Vaccine 74+ Years old  Completed   Hepatitis C Screening  Completed   HPV VACCINES  Aged Out    Health Maintenance  Health Maintenance Due  Topic Date Due   COVID-19 Vaccine (3 - Moderna series) 05/20/2020    Colorectal cancer screening: Type of screening: Cologuard. Completed 04/08/2022. Repeat every 3 years  Mammogram status: No longer required due to age.  Bone Density status: Completed 01/16/2021.   Lung  Cancer Screening: (Low Dose CT Chest recommended if Age 51-80 years, 30 pack-year currently smoking OR have quit w/in 15years.) does not qualify.   Lung Cancer Screening Referral: no  Additional Screening:  Hepatitis C Screening: does qualify; Completed 12/05/2015  Vision Screening: Recommended annual ophthalmology exams for early detection of glaucoma and other disorders of the eye. Is the patient up to date with their annual eye exam?  Yes  Who is the provider or what is the name of the office in which the patient attends annual eye exams? Can't remember name If pt is not established with a provider, would they like to be referred to a provider to establish care? No .   Dental Screening: Recommended annual dental exams for proper oral hygiene  Community Resource Referral / Chronic Care Management: CRR required this visit?  No   CCM required this visit?  No      Plan:     I have personally reviewed and noted the following in the patient's chart:   Medical and social history Use of alcohol, tobacco or illicit drugs  Current medications and supplements including opioid prescriptions.  Functional ability and status Nutritional status Physical activity Advanced directives List of other physicians Hospitalizations, surgeries, and ER visits in previous 12 months Vitals Screenings to include cognitive, depression, and falls Referrals and appointments  In addition, I have reviewed and discussed with patient certain preventive protocols, quality metrics, and best practice recommendations. A written personalized care plan for preventive services as well as general preventive health recommendations were provided to patient.     Kellie Simmering, LPN   1/44/3154   Nurse Notes: none  Due to this being a virtual visit, the after visit summary with patients personalized plan was offered to patient via mail or my-chart.  Patient preferred to pick up at office at next visit

## 2022-06-01 ENCOUNTER — Ambulatory Visit: Payer: Self-pay | Admitting: *Deleted

## 2022-06-01 NOTE — Chronic Care Management (AMB) (Signed)
  Chronic Care Management   Note  06/01/2022 Name: Kylie Ward MRN: [id number] DOB: [birth year]   Due to changes in the Chronic Care Management program, I am removing myself as the RN Care Manager from the Care Team and closing RN Care Management Care Plans. There were 3 consecutive unsuccessful outreach attempts by [facility].  Patient does not have an open Care Plan with another CCM team member.  I will forward this case closure encounter to the other team member(s). Patient does not have a current CCM referral placed since 03/01/22. CCM enrollment status changed to "not enrolled".   Patient's PCP can place a new referral if the they needs Care Management or Care Coordination services in the future.  Demetrios Loll, BSN, RN-BC Engineer, materials Dial: [phone]

## 2022-06-01 NOTE — Patient Instructions (Signed)
Gardiner Sleeper  At some point during the past 4 years, I was listed on your Care Team because of enrollment in the Chronic Care Management Program at [facility].  My outreach attempts were unsuccessful and due to program changes I am removing myself from your care team.   If you are currently active with another CCM Team Member, you will remain active with them unless they reach out to you with additional information.   If you feel that you need services in the future,  please talk with your primary care provider and request a new referral for Care Management or Care Coordination services. This does not affect your status as a patient at [facility].   Thank you for allowing me to participate in your your healthcare journey.  Demetrios Loll, BSN, RN-BC Engineer, materials Dial: [phone]

## 2022-06-21 ENCOUNTER — Other Ambulatory Visit: Payer: Self-pay | Admitting: Nurse Practitioner

## 2022-06-21 DIAGNOSIS — F411 Generalized anxiety disorder: Secondary | ICD-10-CM

## 2022-06-22 ENCOUNTER — Ambulatory Visit (INDEPENDENT_AMBULATORY_CARE_PROVIDER_SITE_OTHER): Payer: Medicare Other | Admitting: Nurse Practitioner

## 2022-06-22 ENCOUNTER — Encounter: Payer: Self-pay | Admitting: Nurse Practitioner

## 2022-06-22 VITALS — BP 111/61 | HR 68 | Temp 97.2°F | Resp 20 | Ht 62.0 in | Wt 154.0 lb

## 2022-06-22 DIAGNOSIS — I700 Atherosclerosis of aorta: Secondary | ICD-10-CM | POA: Diagnosis not present

## 2022-06-22 DIAGNOSIS — M06061 Rheumatoid arthritis without rheumatoid factor, right knee: Secondary | ICD-10-CM

## 2022-06-22 DIAGNOSIS — M06062 Rheumatoid arthritis without rheumatoid factor, left knee: Secondary | ICD-10-CM

## 2022-06-22 DIAGNOSIS — I10 Essential (primary) hypertension: Secondary | ICD-10-CM | POA: Diagnosis not present

## 2022-06-22 DIAGNOSIS — I6930 Unspecified sequelae of cerebral infarction: Secondary | ICD-10-CM | POA: Diagnosis not present

## 2022-06-22 DIAGNOSIS — I6523 Occlusion and stenosis of bilateral carotid arteries: Secondary | ICD-10-CM

## 2022-06-22 DIAGNOSIS — F01B0 Vascular dementia, moderate, without behavioral disturbance, psychotic disturbance, mood disturbance, and anxiety: Secondary | ICD-10-CM | POA: Diagnosis not present

## 2022-06-22 DIAGNOSIS — F411 Generalized anxiety disorder: Secondary | ICD-10-CM

## 2022-06-22 DIAGNOSIS — Z794 Long term (current) use of insulin: Secondary | ICD-10-CM

## 2022-06-22 DIAGNOSIS — E785 Hyperlipidemia, unspecified: Secondary | ICD-10-CM

## 2022-06-22 DIAGNOSIS — Z23 Encounter for immunization: Secondary | ICD-10-CM

## 2022-06-22 DIAGNOSIS — R609 Edema, unspecified: Secondary | ICD-10-CM | POA: Diagnosis not present

## 2022-06-22 DIAGNOSIS — E1142 Type 2 diabetes mellitus with diabetic polyneuropathy: Secondary | ICD-10-CM

## 2022-06-22 DIAGNOSIS — R3915 Urgency of urination: Secondary | ICD-10-CM

## 2022-06-22 DIAGNOSIS — Z6831 Body mass index (BMI) 31.0-31.9, adult: Secondary | ICD-10-CM

## 2022-06-22 DIAGNOSIS — F339 Major depressive disorder, recurrent, unspecified: Secondary | ICD-10-CM

## 2022-06-22 LAB — BAYER DCA HB A1C WAIVED: HB A1C (BAYER DCA - WAIVED): 6.9 % — ABNORMAL HIGH (ref 4.8–5.6)

## 2022-06-22 MED ORDER — TRAMADOL HCL 50 MG PO TABS: 50.0000 mg | ORAL_TABLET | Freq: Three times a day (TID) | ORAL | 0 refills | Status: DC | PRN

## 2022-06-22 MED ORDER — DONEPEZIL HCL 10 MG PO TABS: 10.0000 mg | ORAL_TABLET | Freq: Every day | ORAL | 1 refills | Status: DC

## 2022-06-22 MED ORDER — MIRABEGRON ER 25 MG PO TB24: 25.0000 mg | ORAL_TABLET | Freq: Every day | ORAL | 5 refills | Status: DC

## 2022-06-22 MED ORDER — TRULICITY 1.5 MG/0.5ML ~~LOC~~ SOAJ: 1.5000 mg | SUBCUTANEOUS | 3 refills | Status: DC

## 2022-06-22 MED ORDER — CITALOPRAM HYDROBROMIDE 20 MG PO TABS: 20.0000 mg | ORAL_TABLET | Freq: Every day | ORAL | 1 refills | Status: DC

## 2022-06-22 MED ORDER — SIMVASTATIN 80 MG PO TABS: 80.0000 mg | ORAL_TABLET | Freq: Every day | ORAL | 1 refills | Status: DC

## 2022-06-22 MED ORDER — MEMANTINE HCL 10 MG PO TABS: 10.0000 mg | ORAL_TABLET | Freq: Two times a day (BID) | ORAL | 1 refills | Status: DC

## 2022-06-22 MED ORDER — METFORMIN HCL ER 750 MG PO TB24: 750.0000 mg | ORAL_TABLET | Freq: Two times a day (BID) | ORAL | 1 refills | Status: DC

## 2022-06-22 MED ORDER — ALPRAZOLAM 1 MG PO TABS: 1.0000 mg | ORAL_TABLET | Freq: Two times a day (BID) | ORAL | 0 refills | Status: DC

## 2022-06-22 MED ORDER — INSULIN GLARGINE 100 UNIT/ML ~~LOC~~ SOLN: 65.0000 [IU] | Freq: Every day | SUBCUTANEOUS | 5 refills | Status: DC

## 2022-06-22 MED ORDER — FUROSEMIDE 40 MG PO TABS: 40.0000 mg | ORAL_TABLET | Freq: Every day | ORAL | 1 refills | Status: DC

## 2022-06-22 MED ORDER — ESCITALOPRAM OXALATE 10 MG PO TABS
10.0000 mg | ORAL_TABLET | Freq: Every day | ORAL | 5 refills | Status: DC
Start: 2022-06-22 — End: 2022-08-26

## 2022-06-22 MED ORDER — LISINOPRIL 20 MG PO TABS: 20.0000 mg | ORAL_TABLET | Freq: Every day | ORAL | 5 refills | Status: DC

## 2022-06-22 NOTE — Addendum Note (Signed)
Addended by: Bennie Pierini on: 06/22/2022 02:54 PM   Modules accepted: Orders

## 2022-06-22 NOTE — Progress Notes (Signed)
Subjective:    Patient ID: Kylie Ward, female    DOB: 1946-04-19, 76 y.o.   MRN: [id number]   Chief Complaint: medical management of chronic issues     HPI:  Kylie Ward is a 76 y.o. who identifies as a female who was assigned female at birth.   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 Essential hypertension, benign No c/o chest pain, sob or headache. Does not check blood pressure at home. BP Readings from Last 3 Encounters:  03/22/22 127/71  12/08/21 (!) 114/58  09/07/21 126/75     2. Hyperlipidemia with target LDL less than 100 Does not really watch diet and does no dedicated exercise. Lab Results  Component Value Date   CHOL 259 (H) 03/22/2022   HDL 41 03/22/2022   LDLCALC 173 (H) 03/22/2022   TRIG 239 (H) 03/22/2022   CHOLHDL 6.3 (H) 03/22/2022     3. Atherosclerosis of aorta ([place]) 4. Symptomatic stenosis of both carotid arteries Has not seen cardiology in several years and does not want to see them.  5. Type 2 diabetes mellitus with diabetic polyneuropathy, with long-term current use of insulin (HCC) Fasting blood sugarshave been running 120-160. No low blood sugars Lab Results  Component Value Date   HGBA1C 7.0 (H) 03/22/2022     6. Late effect of cerebrovascular accident (CVA) No permanent effects  7. Peripheral edema Has daily lower ext edema.   8. GAD (generalized anxiety disorder) Is on xanax BID    03/22/2022    2:57 PM 12/08/2021    1:55 PM 09/07/2021    3:37 PM 05/11/2021    2:29 PM  GAD 7 : Generalized Anxiety Score  Nervous, Anxious, on Edge '1 1 1 1  ' Control/stop worrying '1 1 1 2  ' Worry too much - different things '2 1 1 1  ' Trouble relaxing '1 1 1 1  ' Restless 1 1 0 1  Easily annoyed or irritable 1 1 0 2  Afraid - awful might happen 0 0 0 1  Total GAD 7 Score '7 6 4 9  ' Anxiety Difficulty Somewhat difficult Not difficult at all  Somewhat difficult Not difficult at all     9. BMI 31.0-31.9,adult No recent weight changes Wt Readings from Last 3 Encounters:  06/22/22 154 lb (69.9 kg)  05/10/22 150 lb (68 kg)  03/22/22 150 lb (68 kg)   BMI Readings from Last 3 Encounters:  06/22/22 28.17 kg/m  05/10/22 27.44 kg/m  03/22/22 28.34 kg/m     New complaints: -Patient was seen in May and was dx with dementia- we started her on aricept. Her husband cannot tell a difference. - constant pain in knees and back- Allergies  Allergen Reactions   Lipitor [Atorvastatin]     Joint pain / myalgias    Niaspan [Niacin Er]     Burn and itch all over   Outpatient Encounter Medications as of 06/22/2022  Medication Sig   Accu-Chek FastClix Lancets MISC CHECK BLOOD SUGAR UP TO 3 TIMES A DAY AS DIRECTED Dx E11.42   alendronate (FOSAMAX) 70 MG tablet Take 1 tablet (70 mg total) by mouth once a week. Take with a full glass of water on an empty stomach.   ALPRAZolam (XANAX) 1 MG tablet Take 1 tablet (1 mg total) by mouth 2 (two) times daily.   aspirin 81 MG tablet Take 81 mg by mouth  daily.   Blood Glucose Monitoring Suppl (ACCU-CHEK NANO SMARTVIEW) w/Device KIT CHECK BLOOD SUGER UP TO 3 TIMES A DAY   citalopram (CELEXA) 20 MG tablet Take 1 tablet (20 mg total) by mouth daily. (Patient not taking: Reported on 05/10/2022)   donepezil (ARICEPT) 10 MG tablet Take 1 tablet (10 mg total) by mouth at bedtime.   Dulaglutide (TRULICITY) 1.5 FI/4.3PI SOPN Inject 1.5 mg into the skin once a week.   Elastic Bandages & Supports (V-2 HIGH COMPRESSION HOSE) MISC 1 each by Does not apply route daily.   escitalopram (LEXAPRO) 10 MG tablet Take 1 tablet (10 mg total) by mouth daily.   fenofibrate 160 MG tablet Take 1 tablet (160 mg total) by mouth daily.   furosemide (LASIX) 40 MG tablet Take 1 tablet (40 mg total) by mouth daily.   glucose blood (ACCU-CHEK SMARTVIEW) test strip CHECK BLOOD SUGAR UP TO 3 TIMES A DAY AS DIRECTED   insulin  glargine (LANTUS) 100 UNIT/ML injection Inject 0.65 mLs (65 Units total) into the skin at bedtime.   insulin lispro (HUMALOG) 100 UNIT/ML injection Sliding scale up to 12u TID   Insulin Pen Needle (SURE COMFORT PEN NEEDLES) 31G X 8 MM MISC USE TO inject lantus ONCE daily   lisinopril (ZESTRIL) 20 MG tablet Take 1 tablet (20 mg total) by mouth daily.   metFORMIN (GLUCOPHAGE-XR) 750 MG 24 hr tablet Take 1 tablet (750 mg total) by mouth 2 (two) times daily with a meal.   mirabegron ER (MYRBETRIQ) 25 MG TB24 tablet Take 1 tablet (25 mg total) by mouth daily.   ondansetron (ZOFRAN) 4 MG tablet Take 1 tablet (4 mg total) by mouth every 8 (eight) hours as needed for nausea or vomiting. (Patient not taking: Reported on 05/10/2022)   simvastatin (ZOCOR) 80 MG tablet Take 1 tablet (80 mg total) by mouth daily.   No facility-administered encounter medications on file as of 06/22/2022.    Past Surgical History:  Procedure Laterality Date   CAROTID ENDARTERECTOMY Right    CAROTID STENT      Family History  Problem Relation Age of Onset   Heart disease Father    Heart attack Father    Hypertension Father    Diabetes Sister    Stroke Sister    Diabetes Brother    Diabetes Sister    Diabetes Sister    Diabetes Brother    Diabetes Brother    Diabetes Brother       Controlled substance contract: 06/22/22     Review of Systems  Constitutional:  Negative for diaphoresis.  Eyes:  Negative for pain.  Respiratory:  Negative for shortness of breath.   Cardiovascular:  Negative for chest pain, palpitations and leg swelling.  Gastrointestinal:  Negative for abdominal pain.  Endocrine: Negative for polydipsia.  Skin:  Negative for rash.  Neurological:  Negative for dizziness, weakness and headaches.  Hematological:  Does not bruise/bleed easily.  Psychiatric/Behavioral:  Positive for confusion.   All other systems reviewed and are negative.      Objective:   Physical Exam Vitals and  nursing note reviewed.  Constitutional:      General: She is not in acute distress.    Appearance: Normal appearance. She is well-developed.  HENT:     Head: Normocephalic.     Right Ear: Tympanic membrane normal.     Left Ear: Tympanic membrane normal.     Nose: Nose normal.     Mouth/Throat:     Mouth:  Mucous membranes are moist.  Eyes:     Pupils: Pupils are equal, round, and reactive to light.  Neck:     Vascular: No carotid bruit or JVD.  Cardiovascular:     Rate and Rhythm: Normal rate and regular rhythm.     Heart sounds: Normal heart sounds.  Pulmonary:     Effort: Pulmonary effort is normal. No respiratory distress.     Breath sounds: Normal breath sounds. No wheezing or rales.  Chest:     Chest wall: No tenderness.  Abdominal:     General: Bowel sounds are normal. There is no distension or abdominal bruit.     Palpations: Abdomen is soft. There is no hepatomegaly, splenomegaly, mass or pulsatile mass.     Tenderness: There is no abdominal tenderness.  Musculoskeletal:        General: Normal range of motion.     Cervical back: Normal range of motion and neck supple.  Lymphadenopathy:     Cervical: No cervical adenopathy.  Skin:    General: Skin is warm and dry.  Neurological:     Mental Status: She is alert and oriented to person, place, and time.     Deep Tendon Reflexes: Reflexes are normal and symmetric.  Psychiatric:        Behavior: Behavior normal.        Thought Content: Thought content normal.        Judgment: Judgment normal.    BP 111/61   Pulse 68   Temp (!) 97.2 F (36.2 C) (Temporal)   Resp 20   Ht '5\' 2"'  (1.575 m)   Wt 154 lb (69.9 kg)   LMP 01/27/1983   SpO2 98%   BMI 28.17 kg/m    HGBAc1c 6.9%      Assessment & Plan:  Kylie Ward comes in today with chief complaint of Medical Management of Chronic Issues   Diagnosis and orders addressed:  1. Essential hypertension, benign Low sodium diet - CBC with  Differential/Platelet - CMP14+EGFR - lisinopril (ZESTRIL) 20 MG tablet; Take 1 tablet (20 mg total) by mouth daily.  Dispense: 30 tablet; Refill: 5  2. Hyperlipidemia with target LDL less than 100 Low fat diet - Lipid panel - simvastatin (ZOCOR) 80 MG tablet; Take 1 tablet (80 mg total) by mouth daily.  Dispense: 90 tablet; Refill: 1  3. Atherosclerosis of aorta ([place]) 4. Symptomatic stenosis of both carotid arteries Follow up with cardiology as needed  5. Type 2 diabetes mellitus with diabetic polyneuropathy, with long-term current use of insulin (HCC) Continue to watch carbs in diet - Bayer DCA Hb A1c Waived - Microalbumin / creatinine urine ratio - metFORMIN (GLUCOPHAGE-XR) 750 MG 24 hr tablet; Take 1 tablet (750 mg total) by mouth 2 (two) times daily with a meal.  Dispense: 180 tablet; Refill: 1 - insulin glargine (LANTUS) 100 UNIT/ML injection; Inject 0.65 mLs (65 Units total) into the skin at bedtime.  Dispense: 80 mL; Refill: 5 - Dulaglutide (TRULICITY) 1.5 NL/9.7QB SOPN; Inject 1.5 mg into the skin once a week.  Dispense: 2 mL; Refill: 3  6. Late effect of cerebrovascular accident (CVA)  7. Peripheral edema Elevate  - furosemide (LASIX) 40 MG tablet; Take 1 tablet (40 mg total) by mouth daily.  Dispense: 90 tablet; Refill: 1  8. GAD (generalized anxiety disorder) Stress management - ALPRAZolam (XANAX) 1 MG tablet; Take 1 tablet (1 mg total) by mouth 2 (two) times daily.  Dispense: 60 tablet; Refill: 0  9.  BMI 31.0-31.9,adult Discussed diet and exercise for person with BMI >25 Will recheck weight in 3-6 months  10. Rheumatoid arthritis involving both knees with negative rheumatoid factor (HCC) Will try some ultram - traMADol (ULTRAM) 50 MG tablet; Take 1 tablet (50 mg total) by mouth every 8 (eight) hours as needed for up to 20 days.  Dispense: 60 tablet; Refill: 0  11. Depression, recurrent ([place]) Stress management - citalopram (CELEXA) 20 MG tablet; Take 1 tablet (20  mg total) by mouth daily.  Dispense: 90 tablet; Refill: 1  12. Moderate vascular dementia without behavioral disturbance, psychotic disturbance, mood disturbance, or anxiety (HCC) Added namenda to meds - donepezil (ARICEPT) 10 MG tablet; Take 1 tablet (10 mg total) by mouth at bedtime.  Dispense: 90 tablet; Refill: 1  13. Urinary urgency - mirabegron ER (MYRBETRIQ) 25 MG TB24 tablet; Take 1 tablet (25 mg total) by mouth daily.  Dispense: 30 tablet; Refill: 5   Labs pending Health Maintenance reviewed Diet and exercise encouraged  Follow up plan: 3 months   Mary-Margaret Hassell Done, FNP

## 2022-06-22 NOTE — Patient Instructions (Signed)
Our records indicate that you are due for your annual mammogram/breast imaging. While there is no way to prevent breast cancer, early detection provides the best opportunity for curing it. For women over the age of 40, the American Cancer Society recommends a yearly clinical breast exam and a yearly mammogram. These practices have saved thousands of lives. We need your help to ensure that you are receiving optimal medical care. Please call the imaging location that has done you previous mammograms. Please remember to list us as your primary care. This helps make sure we receive a report and can update your chart.  Below is the contact information for several local breast imaging centers. You may call the location that works best for you, and they will be happy to assistance in making you an appointment. You do not need an order for a regular screening mammogram. However, if you are having any problems or concerns with you breast area, please let your primary care provider know, and appropriate orders will be placed. Please let our office know if you have any questions or concerns. Or if you need information for another imaging center not on this list or outside of the area. We are commented to working with you on your health care journey.   The mobile unit/bus ([facility]) - they come twice a month to our location.  These appointments can be made through our office or by call The Breast Center  [facility]  [address] [place], [place] [address] Phone [phone]  [facility] Radiology Department  [address]  [place], [place] [address] [phone]  [facility]  [address] [place], [place] [address] [phone]  [facility] [place]  [address] [place] [address] [phone]  [facility]  [address], [address] [place] [address] [phone]  [facility] [place]  [address] , [place] [address] [phone]  [facility] [address] [place], [place] [address] [phone]  [facility] [address]  [address] [place], [place] [address] [phone]  [facility] [address] [place] [address] [phone]     

## 2022-06-22 NOTE — Addendum Note (Signed)
Addended by: Cleda Daub on: 06/22/2022 05:16 PM   Modules accepted: Orders

## 2022-06-23 ENCOUNTER — Telehealth: Payer: Self-pay | Admitting: Nurse Practitioner

## 2022-06-23 DIAGNOSIS — E162 Hypoglycemia, unspecified: Secondary | ICD-10-CM | POA: Diagnosis not present

## 2022-06-23 DIAGNOSIS — Z794 Long term (current) use of insulin: Secondary | ICD-10-CM

## 2022-06-23 LAB — CBC WITH DIFFERENTIAL/PLATELET
Basophils Absolute: 0 10*3/uL (ref 0.0–0.2)
Basos: 0 %
EOS (ABSOLUTE): 0.2 10*3/uL (ref 0.0–0.4)
Eos: 2 %
Hematocrit: 38.3 % (ref 34.0–46.6)
Hemoglobin: 12.6 g/dL (ref 11.1–15.9)
Immature Grans (Abs): 0 10*3/uL (ref 0.0–0.1)
Immature Granulocytes: 0 %
Lymphocytes Absolute: 2.1 10*3/uL (ref 0.7–3.1)
Lymphs: 18 %
MCH: 28.9 pg (ref 26.6–33.0)
MCHC: 32.9 g/dL (ref 31.5–35.7)
MCV: 88 fL (ref 79–97)
Monocytes Absolute: 0.8 10*3/uL (ref 0.1–0.9)
Monocytes: 7 %
Neutrophils Absolute: 8.2 10*3/uL — ABNORMAL HIGH (ref 1.4–7.0)
Neutrophils: 73 %
Platelets: 309 10*3/uL (ref 150–450)
RBC: 4.36 x10E6/uL (ref 3.77–5.28)
RDW: 14 % (ref 11.7–15.4)
WBC: 11.3 10*3/uL — ABNORMAL HIGH (ref 3.4–10.8)

## 2022-06-23 LAB — CMP14+EGFR
ALT: 10 IU/L (ref 0–32)
AST: 23 IU/L (ref 0–40)
Albumin/Globulin Ratio: 1.8 (ref 1.2–2.2)
Albumin: 4.1 g/dL (ref 3.8–4.8)
Alkaline Phosphatase: 64 IU/L (ref 44–121)
BUN/Creatinine Ratio: 16 (ref 12–28)
BUN: 22 mg/dL (ref 8–27)
Bilirubin Total: 0.3 mg/dL (ref 0.0–1.2)
CO2: 22 mmol/L (ref 20–29)
Calcium: 9.7 mg/dL (ref 8.7–10.3)
Chloride: 101 mmol/L (ref 96–106)
Creatinine, Ser: 1.4 mg/dL — ABNORMAL HIGH (ref 0.57–1.00)
Globulin, Total: 2.3 g/dL (ref 1.5–4.5)
Glucose: 44 mg/dL — ABNORMAL LOW (ref 70–99)
Potassium: 4.9 mmol/L (ref 3.5–5.2)
Sodium: 143 mmol/L (ref 134–144)
Total Protein: 6.4 g/dL (ref 6.0–8.5)
eGFR: 39 mL/min/{1.73_m2} — ABNORMAL LOW (ref >59)

## 2022-06-23 LAB — LIPID PANEL
Chol/HDL Ratio: 6.4 ratio — ABNORMAL HIGH (ref 0.0–4.4)
Cholesterol, Total: 294 mg/dL — ABNORMAL HIGH (ref 100–199)
HDL: 46 mg/dL (ref >39)
LDL Chol Calc (NIH): 199 mg/dL — ABNORMAL HIGH (ref 0–99)
Triglycerides: 250 mg/dL — ABNORMAL HIGH (ref 0–149)
VLDL Cholesterol Cal: 49 mg/dL — ABNORMAL HIGH (ref 5–40)

## 2022-06-24 MED ORDER — FREESTYLE LIBRE 2 SENSOR MISC: 11 refills | Status: DC

## 2022-06-24 MED ORDER — FREESTYLE LIBRE 2 READER DEVI: 0 refills | Status: DC

## 2022-06-24 NOTE — Telephone Encounter (Signed)
How soon could you fit them in your schedule?

## 2022-06-24 NOTE — Telephone Encounter (Signed)
Called and spoke with daughter and advised that we sent freestyle libre to [facility]. Daughter verbalized understanding

## 2022-06-24 NOTE — Telephone Encounter (Signed)
Let me get with clinical pharmacist and see what we can do>

## 2022-06-24 NOTE — Telephone Encounter (Signed)
Libre 2 CGM should be covered at the local pharmacy for her since she is [employer] and on insulin.  Patient can use the reader device or her smart phone to scan for sugar (just not both, must pick one) I'm happy to fit her in my schedule if additional training is needed.  Will send libre 2 CGM RXs for PCP to [employer] to [facility]

## 2022-06-24 NOTE — Telephone Encounter (Signed)
Please do as soon as you can

## 2022-06-24 NOTE — Telephone Encounter (Signed)
Should we do libre or dexcom for this lady

## 2022-06-25 NOTE — Telephone Encounter (Signed)
They will call back and let us know

## 2022-06-25 NOTE — Telephone Encounter (Signed)
I can see patient today at 1:30pm Let me know!

## 2022-06-28 ENCOUNTER — Ambulatory Visit: Payer: Medicare Other | Admitting: Nurse Practitioner

## 2022-07-08 ENCOUNTER — Ambulatory Visit (INDEPENDENT_AMBULATORY_CARE_PROVIDER_SITE_OTHER): Payer: Medicare Other | Admitting: Nurse Practitioner

## 2022-07-08 ENCOUNTER — Encounter: Payer: Self-pay | Admitting: Nurse Practitioner

## 2022-07-08 VITALS — BP 137/69 | HR 89 | Temp 98.0°F | Resp 20 | Ht 62.0 in | Wt 158.0 lb

## 2022-07-08 DIAGNOSIS — E1142 Type 2 diabetes mellitus with diabetic polyneuropathy: Secondary | ICD-10-CM | POA: Diagnosis not present

## 2022-07-08 DIAGNOSIS — Z794 Long term (current) use of insulin: Secondary | ICD-10-CM

## 2022-07-08 NOTE — Progress Notes (Signed)
Subjective:    Patient ID: Kylie Ward, female    DOB: 02-11-1946, 75 y.o.   MRN: [id number]   Chief Complaint: diabetes  HPI Patient comes in today for diabetes recheck. She has been very noncompliant for years and we were having trouble getting her hgba1c under control. Now she is having trouble with low blood sugars. They requested a libre to monitor her blood sugars more closely. This has really helped her husband monitor her blood sugars. Blood sugar has been 115-150. They  like that it informs them when blood sugar isdropping.   Lab Results  Component Value Date   HGBA1C 6.9 (H) 06/22/2022     Review of Systems  Constitutional:  Negative for diaphoresis.  Eyes:  Negative for pain.  Respiratory:  Negative for shortness of breath.   Cardiovascular:  Negative for chest pain, palpitations and leg swelling.  Gastrointestinal:  Negative for abdominal pain.  Endocrine: Negative for polydipsia.  Skin:  Negative for rash.  Neurological:  Negative for dizziness, weakness and headaches.  Hematological:  Does not bruise/bleed easily.  All other systems reviewed and are negative.      Objective:   Physical Exam Vitals and nursing note reviewed.  Constitutional:      General: She is not in acute distress.    Appearance: Normal appearance. She is well-developed.  Neck:     Vascular: No carotid bruit or JVD.  Cardiovascular:     Rate and Rhythm: Normal rate and regular rhythm.     Heart sounds: Normal heart sounds.  Pulmonary:     Effort: Pulmonary effort is normal. No respiratory distress.     Breath sounds: Normal breath sounds. No wheezing or rales.  Chest:     Chest wall: No tenderness.  Abdominal:     General: Bowel sounds are normal. There is no distension or abdominal bruit.     Palpations: Abdomen is soft. There is no hepatomegaly, splenomegaly, mass or pulsatile mass.     Tenderness: There is no abdominal tenderness.  Musculoskeletal:        General: Normal  range of motion.     Cervical back: Normal range of motion and neck supple.  Lymphadenopathy:     Cervical: No cervical adenopathy.  Skin:    General: Skin is warm and dry.  Neurological:     Mental Status: She is alert and oriented to person, place, and time.     Deep Tendon Reflexes: Reflexes are normal and symmetric.  Psychiatric:        Behavior: Behavior normal.        Thought Content: Thought content normal.        Judgment: Judgment normal.     BP 137/69   Pulse 89   Temp 98 F (36.7 C) (Temporal)   Resp 20   Ht 5\' 2"  (1.575 m)   Wt 158 lb (71.7 kg)   LMP 01/27/1983   SpO2 94%   BMI 28.90 kg/m        Assessment & Plan:   Kylie Ward in today with chief complaint of Discuss blood sugars   1. Type 2 diabetes mellitus with diabetic polyneuropathy, with long-term current use of insulin (HCC) Continue to watch diet Keep check of blood sugars RTO in 2 months    The above assessment and management plan was discussed with the patient. The patient verbalized understanding of and has agreed to the management plan. Patient is aware to call the clinic if symptoms  persist or worsen. Patient is aware when to return to the clinic for a follow-up visit. Patient educated on when it is appropriate to go to the emergency department.   Mary-Margaret Hassell Done, FNP

## 2022-07-14 ENCOUNTER — Other Ambulatory Visit: Payer: Self-pay | Admitting: Nurse Practitioner

## 2022-07-14 DIAGNOSIS — F411 Generalized anxiety disorder: Secondary | ICD-10-CM

## 2022-08-23 ENCOUNTER — Other Ambulatory Visit: Payer: Self-pay | Admitting: Nurse Practitioner

## 2022-08-23 DIAGNOSIS — F411 Generalized anxiety disorder: Secondary | ICD-10-CM

## 2022-08-26 ENCOUNTER — Other Ambulatory Visit: Payer: Self-pay | Admitting: Nurse Practitioner

## 2022-08-26 DIAGNOSIS — F411 Generalized anxiety disorder: Secondary | ICD-10-CM

## 2022-08-26 MED ORDER — ALPRAZOLAM 1 MG PO TABS: 1.0000 mg | ORAL_TABLET | Freq: Two times a day (BID) | ORAL | 0 refills | Status: DC

## 2022-08-26 MED ORDER — ESCITALOPRAM OXALATE 20 MG PO TABS: 20.0000 mg | ORAL_TABLET | Freq: Every day | ORAL | 0 refills | Status: DC

## 2022-09-24 ENCOUNTER — Other Ambulatory Visit: Payer: Self-pay | Admitting: Nurse Practitioner

## 2022-09-27 ENCOUNTER — Ambulatory Visit: Payer: Medicare Other | Admitting: Nurse Practitioner

## 2022-10-01 ENCOUNTER — Encounter: Payer: Self-pay | Admitting: Nurse Practitioner

## 2022-10-01 ENCOUNTER — Ambulatory Visit (INDEPENDENT_AMBULATORY_CARE_PROVIDER_SITE_OTHER): Payer: Medicare Other | Admitting: Nurse Practitioner

## 2022-10-01 VITALS — BP 116/64 | HR 76 | Temp 97.8°F | Resp 20 | Ht 62.0 in | Wt 162.0 lb

## 2022-10-01 DIAGNOSIS — E1142 Type 2 diabetes mellitus with diabetic polyneuropathy: Secondary | ICD-10-CM | POA: Diagnosis not present

## 2022-10-01 DIAGNOSIS — F411 Generalized anxiety disorder: Secondary | ICD-10-CM

## 2022-10-01 DIAGNOSIS — E785 Hyperlipidemia, unspecified: Secondary | ICD-10-CM

## 2022-10-01 DIAGNOSIS — R3915 Urgency of urination: Secondary | ICD-10-CM | POA: Diagnosis not present

## 2022-10-01 DIAGNOSIS — I6523 Occlusion and stenosis of bilateral carotid arteries: Secondary | ICD-10-CM

## 2022-10-01 DIAGNOSIS — I700 Atherosclerosis of aorta: Secondary | ICD-10-CM

## 2022-10-01 DIAGNOSIS — I6930 Unspecified sequelae of cerebral infarction: Secondary | ICD-10-CM | POA: Diagnosis not present

## 2022-10-01 DIAGNOSIS — R600 Localized edema: Secondary | ICD-10-CM

## 2022-10-01 DIAGNOSIS — R609 Edema, unspecified: Secondary | ICD-10-CM

## 2022-10-01 DIAGNOSIS — Z794 Long term (current) use of insulin: Secondary | ICD-10-CM | POA: Diagnosis not present

## 2022-10-01 DIAGNOSIS — I10 Essential (primary) hypertension: Secondary | ICD-10-CM

## 2022-10-01 DIAGNOSIS — F01B0 Vascular dementia, moderate, without behavioral disturbance, psychotic disturbance, mood disturbance, and anxiety: Secondary | ICD-10-CM

## 2022-10-01 DIAGNOSIS — Z23 Encounter for immunization: Secondary | ICD-10-CM

## 2022-10-01 DIAGNOSIS — Z6831 Body mass index (BMI) 31.0-31.9, adult: Secondary | ICD-10-CM

## 2022-10-01 LAB — BAYER DCA HB A1C WAIVED: HB A1C (BAYER DCA - WAIVED): 7.9 % — ABNORMAL HIGH (ref 4.8–5.6)

## 2022-10-01 MED ORDER — MEMANTINE HCL 10 MG PO TABS: 10.0000 mg | ORAL_TABLET | Freq: Two times a day (BID) | ORAL | 1 refills | Status: DC

## 2022-10-01 MED ORDER — LISINOPRIL 20 MG PO TABS: 20.0000 mg | ORAL_TABLET | Freq: Every day | ORAL | 1 refills | Status: DC

## 2022-10-01 MED ORDER — MIRABEGRON ER 25 MG PO TB24: 25.0000 mg | ORAL_TABLET | Freq: Every day | ORAL | 1 refills | Status: DC

## 2022-10-01 MED ORDER — SIMVASTATIN 80 MG PO TABS: 80.0000 mg | ORAL_TABLET | Freq: Every day | ORAL | 1 refills | Status: DC

## 2022-10-01 MED ORDER — ALPRAZOLAM 1 MG PO TABS: 1.0000 mg | ORAL_TABLET | Freq: Two times a day (BID) | ORAL | 5 refills | Status: DC

## 2022-10-01 MED ORDER — METFORMIN HCL ER 750 MG PO TB24: 750.0000 mg | ORAL_TABLET | Freq: Two times a day (BID) | ORAL | 1 refills | Status: DC

## 2022-10-01 MED ORDER — FUROSEMIDE 40 MG PO TABS: 40.0000 mg | ORAL_TABLET | Freq: Every day | ORAL | 1 refills | Status: DC

## 2022-10-01 MED ORDER — FENOFIBRATE 160 MG PO TABS: 160.0000 mg | ORAL_TABLET | Freq: Every day | ORAL | 1 refills | Status: DC

## 2022-10-01 MED ORDER — DONEPEZIL HCL 10 MG PO TABS: 10.0000 mg | ORAL_TABLET | Freq: Every day | ORAL | 1 refills | Status: DC

## 2022-10-01 MED ORDER — ESCITALOPRAM OXALATE 20 MG PO TABS: 20.0000 mg | ORAL_TABLET | Freq: Every day | ORAL | 1 refills | Status: DC

## 2022-10-01 MED ORDER — TRULICITY 1.5 MG/0.5ML ~~LOC~~ SOAJ: 1.5000 mg | SUBCUTANEOUS | 5 refills | Status: DC

## 2022-10-01 MED ORDER — INSULIN GLARGINE 100 UNIT/ML ~~LOC~~ SOLN: 65.0000 [IU] | Freq: Every day | SUBCUTANEOUS | 5 refills | Status: DC

## 2022-10-01 NOTE — Progress Notes (Signed)
Subjective:    Patient ID: Kylie Ward, female    DOB: 20-Mar-1946, 76 y.o.   MRN: [id number]   Chief Complaint: medical management of chronic issues     HPI:  Kylie Ward is a 76 y.o. who identifies as a female who was assigned female at birth.   Social history: Lives with: husband who is her caregiver Work history: retired   Scientist, forensic in today for follow up of the following chronic medical issues:  1. Type 2 diabetes mellitus with diabetic polyneuropathy, with long-term current use of insulin (HCC) Fasting blood sugar are running around 90-200. Mainly in 150's. No low blood sugars. Lab Results  Component Value Date   HGBA1C 6.9 (H) 06/22/2022      2. Essential hypertension, benign No c/o chest pain, sob or headache. Does not check blood pressure at home. BP Readings from Last 3 Encounters:  07/08/22 137/69  06/22/22 111/61  03/22/22 127/71     3. Hyperlipidemia with target LDL less than 100 Eats whatever her husband fixes for her. Lab Results  Component Value Date   CHOL 294 (H) 06/22/2022   HDL 46 06/22/2022   LDLCALC 199 (H) 06/22/2022   TRIG 250 (H) 06/22/2022   CHOLHDL 6.4 (H) 06/22/2022     4. Atherosclerosis of aorta (HCC) No issues with this   5. Symptomatic stenosis of both carotid arteries Last carotid doppler was done years ago. Patient refuses to repeat  6. Late effect of cerebrovascular accident (CVA) No permanent effects.  7. GAD (generalized anxiety disorder) Husband says she stays very anxious all the time. Very difficult to deal with     03/22/2022    2:57 PM 12/08/2021    1:55 PM 09/07/2021    3:37 PM 05/11/2021    2:29 PM  GAD 7 : Generalized Anxiety Score  Nervous, Anxious, on Edge _0 Control/stop worrying _1 Worry too much - different things _2 Trouble relaxing _3 Restless 1 1 0 1  Easily annoyed or irritable 1 1 0 2  Afraid - awful might happen 0 0 0 1  Total GAD 7 Score _4 Anxiety  Difficulty Somewhat difficult Not difficult at all Somewhat difficult Not difficult at all     8. Peripheral edema Has edema daily. Her husband tries to get her to wear compression socks daily  9. BMI 31.0-31.9,adult Weight is up 4lbs Wt Readings from Last 3 Encounters:  10/01/22 162 lb (73.5 kg)  07/08/22 158 lb (71.7 kg)  06/22/22 154 lb (69.9 kg)   BMI Readings from Last 3 Encounters:  10/01/22 29.63 kg/m  07/08/22 28.90 kg/m  06/22/22 28.17 kg/m     New complaints: Husband says her memory is good some days and bad others.  Allergies  Allergen Reactions   Lipitor [Atorvastatin]     Joint pain / myalgias    Niaspan [Niacin Er]     Burn and itch all over   Outpatient Encounter Medications as of 10/01/2022  Medication Sig   Accu-Chek FastClix Lancets MISC CHECK BLOOD SUGAR UP TO 3 TIMES A DAY AS DIRECTED Dx E11.42   alendronate (FOSAMAX) 70 MG tablet Take 1 tablet (70 mg total) by mouth once a week. Take with a full glass of water on an empty stomach.   ALPRAZolam (XANAX) 1 MG tablet Take 1 tablet (1 mg total) by mouth 2 (two) times daily.  aspirin 81 MG tablet Take 81 mg by mouth daily.   Blood Glucose Monitoring Suppl (ACCU-CHEK NANO SMARTVIEW) w/Device KIT CHECK BLOOD SUGER UP TO 3 TIMES A DAY   Continuous Blood Gluc Receiver (FREESTYLE LIBRE 2 READER) DEVI Use to test blood sugar continuously. DX: E11.65   Continuous Blood Gluc Sensor (FREESTYLE LIBRE 2 SENSOR) MISC Use to test blood sugar continuously. Place new sensor on back of the arm every 14 days as directed.  DX: E11.65   donepezil (ARICEPT) 10 MG tablet Take 1 tablet (10 mg total) by mouth at bedtime.   Dulaglutide (TRULICITY) 1.5 SL/3.7DS SOPN Inject 1.5 mg into the skin once a week.   Elastic Bandages & Supports (V-2 HIGH COMPRESSION HOSE) MISC 1 each by Does not apply route daily.   escitalopram (LEXAPRO) 20 MG tablet TAKE ONE TABLET ONCE DAILY   fenofibrate 160 MG tablet Take 1 tablet (160 mg total) by  mouth daily.   furosemide (LASIX) 40 MG tablet Take 1 tablet (40 mg total) by mouth daily.   glucose blood (ACCU-CHEK SMARTVIEW) test strip CHECK BLOOD SUGAR UP TO 3 TIMES A DAY AS DIRECTED   insulin glargine (LANTUS) 100 UNIT/ML injection Inject 0.65 mLs (65 Units total) into the skin at bedtime.   insulin lispro (HUMALOG) 100 UNIT/ML injection Sliding scale up to 12u TID   Insulin Pen Needle (SURE COMFORT PEN NEEDLES) 31G X 8 MM MISC USE TO inject lantus ONCE daily   lisinopril (ZESTRIL) 20 MG tablet Take 1 tablet (20 mg total) by mouth daily.   memantine (NAMENDA) 10 MG tablet Take 1 tablet (10 mg total) by mouth 2 (two) times daily.   metFORMIN (GLUCOPHAGE-XR) 750 MG 24 hr tablet Take 1 tablet (750 mg total) by mouth 2 (two) times daily with a meal.   mirabegron ER (MYRBETRIQ) 25 MG TB24 tablet Take 1 tablet (25 mg total) by mouth daily.   ondansetron (ZOFRAN) 4 MG tablet Take 1 tablet (4 mg total) by mouth every 8 (eight) hours as needed for nausea or vomiting.   simvastatin (ZOCOR) 80 MG tablet Take 1 tablet (80 mg total) by mouth daily.   No facility-administered encounter medications on file as of 10/01/2022.    Past Surgical History:  Procedure Laterality Date   CAROTID ENDARTERECTOMY Right    CAROTID STENT      Family History  Problem Relation Age of Onset   Heart disease Father    Heart attack Father    Hypertension Father    Diabetes Sister    Stroke Sister    Diabetes Brother    Diabetes Sister    Diabetes Sister    Diabetes Brother    Diabetes Brother    Diabetes Brother       Controlled substance contract: n/a     Review of Systems  Constitutional:  Negative for diaphoresis.  Eyes:  Negative for pain.  Respiratory:  Negative for shortness of breath.   Cardiovascular:  Negative for chest pain, palpitations and leg swelling.  Gastrointestinal:  Negative for abdominal pain.  Endocrine: Negative for polydipsia.  Skin:  Negative for rash.  Neurological:   Negative for dizziness, weakness and headaches.  Hematological:  Does not bruise/bleed easily.  All other systems reviewed and are negative.      Objective:   Physical Exam Vitals and nursing note reviewed.  Constitutional:      General: She is not in acute distress.    Appearance: Normal appearance. She is well-developed.  HENT:  Head: Normocephalic.     Right Ear: Tympanic membrane normal.     Left Ear: Tympanic membrane normal.     Nose: Nose normal.     Mouth/Throat:     Mouth: Mucous membranes are moist.  Eyes:     Pupils: Pupils are equal, round, and reactive to light.  Neck:     Vascular: No carotid bruit or JVD.  Cardiovascular:     Rate and Rhythm: Normal rate and regular rhythm.     Heart sounds: Normal heart sounds.  Pulmonary:     Effort: Pulmonary effort is normal. No respiratory distress.     Breath sounds: Normal breath sounds. No wheezing or rales.  Chest:     Chest wall: No tenderness.  Abdominal:     General: Bowel sounds are normal. There is no distension or abdominal bruit.     Palpations: Abdomen is soft. There is no hepatomegaly, splenomegaly, mass or pulsatile mass.     Tenderness: There is no abdominal tenderness.  Musculoskeletal:        General: Normal range of motion.     Cervical back: Normal range of motion and neck supple.     Right lower leg: Edema (1+) present.     Left lower leg: Edema (1+) present.  Lymphadenopathy:     Cervical: No cervical adenopathy.  Skin:    General: Skin is warm and dry.  Neurological:     Mental Status: She is alert and oriented to person, place, and time.     Deep Tendon Reflexes: Reflexes are normal and symmetric.  Psychiatric:        Behavior: Behavior normal.        Thought Content: Thought content normal.        Judgment: Judgment normal.    BP 116/64   Pulse 76   Temp 97.8 F (36.6 C) (Temporal)   Resp 20   Ht _0  (1.575 m)   Wt 162 lb (73.5 kg)   LMP 01/27/1983   SpO2 92%   BMI 29.63  kg/m   HGBA1c 7.9%      Assessment & Plan:   PEYTIN DECHERT comes in today with chief complaint of Medical Management of Chronic Issues   Diagnosis and orders addressed:  1. Type 2 diabetes mellitus with diabetic polyneuropathy, with long-term current use of insulin (HCC) Stricter carb counting - Bayer DCA Hb A1c Waived - Microalbumin / creatinine urine ratio - Dulaglutide (TRULICITY) 1.5 JG/8.1LX SOPN; Inject 1.5 mg into the skin once a week.  Dispense: 2 mL; Refill: 5 - insulin glargine (LANTUS) 100 UNIT/ML injection; Inject 0.65 mLs (65 Units total) into the skin at bedtime.  Dispense: 80 mL; Refill: 5 - metFORMIN (GLUCOPHAGE-XR) 750 MG 24 hr tablet; Take 1 tablet (750 mg total) by mouth 2 (two) times daily with a meal.  Dispense: 180 tablet; Refill: 1  2. Essential hypertension, benign Low sodium diet - CBC with Differential/Platelet - CMP14+EGFR - lisinopril (ZESTRIL) 20 MG tablet; Take 1 tablet (20 mg total) by mouth daily.  Dispense: 90 tablet; Refill: 1  3. Hyperlipidemia with target LDL less than 100 Low fat diet - Lipid panel - simvastatin (ZOCOR) 80 MG tablet; Take 1 tablet (80 mg total) by mouth daily.  Dispense: 90 tablet; Refill: 1 - fenofibrate 160 MG tablet; Take 1 tablet (160 mg total) by mouth daily.  Dispense: 90 tablet; Refill: 1  4. Atherosclerosis of aorta ([place])  5. Symptomatic stenosis of both carotid arteries Will  order doppler study - US Carotid Duplex Bilateral; Future  6. Late effect of cerebrovascular accident (CVA)  7. GAD (generalized anxiety disorder) Stress management - ALPRAZolam (XANAX) 1 MG tablet; Take 1 tablet (1 mg total) by mouth 2 (two) times daily.  Dispense: 60 tablet; Refill: 5  8. Peripheral edema Elevate legs when sitting - furosemide (LASIX) 40 MG tablet; Take 1 tablet (40 mg total) by mouth daily.  Dispense: 90 tablet; Refill: 1  9. BMI 31.0-31.9,adult Discussed diet and exercise for person with BMI >25 Will recheck  weight in 3-6 months   10. Moderate vascular dementia without behavioral disturbance, psychotic disturbance, mood disturbance, or anxiety (HCC) [place] daily - donepezil (ARICEPT) 10 MG tablet; Take 1 tablet (10 mg total) by mouth at bedtime.  Dispense: 90 tablet; Refill: 1  11. Urinary urgency - mirabegron ER (MYRBETRIQ) 25 MG TB24 tablet; Take 1 tablet (25 mg total) by mouth daily.  Dispense: 90 tablet; Refill: 1   Labs pending Health Maintenance reviewed Diet and exercise encouraged  Follow up plan: 3 months   Mary-Margaret Hassell Done, FNP

## 2022-10-01 NOTE — Patient Instructions (Signed)
Peripheral Edema  Peripheral edema is swelling that is caused by a buildup of fluid. Peripheral edema most often affects the lower legs, ankles, and feet. It can also develop in the arms, hands, and face. The area of the body that has peripheral edema will look swollen. It may also feel heavy or warm. Your clothes may start to feel tight. Pressing on the area may make a temporary dent in your skin (pitting edema). You may not be able to move your swollen arm or leg as much as usual. There are many causes of peripheral edema. It can happen because of a complication of other conditions such as heart failure, kidney disease, or a problem with your circulation. It also can be a side effect of certain medicines or happen because of an infection. It often happens to women during pregnancy. Sometimes, the cause is not known. Follow these instructions at home: Managing pain, stiffness, and swelling  Raise (elevate) your legs while you are sitting or lying down. Move around often to prevent stiffness and to reduce swelling. Do not sit or stand for long periods of time. Do not wear tight clothing. Do not wear garters on your upper legs. Exercise your legs to get your circulation going. This helps to move the fluid back into your blood vessels, and it may help the swelling go down. Wear compression stockings as told by your health care provider. These stockings help to prevent blood clots and reduce swelling in your legs. It is important that these are the correct size. These stockings should be prescribed by your doctor to prevent possible injuries. If elastic bandages or wraps are recommended, use them as told by your health care provider. Medicines Take over-the-counter and prescription medicines only as told by your health care provider. Your health care provider may prescribe medicine to help your body get rid of excess water (diuretic). Take this medicine if you are told to take it. General  instructions Eat a low-salt (low-sodium) diet as told by your health care provider. Sometimes, eating less salt may reduce swelling. Pay attention to any changes in your symptoms. Moisturize your skin daily to help prevent skin from cracking and draining. Keep all follow-up visits. This is important. Contact a health care provider if: You have a fever. You have swelling in only one leg. You have increased swelling, redness, or pain in one or both of your legs. You have drainage or sores at the area where you have edema. Get help right away if: You have edema that starts suddenly or is getting worse, especially if you are pregnant or have a medical condition. You develop shortness of breath, especially when you are lying down. You have pain in your chest or abdomen. You feel weak. You feel like you will faint. These symptoms may be an emergency. Get help right away. Call 911. Do not wait to see if the symptoms will go away. Do not drive yourself to the hospital. Summary Peripheral edema is swelling that is caused by a buildup of fluid. Peripheral edema most often affects the lower legs, ankles, and feet. Move around often to prevent stiffness and to reduce swelling. Do not sit or stand for long periods of time. Pay attention to any changes in your symptoms. Contact a health care provider if you have edema that starts suddenly or is getting worse, especially if you are pregnant or have a medical condition. Get help right away if you develop shortness of breath, especially when lying down.   This information is not intended to replace advice given to you by your health care provider. Make sure you discuss any questions you have with your health care provider. Document Revised: 06/22/2021 Document Reviewed: 06/22/2021 Elsevier Patient Education  2023 Elsevier Inc.  

## 2022-10-02 LAB — CMP14+EGFR
ALT: 15 IU/L (ref 0–32)
AST: 25 IU/L (ref 0–40)
Albumin/Globulin Ratio: 1.8 (ref 1.2–2.2)
Albumin: 4 g/dL (ref 3.8–4.8)
Alkaline Phosphatase: 80 IU/L (ref 44–121)
BUN/Creatinine Ratio: 15 (ref 12–28)
BUN: 15 mg/dL (ref 8–27)
Bilirubin Total: <0.2 mg/dL (ref 0.0–1.2)
CO2: 21 mmol/L (ref 20–29)
Calcium: 9.1 mg/dL (ref 8.7–10.3)
Chloride: 102 mmol/L (ref 96–106)
Creatinine, Ser: 1.01 mg/dL — ABNORMAL HIGH (ref 0.57–1.00)
Globulin, Total: 2.2 g/dL (ref 1.5–4.5)
Glucose: 221 mg/dL — ABNORMAL HIGH (ref 70–99)
Potassium: 4.5 mmol/L (ref 3.5–5.2)
Sodium: 142 mmol/L (ref 134–144)
Total Protein: 6.2 g/dL (ref 6.0–8.5)
eGFR: 58 mL/min/{1.73_m2} — ABNORMAL LOW (ref >59)

## 2022-10-02 LAB — CBC WITH DIFFERENTIAL/PLATELET
Basophils Absolute: 0 10*3/uL (ref 0.0–0.2)
Basos: 0 %
EOS (ABSOLUTE): 0.2 10*3/uL (ref 0.0–0.4)
Eos: 2 %
Hematocrit: 37.8 % (ref 34.0–46.6)
Hemoglobin: 12.7 g/dL (ref 11.1–15.9)
Immature Grans (Abs): 0 10*3/uL (ref 0.0–0.1)
Immature Granulocytes: 0 %
Lymphocytes Absolute: 1.6 10*3/uL (ref 0.7–3.1)
Lymphs: 13 %
MCH: 30.3 pg (ref 26.6–33.0)
MCHC: 33.6 g/dL (ref 31.5–35.7)
MCV: 90 fL (ref 79–97)
Monocytes Absolute: 0.7 10*3/uL (ref 0.1–0.9)
Monocytes: 6 %
Neutrophils Absolute: 9.4 10*3/uL — ABNORMAL HIGH (ref 1.4–7.0)
Neutrophils: 79 %
Platelets: 241 10*3/uL (ref 150–450)
RBC: 4.19 x10E6/uL (ref 3.77–5.28)
RDW: 14.1 % (ref 11.7–15.4)
WBC: 11.9 10*3/uL — ABNORMAL HIGH (ref 3.4–10.8)

## 2022-10-02 LAB — LIPID PANEL
Chol/HDL Ratio: 9.8 ratio — ABNORMAL HIGH (ref 0.0–4.4)
Cholesterol, Total: 303 mg/dL — ABNORMAL HIGH (ref 100–199)
HDL: 31 mg/dL — ABNORMAL LOW (ref >39)
Triglycerides: 801 mg/dL (ref 0–149)

## 2022-10-11 NOTE — Progress Notes (Signed)
[facility] Quality Team Note  Name: Kylie Ward Date of Birth: 03/03/1946 MRN: [id number] Date: 10/11/2022  [facility] Quality Team has reviewed this patient's chart, please see recommendations below:  [facility] Quality Other; (KED: Kidney Health Evaluation Gap- Patient needs Urine Albumin Creatinine Ratio Test completed for gap closure. EGFR has already been completed).

## 2022-10-21 ENCOUNTER — Ambulatory Visit (HOSPITAL_COMMUNITY): Admission: RE | Admit: 2022-10-21 | Payer: Medicare Other | Source: Ambulatory Visit

## 2022-11-03 ENCOUNTER — Ambulatory Visit (HOSPITAL_COMMUNITY)
Admission: RE | Admit: 2022-11-03 | Discharge: 2022-11-03 | Disposition: A | Discharge status: Home / Self Care | Payer: Medicare Other | Source: Ambulatory Visit | Attending: Nurse Practitioner | Admitting: Nurse Practitioner

## 2022-11-03 DIAGNOSIS — I6521 Occlusion and stenosis of right carotid artery: Secondary | ICD-10-CM | POA: Diagnosis not present

## 2022-11-03 DIAGNOSIS — I6523 Occlusion and stenosis of bilateral carotid arteries: Secondary | ICD-10-CM | POA: Diagnosis not present

## 2022-12-09 ENCOUNTER — Encounter: Payer: Self-pay | Admitting: Nurse Practitioner

## 2022-12-31 ENCOUNTER — Ambulatory Visit: Payer: Medicare Other | Admitting: Nurse Practitioner

## 2023-01-04 ENCOUNTER — Ambulatory Visit (INDEPENDENT_AMBULATORY_CARE_PROVIDER_SITE_OTHER): Payer: Medicare Other | Admitting: Nurse Practitioner

## 2023-01-04 ENCOUNTER — Encounter: Payer: Self-pay | Admitting: Nurse Practitioner

## 2023-01-04 VITALS — BP 128/66 | HR 73 | Temp 97.6°F | Resp 20 | Ht 62.0 in | Wt 163.0 lb

## 2023-01-04 DIAGNOSIS — I6930 Unspecified sequelae of cerebral infarction: Secondary | ICD-10-CM | POA: Diagnosis not present

## 2023-01-04 DIAGNOSIS — E1142 Type 2 diabetes mellitus with diabetic polyneuropathy: Secondary | ICD-10-CM | POA: Diagnosis not present

## 2023-01-04 DIAGNOSIS — I700 Atherosclerosis of aorta: Secondary | ICD-10-CM | POA: Diagnosis not present

## 2023-01-04 DIAGNOSIS — R609 Edema, unspecified: Secondary | ICD-10-CM | POA: Diagnosis not present

## 2023-01-04 DIAGNOSIS — I6523 Occlusion and stenosis of bilateral carotid arteries: Secondary | ICD-10-CM | POA: Diagnosis not present

## 2023-01-04 DIAGNOSIS — Z794 Long term (current) use of insulin: Secondary | ICD-10-CM

## 2023-01-04 DIAGNOSIS — I10 Essential (primary) hypertension: Secondary | ICD-10-CM

## 2023-01-04 DIAGNOSIS — E785 Hyperlipidemia, unspecified: Secondary | ICD-10-CM | POA: Diagnosis not present

## 2023-01-04 DIAGNOSIS — Z6831 Body mass index (BMI) 31.0-31.9, adult: Secondary | ICD-10-CM

## 2023-01-04 DIAGNOSIS — F411 Generalized anxiety disorder: Secondary | ICD-10-CM

## 2023-01-04 LAB — BAYER DCA HB A1C WAIVED: HB A1C (BAYER DCA - WAIVED): 8.7 % — ABNORMAL HIGH (ref 4.8–5.6)

## 2023-01-04 MED ORDER — TRULICITY 4.5 MG/0.5ML ~~LOC~~ SOAJ: 4.5000 mg | SUBCUTANEOUS | 5 refills | Status: DC

## 2023-01-04 NOTE — Progress Notes (Signed)
Subjective:    Patient ID: Kylie Ward, female    DOB: 16-Sep-1946, 77 y.o.   MRN: [id number]   Chief Complaint: medical management of chronic issues     HPI:  Kylie Ward is a 77 y.o. who identifies as a female who was assigned female at birth.   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 Type 2 diabetes mellitus with diabetic polyneuropathy, with long-term current use of insulin (HCC) Fasting blood sugars are running around 120-140 and sometimes over 220-300. They do not watch what they eat. Lab Results  Component Value Date   HGBA1C 7.9 (H) 10/01/2022     2. Essential hypertension, benign No chest pain sob or headache. Does not check blood pressure at home. BP Readings from Last 3 Encounters:  01/04/23 128/66  10/01/22 116/64  07/08/22 137/69     3. Hyperlipidemia with target LDL less than 100 Does not watch diet and does no exercise at all. Lab Results  Component Value Date   CHOL 303 (H) 10/01/2022   HDL 31 (L) 10/01/2022   LDLCALC Comment (A) 10/01/2022   TRIG 801 (HH) 10/01/2022   CHOLHDL 9.8 (H) 10/01/2022     4. Atherosclerosis of aorta ([place]) Does not see cardiology  5. Symptomatic stenosis of both carotid arteries No c/o dizziness or syncopal episodes  6. Late effect of cerebrovascular accident (CVA) No permanent effects.  7. Peripheral edema Has several times a week.  8. Dementia Is on aricept and namenda-is doing okay , according to her husband.  9. GAD (generalized anxiety disorder) She is on xanax BID    01/04/2023    2:42 PM 03/22/2022    2:57 PM 12/08/2021    1:55 PM 09/07/2021    3:37 PM  GAD 7 : Generalized Anxiety Score  Nervous, Anxious, on Edge '2 1 1 1  '$ Control/stop worrying '2 1 1 1  '$ Worry too much - different things '2 2 1 1  '$ Trouble relaxing '2 1 1 1  '$ Restless '1 1 1 '$ 0  Easily annoyed or irritable '3 1 1 '$ 0  Afraid - awful  might happen 1 0 0 0  Total GAD 7 Score '13 7 6 4  '$ Anxiety Difficulty Somewhat difficult Somewhat difficult Not difficult at all Somewhat difficult       01/04/2023    2:41 PM 05/10/2022    3:34 PM 03/22/2022    2:57 PM  Depression screen PHQ 2/9  Decreased Interest '3 1 2  '$ Down, Depressed, Hopeless '3 1 2  '$ PHQ - 2 Score '6 2 4  '$ Altered sleeping 2  2  Tired, decreased energy 3  2  Change in appetite 0  1  Feeling bad or failure about yourself  1  1  Trouble concentrating 3  1  Moving slowly or fidgety/restless 3  1  Suicidal thoughts 2  0  PHQ-9 Score 20  12  Difficult doing work/chores Not difficult at all  Not difficult at all     10. BMI 31.0-31.9,adult No recent weight changes Wt Readings from Last 3 Encounters:  01/04/23 163 lb (73.9 kg)  10/01/22 162 lb (73.5 kg)  07/08/22 158 lb (71.7 kg)   BMI Readings from Last 3 Encounters:  01/04/23 29.81 kg/m  10/01/22 29.63 kg/m  07/08/22 28.90 kg/m      New complaints: Trouble sleeping at night. Has trouble falling asleep  Allergies  Allergen Reactions   Lipitor [Atorvastatin]     Joint pain / myalgias    Niaspan [Niacin Er]     Burn and itch all over   Outpatient Encounter Medications as of 01/04/2023  Medication Sig   Accu-Chek FastClix Lancets MISC CHECK BLOOD SUGAR UP TO 3 TIMES A DAY AS DIRECTED Dx E11.42   alendronate (FOSAMAX) 70 MG tablet Take 1 tablet (70 mg total) by mouth once a week. Take with a full glass of water on an empty stomach.   ALPRAZolam (XANAX) 1 MG tablet Take 1 tablet (1 mg total) by mouth 2 (two) times daily.   aspirin 81 MG tablet Take 81 mg by mouth daily.   Blood Glucose Monitoring Suppl (ACCU-CHEK NANO SMARTVIEW) w/Device KIT CHECK BLOOD SUGER UP TO 3 TIMES A DAY   Continuous Blood Gluc Receiver (FREESTYLE LIBRE 2 READER) DEVI Use to test blood sugar continuously. DX: E11.65   Continuous Blood Gluc Sensor (FREESTYLE LIBRE 2 SENSOR) MISC Use to test blood sugar continuously. Place new  sensor on back of the arm every 14 days as directed.  DX: E11.65   donepezil (ARICEPT) 10 MG tablet Take 1 tablet (10 mg total) by mouth at bedtime.   Dulaglutide (TRULICITY) 1.5 0000000 SOPN Inject 1.5 mg into the skin once a week.   Elastic Bandages & Supports (V-2 HIGH COMPRESSION HOSE) MISC 1 each by Does not apply route daily.   escitalopram (LEXAPRO) 20 MG tablet Take 1 tablet (20 mg total) by mouth daily.   fenofibrate 160 MG tablet Take 1 tablet (160 mg total) by mouth daily.   furosemide (LASIX) 40 MG tablet Take 1 tablet (40 mg total) by mouth daily.   glucose blood (ACCU-CHEK SMARTVIEW) test strip CHECK BLOOD SUGAR UP TO 3 TIMES A DAY AS DIRECTED   insulin glargine (LANTUS) 100 UNIT/ML injection Inject 0.65 mLs (65 Units total) into the skin at bedtime.   insulin lispro (HUMALOG) 100 UNIT/ML injection Sliding scale up to 12u TID   Insulin Pen Needle (SURE COMFORT PEN NEEDLES) 31G X 8 MM MISC USE TO inject lantus ONCE daily   lisinopril (ZESTRIL) 20 MG tablet Take 1 tablet (20 mg total) by mouth daily.   memantine (NAMENDA) 10 MG tablet Take 1 tablet (10 mg total) by mouth 2 (two) times daily.   metFORMIN (GLUCOPHAGE-XR) 750 MG 24 hr tablet Take 1 tablet (750 mg total) by mouth 2 (two) times daily with a meal.   mirabegron ER (MYRBETRIQ) 25 MG TB24 tablet Take 1 tablet (25 mg total) by mouth daily.   ondansetron (ZOFRAN) 4 MG tablet Take 1 tablet (4 mg total) by mouth every 8 (eight) hours as needed for nausea or vomiting.   simvastatin (ZOCOR) 80 MG tablet Take 1 tablet (80 mg total) by mouth daily.   No facility-administered encounter medications on file as of 01/04/2023.    Past Surgical History:  Procedure Laterality Date   CAROTID ENDARTERECTOMY Right    CAROTID STENT      Family History  Problem Relation Age of Onset   Heart disease Father    Heart attack Father    Hypertension Father    Diabetes Sister    Stroke Sister    Diabetes Brother    Diabetes Sister     Diabetes Sister    Diabetes Brother    Diabetes Brother    Diabetes Brother       Controlled substance contract: 06/23/22     Review of Systems  Constitutional:  Negative for diaphoresis.  Eyes:  Negative for pain.  Respiratory:  Negative for shortness of breath.   Cardiovascular:  Negative for chest pain, palpitations and leg swelling.  Gastrointestinal:  Negative for abdominal pain.  Endocrine: Negative for polydipsia.  Skin:  Negative for rash.  Neurological:  Negative for dizziness, weakness and headaches.  Hematological:  Does not bruise/bleed easily.  All other systems reviewed and are negative.      Objective:   Physical Exam Vitals and nursing note reviewed.  Constitutional:      General: She is not in acute distress.    Appearance: Normal appearance. She is well-developed.  HENT:     Head: Normocephalic.     Right Ear: Tympanic membrane normal.     Left Ear: Tympanic membrane normal.     Nose: Nose normal.     Mouth/Throat:     Mouth: Mucous membranes are moist.  Eyes:     Pupils: Pupils are equal, round, and reactive to light.  Neck:     Vascular: No carotid bruit or JVD.  Cardiovascular:     Rate and Rhythm: Normal rate and regular rhythm.     Heart sounds: Normal heart sounds.  Pulmonary:     Effort: Pulmonary effort is normal. No respiratory distress.     Breath sounds: Normal breath sounds. No wheezing or rales.  Chest:     Chest wall: No tenderness.  Abdominal:     General: Bowel sounds are normal. There is no distension or abdominal bruit.     Palpations: Abdomen is soft. There is no hepatomegaly, splenomegaly, mass or pulsatile mass.     Tenderness: There is no abdominal tenderness.  Musculoskeletal:        General: Normal range of motion.     Cervical back: Normal range of motion and neck supple.     Right lower leg: Edema (2+) present.     Left lower leg: Edema (2+) present.  Lymphadenopathy:     Cervical: No cervical adenopathy.   Skin:    General: Skin is warm and dry.  Neurological:     Mental Status: She is alert and oriented to person, place, and time.     Cranial Nerves: No cranial nerve deficit.     Sensory: No sensory deficit.     Deep Tendon Reflexes: Reflexes are normal and symmetric.     Comments: Head tremor  Psychiatric:        Behavior: Behavior normal.        Thought Content: Thought content normal.        Judgment: Judgment normal.     BP 128/66   Pulse 73   Temp 97.6 F (36.4 C) (Temporal)   Resp 20   Ht '5\' 2"'$  (1.575 m)   Wt 163 lb (73.9 kg)   LMP 01/27/1983   SpO2 97%   BMI 29.81 kg/m   Hgba1c 8.7%     Assessment & Plan:  Kylie Ward comes in today with chief complaint of No chief complaint on file.   Diagnosis and orders addressed:  1. Type 2 diabetes mellitus with diabetic polyneuropathy, with long-term current use of insulin (HCC) Increase trulicity to 4.5 mg weekly Stricter carb counting - Bayer DCA Hb A1c Waived - Microalbumin / creatinine urine ratio  2. Essential hypertension, benign Low sodium diet - CBC with Differential/Platelet - CMP14+EGFR  3. Hyperlipidemia with target LDL less than 100 Low fat diet - Lipid panel  4. Atherosclerosis of  aorta ([place])  5. Symptomatic stenosis of both carotid arteries Report any syncopal or near syncopal episodes  6. Late effect of cerebrovascular accident (CVA)  7. Peripheral edema Elevate legs when sitting  8. GAD (generalized anxiety disorder) Stress management  9. BMI 31.0-31.9,adult Discussed diet and exercise for person with BMI >25 Will recheck weight in 3-6 months    Labs pending Health Maintenance reviewed Diet and exercise encouraged  Follow up plan: 3 months   Mary-Margaret Hassell Done, FNP

## 2023-01-04 NOTE — Patient Instructions (Signed)

## 2023-01-05 LAB — CMP14+EGFR
ALT: 16 IU/L (ref 0–32)
AST: 26 IU/L (ref 0–40)
Albumin/Globulin Ratio: 1.8 (ref 1.2–2.2)
Albumin: 4.2 g/dL (ref 3.8–4.8)
Alkaline Phosphatase: 80 IU/L (ref 44–121)
BUN/Creatinine Ratio: 13 (ref 12–28)
BUN: 19 mg/dL (ref 8–27)
Bilirubin Total: <0.2 mg/dL (ref 0.0–1.2)
CO2: 15 mmol/L — ABNORMAL LOW (ref 20–29)
Calcium: 9.4 mg/dL (ref 8.7–10.3)
Chloride: 101 mmol/L (ref 96–106)
Creatinine, Ser: 1.51 mg/dL — ABNORMAL HIGH (ref 0.57–1.00)
Globulin, Total: 2.3 g/dL (ref 1.5–4.5)
Glucose: 87 mg/dL (ref 70–99)
Potassium: 4.6 mmol/L (ref 3.5–5.2)
Sodium: 138 mmol/L (ref 134–144)
Total Protein: 6.5 g/dL (ref 6.0–8.5)
eGFR: 36 mL/min/{1.73_m2} — ABNORMAL LOW (ref >59)

## 2023-01-05 LAB — CBC WITH DIFFERENTIAL/PLATELET
Basophils Absolute: 0.1 10*3/uL (ref 0.0–0.2)
Basos: 1 %
EOS (ABSOLUTE): 0.2 10*3/uL (ref 0.0–0.4)
Eos: 2 %
Hematocrit: 35 % (ref 34.0–46.6)
Hemoglobin: 11.4 g/dL (ref 11.1–15.9)
Immature Grans (Abs): 0 10*3/uL (ref 0.0–0.1)
Immature Granulocytes: 0 %
Lymphocytes Absolute: 2.6 10*3/uL (ref 0.7–3.1)
Lymphs: 25 %
MCH: 29.8 pg (ref 26.6–33.0)
MCHC: 32.6 g/dL (ref 31.5–35.7)
MCV: 92 fL (ref 79–97)
Monocytes Absolute: 0.8 10*3/uL (ref 0.1–0.9)
Monocytes: 7 %
Neutrophils Absolute: 6.7 10*3/uL (ref 1.4–7.0)
Neutrophils: 65 %
Platelets: 274 10*3/uL (ref 150–450)
RBC: 3.82 x10E6/uL (ref 3.77–5.28)
RDW: 12.5 % (ref 11.7–15.4)
WBC: 10.3 10*3/uL (ref 3.4–10.8)

## 2023-01-05 LAB — MICROALBUMIN / CREATININE URINE RATIO
Creatinine, Urine: 62.9 mg/dL
Microalb/Creat Ratio: 26 mg/g creat (ref 0–29)
Microalbumin, Urine: 16.1 ug/mL

## 2023-01-05 LAB — LIPID PANEL
Chol/HDL Ratio: 7.5 ratio — ABNORMAL HIGH (ref 0.0–4.4)
Cholesterol, Total: 316 mg/dL — ABNORMAL HIGH (ref 100–199)
HDL: 42 mg/dL (ref >39)
LDL Chol Calc (NIH): 182 mg/dL — ABNORMAL HIGH (ref 0–99)
Triglycerides: 454 mg/dL — ABNORMAL HIGH (ref 0–149)
VLDL Cholesterol Cal: 92 mg/dL — ABNORMAL HIGH (ref 5–40)

## 2023-02-28 ENCOUNTER — Other Ambulatory Visit: Payer: Self-pay | Admitting: Nurse Practitioner

## 2023-02-28 MED ORDER — TRAZODONE HCL 50 MG PO TABS: 50.0000 mg | ORAL_TABLET | Freq: Every day | ORAL | 2 refills | Status: DC

## 2023-03-11 ENCOUNTER — Other Ambulatory Visit: Payer: Self-pay | Admitting: Nurse Practitioner

## 2023-04-07 ENCOUNTER — Encounter (HOSPITAL_COMMUNITY)
Admission: EM | Disposition: A | Discharge status: Home / Self Care | Payer: Self-pay | Source: Home / Self Care | Attending: Internal Medicine

## 2023-04-07 ENCOUNTER — Telehealth: Payer: Self-pay | Admitting: Nurse Practitioner

## 2023-04-07 ENCOUNTER — Emergency Department (HOSPITAL_COMMUNITY): Payer: Medicare Other

## 2023-04-07 ENCOUNTER — Inpatient Hospital Stay (HOSPITAL_COMMUNITY)
Admission: EM | Admit: 2023-04-07 | Discharge: 2023-04-09 | DRG: 280 | Disposition: A | Discharge status: Home / Self Care | Payer: Medicare Other | Attending: Internal Medicine | Admitting: Internal Medicine

## 2023-04-07 ENCOUNTER — Other Ambulatory Visit: Payer: Self-pay

## 2023-04-07 ENCOUNTER — Encounter (HOSPITAL_COMMUNITY): Payer: Self-pay

## 2023-04-07 ENCOUNTER — Ambulatory Visit: Payer: Medicare Other | Admitting: Nurse Practitioner

## 2023-04-07 DIAGNOSIS — F0394 Unspecified dementia, unspecified severity, with anxiety: Secondary | ICD-10-CM | POA: Diagnosis not present

## 2023-04-07 DIAGNOSIS — D72828 Other elevated white blood cell count: Secondary | ICD-10-CM | POA: Diagnosis present

## 2023-04-07 DIAGNOSIS — R0789 Other chest pain: Secondary | ICD-10-CM | POA: Diagnosis not present

## 2023-04-07 DIAGNOSIS — I2510 Atherosclerotic heart disease of native coronary artery without angina pectoris: Secondary | ICD-10-CM | POA: Diagnosis present

## 2023-04-07 DIAGNOSIS — R079 Chest pain, unspecified: Secondary | ICD-10-CM | POA: Diagnosis present

## 2023-04-07 DIAGNOSIS — M25512 Pain in left shoulder: Secondary | ICD-10-CM | POA: Diagnosis not present

## 2023-04-07 DIAGNOSIS — F411 Generalized anxiety disorder: Secondary | ICD-10-CM | POA: Diagnosis present

## 2023-04-07 DIAGNOSIS — I200 Unstable angina: Secondary | ICD-10-CM | POA: Diagnosis not present

## 2023-04-07 DIAGNOSIS — E785 Hyperlipidemia, unspecified: Secondary | ICD-10-CM | POA: Diagnosis present

## 2023-04-07 DIAGNOSIS — N179 Acute kidney failure, unspecified: Secondary | ICD-10-CM | POA: Diagnosis not present

## 2023-04-07 DIAGNOSIS — E1165 Type 2 diabetes mellitus with hyperglycemia: Secondary | ICD-10-CM | POA: Diagnosis present

## 2023-04-07 DIAGNOSIS — M549 Dorsalgia, unspecified: Secondary | ICD-10-CM | POA: Diagnosis not present

## 2023-04-07 DIAGNOSIS — F0390 Unspecified dementia without behavioral disturbance: Secondary | ICD-10-CM | POA: Diagnosis not present

## 2023-04-07 DIAGNOSIS — I255 Ischemic cardiomyopathy: Secondary | ICD-10-CM | POA: Diagnosis not present

## 2023-04-07 DIAGNOSIS — E872 Acidosis, unspecified: Secondary | ICD-10-CM | POA: Diagnosis present

## 2023-04-07 DIAGNOSIS — Z7982 Long term (current) use of aspirin: Secondary | ICD-10-CM

## 2023-04-07 DIAGNOSIS — Z7984 Long term (current) use of oral hypoglycemic drugs: Secondary | ICD-10-CM

## 2023-04-07 DIAGNOSIS — Z7983 Long term (current) use of bisphosphonates: Secondary | ICD-10-CM | POA: Diagnosis not present

## 2023-04-07 DIAGNOSIS — Z8249 Family history of ischemic heart disease and other diseases of the circulatory system: Secondary | ICD-10-CM

## 2023-04-07 DIAGNOSIS — I25110 Atherosclerotic heart disease of native coronary artery with unstable angina pectoris: Secondary | ICD-10-CM | POA: Diagnosis not present

## 2023-04-07 DIAGNOSIS — Z87891 Personal history of nicotine dependence: Secondary | ICD-10-CM

## 2023-04-07 DIAGNOSIS — I447 Left bundle-branch block, unspecified: Secondary | ICD-10-CM | POA: Diagnosis not present

## 2023-04-07 DIAGNOSIS — I214 Non-ST elevation (NSTEMI) myocardial infarction: Secondary | ICD-10-CM | POA: Diagnosis not present

## 2023-04-07 DIAGNOSIS — E1142 Type 2 diabetes mellitus with diabetic polyneuropathy: Secondary | ICD-10-CM

## 2023-04-07 DIAGNOSIS — I110 Hypertensive heart disease with heart failure: Secondary | ICD-10-CM | POA: Diagnosis not present

## 2023-04-07 DIAGNOSIS — I5021 Acute systolic (congestive) heart failure: Secondary | ICD-10-CM | POA: Diagnosis present

## 2023-04-07 DIAGNOSIS — E663 Overweight: Secondary | ICD-10-CM | POA: Diagnosis present

## 2023-04-07 DIAGNOSIS — E1151 Type 2 diabetes mellitus with diabetic peripheral angiopathy without gangrene: Secondary | ICD-10-CM | POA: Diagnosis present

## 2023-04-07 DIAGNOSIS — E782 Mixed hyperlipidemia: Secondary | ICD-10-CM

## 2023-04-07 DIAGNOSIS — E1169 Type 2 diabetes mellitus with other specified complication: Secondary | ICD-10-CM | POA: Diagnosis present

## 2023-04-07 DIAGNOSIS — Z79899 Other long term (current) drug therapy: Secondary | ICD-10-CM | POA: Diagnosis not present

## 2023-04-07 DIAGNOSIS — I10 Essential (primary) hypertension: Secondary | ICD-10-CM | POA: Diagnosis not present

## 2023-04-07 DIAGNOSIS — Z794 Long term (current) use of insulin: Secondary | ICD-10-CM | POA: Diagnosis not present

## 2023-04-07 DIAGNOSIS — I6930 Unspecified sequelae of cerebral infarction: Secondary | ICD-10-CM

## 2023-04-07 DIAGNOSIS — Z6827 Body mass index (BMI) 27.0-27.9, adult: Secondary | ICD-10-CM

## 2023-04-07 DIAGNOSIS — I708 Atherosclerosis of other arteries: Secondary | ICD-10-CM | POA: Diagnosis present

## 2023-04-07 DIAGNOSIS — Z833 Family history of diabetes mellitus: Secondary | ICD-10-CM

## 2023-04-07 DIAGNOSIS — Z823 Family history of stroke: Secondary | ICD-10-CM | POA: Diagnosis not present

## 2023-04-07 DIAGNOSIS — R000 Tachycardia, unspecified: Secondary | ICD-10-CM | POA: Diagnosis not present

## 2023-04-07 DIAGNOSIS — I69354 Hemiplegia and hemiparesis following cerebral infarction affecting left non-dominant side: Secondary | ICD-10-CM

## 2023-04-07 DIAGNOSIS — Z8673 Personal history of transient ischemic attack (TIA), and cerebral infarction without residual deficits: Secondary | ICD-10-CM

## 2023-04-07 HISTORY — PX: LEFT HEART CATH AND CORONARY ANGIOGRAPHY: CATH118249

## 2023-04-07 LAB — BASIC METABOLIC PANEL
Anion gap: 13 (ref 5–15)
BUN: 22 mg/dL (ref 8–23)
CO2: 18 mmol/L — ABNORMAL LOW (ref 22–32)
Calcium: 8.7 mg/dL — ABNORMAL LOW (ref 8.9–10.3)
Chloride: 104 mmol/L (ref 98–111)
Creatinine, Ser: 1.39 mg/dL — ABNORMAL HIGH (ref 0.44–1.00)
GFR, Estimated: 40 mL/min — ABNORMAL LOW (ref >60)
Glucose, Bld: 315 mg/dL — ABNORMAL HIGH (ref 70–99)
Potassium: 5 mmol/L (ref 3.5–5.1)
Sodium: 135 mmol/L (ref 135–145)

## 2023-04-07 LAB — URINALYSIS, W/ REFLEX TO CULTURE (INFECTION SUSPECTED)
Bilirubin Urine: NEGATIVE
Glucose, UA: 150 mg/dL — AB
Hgb urine dipstick: NEGATIVE
Ketones, ur: 5 mg/dL — AB
Nitrite: POSITIVE — AB
Protein, ur: NEGATIVE mg/dL
Specific Gravity, Urine: 1.028 (ref 1.005–1.030)
pH: 5 (ref 5.0–8.0)

## 2023-04-07 LAB — CBC
HCT: 34.7 % — ABNORMAL LOW (ref 36.0–46.0)
Hemoglobin: 11.5 g/dL — ABNORMAL LOW (ref 12.0–15.0)
MCH: 30.8 pg (ref 26.0–34.0)
MCHC: 33.1 g/dL (ref 30.0–36.0)
MCV: 93 fL (ref 80.0–100.0)
Platelets: 206 10*3/uL (ref 150–400)
RBC: 3.73 MIL/uL — ABNORMAL LOW (ref 3.87–5.11)
RDW: 13.5 % (ref 11.5–15.5)
WBC: 14.4 10*3/uL — ABNORMAL HIGH (ref 4.0–10.5)
nRBC: 0 % (ref 0.0–0.2)

## 2023-04-07 LAB — TROPONIN I (HIGH SENSITIVITY)
Troponin I (High Sensitivity): 339 ng/L (ref <18)
Troponin I (High Sensitivity): 786 ng/L (ref <18)

## 2023-04-07 LAB — PROCALCITONIN: Procalcitonin: 0.14 ng/mL

## 2023-04-07 LAB — BRAIN NATRIURETIC PEPTIDE: B Natriuretic Peptide: 870.1 pg/mL — ABNORMAL HIGH (ref 0.0–100.0)

## 2023-04-07 LAB — GLUCOSE, CAPILLARY: Glucose-Capillary: 300 mg/dL — ABNORMAL HIGH (ref 70–99)

## 2023-04-07 LAB — HEMOGLOBIN A1C
Hgb A1c MFr Bld: 9.5 % — ABNORMAL HIGH (ref 4.8–5.6)
Mean Plasma Glucose: 225.95 mg/dL

## 2023-04-07 LAB — TSH: TSH: 2.254 u[IU]/mL (ref 0.350–4.500)

## 2023-04-07 SURGERY — LEFT HEART CATH AND CORONARY ANGIOGRAPHY
Anesthesia: LOCAL

## 2023-04-07 MED ORDER — SODIUM CHLORIDE 0.9 % IV SOLN: 250.0000 mL | INTRAVENOUS | Status: DC | PRN

## 2023-04-07 MED ORDER — HEPARIN SODIUM (PORCINE) 1000 UNIT/ML IJ SOLN
INTRAMUSCULAR | Status: DC | PRN
  Administered 2023-04-07: 4000 [IU] via INTRAVENOUS

## 2023-04-07 MED ORDER — SODIUM CHLORIDE 0.9 % IV SOLN: INTRAVENOUS | Status: DC

## 2023-04-07 MED ORDER — FUROSEMIDE 10 MG/ML IJ SOLN
40.0000 mg | Freq: Two times a day (BID) | INTRAMUSCULAR | Status: DC
  Administered 2023-04-07 – 2023-04-08 (×2): 40 mg via INTRAVENOUS
  Filled 2023-04-07 (×2): qty 4

## 2023-04-07 MED ORDER — FENTANYL CITRATE (PF) 100 MCG/2ML IJ SOLN
INTRAMUSCULAR | Status: AC
  Filled 2023-04-07: qty 2

## 2023-04-07 MED ORDER — IOHEXOL 350 MG/ML SOLN
100.0000 mL | Freq: Once | INTRAVENOUS | Status: AC | PRN
  Administered 2023-04-07: 100 mL via INTRAVENOUS

## 2023-04-07 MED ORDER — ONDANSETRON HCL 4 MG PO TABS: 4.0000 mg | ORAL_TABLET | Freq: Four times a day (QID) | ORAL | Status: DC | PRN

## 2023-04-07 MED ORDER — LABETALOL HCL 5 MG/ML IV SOLN: 10.0000 mg | INTRAVENOUS | Status: AC | PRN

## 2023-04-07 MED ORDER — POTASSIUM CHLORIDE CRYS ER 20 MEQ PO TBCR
20.0000 meq | EXTENDED_RELEASE_TABLET | Freq: Every day | ORAL | Status: DC
  Administered 2023-04-07 – 2023-04-09 (×3): 20 meq via ORAL
  Filled 2023-04-07 (×3): qty 1

## 2023-04-07 MED ORDER — INSULIN GLARGINE-YFGN 100 UNIT/ML ~~LOC~~ SOLN
30.0000 [IU] | Freq: Every day | SUBCUTANEOUS | Status: DC
  Administered 2023-04-07 – 2023-04-08 (×2): 30 [IU] via SUBCUTANEOUS
  Filled 2023-04-07 (×4): qty 0.3

## 2023-04-07 MED ORDER — ASPIRIN 81 MG PO TBEC: 81.0000 mg | DELAYED_RELEASE_TABLET | Freq: Every day | ORAL | Status: DC

## 2023-04-07 MED ORDER — ESCITALOPRAM OXALATE 10 MG PO TABS
20.0000 mg | ORAL_TABLET | Freq: Every day | ORAL | Status: DC
  Administered 2023-04-07 – 2023-04-09 (×3): 20 mg via ORAL
  Filled 2023-04-07 (×3): qty 2

## 2023-04-07 MED ORDER — MORPHINE SULFATE (PF) 4 MG/ML IV SOLN
2.0000 mg | Freq: Once | INTRAVENOUS | Status: AC
  Administered 2023-04-07: 2 mg via INTRAVENOUS
  Filled 2023-04-07: qty 1

## 2023-04-07 MED ORDER — ASPIRIN 81 MG PO CHEW: 81.0000 mg | CHEWABLE_TABLET | ORAL | Status: DC

## 2023-04-07 MED ORDER — MIDAZOLAM HCL 2 MG/2ML IJ SOLN
INTRAMUSCULAR | Status: DC | PRN
  Administered 2023-04-07 (×2): 1 mg via INTRAVENOUS

## 2023-04-07 MED ORDER — MIRABEGRON ER 25 MG PO TB24
25.0000 mg | ORAL_TABLET | Freq: Every day | ORAL | Status: DC
  Administered 2023-04-07 – 2023-04-09 (×3): 25 mg via ORAL
  Filled 2023-04-07 (×3): qty 1

## 2023-04-07 MED ORDER — DONEPEZIL HCL 10 MG PO TABS
10.0000 mg | ORAL_TABLET | Freq: Every day | ORAL | Status: DC
  Administered 2023-04-07 – 2023-04-08 (×2): 10 mg via ORAL
  Filled 2023-04-07 (×2): qty 1

## 2023-04-07 MED ORDER — SODIUM CHLORIDE 0.9% FLUSH
3.0000 mL | Freq: Two times a day (BID) | INTRAVENOUS | Status: DC
  Administered 2023-04-07 – 2023-04-08 (×3): 3 mL via INTRAVENOUS

## 2023-04-07 MED ORDER — VERAPAMIL HCL 2.5 MG/ML IV SOLN
INTRAVENOUS | Status: DC | PRN
  Administered 2023-04-07: 10 mL via INTRA_ARTERIAL

## 2023-04-07 MED ORDER — FENOFIBRATE 160 MG PO TABS
160.0000 mg | ORAL_TABLET | Freq: Every day | ORAL | Status: DC
  Administered 2023-04-07 – 2023-04-09 (×3): 160 mg via ORAL
  Filled 2023-04-07 (×3): qty 1

## 2023-04-07 MED ORDER — LISINOPRIL 20 MG PO TABS
20.0000 mg | ORAL_TABLET | Freq: Every day | ORAL | Status: DC
  Administered 2023-04-07: 20 mg via ORAL
  Filled 2023-04-07: qty 1

## 2023-04-07 MED ORDER — VERAPAMIL HCL 2.5 MG/ML IV SOLN
INTRAVENOUS | Status: AC
  Filled 2023-04-07: qty 2

## 2023-04-07 MED ORDER — ASPIRIN 81 MG PO CHEW
324.0000 mg | CHEWABLE_TABLET | Freq: Once | ORAL | Status: AC
  Administered 2023-04-07: 324 mg via ORAL
  Filled 2023-04-07: qty 4

## 2023-04-07 MED ORDER — MIDAZOLAM HCL 2 MG/2ML IJ SOLN
INTRAMUSCULAR | Status: AC
  Filled 2023-04-07: qty 2

## 2023-04-07 MED ORDER — ACETAMINOPHEN 325 MG PO TABS
650.0000 mg | ORAL_TABLET | Freq: Four times a day (QID) | ORAL | Status: DC | PRN
  Administered 2023-04-09: 650 mg via ORAL
  Filled 2023-04-07: qty 2

## 2023-04-07 MED ORDER — ATORVASTATIN CALCIUM 40 MG PO TABS
40.0000 mg | ORAL_TABLET | Freq: Every day | ORAL | Status: DC
  Administered 2023-04-07 – 2023-04-09 (×3): 40 mg via ORAL
  Filled 2023-04-07 (×2): qty 1

## 2023-04-07 MED ORDER — HEPARIN SODIUM (PORCINE) 1000 UNIT/ML IJ SOLN
INTRAMUSCULAR | Status: AC
  Filled 2023-04-07: qty 10

## 2023-04-07 MED ORDER — ALPRAZOLAM 0.5 MG PO TABS
1.0000 mg | ORAL_TABLET | Freq: Two times a day (BID) | ORAL | Status: DC | PRN
  Administered 2023-04-08: 1 mg via ORAL
  Filled 2023-04-07: qty 2

## 2023-04-07 MED ORDER — TRAZODONE HCL 50 MG PO TABS
50.0000 mg | ORAL_TABLET | Freq: Every day | ORAL | Status: DC
  Administered 2023-04-07 – 2023-04-08 (×2): 50 mg via ORAL
  Filled 2023-04-07 (×2): qty 1

## 2023-04-07 MED ORDER — HEPARIN BOLUS VIA INFUSION
4000.0000 [IU] | Freq: Once | INTRAVENOUS | Status: AC
  Administered 2023-04-07: 4000 [IU] via INTRAVENOUS
  Filled 2023-04-07: qty 4000

## 2023-04-07 MED ORDER — SODIUM CHLORIDE 0.9% FLUSH: 3.0000 mL | INTRAVENOUS | Status: DC | PRN

## 2023-04-07 MED ORDER — HYDRALAZINE HCL 20 MG/ML IJ SOLN: 10.0000 mg | INTRAMUSCULAR | Status: AC | PRN

## 2023-04-07 MED ORDER — HEPARIN (PORCINE) 25000 UT/250ML-% IV SOLN
800.0000 [IU]/h | INTRAVENOUS | Status: DC
  Administered 2023-04-07: 800 [IU]/h via INTRAVENOUS
  Filled 2023-04-07: qty 250

## 2023-04-07 MED ORDER — ACETAMINOPHEN 650 MG RE SUPP: 650.0000 mg | Freq: Four times a day (QID) | RECTAL | Status: DC | PRN

## 2023-04-07 MED ORDER — INSULIN ASPART 100 UNIT/ML IJ SOLN
0.0000 [IU] | INTRAMUSCULAR | Status: DC
  Administered 2023-04-07: 8 [IU] via SUBCUTANEOUS
  Administered 2023-04-08: 5 [IU] via SUBCUTANEOUS
  Administered 2023-04-08: 3 [IU] via SUBCUTANEOUS
  Administered 2023-04-08 (×2): 5 [IU] via SUBCUTANEOUS
  Administered 2023-04-08 (×2): 3 [IU] via SUBCUTANEOUS

## 2023-04-07 MED ORDER — HEPARIN (PORCINE) IN NACL 1000-0.9 UT/500ML-% IV SOLN
INTRAVENOUS | Status: DC | PRN
  Administered 2023-04-07 (×2): 500 mL

## 2023-04-07 MED ORDER — ONDANSETRON HCL 4 MG/2ML IJ SOLN: 4.0000 mg | Freq: Four times a day (QID) | INTRAMUSCULAR | Status: DC | PRN

## 2023-04-07 MED ORDER — FENTANYL CITRATE (PF) 100 MCG/2ML IJ SOLN
INTRAMUSCULAR | Status: DC | PRN
  Administered 2023-04-07 (×2): 25 ug via INTRAVENOUS

## 2023-04-07 MED ORDER — MEMANTINE HCL 10 MG PO TABS
10.0000 mg | ORAL_TABLET | Freq: Two times a day (BID) | ORAL | Status: DC
  Administered 2023-04-07 – 2023-04-09 (×4): 10 mg via ORAL
  Filled 2023-04-07 (×4): qty 1

## 2023-04-07 MED ORDER — LIDOCAINE HCL (PF) 1 % IJ SOLN
INTRAMUSCULAR | Status: DC | PRN
  Administered 2023-04-07: 2 mL via INTRADERMAL

## 2023-04-07 MED ORDER — LIDOCAINE HCL (PF) 1 % IJ SOLN
INTRAMUSCULAR | Status: AC
  Filled 2023-04-07: qty 30

## 2023-04-07 MED ORDER — IOHEXOL 350 MG/ML SOLN
INTRAVENOUS | Status: DC | PRN
  Administered 2023-04-07: 45 mL

## 2023-04-07 SURGICAL SUPPLY — 13 items
BAND CMPR LRG ZPHR (HEMOSTASIS) ×1
BAND ZEPHYR COMPRESS 30 LONG (HEMOSTASIS) IMPLANT
CATH 5FR JL3.5 JR4 ANG PIG MP (CATHETERS) IMPLANT
CATH LAUNCHER 5F EBU3.0 (CATHETERS) IMPLANT
CATHETER LAUNCHER 5F EBU3.0 (CATHETERS) ×1
GLIDESHEATH SLEND SS 6F .021 (SHEATH) IMPLANT
GUIDEWIRE ANGLED .035X150CM (WIRE) IMPLANT
GUIDEWIRE INQWIRE 1.5J.035X260 (WIRE) IMPLANT
INQWIRE 1.5J .035X260CM (WIRE) ×1
KIT HEART LEFT (KITS) ×2 IMPLANT
PACK CARDIAC CATHETERIZATION (CUSTOM PROCEDURE TRAY) ×2 IMPLANT
TRANSDUCER W/STOPCOCK (MISCELLANEOUS) ×2 IMPLANT
TUBING CIL FLEX 10 FLL-RA (TUBING) ×2 IMPLANT

## 2023-04-07 NOTE — Hospital Course (Addendum)
76 y.o.f with DM, HTN, HLD, carotid disease, hx stroke, and anxiety who presented with 1 month back and left shoulder pain and now chest pain that started three days PTA, is associated with shortness of breath and pain radiating down the arms.Woke  6/6 AM ith pain at rest, not relieved with nitro, and so husband called EMS who administered aspirin 325. In the ER, ECG showed old LBBB with sinus tach, Troponin was slightly elevated, CXR showed pulmonary edema.  Started on heparin and hospitalists called and admission was requested.  Labs with blood sugar 315 creatinine 1.39, troponin 339> 786, BNP 870 procalcitonin 0.14, leukocytosis 14.4 UA with WBC 6-10 small  LA and nitrate positive CT angio chest negative for aneurysm/dissection, severe stenosis of proximal left subclavian artery due to atherosclerotic plaque, Non opacification of the proximal common iliac arteries with reconstitution immediately proximal to the bifurcation, no PE, aortic atherosclerosis, evidence of pulmonary edema.  Seen by cardiology 6/6: Cardiac cath showed severe calcific multivessel coronary artery disease including left main disease, total occlusion of the RCA and severe restenosis in the LAD and circumflex vessel> not a candidate for CABG and advised medical therapy and CHF management.

## 2023-04-07 NOTE — H&P (View-Only) (Signed)
Cardiology Consultation   Patient ID: Kylie Ward MRN: [id number]; DOB: October 31, 1946  Admit date: 04/07/2023 Date of Consult: 04/07/2023  PCP:  Bennie Pierini, FNP   [facility] Providers Cardiologist:  New  Patient Profile:   Kylie Ward is a 77 y.o. female with a hx of dementia, HTN, HLD, DM, CVA, Carotid artery stenosis s/p left ICA stenting and angioplasty, right carotid artery endarterectomy who is being seen 04/07/2023 for the evaluation of elevated troponin at the request of Dr. Maryfrances Bunnell.  History of Present Illness:   Kylie Ward is a 77 year old female with past medical history noted above.  She has not been evaluated by cardiology in the past.  She has been followed by her PCP on a regular basis.  She underwent left carotid artery stenting/angioplasty in 2011 and right carotid artery endarterectomy 2015.  She currently lives at home with her husband.  She is able to give some of her history, family does feel in additional details.  Reports she has been in her usual state of health up until last evening when she developed significant centralized chest pain with radiation through to her back as well as dyspnea.  Symptoms lingered throughout the evening into this morning.  Ultimately was brought in via EMS and given aspirin en route.  Labs in the ED showed sodium 135, potassium 5, creatinine 1.39, BNP 870, high-sensitivity troponin 339>> 786, WBC 14.4, hemoglobin 11.5.  CT angio chest with severe stenosis of proximal left subclavian artery, pulmonary edema, trace bilateral pleural effusions, no PE.  EKG shows sinus tachycardia, 111 bpm left bundle branch block.  She was admitted to internal medicine for further management.  Started on IV heparin, given IV Lasix.  Cardiology asked to evaluate.  Past Medical History:  Diagnosis Date   Anxiety    Diabetes mellitus without complication (HCC)    Hyperlipidemia    Hypertension    Stroke [facility])     Past  Surgical History:  Procedure Laterality Date   CAROTID ENDARTERECTOMY Right    CAROTID STENT       Home Medications:  Prior to Admission medications   Medication Sig Start Date End Date Taking? Authorizing Provider  traZODone (DESYREL) 50 MG tablet Take 1 tablet (50 mg total) by mouth at bedtime. 02/28/23   Bennie Pierini, FNP  Accu-Chek FastClix Lancets MISC CHECK BLOOD SUGAR UP TO 3 TIMES A DAY AS DIRECTED Dx E11.42 02/15/22   Bennie Pierini, FNP  alendronate (FOSAMAX) 70 MG tablet Take 1 tablet (70 mg total) by mouth once a week. Take with a full glass of water on an empty stomach. 05/11/21   Daphine Deutscher, Mary-Margaret, FNP  ALPRAZolam Prudy Feeler) 1 MG tablet Take 1 tablet (1 mg total) by mouth 2 (two) times daily. 10/01/22   Daphine Deutscher Mary-Margaret, FNP  aspirin 81 MG tablet Take 81 mg by mouth daily.    [provider]  Blood Glucose Monitoring Suppl (ACCU-CHEK NANO SMARTVIEW) w/Device KIT CHECK BLOOD SUGER UP TO 3 TIMES A DAY 06/27/18   Daphine Deutscher, Mary-Margaret, FNP  Continuous Blood Gluc Receiver (FREESTYLE LIBRE 2 READER) DEVI Use to test blood sugar continuously. DX: E11.65 06/24/22   Daphine Deutscher Mary-Margaret, FNP  Continuous Blood Gluc Sensor (FREESTYLE LIBRE 2 SENSOR) MISC Use to test blood sugar continuously. Place new sensor on back of the arm every 14 days as directed.  DX: E11.65 06/24/22   Daphine Deutscher, Mary-Margaret, FNP  donepezil (ARICEPT) 10 MG tablet Take 1 tablet (10 mg total) by mouth at  bedtime. 10/01/22   Daphine Deutscher, Mary-Margaret, FNP  Dulaglutide (TRULICITY) 4.5 MG/0.5ML SOPN Inject 4.5 mg as directed once a week. 01/04/23   Daphine Deutscher Mary-Margaret, FNP  Elastic Bandages & Supports (V-2 HIGH COMPRESSION HOSE) MISC 1 each by Does not apply route daily. 02/12/16   Daphine Deutscher, Mary-Margaret, FNP  escitalopram (LEXAPRO) 20 MG tablet Take 1 tablet (20 mg total) by mouth daily. 10/01/22   Daphine Deutscher, Mary-Margaret, FNP  fenofibrate 160 MG tablet Take 1 tablet (160 mg total) by mouth daily. 10/01/22    Daphine Deutscher, Mary-Margaret, FNP  furosemide (LASIX) 40 MG tablet Take 1 tablet (40 mg total) by mouth daily. 10/01/22   Daphine Deutscher, Mary-Margaret, FNP  glucose blood (ACCU-CHEK SMARTVIEW) test strip CHECK BLOOD SUGAR UP TO 3 TIMES A DAY AS DIRECTED 07/10/21   Daphine Deutscher, Mary-Margaret, FNP  insulin glargine (LANTUS) 100 UNIT/ML injection Inject 0.65 mLs (65 Units total) into the skin at bedtime. 10/01/22 10/08/24  Daphine Deutscher Mary-Margaret, FNP  insulin lispro (HUMALOG) 100 UNIT/ML injection Sliding scale up to 12u TID 05/11/21   Daphine Deutscher, Mary-Margaret, FNP  Insulin Pen Needle (SURE COMFORT PEN NEEDLES) 31G X 8 MM MISC USE TO inject lantus ONCE daily 12/04/19   Daphine Deutscher, Mary-Margaret, FNP  lisinopril (ZESTRIL) 20 MG tablet Take 1 tablet (20 mg total) by mouth daily. 10/01/22   Daphine Deutscher Mary-Margaret, FNP  memantine (NAMENDA) 10 MG tablet TAKE ONE TABLET BY MOUTH TWICE DAILY 03/11/23   Daphine Deutscher, Mary-Margaret, FNP  metFORMIN (GLUCOPHAGE-XR) 750 MG 24 hr tablet Take 1 tablet (750 mg total) by mouth 2 (two) times daily with a meal. 10/01/22   Daphine Deutscher, Mary-Margaret, FNP  mirabegron ER (MYRBETRIQ) 25 MG TB24 tablet Take 1 tablet (25 mg total) by mouth daily. 10/01/22   Daphine Deutscher, Mary-Margaret, FNP  ondansetron (ZOFRAN) 4 MG tablet Take 1 tablet (4 mg total) by mouth every 8 (eight) hours as needed for nausea or vomiting. 06/24/21   Daryll Drown, NP  simvastatin (ZOCOR) 80 MG tablet Take 1 tablet (80 mg total) by mouth daily. 10/01/22   Bennie Pierini, FNP    Inpatient Medications: Scheduled Meds:  furosemide  40 mg Intravenous BID   potassium chloride  20 mEq Oral Daily   Continuous Infusions:  heparin 800 Units/hr (04/07/23 1159)   PRN Meds:   Allergies:    Allergies  Allergen Reactions   Lipitor [Atorvastatin]     Joint pain / myalgias    Niaspan [Niacin Er]     Burn and itch all over    Social History:   Social History   Socioeconomic History   Marital status: Married    Spouse name: Animal nutritionist   Number of  children: 1   Years of education: 10   Highest education level: 10th grade  Occupational History   Occupation: retired    Associate Professor: [employer]  Tobacco Use   Smoking status: Former    Packs/day: 1    Types: Cigarettes    Quit date: 12/02/2009    Years since quitting: 13.3   Smokeless tobacco: Never  Vaping Use   Vaping Use: Never used  Substance and Sexual Activity   Alcohol use: No   Drug use: No   Sexual activity: Yes    Birth control/protection: Post-menopausal  Other Topics Concern   Not on file  Social History Narrative   One level living with her husband   Social Determinants of Health   Financial Resource Strain: Low Risk  (05/10/2022)   Overall Financial Resource Strain (CARDIA)    Difficulty of  Paying Living Expenses: Not hard at all  Food Insecurity: No Food Insecurity (05/10/2022)   Hunger Vital Sign    Worried About Running Out of Food in the Last Year: Never true    Ran Out of Food in the Last Year: Never true  Transportation Needs: No Transportation Needs (05/10/2022)   PRAPARE - Administrator, Civil Service (Medical): No    Lack of Transportation (Non-Medical): No  Physical Activity: Inactive (05/10/2022)   Exercise Vital Sign    Days of Exercise per Week: 0 days    Minutes of Exercise per Session: 0 min  Stress: No Stress Concern Present (05/10/2022)   [employer] of Occupational Health - Occupational Stress Questionnaire    Feeling of Stress : Only a little  Social Connections: Socially Isolated (05/07/2021)   Social Connection and Isolation Panel [NHANES]    Frequency of Communication with Friends and Family: Once a week    Frequency of Social Gatherings with Friends and Family: Once a week    Attends Religious Services: Never    Database administrator or Organizations: No    Attends Banker Meetings: Never    Marital Status: Married  Catering manager Violence: Not At Risk (05/07/2021)   Humiliation, Afraid, Rape, and Kick  questionnaire    Fear of Current or Ex-Partner: No    Emotionally Abused: No    Physically Abused: No    Sexually Abused: No    Family History:    Family History  Problem Relation Age of Onset   Heart disease Father    Heart attack Father    Hypertension Father    Diabetes Sister    Stroke Sister    Diabetes Brother    Diabetes Sister    Diabetes Sister    Diabetes Brother    Diabetes Brother    Diabetes Brother      ROS:  Please see the history of present illness.   All other ROS reviewed and negative.     Physical Exam/Data:   Vitals:   04/07/23 1445 04/07/23 1500 04/07/23 1505 04/07/23 1521  BP: 135/71 (!) 142/80    Pulse: (!) 106 (!) 109    Resp: (!) 25 (!) 23  (!) 23  Temp:   98 F (36.7 C) 98.4 F (36.9 C)  TempSrc:   Oral Oral  SpO2: 94% 95%    Weight:      Height:        Intake/Output Summary (Last 24 hours) at 04/07/2023 1522 Last data filed at 04/07/2023 1416 Gross per 24 hour  Intake --  Output 360 ml  Net -360 ml      04/07/2023    8:36 AM 01/04/2023    2:52 PM 10/01/2022    3:07 PM  Last 3 Weights  Weight (lbs) 160 lb 15 oz 163 lb 162 lb  Weight (kg) 73 kg 73.936 kg 73.483 kg     Body mass index is 27.62 kg/m.  General:  Older female, no distress HEENT: normal Neck: no JVD Vascular: No carotid bruits; Distal pulses 2+ bilaterally Cardiac:  normal S1, S2; RRR; no murmur  Lungs:  Diminished in bases   Abd: soft, nontender, no hepatomegaly  Ext: 1-2+ bilateral LE edema Musculoskeletal:  No deformities, BUE and BLE strength normal and equal Skin: warm and dry  Neuro:  CNs 2-12 intact, no focal abnormalities noted Psych:  Normal affect   EKG:  The EKG was personally reviewed and demonstrates:  Sinus tachycardia, 111bpm LBBB  Relevant CV Studies:  N/a   Laboratory Data:  High Sensitivity Troponin:   Recent Labs  Lab 04/07/23 0838 04/07/23 1136  TROPONINIHS 339* 786*     Chemistry Recent Labs  Lab 04/07/23 0838  NA 135  K 5.0   CL 104  CO2 18*  GLUCOSE 315*  BUN 22  CREATININE 1.39*  CALCIUM 8.7*  GFRNONAA 40*  ANIONGAP 13    No results for input(s): "PROT", "ALBUMIN", "AST", "ALT", "ALKPHOS", "BILITOT" in the last 168 hours. Lipids No results for input(s): "CHOL", "TRIG", "HDL", "LABVLDL", "LDLCALC", "CHOLHDL" in the last 168 hours.  Hematology Recent Labs  Lab 04/07/23 0838  WBC 14.4*  RBC 3.73*  HGB 11.5*  HCT 34.7*  MCV 93.0  MCH 30.8  MCHC 33.1  RDW 13.5  PLT 206   Thyroid No results for input(s): "TSH", "FREET4" in the last 168 hours.  BNP Recent Labs  Lab 04/07/23 0838  BNP 870.1*    DDimer No results for input(s): "DDIMER" in the last 168 hours.   Radiology/Studies:  CT Angio Chest/Abd/Pel for Dissection W and/or Wo Contrast  Result Date: 04/07/2023 CLINICAL DATA:  Three day history of intermittent chest pain associated with tachycardia EXAM: CT ANGIOGRAPHY CHEST, ABDOMEN AND PELVIS TECHNIQUE: Non-contrast CT of the chest was initially obtained. Multidetector CT imaging through the chest, abdomen and pelvis was performed using the standard protocol during bolus administration of intravenous contrast. Multiplanar reconstructed images and MIPs were obtained and reviewed to evaluate the vascular anatomy. RADIATION DOSE REDUCTION: This exam was performed according to the departmental dose-optimization program which includes automated exposure control, adjustment of the mA and/or kV according to patient size and/or use of iterative reconstruction technique. CONTRAST:  OMNIPAQUE IOHEXOL 350 MG/ML SOLN COMPARISON:  Chest radiograph dated 04/07/2023 FINDINGS: CTA CHEST FINDINGS Cardiovascular: Preferential opacification of the thoracic aorta. No evidence of thoracic aortic aneurysm or dissection. Left atrial enlargement. No pericardial effusion. Anatomic variant common origin of the brachiocephalic and common carotid arteries. Severe stenosis of the proximal left subclavian artery due to  atherosclerotic plaque. No pulmonary emboli to the level of the subsegmental arteries. Coronary artery calcifications. Mediastinum/Nodes: Imaged thyroid gland without nodules meeting criteria for imaging follow-up by size. Normal esophagus. No pathologically enlarged axillary, supraclavicular, mediastinal, or hilar lymph nodes. Lungs/Pleura: The central airways are patent. Mild bilateral interlobular septal thickening with bilateral perihilar peribronchovascular ground-glass densities. No pneumothorax. Trace bilateral pleural effusions, left-greater-than-right. Musculoskeletal: No acute or abnormal lytic or blastic osseous lesions. Review of the MIP images confirms the above findings. CTA ABDOMEN AND PELVIS FINDINGS VASCULAR Aorta: Aortic atherosclerosis. Normal caliber aorta without aneurysm, dissection, vasculitis or significant stenosis. Celiac: Patent without evidence of aneurysm, dissection, vasculitis or significant stenosis. SMA: Mild-to-moderate narrowing of the SMA origin due to atherosclerotic plaque. No evidence of aneurysm, dissection, or vasculitis. Renals: 2 right and 3 left renal arteries. Moderate narrowing of the dominant bilateral renal arteries due to atherosclerotic plaque. No evidence of aneurysm, dissection, or vasculitis. IMA: Patent without evidence of aneurysm, dissection, vasculitis or significant stenosis. Inflow: Non opacification proximal common iliac arteries. Reconstitution immediately proximal to the bifurcation. Segmental mild luminal narrowing of the external and internal iliac arteries to atherosclerotic plaque. Proximal Outflow: Mild narrowing of the common femoral arteries due to atherosclerotic plaque. Visualized portions of the superficial and profunda femoral arteries are patent without evidence of aneurysm, dissection, vasculitis or significant stenosis. Veins: No obvious venous abnormality within the limitations of this arterial phase study.  Review of the MIP images confirms  the above findings. NON-VASCULAR Hepatobiliary: No focal hepatic lesions. No intra or extrahepatic biliary ductal dilation. Normal gallbladder. Pancreas: No focal lesions or main ductal dilation. Spleen: Normal in size without focal abnormality. Adrenals/Urinary Tract: No adrenal nodules. No suspicious renal mass, calculi or hydronephrosis. No focal bladder wall thickening. Stomach/Bowel: Normal appearance of the stomach. No evidence of bowel wall thickening, distention, or inflammatory changes. Normal appendix. Lymphatic: No enlarged abdominal or pelvic lymph nodes. Reproductive: No adnexal masses. Other: No free fluid, fluid collection, or free air. Musculoskeletal: No acute or abnormal lytic or blastic osseous lesions. Review of the MIP images confirms the above findings. IMPRESSION: 1. No evidence of thoracic or abdominal aortic aneurysm or dissection. 2. Non opacification of the proximal common iliac arteries with reconstitution immediately proximal to the bifurcation. 3. Severe stenosis of the proximal left subclavian artery due to atherosclerotic plaque. 4. Mild bilateral interlobular septal thickening with bilateral perihilar peribronchovascular ground-glass densities, keeping with pulmonary edema. 5. Trace bilateral pleural effusions, left-greater-than-right. 6. No pulmonary emboli to the level of the subsegmental arteries. 7. No acute findings in the abdomen or pelvis. 8. Aortic Atherosclerosis (ICD10-I70.0). Coronary artery calcifications. Assessment for potential risk factor modification, dietary therapy or pharmacologic therapy may be warranted, if clinically indicated. Electronically Signed   By: Agustin Cree M.D.   On: 04/07/2023 12:04   DG Chest Port 1 View  Result Date: 04/07/2023 CLINICAL DATA:  Three day history of intermittent chest pain EXAM: PORTABLE CHEST 1 VIEW COMPARISON:  None Available. FINDINGS: Normal lung volumes. Prominent bilateral perihilar vasculature. No pleural effusion or  pneumothorax. Enlarged cardiomediastinal silhouette is likely projectional. No acute osseous abnormality. IMPRESSION: 1. Prominent bilateral perihilar vasculature, which may be seen in the setting of pulmonary edema. 2. Normal radiographic appearance of the partially imaged left shoulder. Electronically Signed   By: Agustin Cree M.D.   On: 04/07/2023 09:29     Assessment and Plan:   DEQUITA ROSENBALM is a 77 y.o. female with a hx of dementia, HTN, HLD, DM, CVA, Carotid artery stenosis who is being seen 04/07/2023 for the evaluation of elevated troponin at the request of Dr. Maryfrances Bunnell.  NSTEMI -- developed centralized chest pain that started last night, with associated shortness of breath with radiation into her back and shoulders.  Symptoms lingered for several hours, ultimately brought in via EMS.  High-sensitivity troponin 339>>786.  EKG showed sinus tachycardia, 111 bpm, left bundle branch block.  Reports significant improvement in her symptoms since being started on IV heparin. -- Continue IV heparin, add aspirin, statin and Metoprolol 25 mg twice daily -- Plan for cardiac catheterization today -- Check Echocardiogram  Acute CHF Bilateral pleural effusions -- BNP 870, CT angio with pulmonary edema, bilateral pleural effusions -- Currently on IV Lasix 40 mg twice daily -- check echo as above  HTN -- Blood pressures elevated in the ED -- As above add metoprolol 25 mg twice daily  Hyperlipidemia -- On simvastatin 80 mg, fenofibrate 160 mg daily PTA -- Check lipids in a.m.  Diabetes -- SSI while inpatient -- Check hemoglobin A1c  Leukocytosis -- Denies any cough congestion, nausea vomiting or urinary symptoms prior to admission.  Suspect could be reactive -- Afebrile, WBC 14.4 -- Procalcitonin pending -- per primary  PAD Carotid artery disease Subclavian stenosis -- CT chest shows severe stenosis of proximal left subclavian artery -- will need VVS follow up  AKI vs CKD? -- Cr  1.39, down from  previous 1.51 (12/2022)  Risk Assessment/Risk Scores:   TIMI Risk Score for Unstable Angina or Non-ST Elevation MI:   The patient's TIMI risk score is 5, which indicates a 26% risk of all cause mortality, new or recurrent myocardial infarction or need for urgent revascularization in the next 14 days.  New York Heart Association (NYHA) Functional Class NYHA Class II   For questions or updates, please contact [facility] Please consult [email or web address] for contact info under    Signed, Laverda Page, NP  04/07/2023 3:22 PM   Attending Note:   The patient was seen and examined.  Agree with assessment and plan as noted above.  Changes made to the above note as needed.  Patient seen and independently examined with Laverda Page, NP .   We discussed all aspects of the encounter. I agree with the assessment and plan as stated above.   Unstable angina/acute coronary syndrome: Kylie Ward presents with symptoms consistent with an acute coronary syndrome.  She developed severe chest pain with radiation through to her intrascapular region.  This was associated with some shortness of breath and sweats.  She presented to the ER.  Her troponin levels are elevated.  ECG shows sinus tachycardia with a left bundle branch block.  Left bundle branch block was present at least as far back as July, 2022.   I discussed heart catheterization with her.  We discussed the risk, benefits, options of heart catheterization.  She understands and agrees to proceed.  Discussed also with her husband who has had heart catheterizations before.  Her daughter was also present in room.   3.  Left bundle branch block: Echocardiogram has been ordered.   I have spent a total of 40 minutes with patient reviewing hospital  notes , telemetry, EKGs, labs and examining patient as well as establishing an assessment and plan that was discussed with the patient.  > 50% of time was spent in direct patient  care.    Kylie Ward, Montez Hageman., MD, [facility] 04/07/2023, 3:33 PM [address],  [address] Office [phone] Pager [phone]

## 2023-04-07 NOTE — Progress Notes (Signed)
Heart Failure Navigator Progress Note  Assessed for Heart & Vascular TOC clinic readiness.  Patient does not meet criteria due to PMH of dementia.   Navigator available for reassessment of patient.   Sharen Hones, PharmD, BCPS Heart Failure Stewardship Pharmacist Phone [phone]

## 2023-04-07 NOTE — ED Notes (Signed)
ED TO INPATIENT HANDOFF REPORT  ED Nurse Name and Phone #: Dot Lanes paramedic [phone]  S Name/Age/Gender Kylie Ward 77 y.o. female Room/Bed: 003C/003C  Code Status   Code Status: Full Code  Home/SNF/Other Home Patient oriented to: self, place, and situation Is this baseline? Yes   Triage Complete: Triage complete  Chief Complaint Chest pain [R07.9]  Triage Note Back and left shoulder pain x 1 month. Reports intermittent chest pain  x 3 days that is worse today. LBB per EMS. 324mg  Aspirin given by EMS. CBG 315. Hx of anxiety and dementia.  EMS VS:  120/80 HR 90    Allergies Allergies  Allergen Reactions   Lipitor [Atorvastatin]     Joint pain / myalgias    Niaspan [Niacin Er]     Burn and itch all over    Level of Care/Admitting Diagnosis ED Disposition     ED Disposition  Admit   Condition  --   Comment  Hospital Area: [facility] [[id number]]  Level of Care: Telemetry Cardiac [103]  May place patient in observation at [facility] or [facility] if equivalent level of care is available:: No  Covid Evaluation: Asymptomatic - no recent exposure (last 10 days) testing not required  Diagnosis: Chest pain [454098]  Admitting Physician: Alberteen Sam [[id number]]  Attending Physician: Alberteen Sam [[id number]]          B Medical/Surgery History Past Medical History:  Diagnosis Date   Anxiety    Diabetes mellitus without complication (HCC)    Hyperlipidemia    Hypertension    Stroke [facility])    Past Surgical History:  Procedure Laterality Date   CAROTID ENDARTERECTOMY Right    CAROTID STENT       A IV Location/Drains/Wounds Patient Lines/Drains/Airways Status     Active Line/Drains/Airways     Name Placement date Placement time Site Days   Peripheral IV 04/07/23 22 G Left;Posterior Hand 04/07/23  0840  Hand  less than 1   Peripheral IV 04/07/23 20 G Anterior;Left Forearm 04/07/23  1102  Forearm  less than 1             Intake/Output Last 24 hours No intake or output data in the 24 hours ending 04/07/23 1346  Labs/Imaging Results for orders placed or performed during the hospital encounter of 04/07/23 (from the past 48 hour(s))  Basic metabolic panel     Status: Abnormal   Collection Time: 04/07/23  8:38 AM  Result Value Ref Range   Sodium 135 135 - 145 mmol/L   Potassium 5.0 3.5 - 5.1 mmol/L    Comment: HEMOLYSIS AT THIS LEVEL MAY AFFECT RESULT   Chloride 104 98 - 111 mmol/L   CO2 18 (L) 22 - 32 mmol/L   Glucose, Bld 315 (H) 70 - 99 mg/dL    Comment: Glucose reference range applies only to samples taken after fasting for at least 8 hours.   BUN 22 8 - 23 mg/dL   Creatinine, Ser 0.86 (H) 0.44 - 1.00 mg/dL   Calcium 8.7 (L) 8.9 - 10.3 mg/dL   GFR, Estimated 40 (L) >60 mL/min    Comment: (NOTE) Calculated using the CKD-EPI Creatinine Equation (2021)    Anion gap 13 5 - 15    Comment: Performed at [facility], [address], [place], [place] [address]  CBC     Status: Abnormal   Collection Time: 04/07/23  8:38 AM  Result Value Ref Range  WBC 14.4 (H) 4.0 - 10.5 K/uL   RBC 3.73 (L) 3.87 - 5.11 MIL/uL   Hemoglobin 11.5 (L) 12.0 - 15.0 g/dL   HCT 60.6 (L) 30.1 - 60.1 %   MCV 93.0 80.0 - 100.0 fL   MCH 30.8 26.0 - 34.0 pg   MCHC 33.1 30.0 - 36.0 g/dL   RDW 09.3 23.5 - 57.3 %   Platelets 206 150 - 400 K/uL   nRBC 0.0 0.0 - 0.2 %    Comment: Performed at [facility], [address], [place], [place] [address]  Troponin I (High Sensitivity)     Status: Abnormal   Collection Time: 04/07/23  8:38 AM  Result Value Ref Range   Troponin I (High Sensitivity) 339 (HH) <18 ng/L    Comment: CRITICAL RESULT CALLED TO, READ BACK BY AND VERIFIED WITH Ramzi Brathwaite EMP.@1011  ON 6.6.24 BY TCALDWELL MT. (NOTE) Elevated high sensitivity troponin I (hsTnI) values and significant  changes across serial measurements may suggest ACS but many other  chronic and acute conditions are  known to elevate hsTnI results.  Refer to the "Links" section for chest pain algorithms and additional  guidance. Performed at [facility], [address], [place], [place] [address]   Brain natriuretic peptide     Status: Abnormal   Collection Time: 04/07/23  8:38 AM  Result Value Ref Range   B Natriuretic Peptide 870.1 (H) 0.0 - 100.0 pg/mL    Comment: Performed at [facility], [address] [place], [place] [address]  Troponin I (High Sensitivity)     Status: Abnormal   Collection Time: 04/07/23 11:36 AM  Result Value Ref Range   Troponin I (High Sensitivity) 786 (HH) <18 ng/L    Comment: CRITICAL VALUE NOTED. VALUE IS CONSISTENT WITH PREVIOUSLY REPORTED/CALLED VALUE (NOTE) Elevated high sensitivity troponin I (hsTnI) values and significant  changes across serial measurements may suggest ACS but many other  chronic and acute conditions are known to elevate hsTnI results.  Refer to the "Links" section for chest pain algorithms and additional  guidance. Performed at [facility], [address], [place], [place] [address]    CT Angio Chest/Abd/Pel for Dissection W and/or Wo Contrast  Result Date: 04/07/2023 CLINICAL DATA:  Three day history of intermittent chest pain associated with tachycardia EXAM: CT ANGIOGRAPHY CHEST, ABDOMEN AND PELVIS TECHNIQUE: Non-contrast CT of the chest was initially obtained. Multidetector CT imaging through the chest, abdomen and pelvis was performed using the standard protocol during bolus administration of intravenous contrast. Multiplanar reconstructed images and MIPs were obtained and reviewed to evaluate the vascular anatomy. RADIATION DOSE REDUCTION: This exam was performed according to the departmental dose-optimization program which includes automated exposure control, adjustment of the mA and/or kV according to patient size and/or use of iterative reconstruction technique. CONTRAST:  OMNIPAQUE IOHEXOL 350 MG/ML SOLN  COMPARISON:  Chest radiograph dated 04/07/2023 FINDINGS: CTA CHEST FINDINGS Cardiovascular: Preferential opacification of the thoracic aorta. No evidence of thoracic aortic aneurysm or dissection. Left atrial enlargement. No pericardial effusion. Anatomic variant common origin of the brachiocephalic and common carotid arteries. Severe stenosis of the proximal left subclavian artery due to atherosclerotic plaque. No pulmonary emboli to the level of the subsegmental arteries. Coronary artery calcifications. Mediastinum/Nodes: Imaged thyroid gland without nodules meeting criteria for imaging follow-up by size. Normal esophagus. No pathologically enlarged axillary, supraclavicular, mediastinal, or hilar lymph nodes. Lungs/Pleura: The central airways are patent. Mild bilateral interlobular septal thickening with  bilateral perihilar peribronchovascular ground-glass densities. No pneumothorax. Trace bilateral pleural effusions, left-greater-than-right. Musculoskeletal: No acute or abnormal lytic or blastic osseous lesions. Review of the MIP images confirms the above findings. CTA ABDOMEN AND PELVIS FINDINGS VASCULAR Aorta: Aortic atherosclerosis. Normal caliber aorta without aneurysm, dissection, vasculitis or significant stenosis. Celiac: Patent without evidence of aneurysm, dissection, vasculitis or significant stenosis. SMA: Mild-to-moderate narrowing of the SMA origin due to atherosclerotic plaque. No evidence of aneurysm, dissection, or vasculitis. Renals: 2 right and 3 left renal arteries. Moderate narrowing of the dominant bilateral renal arteries due to atherosclerotic plaque. No evidence of aneurysm, dissection, or vasculitis. IMA: Patent without evidence of aneurysm, dissection, vasculitis or significant stenosis. Inflow: Non opacification proximal common iliac arteries. Reconstitution immediately proximal to the bifurcation. Segmental mild luminal narrowing of the external and internal iliac arteries to  atherosclerotic plaque. Proximal Outflow: Mild narrowing of the common femoral arteries due to atherosclerotic plaque. Visualized portions of the superficial and profunda femoral arteries are patent without evidence of aneurysm, dissection, vasculitis or significant stenosis. Veins: No obvious venous abnormality within the limitations of this arterial phase study. Review of the MIP images confirms the above findings. NON-VASCULAR Hepatobiliary: No focal hepatic lesions. No intra or extrahepatic biliary ductal dilation. Normal gallbladder. Pancreas: No focal lesions or main ductal dilation. Spleen: Normal in size without focal abnormality. Adrenals/Urinary Tract: No adrenal nodules. No suspicious renal mass, calculi or hydronephrosis. No focal bladder wall thickening. Stomach/Bowel: Normal appearance of the stomach. No evidence of bowel wall thickening, distention, or inflammatory changes. Normal appendix. Lymphatic: No enlarged abdominal or pelvic lymph nodes. Reproductive: No adnexal masses. Other: No free fluid, fluid collection, or free air. Musculoskeletal: No acute or abnormal lytic or blastic osseous lesions. Review of the MIP images confirms the above findings. IMPRESSION: 1. No evidence of thoracic or abdominal aortic aneurysm or dissection. 2. Non opacification of the proximal common iliac arteries with reconstitution immediately proximal to the bifurcation. 3. Severe stenosis of the proximal left subclavian artery due to atherosclerotic plaque. 4. Mild bilateral interlobular septal thickening with bilateral perihilar peribronchovascular ground-glass densities, keeping with pulmonary edema. 5. Trace bilateral pleural effusions, left-greater-than-right. 6. No pulmonary emboli to the level of the subsegmental arteries. 7. No acute findings in the abdomen or pelvis. 8. Aortic Atherosclerosis (ICD10-I70.0). Coronary artery calcifications. Assessment for potential risk factor modification, dietary therapy or  pharmacologic therapy may be warranted, if clinically indicated. Electronically Signed   By: Agustin Cree M.D.   On: 04/07/2023 12:04   DG Chest Port 1 View  Result Date: 04/07/2023 CLINICAL DATA:  Three day history of intermittent chest pain EXAM: PORTABLE CHEST 1 VIEW COMPARISON:  None Available. FINDINGS: Normal lung volumes. Prominent bilateral perihilar vasculature. No pleural effusion or pneumothorax. Enlarged cardiomediastinal silhouette is likely projectional. No acute osseous abnormality. IMPRESSION: 1. Prominent bilateral perihilar vasculature, which may be seen in the setting of pulmonary edema. 2. Normal radiographic appearance of the partially imaged left shoulder. Electronically Signed   By: Agustin Cree M.D.   On: 04/07/2023 09:29    Pending Labs Unresulted Labs (From admission, onward)     Start     Ordered   04/08/23 0500  Heparin level (unfractionated)  Daily,   R      04/07/23 1142   04/08/23 0500  CBC  Daily,   R      04/07/23 1142   04/07/23 1800  Heparin level (unfractionated)  Once-Timed,   URGENT        04/07/23 1142  Signed and Held  Hemoglobin A1c  Once,   R       Comments: To assess prior glycemic control    Signed and Held   Signed and Held  Basic metabolic panel  Tomorrow morning,   R        Signed and Held            Vitals/Pain Today's Vitals   04/07/23 0915 04/07/23 0923 04/07/23 1014 04/07/23 1232  BP: (!) 161/93     Pulse: (!) 110     Resp: 19     Temp:    98.6 F (37 C)  TempSrc:    Oral  SpO2: 98%     Weight:      Height:      PainSc:  10-Worst pain ever 10-Worst pain ever     Isolation Precautions No active isolations  Medications Medications  heparin ADULT infusion 100 units/mL (25000 units/255mL) (800 Units/hr Intravenous New Bag/Given 04/07/23 1159)  furosemide (LASIX) injection 40 mg (has no administration in time range)  potassium chloride SA (KLOR-CON M) CR tablet 20 mEq (has no administration in time range)  aspirin chewable  tablet 324 mg (324 mg Oral Given 04/07/23 0924)  morphine (PF) 4 MG/ML injection 2 mg (2 mg Intravenous Given 04/07/23 0926)  iohexol (OMNIPAQUE) 350 MG/ML injection 100 mL (100 mLs Intravenous Contrast Given 04/07/23 1120)  heparin bolus via infusion 4,000 Units (4,000 Units Intravenous Bolus from Bag 04/07/23 1200)    Mobility power wheelchair     Focused Assessments     R Recommendations: See Admitting Provider Note  Report given to:   Additional Notes:

## 2023-04-07 NOTE — H&P (Signed)
History and Physical    Patient: Kylie Ward ZOX:[id number] DOB: [birth year] DOA: 04/07/2023 DOS: the patient was seen and examined on 04/07/2023 PCP: Bennie Pierini, FNP  Patient coming from: Home  Chief Complaint:  Chief Complaint  Patient presents with   Chest Pain       HPI:  Kylie Ward is a 77 y.o. F with DM, HTN, HLD, carotid disease, hx stroke, and anxiety who presented with 1 month back and left shoulder pain and now chest pain.  CP started three days ago, is associated with SOB and pain radiating down the arms.  Woke this morning with pain at rest, not relieved with nitro, and so husband called EMS who administered aspirin 325.  In the ER, ECG showed old LBBB with sinus tach, Troponin was slightly elevated, CXR showed pulmonary edema.  Started on heparin and hospitalists called.      Review of Systems  Constitutional:  Negative for chills, fever and malaise/fatigue.  Respiratory:  Positive for shortness of breath.   Cardiovascular:  Positive for chest pain. Negative for palpitations, orthopnea, claudication, leg swelling and PND.  Gastrointestinal:  Negative for abdominal pain and nausea.  Genitourinary:  Negative for dysuria, hematuria and urgency.  All other systems reviewed and are negative.    Past Medical History:  Diagnosis Date   Anxiety    Diabetes mellitus without complication (HCC)    Hyperlipidemia    Hypertension    Stroke [facility])    Past Surgical History:  Procedure Laterality Date   CAROTID ENDARTERECTOMY Right    CAROTID STENT     Social History:  reports that she quit smoking about 13 years ago. Her smoking use included cigarettes. She smoked an average of 1 pack per day. She has never used smokeless tobacco. She reports that she does not drink alcohol and does not use drugs.  Allergies  Allergen Reactions   Lipitor [Atorvastatin]     Joint pain / myalgias    Niaspan [Niacin Er]     Burn and itch all over    Family History   Problem Relation Age of Onset   Heart disease Father    Heart attack Father    Hypertension Father    Diabetes Sister    Stroke Sister    Diabetes Brother    Diabetes Sister    Diabetes Sister    Diabetes Brother    Diabetes Brother    Diabetes Brother     Prior to Admission medications   Medication Sig Start Date End Date Taking? Authorizing Provider  traZODone (DESYREL) 50 MG tablet Take 1 tablet (50 mg total) by mouth at bedtime. 02/28/23   Bennie Pierini, FNP  Accu-Chek FastClix Lancets MISC CHECK BLOOD SUGAR UP TO 3 TIMES A DAY AS DIRECTED Dx E11.42 02/15/22   Bennie Pierini, FNP  alendronate (FOSAMAX) 70 MG tablet Take 1 tablet (70 mg total) by mouth once a week. Take with a full glass of water on an empty stomach. 05/11/21   Daphine Deutscher, Mary-Margaret, FNP  ALPRAZolam Prudy Feeler) 1 MG tablet Take 1 tablet (1 mg total) by mouth 2 (two) times daily. 10/01/22   Daphine Deutscher Mary-Margaret, FNP  aspirin 81 MG tablet Take 81 mg by mouth daily.    [provider]  Blood Glucose Monitoring Suppl (ACCU-CHEK NANO SMARTVIEW) w/Device KIT CHECK BLOOD SUGER UP TO 3 TIMES A DAY 06/27/18   Daphine Deutscher, Mary-Margaret, FNP  Continuous Blood Gluc Receiver (FREESTYLE LIBRE 2 READER) DEVI Use to test blood sugar continuously.  DX: E11.65 06/24/22   Bennie Pierini, FNP  Continuous Blood Gluc Sensor (FREESTYLE LIBRE 2 SENSOR) MISC Use to test blood sugar continuously. Place new sensor on back of the arm every 14 days as directed.  DX: E11.65 06/24/22   Daphine Deutscher, Mary-Margaret, FNP  donepezil (ARICEPT) 10 MG tablet Take 1 tablet (10 mg total) by mouth at bedtime. 10/01/22   Daphine Deutscher, Mary-Margaret, FNP  Dulaglutide (TRULICITY) 4.5 MG/0.5ML SOPN Inject 4.5 mg as directed once a week. 01/04/23   Daphine Deutscher Mary-Margaret, FNP  Elastic Bandages & Supports (V-2 HIGH COMPRESSION HOSE) MISC 1 each by Does not apply route daily. 02/12/16   Daphine Deutscher, Mary-Margaret, FNP  escitalopram (LEXAPRO) 20 MG tablet Take 1 tablet  (20 mg total) by mouth daily. 10/01/22   Daphine Deutscher, Mary-Margaret, FNP  fenofibrate 160 MG tablet Take 1 tablet (160 mg total) by mouth daily. 10/01/22   Daphine Deutscher, Mary-Margaret, FNP  furosemide (LASIX) 40 MG tablet Take 1 tablet (40 mg total) by mouth daily. 10/01/22   Daphine Deutscher, Mary-Margaret, FNP  glucose blood (ACCU-CHEK SMARTVIEW) test strip CHECK BLOOD SUGAR UP TO 3 TIMES A DAY AS DIRECTED 07/10/21   Daphine Deutscher, Mary-Margaret, FNP  insulin glargine (LANTUS) 100 UNIT/ML injection Inject 0.65 mLs (65 Units total) into the skin at bedtime. 10/01/22 10/08/24  Daphine Deutscher Mary-Margaret, FNP  insulin lispro (HUMALOG) 100 UNIT/ML injection Sliding scale up to 12u TID 05/11/21   Daphine Deutscher, Mary-Margaret, FNP  Insulin Pen Needle (SURE COMFORT PEN NEEDLES) 31G X 8 MM MISC USE TO inject lantus ONCE daily 12/04/19   Daphine Deutscher, Mary-Margaret, FNP  lisinopril (ZESTRIL) 20 MG tablet Take 1 tablet (20 mg total) by mouth daily. 10/01/22   Daphine Deutscher Mary-Margaret, FNP  memantine (NAMENDA) 10 MG tablet TAKE ONE TABLET BY MOUTH TWICE DAILY 03/11/23   Daphine Deutscher, Mary-Margaret, FNP  metFORMIN (GLUCOPHAGE-XR) 750 MG 24 hr tablet Take 1 tablet (750 mg total) by mouth 2 (two) times daily with a meal. 10/01/22   Daphine Deutscher, Mary-Margaret, FNP  mirabegron ER (MYRBETRIQ) 25 MG TB24 tablet Take 1 tablet (25 mg total) by mouth daily. 10/01/22   Daphine Deutscher, Mary-Margaret, FNP  ondansetron (ZOFRAN) 4 MG tablet Take 1 tablet (4 mg total) by mouth every 8 (eight) hours as needed for nausea or vomiting. 06/24/21   Daryll Drown, NP  simvastatin (ZOCOR) 80 MG tablet Take 1 tablet (80 mg total) by mouth daily. 10/01/22   Bennie Pierini, FNP    Physical Exam: Vitals:   04/07/23 0837 04/07/23 0915 04/07/23 1232 04/07/23 1345  BP: (!) 160/87 (!) 161/93  136/76  Pulse: (!) 113 (!) 110  (!) 101  Resp: 17 19  (!) 24  Temp: 98.7 F (37.1 C)  98.6 F (37 C)   TempSrc: Oral  Oral   SpO2: 98% 98%  94%  Weight:      Height:       Elderly adult female, lying in bed,  appears tired, but appropriate and reactive to questions Oropharynx moist, no oral lesions, conjunctive normal, lids and lashes normal, no nasal forming, discharge, or epistaxis Tachycardic, regular, no murmurs, trace pretibial edema, no pitting, JVP not visible Respiratory rate seems elevated, lung sounds clear without rales or wheezes Abdomen soft without tenderness palpation or guarding, no ascites or distention Attention normal, affect appropriate, judgment and insight appear normal, memory is impaired, but at baseline Generalized weakness, in all 4 extremities but symmetric, speech fluent, face symmetric        Data Reviewed: Discussed with cardiology team Basic metabolic panel shows mild  nongap acidosis, hyperglycemia, renal function within range at baseline CBC shows mild leukocytosis, normal hemoglobin and platelets Troponin 339 BNP 870 Chest x-ray shows bilateral interstitial opacities, suggestive of pulmonary edema EKG shows old left bundle branch block      Assessment and Plan: Chest pain and elevated troponin Unclear the nature of this.  Somewhat atypical but possible NSTEMI. - Continue heparin infusion - Continue aspirin, statin - Obtain echo - Give IV Lasix for pulmonary edema - Consult cardiology, appreciate expertise   Sinus tachycardia and leukocytosis Suspected to be reactive to cardiac issues, but unsure.  There is no focal infectious symptoms at all.    The lung x-ray was interpreted as edema by Radiology. -Check procalcitonin - Check urinalysis and urine culture - Obtain blood cultures - Check TSH  Type 2 diabetes with hyperglycemia -Check hemoglobin A1c - Continue Lantus but reduce dose since she will be n.p.o. for cath - Sliding scale corrections  Dementia Anxiety Cognition appears at baseline - Continue donepezil, memantine - Continue home Xanax, as needed - Continue Lexapro and trazodone  Hypertension Peripheral vascular  disease Cerebrovascular disease Hyperlipidemia - Continue lisinopril - Hold oral Lasix in favor of IV Lasix, as above - Continue fenofibrate and statin        Advance Care Planning: FULL CODE, confirmed with family  Consults: Cardiology, called  Family Communication: Daughter and husband  Severity of Illness: The appropriate patient status for this patient is OBSERVATION. Observation status is judged to be reasonable and necessary in order to provide the required intensity of service to ensure the patient's safety. The patient's presenting symptoms, physical exam findings, and initial radiographic and laboratory data in the context of their medical condition is felt to place them at decreased risk for further clinical deterioration. Furthermore, it is anticipated that the patient will be medically stable for discharge from the hospital within 2 midnights of admission.   Author: Alberteen Sam, MD 04/07/2023 3:01 PM  For on call review [email or web address].

## 2023-04-07 NOTE — Progress Notes (Signed)
ANTICOAGULATION CONSULT NOTE  Pharmacy Consult for heparin Indication: chest pain/ACS  Allergies  Allergen Reactions   Lipitor [Atorvastatin]     Joint pain / myalgias    Niaspan [Niacin Er]     Burn and itch all over    Patient Measurements: Height: 5\' 4"  (162.6 cm) Weight: 73 kg (160 lb 15 oz) IBW/kg (Calculated) : 54.7 Heparin Dosing Weight: 69.8  Vital Signs: Temp: 98.7 F (37.1 C) (06/06 0837) Temp Source: Oral (06/06 0837) BP: 161/93 (06/06 0915) Pulse Rate: 110 (06/06 0915)  Labs: Recent Labs    04/07/23 0838  HGB 11.5*  HCT 34.7*  PLT 206  CREATININE 1.39*  TROPONINIHS 339*    Estimated Creatinine Clearance: 34.8 mL/min (A) (by C-G formula based on SCr of 1.39 mg/dL (H)).   Assessment: 77 yo female presents with cheat pain. Pharmacy consulted to dose heparin for ACS. No AC PTA.   Hgb 11.5, plt 206.   Goal of Therapy:  Heparin level 0.3-0.7 units/ml Monitor platelets by anticoagulation protocol: Yes   Plan:  Heparin IV bolus 4000 units x1  Heparin infusion 800 units/hr  Check 6 hr heparin level  Heparin level and CBC daily Monitor for s/sx of bleeding   Andreas Ohm, PharmD Pharmacy Resident  04/07/2023 11:36 AM

## 2023-04-07 NOTE — Plan of Care (Signed)
  Problem: Education: Goal: Knowledge of General Education information will improve Description: Including pain rating scale, medication(s)/side effects and non-pharmacologic comfort measures Outcome: Progressing   Problem: Health Behavior/Discharge Planning: Goal: Ability to manage health-related needs will improve Outcome: Progressing   Problem: Clinical Measurements: Goal: Respiratory complications will improve Outcome: Progressing   Problem: Clinical Measurements: Goal: Cardiovascular complication will be avoided Outcome: Progressing   Problem: Activity: Goal: Risk for activity intolerance will decrease Outcome: Progressing   Problem: Nutrition: Goal: Adequate nutrition will be maintained Outcome: Progressing   Problem: Coping: Goal: Level of anxiety will decrease Outcome: Progressing   Problem: Pain Managment: Goal: General experience of comfort will improve Outcome: Progressing   Problem: Safety: Goal: Ability to remain free from injury will improve Outcome: Progressing   Problem: Skin Integrity: Goal: Risk for impaired skin integrity will decrease Outcome: Progressing   

## 2023-04-07 NOTE — ED Notes (Signed)
Pt is c/o chest pain at this time. Chest pressure 2/10

## 2023-04-07 NOTE — Inpatient Diabetes Management (Signed)
Inpatient Diabetes Program Recommendations  AACE/ADA: New Consensus Statement on Inpatient Glycemic Control (2015)  Target Ranges:  Prepandial:   less than 140 mg/dL      Peak postprandial:   less than 180 mg/dL (1-2 hours)      Critically ill patients:  140 - 180 mg/dL   Lab Results  Component Value Date   HGBA1C 8.7 (H) 01/04/2023    Review of Glycemic Control  Latest Reference Range & Units 04/07/23 08:38  Glucose 70 - 99 mg/dL 782 (H)  (H): Data is abnormally high Diabetes history: Type 2 DM Outpatient Diabetes medications: Trulicity 4.5 mg qwk, Lantus 65 units QHS, Humalog 12 units TID, Metformin 750 mg BID Current orders for Inpatient glycemic control: none  Inpatient Diabetes Program Recommendations:    Given plan for admission, consider: -Semglee 25 units QHS -Novolog 0-9 units TID & HS  Thanks, Lujean Rave, MSN, RNC-OB Diabetes Coordinator [phone] (8a-5p)

## 2023-04-07 NOTE — ED Notes (Signed)
Got patient into a gown on the monitor did EKG shown to Dr Hyacinth Meeker patient is resting with call bell in reach

## 2023-04-07 NOTE — ED Provider Notes (Signed)
[place] EMERGENCY DEPARTMENT AT [facility] Provider Note   CSN: [id number] Arrival date & time: 04/07/23  8657     History  Chief Complaint  Patient presents with   Chest Pain    Kylie Ward is a 77 y.o. female with a history of dementia, stroke, diabetes mellitus, and hypertension who presents to the ED today for chest pain.  Patient's husband is at bedside. He reports that last night around 10 PM, patient started to have chest pain and back pain with associated shortness of breath.  Husband reports that she was complaining of pain radiating down her arms at that time.  Her husband took her blood pressure, which was elevated in the 150s systolically, and her heart rate which was about 120 bpm.  Patient was given one of her husband's nitroglycerin tablets, but it did not relieve her chest pain.  Patient reports that she had chest pain this morning when she woke up so her husband called the ambulance.  She currently denies chest pain or shortness of breath at the time of examination.  Husband reports that she has left-sided weakness as a result of her prior stroke.  No fevers, chills    Home Medications Prior to Admission medications   Medication Sig Start Date End Date Taking? Authorizing Provider  traZODone (DESYREL) 50 MG tablet Take 1 tablet (50 mg total) by mouth at bedtime. 02/28/23   Bennie Pierini, FNP  Accu-Chek FastClix Lancets MISC CHECK BLOOD SUGAR UP TO 3 TIMES A DAY AS DIRECTED Dx E11.42 02/15/22   Bennie Pierini, FNP  alendronate (FOSAMAX) 70 MG tablet Take 1 tablet (70 mg total) by mouth once a week. Take with a full glass of water on an empty stomach. 05/11/21   Daphine Deutscher, Mary-Margaret, FNP  ALPRAZolam Prudy Feeler) 1 MG tablet Take 1 tablet (1 mg total) by mouth 2 (two) times daily. 10/01/22   Daphine Deutscher Mary-Margaret, FNP  aspirin 81 MG tablet Take 81 mg by mouth daily.    [provider]  Blood Glucose Monitoring Suppl (ACCU-CHEK NANO SMARTVIEW)  w/Device KIT CHECK BLOOD SUGER UP TO 3 TIMES A DAY 06/27/18   Daphine Deutscher, Mary-Margaret, FNP  Continuous Blood Gluc Receiver (FREESTYLE LIBRE 2 READER) DEVI Use to test blood sugar continuously. DX: E11.65 06/24/22   Daphine Deutscher Mary-Margaret, FNP  Continuous Blood Gluc Sensor (FREESTYLE LIBRE 2 SENSOR) MISC Use to test blood sugar continuously. Place new sensor on back of the arm every 14 days as directed.  DX: E11.65 06/24/22   Daphine Deutscher, Mary-Margaret, FNP  donepezil (ARICEPT) 10 MG tablet Take 1 tablet (10 mg total) by mouth at bedtime. 10/01/22   Daphine Deutscher, Mary-Margaret, FNP  Dulaglutide (TRULICITY) 4.5 MG/0.5ML SOPN Inject 4.5 mg as directed once a week. 01/04/23   Daphine Deutscher Mary-Margaret, FNP  Elastic Bandages & Supports (V-2 HIGH COMPRESSION HOSE) MISC 1 each by Does not apply route daily. 02/12/16   Daphine Deutscher, Mary-Margaret, FNP  escitalopram (LEXAPRO) 20 MG tablet Take 1 tablet (20 mg total) by mouth daily. 10/01/22   Daphine Deutscher, Mary-Margaret, FNP  fenofibrate 160 MG tablet Take 1 tablet (160 mg total) by mouth daily. 10/01/22   Daphine Deutscher, Mary-Margaret, FNP  furosemide (LASIX) 40 MG tablet Take 1 tablet (40 mg total) by mouth daily. 10/01/22   Daphine Deutscher, Mary-Margaret, FNP  glucose blood (ACCU-CHEK SMARTVIEW) test strip CHECK BLOOD SUGAR UP TO 3 TIMES A DAY AS DIRECTED 07/10/21   Daphine Deutscher, Mary-Margaret, FNP  insulin glargine (LANTUS) 100 UNIT/ML injection Inject 0.65 mLs (65 Units total)  into the skin at bedtime. 10/01/22 10/08/24  Daphine Deutscher Mary-Margaret, FNP  insulin lispro (HUMALOG) 100 UNIT/ML injection Sliding scale up to 12u TID 05/11/21   Daphine Deutscher, Mary-Margaret, FNP  Insulin Pen Needle (SURE COMFORT PEN NEEDLES) 31G X 8 MM MISC USE TO inject lantus ONCE daily 12/04/19   Daphine Deutscher, Mary-Margaret, FNP  lisinopril (ZESTRIL) 20 MG tablet Take 1 tablet (20 mg total) by mouth daily. 10/01/22   Daphine Deutscher Mary-Margaret, FNP  memantine (NAMENDA) 10 MG tablet TAKE ONE TABLET BY MOUTH TWICE DAILY 03/11/23   Daphine Deutscher, Mary-Margaret, FNP  metFORMIN  (GLUCOPHAGE-XR) 750 MG 24 hr tablet Take 1 tablet (750 mg total) by mouth 2 (two) times daily with a meal. 10/01/22   Daphine Deutscher, Mary-Margaret, FNP  mirabegron ER (MYRBETRIQ) 25 MG TB24 tablet Take 1 tablet (25 mg total) by mouth daily. 10/01/22   Daphine Deutscher, Mary-Margaret, FNP  ondansetron (ZOFRAN) 4 MG tablet Take 1 tablet (4 mg total) by mouth every 8 (eight) hours as needed for nausea or vomiting. 06/24/21   Daryll Drown, NP  simvastatin (ZOCOR) 80 MG tablet Take 1 tablet (80 mg total) by mouth daily. 10/01/22   Bennie Pierini, FNP      Allergies    Lipitor [atorvastatin] and Niaspan [niacin er]    Review of Systems   Review of Systems  Cardiovascular:  Positive for chest pain.  All other systems reviewed and are negative.   Physical Exam Updated Vital Signs BP (!) 161/93   Pulse (!) 110   Temp 98.6 F (37 C) (Oral)   Resp 19   Ht 5\' 4"  (1.626 m)   Wt 73 kg   LMP 01/27/1983   SpO2 98%   BMI 27.62 kg/m  Physical Exam Vitals and nursing note reviewed.  Constitutional:      Appearance: Normal appearance. She is well-developed.  HENT:     Head: Normocephalic and atraumatic.     Mouth/Throat:     Mouth: Mucous membranes are moist.  Eyes:     Conjunctiva/sclera: Conjunctivae normal.     Pupils: Pupils are equal, round, and reactive to light.  Cardiovascular:     Rate and Rhythm: Regular rhythm. Tachycardia present.     Pulses: Normal pulses.     Heart sounds: Normal heart sounds.  Pulmonary:     Effort: Pulmonary effort is normal.     Breath sounds: Normal breath sounds.  Abdominal:     Palpations: Abdomen is soft.     Tenderness: There is no abdominal tenderness.  Musculoskeletal:        General: Tenderness present.     Comments: Tenderness to upper back between the scapulas.  Skin:    General: Skin is warm and dry.     Capillary Refill: Capillary refill takes 2 to 3 seconds.     Findings: No rash.  Neurological:     Mental Status: She is alert. Mental status  is at baseline.  Psychiatric:        Mood and Affect: Mood normal.        Behavior: Behavior normal.     ED Results / Procedures / Treatments   Labs (all labs ordered are listed, but only abnormal results are displayed) Labs Reviewed  BASIC METABOLIC PANEL - Abnormal; Notable for the following components:      Result Value   CO2 18 (*)    Glucose, Bld 315 (*)    Creatinine, Ser 1.39 (*)    Calcium 8.7 (*)    GFR, Estimated 40 (*)  All other components within normal limits  CBC - Abnormal; Notable for the following components:   WBC 14.4 (*)    RBC 3.73 (*)    Hemoglobin 11.5 (*)    HCT 34.7 (*)    All other components within normal limits  BRAIN NATRIURETIC PEPTIDE - Abnormal; Notable for the following components:   B Natriuretic Peptide 870.1 (*)    All other components within normal limits  TROPONIN I (HIGH SENSITIVITY) - Abnormal; Notable for the following components:   Troponin I (High Sensitivity) 339 (*)    All other components within normal limits  HEPARIN LEVEL (UNFRACTIONATED)  TROPONIN I (HIGH SENSITIVITY)    EKG EKG Interpretation  Date/Time:  Thursday April 07 2023 08:37:26 EDT Ventricular Rate:  111 PR Interval:  125 QRS Duration: 156 QT Interval:  353 QTC Calculation: 480 R Axis:   18 Text Interpretation: Sinus tachycardia Left bundle branch block No old tracing to compare Confirmed by Eber Hong ([id number]) on 04/07/2023 8:54:24 AM  Radiology CT Angio Chest/Abd/Pel for Dissection W and/or Wo Contrast  Result Date: 04/07/2023 CLINICAL DATA:  Three day history of intermittent chest pain associated with tachycardia EXAM: CT ANGIOGRAPHY CHEST, ABDOMEN AND PELVIS TECHNIQUE: Non-contrast CT of the chest was initially obtained. Multidetector CT imaging through the chest, abdomen and pelvis was performed using the standard protocol during bolus administration of intravenous contrast. Multiplanar reconstructed images and MIPs were obtained and reviewed to evaluate  the vascular anatomy. RADIATION DOSE REDUCTION: This exam was performed according to the departmental dose-optimization program which includes automated exposure control, adjustment of the mA and/or kV according to patient size and/or use of iterative reconstruction technique. CONTRAST:  OMNIPAQUE IOHEXOL 350 MG/ML SOLN COMPARISON:  Chest radiograph dated 04/07/2023 FINDINGS: CTA CHEST FINDINGS Cardiovascular: Preferential opacification of the thoracic aorta. No evidence of thoracic aortic aneurysm or dissection. Left atrial enlargement. No pericardial effusion. Anatomic variant common origin of the brachiocephalic and common carotid arteries. Severe stenosis of the proximal left subclavian artery due to atherosclerotic plaque. No pulmonary emboli to the level of the subsegmental arteries. Coronary artery calcifications. Mediastinum/Nodes: Imaged thyroid gland without nodules meeting criteria for imaging follow-up by size. Normal esophagus. No pathologically enlarged axillary, supraclavicular, mediastinal, or hilar lymph nodes. Lungs/Pleura: The central airways are patent. Mild bilateral interlobular septal thickening with bilateral perihilar peribronchovascular ground-glass densities. No pneumothorax. Trace bilateral pleural effusions, left-greater-than-right. Musculoskeletal: No acute or abnormal lytic or blastic osseous lesions. Review of the MIP images confirms the above findings. CTA ABDOMEN AND PELVIS FINDINGS VASCULAR Aorta: Aortic atherosclerosis. Normal caliber aorta without aneurysm, dissection, vasculitis or significant stenosis. Celiac: Patent without evidence of aneurysm, dissection, vasculitis or significant stenosis. SMA: Mild-to-moderate narrowing of the SMA origin due to atherosclerotic plaque. No evidence of aneurysm, dissection, or vasculitis. Renals: 2 right and 3 left renal arteries. Moderate narrowing of the dominant bilateral renal arteries due to atherosclerotic plaque. No evidence of  aneurysm, dissection, or vasculitis. IMA: Patent without evidence of aneurysm, dissection, vasculitis or significant stenosis. Inflow: Non opacification proximal common iliac arteries. Reconstitution immediately proximal to the bifurcation. Segmental mild luminal narrowing of the external and internal iliac arteries to atherosclerotic plaque. Proximal Outflow: Mild narrowing of the common femoral arteries due to atherosclerotic plaque. Visualized portions of the superficial and profunda femoral arteries are patent without evidence of aneurysm, dissection, vasculitis or significant stenosis. Veins: No obvious venous abnormality within the limitations of this arterial phase study. Review of the MIP images confirms the above findings. NON-VASCULAR Hepatobiliary:  No focal hepatic lesions. No intra or extrahepatic biliary ductal dilation. Normal gallbladder. Pancreas: No focal lesions or main ductal dilation. Spleen: Normal in size without focal abnormality. Adrenals/Urinary Tract: No adrenal nodules. No suspicious renal mass, calculi or hydronephrosis. No focal bladder wall thickening. Stomach/Bowel: Normal appearance of the stomach. No evidence of bowel wall thickening, distention, or inflammatory changes. Normal appendix. Lymphatic: No enlarged abdominal or pelvic lymph nodes. Reproductive: No adnexal masses. Other: No free fluid, fluid collection, or free air. Musculoskeletal: No acute or abnormal lytic or blastic osseous lesions. Review of the MIP images confirms the above findings. IMPRESSION: 1. No evidence of thoracic or abdominal aortic aneurysm or dissection. 2. Non opacification of the proximal common iliac arteries with reconstitution immediately proximal to the bifurcation. 3. Severe stenosis of the proximal left subclavian artery due to atherosclerotic plaque. 4. Mild bilateral interlobular septal thickening with bilateral perihilar peribronchovascular ground-glass densities, keeping with pulmonary edema.  5. Trace bilateral pleural effusions, left-greater-than-right. 6. No pulmonary emboli to the level of the subsegmental arteries. 7. No acute findings in the abdomen or pelvis. 8. Aortic Atherosclerosis (ICD10-I70.0). Coronary artery calcifications. Assessment for potential risk factor modification, dietary therapy or pharmacologic therapy may be warranted, if clinically indicated. Electronically Signed   By: Agustin Cree M.D.   On: 04/07/2023 12:04   DG Chest Port 1 View  Result Date: 04/07/2023 CLINICAL DATA:  Three day history of intermittent chest pain EXAM: PORTABLE CHEST 1 VIEW COMPARISON:  None Available. FINDINGS: Normal lung volumes. Prominent bilateral perihilar vasculature. No pleural effusion or pneumothorax. Enlarged cardiomediastinal silhouette is likely projectional. No acute osseous abnormality. IMPRESSION: 1. Prominent bilateral perihilar vasculature, which may be seen in the setting of pulmonary edema. 2. Normal radiographic appearance of the partially imaged left shoulder. Electronically Signed   By: Agustin Cree M.D.   On: 04/07/2023 09:29    Procedures .Critical Care  Performed by: Maxwell Marion, PA-C Authorized by: Maxwell Marion, PA-C   Critical care provider statement:    Critical care time (minutes):  33   Critical care was necessary to treat or prevent imminent or life-threatening deterioration of the following conditions:  Cardiac failure   Critical care was time spent personally by me on the following activities:  Development of treatment plan with patient or surrogate, discussions with consultants, evaluation of patient's response to treatment, examination of patient, ordering and review of laboratory studies, ordering and review of radiographic studies, ordering and performing treatments and interventions, pulse oximetry, re-evaluation of patient's condition, review of old charts and obtaining history from patient or surrogate   I assumed direction of critical care for this  patient from another provider in my specialty: yes      Medications Ordered in ED Medications  heparin ADULT infusion 100 units/mL (25000 units/266mL) (800 Units/hr Intravenous New Bag/Given 04/07/23 1159)  aspirin chewable tablet 324 mg (324 mg Oral Given 04/07/23 0924)  morphine (PF) 4 MG/ML injection 2 mg (2 mg Intravenous Given 04/07/23 0926)  iohexol (OMNIPAQUE) 350 MG/ML injection 100 mL (100 mLs Intravenous Contrast Given 04/07/23 1120)  heparin bolus via infusion 4,000 Units (4,000 Units Intravenous Bolus from Bag 04/07/23 1200)    ED Course/ Medical Decision Making/ A&P Clinical Course as of 04/07/23 1235  Thu Apr 07, 2023  1005 DG Chest [place] 1 View [HT]    Clinical Course User Index [HT] Maxwell Marion, PA-C  Medical Decision Making Amount and/or Complexity of Data Reviewed Labs: ordered. Radiology: ordered.   This patient presents to the ED for concern of chest pain, this involves an extensive number of treatment options, and is a complaint that carries with it a high risk of complications and morbidity.   Differential diagnosis includes: ACS, aortic dissection, PE, pneumothorax, pneumonia, pericarditis, pleural effusion, CHF. Pneumothorax, pneumonia, and pericarditis unlikely after reviewing CXR. Aortic dissection and PE unlikely after reviewing CTA.   Co morbidities that complicate the patient evaluation  Stroke Diabetes mellitus Hypertension Aspirin use   Additional history obtained:  Additional history obtained from patient's husband   Cardiac Monitoring / EKG:  The patient was maintained on a cardiac monitor.  I personally viewed and interpreted the cardiac monitored which showed: Sinus tachycardia and left bundle branch block with a heart rate of 111 bpm. Prior EKG from PCP several years ago also shows left bundle branch block.   Lab Tests:  I ordered and personally interpreted labs.  The pertinent results include:   Initial  troponin of 339. BNP of 870.1. No prior results to compare to. Elevated WBC on CBC otherwise within normal limits. Glucose of 315 and GFR of 40 on BMP otherwise within normal limits.    Imaging Studies ordered:  I ordered imaging studies including: CXR and CT angio chest/abdomen/pelvis I independently visualized and interpreted imaging which showed: CXR - prominent bilateral perihilar vasculature; no pleural effusion or pneumothorax CTA - no aortic dissection or pulmonary embolism present I agree with the radiologist interpretation   Consultations Obtained:  I requested consultation with hospitalist and cardiology,  and discussed lab and imaging findings as well as pertinent plan - they recommend: admit for observation   Problem List / ED Course / Critical interventions / Medication management  Chest pain I ordered medications including: Morphine for pain Heparin and Aspirin for NSTEMI Reevaluation of the patient after these medicines showed that the patient improved I have reviewed the patients home medicines and have made adjustments as needed   Social Determinants of Health:  Physical activity Social connection   Test / Admission - Considered:  Admitted for observation of NSTEMI        Final Clinical Impression(s) / ED Diagnoses Final diagnoses:  NSTEMI (non-ST elevated myocardial infarction) [facility])    Rx / DC Orders ED Discharge Orders     None         Maxwell Marion, PA-C 04/07/23 1235    Eber Hong, MD 04/07/23 1312

## 2023-04-07 NOTE — Interval H&P Note (Signed)
History and Physical Interval Note:  04/07/2023 5:03 PM  Kylie Ward  has presented today for surgery, with the diagnosis of nstemi.  The various methods of treatment have been discussed with the patient and family. After consideration of risks, benefits and other options for treatment, the patient has consented to  Procedure(s): LEFT HEART CATH AND CORONARY ANGIOGRAPHY (N/A) as a surgical intervention.  The patient's history has been reviewed, patient examined, no change in status, stable for surgery.  I have reviewed the patient's chart and labs.  Questions were answered to the patient's satisfaction.     Tonny Bollman

## 2023-04-07 NOTE — ED Triage Notes (Signed)
Back and left shoulder pain x 1 month. Reports intermittent chest pain  x 3 days that is worse today. LBB per EMS. 324mg  Aspirin given by EMS. CBG 315. Hx of anxiety and dementia.  EMS VS:  120/80 HR 90

## 2023-04-07 NOTE — Consult Note (Addendum)
 Cardiology Consultation   Patient ID: Kylie Ward MRN: [id number]; DOB: 06/28/1946  Admit date: 04/07/2023 Date of Consult: 04/07/2023  PCP:  Martin, Mary-Margaret, FNP   [facility] Providers Cardiologist:  New  Patient Profile:   Kylie Ward is a 77 y.o. female with a hx of dementia, HTN, HLD, DM, CVA, Carotid artery stenosis s/p left ICA stenting and angioplasty, right carotid artery endarterectomy who is being seen 04/07/2023 for the evaluation of elevated troponin at the request of Dr. Danford.  History of Present Illness:   Kylie Ward is a 77-year-old female with past medical history noted above.  She has not been evaluated by cardiology in the past.  She has been followed by her PCP on a regular basis.  She underwent left carotid artery stenting/angioplasty in 2011 and right carotid artery endarterectomy 2015.  She currently lives at home with her husband.  She is able to give some of her history, family does feel in additional details.  Reports she has been in her usual state of health up until last evening when she developed significant centralized chest pain with radiation through to her back as well as dyspnea.  Symptoms lingered throughout the evening into this morning.  Ultimately was brought in via EMS and given aspirin en route.  Labs in the ED showed sodium 135, potassium 5, creatinine 1.39, BNP 870, high-sensitivity troponin 339>> 786, WBC 14.4, hemoglobin 11.5.  CT angio chest with severe stenosis of proximal left subclavian artery, pulmonary edema, trace bilateral pleural effusions, no PE.  EKG shows sinus tachycardia, 111 bpm left bundle branch block.  She was admitted to internal medicine for further management.  Started on IV heparin, given IV Lasix.  Cardiology asked to evaluate.  Past Medical History:  Diagnosis Date   Anxiety    Diabetes mellitus without complication (HCC)    Hyperlipidemia    Hypertension    Stroke (HCC)     Past  Surgical History:  Procedure Laterality Date   CAROTID ENDARTERECTOMY Right    CAROTID STENT       Home Medications:  Prior to Admission medications   Medication Sig Start Date End Date Taking? Authorizing Provider  traZODone (DESYREL) 50 MG tablet Take 1 tablet (50 mg total) by mouth at bedtime. 02/28/23   Martin, Mary-Margaret, FNP  Accu-Chek FastClix Lancets MISC CHECK BLOOD SUGAR UP TO 3 TIMES A DAY AS DIRECTED Dx E11.42 02/15/22   Martin, Mary-Margaret, FNP  alendronate (FOSAMAX) 70 MG tablet Take 1 tablet (70 mg total) by mouth once a week. Take with a full glass of water on an empty stomach. 05/11/21   Martin, Mary-Margaret, FNP  ALPRAZolam (XANAX) 1 MG tablet Take 1 tablet (1 mg total) by mouth 2 (two) times daily. 10/01/22   Martin, Mary-Margaret, FNP  aspirin 81 MG tablet Take 81 mg by mouth daily.    [provider]  Blood Glucose Monitoring Suppl (ACCU-CHEK NANO SMARTVIEW) w/Device KIT CHECK BLOOD SUGER UP TO 3 TIMES A DAY 06/27/18   Martin, Mary-Margaret, FNP  Continuous Blood Gluc Receiver (FREESTYLE LIBRE 2 READER) DEVI Use to test blood sugar continuously. DX: E11.65 06/24/22   Martin, Mary-Margaret, FNP  Continuous Blood Gluc Sensor (FREESTYLE LIBRE 2 SENSOR) MISC Use to test blood sugar continuously. Place new sensor on back of the arm every 14 days as directed.  DX: E11.65 06/24/22   Martin, Mary-Margaret, FNP  donepezil (ARICEPT) 10 MG tablet Take 1 tablet (10 mg total) by mouth at   bedtime. 10/01/22   Martin, Mary-Margaret, FNP  Dulaglutide (TRULICITY) 4.5 MG/0.5ML SOPN Inject 4.5 mg as directed once a week. 01/04/23   Martin, Mary-Margaret, FNP  Elastic Bandages & Supports (V-2 HIGH COMPRESSION HOSE) MISC 1 each by Does not apply route daily. 02/12/16   Martin, Mary-Margaret, FNP  escitalopram (LEXAPRO) 20 MG tablet Take 1 tablet (20 mg total) by mouth daily. 10/01/22   Martin, Mary-Margaret, FNP  fenofibrate 160 MG tablet Take 1 tablet (160 mg total) by mouth daily. 10/01/22    Martin, Mary-Margaret, FNP  furosemide (LASIX) 40 MG tablet Take 1 tablet (40 mg total) by mouth daily. 10/01/22   Martin, Mary-Margaret, FNP  glucose blood (ACCU-CHEK SMARTVIEW) test strip CHECK BLOOD SUGAR UP TO 3 TIMES A DAY AS DIRECTED 07/10/21   Martin, Mary-Margaret, FNP  insulin glargine (LANTUS) 100 UNIT/ML injection Inject 0.65 mLs (65 Units total) into the skin at bedtime. 10/01/22 10/08/24  Martin, Mary-Margaret, FNP  insulin lispro (HUMALOG) 100 UNIT/ML injection Sliding scale up to 12u TID 05/11/21   Martin, Mary-Margaret, FNP  Insulin Pen Needle (SURE COMFORT PEN NEEDLES) 31G X 8 MM MISC USE TO inject lantus ONCE daily 12/04/19   Martin, Mary-Margaret, FNP  lisinopril (ZESTRIL) 20 MG tablet Take 1 tablet (20 mg total) by mouth daily. 10/01/22   Martin, Mary-Margaret, FNP  memantine (NAMENDA) 10 MG tablet TAKE ONE TABLET BY MOUTH TWICE DAILY 03/11/23   Martin, Mary-Margaret, FNP  metFORMIN (GLUCOPHAGE-XR) 750 MG 24 hr tablet Take 1 tablet (750 mg total) by mouth 2 (two) times daily with a meal. 10/01/22   Martin, Mary-Margaret, FNP  mirabegron ER (MYRBETRIQ) 25 MG TB24 tablet Take 1 tablet (25 mg total) by mouth daily. 10/01/22   Martin, Mary-Margaret, FNP  ondansetron (ZOFRAN) 4 MG tablet Take 1 tablet (4 mg total) by mouth every 8 (eight) hours as needed for nausea or vomiting. 06/24/21   Ijaola, Onyeje M, NP  simvastatin (ZOCOR) 80 MG tablet Take 1 tablet (80 mg total) by mouth daily. 10/01/22   Martin, Mary-Margaret, FNP    Inpatient Medications: Scheduled Meds:  furosemide  40 mg Intravenous BID   potassium chloride  20 mEq Oral Daily   Continuous Infusions:  heparin 800 Units/hr (04/07/23 1159)   PRN Meds:   Allergies:    Allergies  Allergen Reactions   Lipitor [Atorvastatin]     Joint pain / myalgias    Niaspan [Niacin Er]     Burn and itch all over    Social History:   Social History   Socioeconomic History   Marital status: Married    Spouse name: Cliff   Number of  children: 1   Years of education: 10   Highest education level: 10th grade  Occupational History   Occupation: retired    Employer: [employer]  Tobacco Use   Smoking status: Former    Packs/day: 1    Types: Cigarettes    Quit date: 12/02/2009    Years since quitting: 13.3   Smokeless tobacco: Never  Vaping Use   Vaping Use: Never used  Substance and Sexual Activity   Alcohol use: No   Drug use: No   Sexual activity: Yes    Birth control/protection: Post-menopausal  Other Topics Concern   Not on file  Social History Narrative   One level living with her husband   Social Determinants of Health   Financial Resource Strain: Low Risk  (05/10/2022)   Overall Financial Resource Strain (CARDIA)    Difficulty of   Paying Living Expenses: Not hard at all  Food Insecurity: No Food Insecurity (05/10/2022)   Hunger Vital Sign    Worried About Running Out of Food in the Last Year: Never true    Ran Out of Food in the Last Year: Never true  Transportation Needs: No Transportation Needs (05/10/2022)   PRAPARE - Transportation    Lack of Transportation (Medical): No    Lack of Transportation (Non-Medical): No  Physical Activity: Inactive (05/10/2022)   Exercise Vital Sign    Days of Exercise per Week: 0 days    Minutes of Exercise per Session: 0 min  Stress: No Stress Concern Present (05/10/2022)   [employer] - Occupational Stress Questionnaire    Feeling of Stress : Only a little  Social Connections: Socially Isolated (05/07/2021)   Social Connection and Isolation Panel [NHANES]    Frequency of Communication with Friends and Family: Once a week    Frequency of Social Gatherings with Friends and Family: Once a week    Attends Religious Services: Never    Active Member of Clubs or Organizations: No    Attends Club or Organization Meetings: Never    Marital Status: Married  Intimate Partner Violence: Not At Risk (05/07/2021)   Humiliation, Afraid, Rape, and Kick  questionnaire    Fear of Current or Ex-Partner: No    Emotionally Abused: No    Physically Abused: No    Sexually Abused: No    Family History:    Family History  Problem Relation Age of Onset   Heart disease Father    Heart attack Father    Hypertension Father    Diabetes Sister    Stroke Sister    Diabetes Brother    Diabetes Sister    Diabetes Sister    Diabetes Brother    Diabetes Brother    Diabetes Brother      ROS:  Please see the history of present illness.   All other ROS reviewed and negative.     Physical Exam/Data:   Vitals:   04/07/23 1445 04/07/23 1500 04/07/23 1505 04/07/23 1521  BP: 135/71 (!) 142/80    Pulse: (!) 106 (!) 109    Resp: (!) 25 (!) 23  (!) 23  Temp:   98 F (36.7 C) 98.4 F (36.9 C)  TempSrc:   Oral Oral  SpO2: 94% 95%    Weight:      Height:        Intake/Output Summary (Last 24 hours) at 04/07/2023 1522 Last data filed at 04/07/2023 1416 Gross per 24 hour  Intake --  Output 360 ml  Net -360 ml      04/07/2023    8:36 AM 01/04/2023    2:52 PM 10/01/2022    3:07 PM  Last 3 Weights  Weight (lbs) 160 lb 15 oz 163 lb 162 lb  Weight (kg) 73 kg 73.936 kg 73.483 kg     Body mass index is 27.62 kg/m.  General:  Older female, no distress HEENT: normal Neck: no JVD Vascular: No carotid bruits; Distal pulses 2+ bilaterally Cardiac:  normal S1, S2; RRR; no murmur  Lungs:  Diminished in bases   Abd: soft, nontender, no hepatomegaly  Ext: 1-2+ bilateral LE edema Musculoskeletal:  No deformities, BUE and BLE strength normal and equal Skin: warm and dry  Neuro:  CNs 2-12 intact, no focal abnormalities noted Psych:  Normal affect   EKG:  The EKG was personally reviewed and demonstrates:    Sinus tachycardia, 111bpm LBBB  Relevant CV Studies:  N/a   Laboratory Data:  High Sensitivity Troponin:   Recent Labs  Lab 04/07/23 0838 04/07/23 1136  TROPONINIHS 339* 786*     Chemistry Recent Labs  Lab 04/07/23 0838  NA 135  K 5.0   CL 104  CO2 18*  GLUCOSE 315*  BUN 22  CREATININE 1.39*  CALCIUM 8.7*  GFRNONAA 40*  ANIONGAP 13    No results for input(s): "PROT", "ALBUMIN", "AST", "ALT", "ALKPHOS", "BILITOT" in the last 168 hours. Lipids No results for input(s): "CHOL", "TRIG", "HDL", "LABVLDL", "LDLCALC", "CHOLHDL" in the last 168 hours.  Hematology Recent Labs  Lab 04/07/23 0838  WBC 14.4*  RBC 3.73*  HGB 11.5*  HCT 34.7*  MCV 93.0  MCH 30.8  MCHC 33.1  RDW 13.5  PLT 206   Thyroid No results for input(s): "TSH", "FREET4" in the last 168 hours.  BNP Recent Labs  Lab 04/07/23 0838  BNP 870.1*    DDimer No results for input(s): "DDIMER" in the last 168 hours.   Radiology/Studies:  CT Angio Chest/Abd/Pel for Dissection W and/or Wo Contrast  Result Date: 04/07/2023 CLINICAL DATA:  Three day history of intermittent chest pain associated with tachycardia EXAM: CT ANGIOGRAPHY CHEST, ABDOMEN AND PELVIS TECHNIQUE: Non-contrast CT of the chest was initially obtained. Multidetector CT imaging through the chest, abdomen and pelvis was performed using the standard protocol during bolus administration of intravenous contrast. Multiplanar reconstructed images and MIPs were obtained and reviewed to evaluate the vascular anatomy. RADIATION DOSE REDUCTION: This exam was performed according to the departmental dose-optimization program which includes automated exposure control, adjustment of the mA and/or kV according to patient size and/or use of iterative reconstruction technique. CONTRAST:  100mL OMNIPAQUE IOHEXOL 350 MG/ML SOLN COMPARISON:  Chest radiograph dated 04/07/2023 FINDINGS: CTA CHEST FINDINGS Cardiovascular: Preferential opacification of the thoracic aorta. No evidence of thoracic aortic aneurysm or dissection. Left atrial enlargement. No pericardial effusion. Anatomic variant common origin of the brachiocephalic and common carotid arteries. Severe stenosis of the proximal left subclavian artery due to  atherosclerotic plaque. No pulmonary emboli to the level of the subsegmental arteries. Coronary artery calcifications. Mediastinum/Nodes: Imaged thyroid gland without nodules meeting criteria for imaging follow-up by size. Normal esophagus. No pathologically enlarged axillary, supraclavicular, mediastinal, or hilar lymph nodes. Lungs/Pleura: The central airways are patent. Mild bilateral interlobular septal thickening with bilateral perihilar peribronchovascular ground-glass densities. No pneumothorax. Trace bilateral pleural effusions, left-greater-than-right. Musculoskeletal: No acute or abnormal lytic or blastic osseous lesions. Review of the MIP images confirms the above findings. CTA ABDOMEN AND PELVIS FINDINGS VASCULAR Aorta: Aortic atherosclerosis. Normal caliber aorta without aneurysm, dissection, vasculitis or significant stenosis. Celiac: Patent without evidence of aneurysm, dissection, vasculitis or significant stenosis. SMA: Mild-to-moderate narrowing of the SMA origin due to atherosclerotic plaque. No evidence of aneurysm, dissection, or vasculitis. Renals: 2 right and 3 left renal arteries. Moderate narrowing of the dominant bilateral renal arteries due to atherosclerotic plaque. No evidence of aneurysm, dissection, or vasculitis. IMA: Patent without evidence of aneurysm, dissection, vasculitis or significant stenosis. Inflow: Non opacification proximal common iliac arteries. Reconstitution immediately proximal to the bifurcation. Segmental mild luminal narrowing of the external and internal iliac arteries to atherosclerotic plaque. Proximal Outflow: Mild narrowing of the common femoral arteries due to atherosclerotic plaque. Visualized portions of the superficial and profunda femoral arteries are patent without evidence of aneurysm, dissection, vasculitis or significant stenosis. Veins: No obvious venous abnormality within the limitations of this arterial phase study.   Review of the MIP images confirms  the above findings. NON-VASCULAR Hepatobiliary: No focal hepatic lesions. No intra or extrahepatic biliary ductal dilation. Normal gallbladder. Pancreas: No focal lesions or main ductal dilation. Spleen: Normal in size without focal abnormality. Adrenals/Urinary Tract: No adrenal nodules. No suspicious renal mass, calculi or hydronephrosis. No focal bladder wall thickening. Stomach/Bowel: Normal appearance of the stomach. No evidence of bowel wall thickening, distention, or inflammatory changes. Normal appendix. Lymphatic: No enlarged abdominal or pelvic lymph nodes. Reproductive: No adnexal masses. Other: No free fluid, fluid collection, or free air. Musculoskeletal: No acute or abnormal lytic or blastic osseous lesions. Review of the MIP images confirms the above findings. IMPRESSION: 1. No evidence of thoracic or abdominal aortic aneurysm or dissection. 2. Non opacification of the proximal common iliac arteries with reconstitution immediately proximal to the bifurcation. 3. Severe stenosis of the proximal left subclavian artery due to atherosclerotic plaque. 4. Mild bilateral interlobular septal thickening with bilateral perihilar peribronchovascular ground-glass densities, keeping with pulmonary edema. 5. Trace bilateral pleural effusions, left-greater-than-right. 6. No pulmonary emboli to the level of the subsegmental arteries. 7. No acute findings in the abdomen or pelvis. 8. Aortic Atherosclerosis (ICD10-I70.0). Coronary artery calcifications. Assessment for potential risk factor modification, dietary therapy or pharmacologic therapy may be warranted, if clinically indicated. Electronically Signed   By: Limin  Xu M.D.   On: 04/07/2023 12:04   DG Chest Port 1 View  Result Date: 04/07/2023 CLINICAL DATA:  Three day history of intermittent chest pain EXAM: PORTABLE CHEST 1 VIEW COMPARISON:  None Available. FINDINGS: Normal lung volumes. Prominent bilateral perihilar vasculature. No pleural effusion or  pneumothorax. Enlarged cardiomediastinal silhouette is likely projectional. No acute osseous abnormality. IMPRESSION: 1. Prominent bilateral perihilar vasculature, which may be seen in the setting of pulmonary edema. 2. Normal radiographic appearance of the partially imaged left shoulder. Electronically Signed   By: Limin  Xu M.D.   On: 04/07/2023 09:29     Assessment and Plan:   Kylie Ward is a 77 y.o. female with a hx of dementia, HTN, HLD, DM, CVA, Carotid artery stenosis who is being seen 04/07/2023 for the evaluation of elevated troponin at the request of Dr. Danford.  NSTEMI -- developed centralized chest pain that started last night, with associated shortness of breath with radiation into her back and shoulders.  Symptoms lingered for several hours, ultimately brought in via EMS.  High-sensitivity troponin 339>>786.  EKG showed sinus tachycardia, 111 bpm, left bundle branch block.  Reports significant improvement in her symptoms since being started on IV heparin. -- Continue IV heparin, add aspirin, statin and Metoprolol 25 mg twice daily -- Plan for cardiac catheterization today -- Check Echocardiogram  Acute CHF Bilateral pleural effusions -- BNP 870, CT angio with pulmonary edema, bilateral pleural effusions -- Currently on IV Lasix 40 mg twice daily -- check echo as above  HTN -- Blood pressures elevated in the ED -- As above add metoprolol 25 mg twice daily  Hyperlipidemia -- On simvastatin 80 mg, fenofibrate 160 mg daily PTA -- Check lipids in a.m.  Diabetes -- SSI while inpatient -- Check hemoglobin A1c  Leukocytosis -- Denies any cough congestion, nausea vomiting or urinary symptoms prior to admission.  Suspect could be reactive -- Afebrile, WBC 14.4 -- Procalcitonin pending -- per primary  PAD Carotid artery disease Subclavian stenosis -- CT chest shows severe stenosis of proximal left subclavian artery -- will need VVS follow up  AKI vs CKD? -- Cr  1.39, down from   previous 1.51 (12/2022)  Risk Assessment/Risk Scores:   TIMI Risk Score for Unstable Angina or Non-ST Elevation MI:   The patient's TIMI risk score is 5, which indicates a 26% risk of all cause mortality, new or recurrent myocardial infarction or need for urgent revascularization in the next 14 days.  New York Heart Association (NYHA) Functional Class NYHA Class II   For questions or updates, please contact [facility] Please consult [email or web address] for contact info under    Signed, Lindsay Roberts, NP  04/07/2023 3:22 PM   Attending Note:   The patient was seen and examined.  Agree with assessment and plan as noted above.  Changes made to the above note as needed.  Patient seen and independently examined with Lindsay Roberts, NP .   We discussed all aspects of the encounter. I agree with the assessment and plan as stated above.   Unstable angina/acute coronary syndrome: Kylie Ward presents with symptoms consistent with an acute coronary syndrome.  She developed severe chest pain with radiation through to her intrascapular region.  This was associated with some shortness of breath and sweats.  She presented to the ER.  Her troponin levels are elevated.  ECG shows sinus tachycardia with a left bundle branch block.  Left bundle branch block was present at least as far back as July, 2022.   I discussed heart catheterization with her.  We discussed the risk, benefits, options of heart catheterization.  She understands and agrees to proceed.  Discussed also with her husband who has had heart catheterizations before.  Her daughter was also present in room.   3.  Left bundle branch block: Echocardiogram has been ordered.   I have spent a total of 40 minutes with patient reviewing hospital  notes , telemetry, EKGs, labs and examining patient as well as establishing an assessment and plan that was discussed with the patient.  > 50% of time was spent in direct patient  care.    Kylie Ward, Jr., MD, FACC 04/07/2023, 3:33 PM [address],  [address] Office - [phone] Pager [phone]   

## 2023-04-08 ENCOUNTER — Observation Stay (HOSPITAL_COMMUNITY): Payer: Medicare Other

## 2023-04-08 ENCOUNTER — Encounter (HOSPITAL_COMMUNITY): Payer: Self-pay | Admitting: Cardiovascular Disease

## 2023-04-08 DIAGNOSIS — I200 Unstable angina: Secondary | ICD-10-CM | POA: Diagnosis not present

## 2023-04-08 DIAGNOSIS — E1165 Type 2 diabetes mellitus with hyperglycemia: Secondary | ICD-10-CM | POA: Diagnosis present

## 2023-04-08 DIAGNOSIS — N179 Acute kidney failure, unspecified: Secondary | ICD-10-CM | POA: Diagnosis present

## 2023-04-08 DIAGNOSIS — Z7983 Long term (current) use of bisphosphonates: Secondary | ICD-10-CM | POA: Diagnosis not present

## 2023-04-08 DIAGNOSIS — R079 Chest pain, unspecified: Secondary | ICD-10-CM | POA: Diagnosis not present

## 2023-04-08 DIAGNOSIS — I447 Left bundle-branch block, unspecified: Secondary | ICD-10-CM | POA: Diagnosis present

## 2023-04-08 DIAGNOSIS — F411 Generalized anxiety disorder: Secondary | ICD-10-CM | POA: Diagnosis present

## 2023-04-08 DIAGNOSIS — I5021 Acute systolic (congestive) heart failure: Secondary | ICD-10-CM | POA: Diagnosis present

## 2023-04-08 DIAGNOSIS — I255 Ischemic cardiomyopathy: Secondary | ICD-10-CM | POA: Diagnosis present

## 2023-04-08 DIAGNOSIS — D72828 Other elevated white blood cell count: Secondary | ICD-10-CM | POA: Diagnosis present

## 2023-04-08 DIAGNOSIS — I214 Non-ST elevation (NSTEMI) myocardial infarction: Secondary | ICD-10-CM | POA: Diagnosis present

## 2023-04-08 DIAGNOSIS — Z823 Family history of stroke: Secondary | ICD-10-CM | POA: Diagnosis not present

## 2023-04-08 DIAGNOSIS — I110 Hypertensive heart disease with heart failure: Secondary | ICD-10-CM | POA: Diagnosis present

## 2023-04-08 DIAGNOSIS — Z7984 Long term (current) use of oral hypoglycemic drugs: Secondary | ICD-10-CM | POA: Diagnosis not present

## 2023-04-08 DIAGNOSIS — Z79899 Other long term (current) drug therapy: Secondary | ICD-10-CM | POA: Diagnosis not present

## 2023-04-08 DIAGNOSIS — E1151 Type 2 diabetes mellitus with diabetic peripheral angiopathy without gangrene: Secondary | ICD-10-CM | POA: Diagnosis present

## 2023-04-08 DIAGNOSIS — Z87891 Personal history of nicotine dependence: Secondary | ICD-10-CM | POA: Diagnosis not present

## 2023-04-08 DIAGNOSIS — E872 Acidosis, unspecified: Secondary | ICD-10-CM | POA: Diagnosis present

## 2023-04-08 DIAGNOSIS — Z8249 Family history of ischemic heart disease and other diseases of the circulatory system: Secondary | ICD-10-CM | POA: Diagnosis not present

## 2023-04-08 DIAGNOSIS — F0394 Unspecified dementia, unspecified severity, with anxiety: Secondary | ICD-10-CM | POA: Diagnosis present

## 2023-04-08 DIAGNOSIS — Z7982 Long term (current) use of aspirin: Secondary | ICD-10-CM | POA: Diagnosis not present

## 2023-04-08 DIAGNOSIS — I25110 Atherosclerotic heart disease of native coronary artery with unstable angina pectoris: Secondary | ICD-10-CM | POA: Diagnosis not present

## 2023-04-08 DIAGNOSIS — E782 Mixed hyperlipidemia: Secondary | ICD-10-CM | POA: Diagnosis present

## 2023-04-08 DIAGNOSIS — Z794 Long term (current) use of insulin: Secondary | ICD-10-CM | POA: Diagnosis not present

## 2023-04-08 DIAGNOSIS — E1142 Type 2 diabetes mellitus with diabetic polyneuropathy: Secondary | ICD-10-CM | POA: Diagnosis present

## 2023-04-08 DIAGNOSIS — I69354 Hemiplegia and hemiparesis following cerebral infarction affecting left non-dominant side: Secondary | ICD-10-CM | POA: Diagnosis not present

## 2023-04-08 DIAGNOSIS — I708 Atherosclerosis of other arteries: Secondary | ICD-10-CM | POA: Diagnosis present

## 2023-04-08 LAB — PROCALCITONIN: Procalcitonin: <0.1 ng/mL

## 2023-04-08 LAB — CULTURE, BLOOD (ROUTINE X 2)
Culture: NO GROWTH
Culture: NO GROWTH

## 2023-04-08 LAB — BASIC METABOLIC PANEL
Anion gap: 12 (ref 5–15)
BUN: 21 mg/dL (ref 8–23)
CO2: 23 mmol/L (ref 22–32)
Calcium: 8.9 mg/dL (ref 8.9–10.3)
Chloride: 100 mmol/L (ref 98–111)
Creatinine, Ser: 1.39 mg/dL — ABNORMAL HIGH (ref 0.44–1.00)
GFR, Estimated: 40 mL/min — ABNORMAL LOW (ref >60)
Glucose, Bld: 183 mg/dL — ABNORMAL HIGH (ref 70–99)
Potassium: 3.7 mmol/L (ref 3.5–5.1)
Sodium: 135 mmol/L (ref 135–145)

## 2023-04-08 LAB — CBC
HCT: 32.9 % — ABNORMAL LOW (ref 36.0–46.0)
Hemoglobin: 10.8 g/dL — ABNORMAL LOW (ref 12.0–15.0)
MCH: 29.6 pg (ref 26.0–34.0)
MCHC: 32.8 g/dL (ref 30.0–36.0)
MCV: 90.1 fL (ref 80.0–100.0)
Platelets: 212 10*3/uL (ref 150–400)
RBC: 3.65 MIL/uL — ABNORMAL LOW (ref 3.87–5.11)
RDW: 13.6 % (ref 11.5–15.5)
WBC: 11.5 10*3/uL — ABNORMAL HIGH (ref 4.0–10.5)
nRBC: 0 % (ref 0.0–0.2)

## 2023-04-08 LAB — ECHOCARDIOGRAM COMPLETE
Area-P 1/2: 4.49 cm2
Calc EF: 19.3 %
Height: 64 in
MV VTI: 1.17 cm2
S' Lateral: 4.7 cm
Single Plane A2C EF: 16.6 %
Single Plane A4C EF: 23.1 %
Weight: 2526.4 oz

## 2023-04-08 LAB — MAGNESIUM: Magnesium: 1.8 mg/dL (ref 1.7–2.4)

## 2023-04-08 LAB — GLUCOSE, CAPILLARY
Glucose-Capillary: 157 mg/dL — ABNORMAL HIGH (ref 70–99)
Glucose-Capillary: 164 mg/dL — ABNORMAL HIGH (ref 70–99)
Glucose-Capillary: 190 mg/dL — ABNORMAL HIGH (ref 70–99)
Glucose-Capillary: 205 mg/dL — ABNORMAL HIGH (ref 70–99)
Glucose-Capillary: 209 mg/dL — ABNORMAL HIGH (ref 70–99)
Glucose-Capillary: 249 mg/dL — ABNORMAL HIGH (ref 70–99)

## 2023-04-08 MED ORDER — LISINOPRIL 10 MG PO TABS
10.0000 mg | ORAL_TABLET | Freq: Every day | ORAL | Status: DC
  Administered 2023-04-08 – 2023-04-09 (×2): 10 mg via ORAL
  Filled 2023-04-08 (×2): qty 1

## 2023-04-08 MED ORDER — METOPROLOL TARTRATE 12.5 MG HALF TABLET
12.5000 mg | ORAL_TABLET | Freq: Two times a day (BID) | ORAL | Status: DC
  Administered 2023-04-08 (×2): 12.5 mg via ORAL
  Filled 2023-04-08 (×3): qty 1

## 2023-04-08 MED ORDER — ENOXAPARIN SODIUM 40 MG/0.4ML IJ SOSY
40.0000 mg | PREFILLED_SYRINGE | INTRAMUSCULAR | Status: DC
  Administered 2023-04-08 – 2023-04-09 (×2): 40 mg via SUBCUTANEOUS
  Filled 2023-04-08 (×2): qty 0.4

## 2023-04-08 MED ORDER — FUROSEMIDE 40 MG PO TABS
40.0000 mg | ORAL_TABLET | Freq: Every day | ORAL | Status: DC
  Administered 2023-04-09: 40 mg via ORAL
  Filled 2023-04-08: qty 1

## 2023-04-08 MED ORDER — CLOPIDOGREL BISULFATE 75 MG PO TABS
75.0000 mg | ORAL_TABLET | Freq: Every day | ORAL | Status: DC
  Administered 2023-04-08 – 2023-04-09 (×2): 75 mg via ORAL
  Filled 2023-04-08 (×2): qty 1

## 2023-04-08 MED ORDER — PERFLUTREN LIPID MICROSPHERE
1.0000 mL | INTRAVENOUS | Status: AC | PRN
  Administered 2023-04-08: 2 mL via INTRAVENOUS

## 2023-04-08 NOTE — Progress Notes (Signed)
PROGRESS NOTE Kylie Ward  WGN:[id number] DOB: Feb 08, 1946 DOA: 04/07/2023 PCP: Bennie Pierini, FNP  Brief Narrative/Hospital Course: 77 y.o.f with DM, HTN, HLD, carotid disease, hx stroke, and anxiety who presented with 1 month back and left shoulder pain and now chest pain that started three days PTA, is associated with shortness of breath and pain radiating down the arms.Woke  6/6 AM ith pain at rest, not relieved with nitro, and so husband called EMS who administered aspirin 325. In the ER, ECG showed old LBBB with sinus tach, Troponin was slightly elevated, CXR showed pulmonary edema.  Started on heparin and hospitalists called and admission was requested.  Labs with blood sugar 315 creatinine 1.39, troponin 339> 786, BNP 870 procalcitonin 0.14, leukocytosis 14.4 UA with WBC 6-10 small  LA and nitrate positive CT angio chest negative for aneurysm/dissection, severe stenosis of proximal left subclavian artery due to atherosclerotic plaque, Non opacification of the proximal common iliac arteries with reconstitution immediately proximal to the bifurcation, no PE, aortic atherosclerosis, evidence of pulmonary edema.  Seen by cardiology 6/6: Cardiac cath showed severe calcific multivessel coronary artery disease including left main disease, total occlusion of the RCA and severe restenosis in the LAD and circumflex vessel> not a candidate for CABG and advised medical therapy and CHF management.   Subjective: Patient seen and examined this morning. Husband at the bedside Currently denies any chest pain BP soft side in 90s noted transient hypotension 76/54 yesterday 5 PM  Assessment and Plan: Principal Problem:   Chest pain Active Problems:   Hyperlipidemia with target LDL less than 100   Essential hypertension, benign   GAD (generalized anxiety disorder)   Late effect of cerebrovascular accident (CVA)   Type 2 diabetes mellitus with diabetic polyneuropathy, with long-term current use  of insulin (HCC)   Dementia without behavioral disturbance (HCC)   NSTEMI CAD with severe calcific multivessel disease: s/p LHC 6/6- showing severe calcific multivessel disease with- left main disease, total occlusion of the RCA and severe restenosis in the LAD and circumflex vessel> not a candidate for CABG and advised medical therapy and CHF management. Appreciate cardiology input on board continue aspirin 81, Lipitor 40, fibrate 160, lisinopril 20 mg.  Acute systolic CHF Bilateral pleural effusions: Due to NSTEMI and CAD continue IV Lasix, GDMT per cardio. Cont to monitor daily I/O,weight, electrolytes and net balance as below.Keep on  salt/fluid restricted diet and monitor in tele. Net IO Since Admission: -791.94 mL [04/08/23 0931]  Filed Weights   04/07/23 0836 04/07/23 1522 04/08/23 0401  Weight: 73 kg 74.3 kg 71.6 kg   Recent Labs  Lab 04/07/23 0838  BNP 870.1*  BUN 22  CREATININE 1.39*  K 5.0    Hypertension:BP is elevated in the ED. Continue meds per GDMT.  Diabetes mellitus uncontrolled HbA1c at 9.4 with uncontrolled hyperglycemia in 300, continue Semglee 30 units, SSI, cbg improving Recent Labs  Lab 04/07/23 1546 04/07/23 2003 04/08/23 0002 04/08/23 0411 04/08/23 0655  GLUCAP  --  300* 249* 190* 164*  HGBA1C 9.5*  --   --   --   --     Hyperlipidemia: ldl 182, uncontrolled. Started on Lipitor 40, fenofibrate> will likely need to intensify the regimen,await lP (a)  Leukocytosis likely in the setting of MI procalcitonin negative, does have abnormal UA, monitor CBC.  PAD Carotid artery disease Subclavian stenosis: Patient appears to be a vasculopath CT chest/abd shows multiple findings,continue aspirin and statin.  AKI versus CKD: previous creatinine 1.5 in March  2024 creatinine 1.3 on admission,follow-up labs. Recent Labs    06/22/22 1412 10/01/22 1452 01/04/23 1506 04/07/23 0838  BUN 22 15 19 22   CREATININE 1.40* 1.01* 1.51* 1.39*  CO2 22 21 15* 18*     Overweight:Body mass index is 27.1 kg/m.:Will benefit with PCP follow-up,weight loss,healthy lifestyle.  Deconditioning/Debility:request PT/OT.   DVT prophylaxis: enoxaparin (LOVENOX) injection 40 mg Start: 04/08/23 1030 Code Status:   Code Status: Full Code Family Communication: plan of care discussed with patient/husband at bedside.  Patient status is: inpatient  because of NSTEMI Level of care: Telemetry Cardiac   Dispo:The patient is from: Home             Anticipated disposition: home 1-2 once cleared by cardiology  Objective: Vitals last 24 hrs: Vitals:   04/07/23 2104 04/07/23 2341 04/08/23 0401 04/08/23 0744  BP: 128/86 103/68 100/61 92/64  Pulse:  99 98 95  Resp:  18 18 18   Temp:  97.9 F (36.6 C) 98.2 F (36.8 C)   TempSrc:  Oral Oral   SpO2:  100% 98% 94%  Weight:   71.6 kg   Height:      Weight change:   Physical Examination: General exam:Alert awake,older than stated age. HEENT:Oral mucosa moist, Ear/Nose WNL grossly. Respiratory system:Bilaterally clear BS,no use of accessory muscle. Cardiovascular system: S1 & S2 +, No JVD. Gastrointestinal system: Abdomen soft,NT,ND, BS+. Nervous System:Alert, awake, moving extremities. Extremities: LE edema tight,distal peripheral pulses palpable.  Skin: No rashes,no icterus. MSK: Normal muscle bulk,tone, power.  Medications reviewed:  Scheduled Meds:  [START ON 04/09/2023] aspirin EC  81 mg Oral Daily   atorvastatin  40 mg Oral Daily   donepezil  10 mg Oral QHS   enoxaparin (LOVENOX) injection  40 mg Subcutaneous Q24H   escitalopram  20 mg Oral Daily   fenofibrate  160 mg Oral Daily   furosemide  40 mg Intravenous BID   insulin aspart  0-15 Units Subcutaneous Q4H   insulin glargine-yfgn  30 Units Subcutaneous QHS   lisinopril  20 mg Oral Daily   memantine  10 mg Oral BID   mirabegron ER  25 mg Oral Daily   potassium chloride  20 mEq Oral Daily   sodium chloride flush  3 mL Intravenous Q12H   sodium chloride  flush  3 mL Intravenous Q12H   traZODone  50 mg Oral QHS  Continuous Infusions:  sodium chloride      Diet Order             Diet Carb Modified Fluid consistency: Thin; Room service appropriate? Yes  Diet effective now                  Intake/Output Summary (Last 24 hours) at 04/08/2023 0931 Last data filed at 04/08/2023 0413 Gross per 24 hour  Intake 318.06 ml  Output 1110 ml  Net -791.94 ml   Net IO Since Admission: -791.94 mL [04/08/23 0931]  Wt Readings from Last 3 Encounters:  04/08/23 71.6 kg  01/04/23 73.9 kg  10/01/22 73.5 kg     Unresulted Labs (From admission, onward)     Start     Ordered   04/08/23 0500  CBC  Daily,   R      04/07/23 1142   04/08/23 0500  Basic metabolic panel  Tomorrow morning,   R        04/07/23 1533   04/08/23 0500  Procalcitonin  Tomorrow morning,   R  References:    Procalcitonin Lower Respiratory Tract Infection AND Sepsis Procalcitonin Algorithm   04/07/23 1507   04/08/23 0500  Lipoprotein A (LPA)  Tomorrow morning,   R        04/07/23 1919   04/08/23 0500  Magnesium  Once,   R       Question:  Specimen collection method  Answer:  Lab=Lab collect   04/07/23 2024   04/07/23 1507  Culture, blood (Routine X 2) w Reflex to ID Panel  BLOOD CULTURE X 2,   R      04/07/23 1507          Data Reviewed: I have personally reviewed following labs and imaging studies Recent Labs  Lab 04/07/23 0838  WBC 14.4*  HGB 11.5*  HCT 34.7*  MCV 93.0  PLT 206   Recent Labs  Lab 04/07/23 0838  NA 135  K 5.0  CL 104  CO2 18*  GLUCOSE 315*  BUN 22  CREATININE 1.39*  CALCIUM 8.7*   Recent Labs    04/07/23 1546  HGBA1C 9.5*   Recent Labs  Lab 04/07/23 2003 04/08/23 0002 04/08/23 0411 04/08/23 0655  GLUCAP 300* 249* 190* 164*   Recent Labs    04/07/23 1546  TSH 2.254   Recent Labs  Lab 04/07/23 1546  PROCALCITON 0.14  No results found for this or any previous visit (from the past 240 hour(s)).   Antimicrobials: Anti-infectives (From admission, onward)    None     Culture/Microbiology No results found for: "SDES", "SPECREQUEST", "CULT", "REPTSTATUS"  Radiology Studies: CARDIAC CATHETERIZATION  Result Date: 04/07/2023 1.  Severe proximal and moderate distal left main disease of 70 and 50% 2.  Severe mid LAD stenosis of 80% 3.  Severe ostial and mid circumflex stenoses of 80 and 90% 4.  Total occlusion of the mid RCA with left-to-right collaterals 5.  Severely elevated LVEDP of 29 mmHg The patient has severe calcific multivessel coronary artery disease including left main disease, total occlusion of the RCA, and severe stenoses in the LAD and circumflex vessels.  Due to comorbid conditions, she does not appear to be a candidate for CABG.  Favor medical therapy for coronary artery disease and continued diuresis for treatment of heart failure.  Further management and care per primary cardiology team.   CT Angio Chest/Abd/Pel for Dissection W and/or Wo Contrast  Result Date: 04/07/2023 CLINICAL DATA:  Three day history of intermittent chest pain associated with tachycardia EXAM: CT ANGIOGRAPHY CHEST, ABDOMEN AND PELVIS TECHNIQUE: Non-contrast CT of the chest was initially obtained. Multidetector CT imaging through the chest, abdomen and pelvis was performed using the standard protocol during bolus administration of intravenous contrast. Multiplanar reconstructed images and MIPs were obtained and reviewed to evaluate the vascular anatomy. RADIATION DOSE REDUCTION: This exam was performed according to the departmental dose-optimization program which includes automated exposure control, adjustment of the mA and/or kV according to patient size and/or use of iterative reconstruction technique. CONTRAST:  OMNIPAQUE IOHEXOL 350 MG/ML SOLN COMPARISON:  Chest radiograph dated 04/07/2023 FINDINGS: CTA CHEST FINDINGS Cardiovascular: Preferential opacification of the thoracic aorta. No evidence of  thoracic aortic aneurysm or dissection. Left atrial enlargement. No pericardial effusion. Anatomic variant common origin of the brachiocephalic and common carotid arteries. Severe stenosis of the proximal left subclavian artery due to atherosclerotic plaque. No pulmonary emboli to the level of the subsegmental arteries. Coronary artery calcifications. Mediastinum/Nodes: Imaged thyroid gland without nodules meeting criteria for imaging follow-up by size. Normal  esophagus. No pathologically enlarged axillary, supraclavicular, mediastinal, or hilar lymph nodes. Lungs/Pleura: The central airways are patent. Mild bilateral interlobular septal thickening with bilateral perihilar peribronchovascular ground-glass densities. No pneumothorax. Trace bilateral pleural effusions, left-greater-than-right. Musculoskeletal: No acute or abnormal lytic or blastic osseous lesions. Review of the MIP images confirms the above findings. CTA ABDOMEN AND PELVIS FINDINGS VASCULAR Aorta: Aortic atherosclerosis. Normal caliber aorta without aneurysm, dissection, vasculitis or significant stenosis. Celiac: Patent without evidence of aneurysm, dissection, vasculitis or significant stenosis. SMA: Mild-to-moderate narrowing of the SMA origin due to atherosclerotic plaque. No evidence of aneurysm, dissection, or vasculitis. Renals: 2 right and 3 left renal arteries. Moderate narrowing of the dominant bilateral renal arteries due to atherosclerotic plaque. No evidence of aneurysm, dissection, or vasculitis. IMA: Patent without evidence of aneurysm, dissection, vasculitis or significant stenosis. Inflow: Non opacification proximal common iliac arteries. Reconstitution immediately proximal to the bifurcation. Segmental mild luminal narrowing of the external and internal iliac arteries to atherosclerotic plaque. Proximal Outflow: Mild narrowing of the common femoral arteries due to atherosclerotic plaque. Visualized portions of the superficial and  profunda femoral arteries are patent without evidence of aneurysm, dissection, vasculitis or significant stenosis. Veins: No obvious venous abnormality within the limitations of this arterial phase study. Review of the MIP images confirms the above findings. NON-VASCULAR Hepatobiliary: No focal hepatic lesions. No intra or extrahepatic biliary ductal dilation. Normal gallbladder. Pancreas: No focal lesions or main ductal dilation. Spleen: Normal in size without focal abnormality. Adrenals/Urinary Tract: No adrenal nodules. No suspicious renal mass, calculi or hydronephrosis. No focal bladder wall thickening. Stomach/Bowel: Normal appearance of the stomach. No evidence of bowel wall thickening, distention, or inflammatory changes. Normal appendix. Lymphatic: No enlarged abdominal or pelvic lymph nodes. Reproductive: No adnexal masses. Other: No free fluid, fluid collection, or free air. Musculoskeletal: No acute or abnormal lytic or blastic osseous lesions. Review of the MIP images confirms the above findings. IMPRESSION: 1. No evidence of thoracic or abdominal aortic aneurysm or dissection. 2. Non opacification of the proximal common iliac arteries with reconstitution immediately proximal to the bifurcation. 3. Severe stenosis of the proximal left subclavian artery due to atherosclerotic plaque. 4. Mild bilateral interlobular septal thickening with bilateral perihilar peribronchovascular ground-glass densities, keeping with pulmonary edema. 5. Trace bilateral pleural effusions, left-greater-than-right. 6. No pulmonary emboli to the level of the subsegmental arteries. 7. No acute findings in the abdomen or pelvis. 8. Aortic Atherosclerosis (ICD10-I70.0). Coronary artery calcifications. Assessment for potential risk factor modification, dietary therapy or pharmacologic therapy may be warranted, if clinically indicated. Electronically Signed   By: Agustin Cree M.D.   On: 04/07/2023 12:04   DG Chest Port 1 View  Result  Date: 04/07/2023 CLINICAL DATA:  Three day history of intermittent chest pain EXAM: PORTABLE CHEST 1 VIEW COMPARISON:  None Available. FINDINGS: Normal lung volumes. Prominent bilateral perihilar vasculature. No pleural effusion or pneumothorax. Enlarged cardiomediastinal silhouette is likely projectional. No acute osseous abnormality. IMPRESSION: 1. Prominent bilateral perihilar vasculature, which may be seen in the setting of pulmonary edema. 2. Normal radiographic appearance of the partially imaged left shoulder. Electronically Signed   By: Agustin Cree M.D.   On: 04/07/2023 09:29    LOS: 0 days  Lanae Boast, MD  [facility] 04/08/2023, 9:31 AM

## 2023-04-08 NOTE — Evaluation (Addendum)
Physical Therapy Evaluation Patient Details Name: Kylie Ward MRN: [id number] DOB: 1946/09/12 Today's Date: 04/08/2023  History of Present Illness  Pt is 77 year old presented to [facility] on  04/07/23  for chest pain. Pt with NSTEMI. Cardiac cath showed severe multivessel CAD. Not a candidate for CABG and medical therapy advised. PMH - DM, HTN, carotid disease, CVA, anxiety, dementia  Clinical Impression  Pt admitted with above diagnosis and presents to PT with functional limitations due to deficits listed below (See PT problem list). Pt needs skilled PT to maximize independence and safety. Pt's decr in mobility is likely due to inactivity over past several days. Husband feels confident that they can manage at home. Expect she will make steady return to baseline.         Recommendations for follow up therapy are one component of a multi-disciplinary discharge planning process, led by the attending physician.  Recommendations may be updated based on patient status, additional functional criteria and insurance authorization.  Follow Up Recommendations       Assistance Recommended at Discharge Frequent or constant Supervision/Assistance  Patient can return home with the following  A little help with walking and/or transfers;A little help with bathing/dressing/bathroom;Help with stairs or ramp for entrance;Assistance with cooking/housework;Assist for transportation;Direct supervision/assist for medications management    Equipment Recommendations None recommended by PT  Recommendations for Other Services       Functional Status Assessment Patient has had a recent decline in their functional status and demonstrates the ability to make significant improvements in function in a reasonable and predictable amount of time.     Precautions / Restrictions Precautions Precautions: Fall;Other (comment) Precaution Comments: radial cath 6/6 Restrictions Weight Bearing Restrictions: No       Mobility  Bed Mobility Overal bed mobility: Needs Assistance Bed Mobility: Supine to Sit     Supine to sit: Max assist     General bed mobility comments: Assist to bring legs off of bed, elevate trunk into sitting and bring hips to EOB    Transfers Overall transfer level: Needs assistance Equipment used: Rolling walker (2 wheels) Transfers: Sit to/from Stand Sit to Stand: Min assist           General transfer comment: Assist to power up and for balance. Verbal/tactile cues for hand placement    Ambulation/Gait Ambulation/Gait assistance: Min assist Gait Distance (Feet): 150 Feet Assistive device: Rolling walker (2 wheels) Gait Pattern/deviations: Step-through pattern, Decreased step length - right, Decreased step length - left, Shuffle Gait velocity: decr Gait velocity interpretation: <1.31 ft/sec, indicative of household ambulator   General Gait Details: Assist primarily to guide walker and for safety  Stairs            Wheelchair Mobility    Modified Rankin (Stroke Patients Only)       Balance Overall balance assessment: Needs assistance Sitting-balance support: No upper extremity supported, Feet supported Sitting balance-Leahy Scale: Fair Sitting balance - Comments: Initial rt lean but after purewick removed it was improved Postural control: Right lateral lean, Posterior lean Standing balance support: Bilateral upper extremity supported Standing balance-Leahy Scale: Poor Standing balance comment: walker and min guard for static standing                             Pertinent Vitals/Pain Pain Assessment Pain Assessment: No/denies pain    Home Living Family/patient expects to be discharged to:: Private residence Living Arrangements: Spouse/significant other Available Help at  Discharge: Family;Available 24 hours/day Type of Home: House Home Access: Stairs to enter Entrance Stairs-Rails: Doctor, general practice of Steps:  3   Home Layout: One level Home Equipment: Agricultural consultant (2 wheels);Rollator (4 wheels)      Prior Function Prior Level of Function : Needs assist             Mobility Comments: Pt amb modified independent without assistive device       Hand Dominance        Extremity/Trunk Assessment   Upper Extremity Assessment Upper Extremity Assessment: Defer to OT evaluation    Lower Extremity Assessment Lower Extremity Assessment: Generalized weakness       Communication   Communication: No difficulties  Cognition Arousal/Alertness: Awake/alert Behavior During Therapy: WFL for tasks assessed/performed Overall Cognitive Status: History of cognitive impairments - at baseline                                 General Comments: History of dementia        General Comments General comments (skin integrity, edema, etc.): VSS on RA    Exercises     Assessment/Plan    PT Assessment Patient needs continued PT services  PT Problem List Decreased strength;Decreased activity tolerance;Decreased balance;Decreased mobility;Decreased knowledge of use of DME;Decreased safety awareness       PT Treatment Interventions DME instruction;Gait training;Stair training;Functional mobility training;Therapeutic activities;Therapeutic exercise;Balance training;Patient/family education    PT Goals (Current goals can be found in the Care Plan section)  Acute Rehab PT Goals Patient Stated Goal: go home PT Goal Formulation: With patient/family Time For Goal Achievement: 04/22/23 Potential to Achieve Goals: Good    Frequency Min 1X/week     Co-evaluation               AM-PAC PT "6 Clicks" Mobility  Outcome Measure Help needed turning from your back to your side while in a flat bed without using bedrails?: A Lot Help needed moving from lying on your back to sitting on the side of a flat bed without using bedrails?: A Lot Help needed moving to and from a bed to a chair  (including a wheelchair)?: A Little Help needed standing up from a chair using your arms (e.g., wheelchair or bedside chair)?: A Little Help needed to walk in hospital room?: A Little Help needed climbing 3-5 steps with a railing? : A Little 6 Click Score: 16    End of Session Equipment Utilized During Treatment: Gait belt Activity Tolerance: Patient tolerated treatment well Patient left: in chair;with call bell/phone within reach;with chair alarm set;with family/visitor present Nurse Communication: Mobility status PT Visit Diagnosis: Unsteadiness on feet (R26.81);Other abnormalities of gait and mobility (R26.89);Muscle weakness (generalized) (M62.81)    Time: [id number] PT Time Calculation (min) (ACUTE ONLY): 31 min   Charges:   PT Evaluation $PT Eval Moderate Complexity: 1 Mod PT Treatments $Gait Training: 8-22 mins        [facility] PT Acute Rehabilitation Services Office [phone]   Angelina Ok [facility] 04/08/2023, 1:33 PM

## 2023-04-08 NOTE — Progress Notes (Signed)
  Echocardiogram 2D Echocardiogram has been performed.  Kylie Ward 04/08/2023, 3:28 PM

## 2023-04-08 NOTE — Progress Notes (Signed)
CARDIAC REHAB PHASE I   Pt restring in bed, feeling well this morning. Post MI education including site care, restrictions, risk factors, heart healthy diabetic diet, exercise guidelines, NTG use, MI booklet and CRP2 reviewed with pt and husband. All questions and concerns addressed. Will refer to AP for CRP2 per protocol. Unknown if and when she may be able to participate. PT/OT consulted for mobility. Await their assessment prior to ambulating with pt. Will continue to follow.   [id number]  Woodroe Chen, RN BSN 04/08/2023 11:02 AM

## 2023-04-08 NOTE — Progress Notes (Signed)
2D echo attempted a second time, patient in chair. Will try later

## 2023-04-08 NOTE — Inpatient Diabetes Management (Signed)
Inpatient Diabetes Program Recommendations  AACE/ADA: New Consensus Statement on Inpatient Glycemic Control (2015)  Target Ranges:  Prepandial:   less than 140 mg/dL      Peak postprandial:   less than 180 mg/dL (1-2 hours)      Critically ill patients:  140 - 180 mg/dL   Lab Results  Component Value Date   GLUCAP 157 (H) 04/08/2023   HGBA1C 9.5 (H) 04/07/2023    Review of Glycemic Control  Latest Reference Range & Units 04/08/23 00:02 04/08/23 04:11 04/08/23 06:55 04/08/23 11:49  Glucose-Capillary 70 - 99 mg/dL 409 (H) 811 (H) 914 (H) 157 (H)   Diabetes history: Type 2 DM Outpatient Diabetes medications: Trulicity 4.5 mg qwk, Lantus 65 units QHS, Humalog 12 units TID, Metformin 750 mg BID Current orders for Inpatient glycemic control:  Semglee 30 units qhs Novolog 0-15 units Q4 hours  A1c 9.5% on 6/6  Spoke with pt and husband at bedside regarding A1c of 9.5% and glucose control at home. Pt reports taking medications consistently and that she utilizes a CGM [employer] 2. Pt reports glucose trends have been elevated recently. She remembers that her last A1c was around an 8%. We discussed lifestyle modifications with diet. Reviewed glucose and A1c goals.  Encouraged PCP follow up.  Thanks, Christena Deem RN, MSN, BC-ADM Inpatient Diabetes Coordinator Team Pager [phone] (8a-5p)

## 2023-04-08 NOTE — Progress Notes (Addendum)
Rounding Note    Patient Name: Kylie Ward Date of Encounter: 04/08/2023  [facility] Cardiologist:  Nahser   Subjective   Feeling good post cath without any significant complaints.   Inpatient Medications    Scheduled Meds:  [START ON 04/09/2023] aspirin EC  81 mg Oral Daily   atorvastatin  40 mg Oral Daily   donepezil  10 mg Oral QHS   escitalopram  20 mg Oral Daily   fenofibrate  160 mg Oral Daily   furosemide  40 mg Intravenous BID   insulin aspart  0-15 Units Subcutaneous Q4H   insulin glargine-yfgn  30 Units Subcutaneous QHS   lisinopril  20 mg Oral Daily   memantine  10 mg Oral BID   mirabegron ER  25 mg Oral Daily   potassium chloride  20 mEq Oral Daily   sodium chloride flush  3 mL Intravenous Q12H   sodium chloride flush  3 mL Intravenous Q12H   traZODone  50 mg Oral QHS   Continuous Infusions:  sodium chloride     PRN Meds: sodium chloride, acetaminophen **OR** acetaminophen, ALPRAZolam, ondansetron **OR** ondansetron (ZOFRAN) IV, sodium chloride flush   Vital Signs    Vitals:   04/07/23 2104 04/07/23 2341 04/08/23 0401 04/08/23 0744  BP: 128/86 103/68 100/61 92/64  Pulse:  99 98 95  Resp:  18 18 18   Temp:  97.9 F (36.6 C) 98.2 F (36.8 C)   TempSrc:  Oral Oral   SpO2:  100% 98% 94%  Weight:   71.6 kg   Height:        Intake/Output Summary (Last 24 hours) at 04/08/2023 0806 Last data filed at 04/08/2023 0413 Gross per 24 hour  Intake 318.06 ml  Output 1110 ml  Net -791.94 ml      04/08/2023    4:01 AM 04/07/2023    3:22 PM 04/07/2023    8:36 AM  Last 3 Weights  Weight (lbs) 157 lb 14.4 oz 163 lb 12.8 oz 160 lb 15 oz  Weight (kg) 71.623 kg 74.3 kg 73 kg      Telemetry    NSR HR 90-100. ST depression. - Personally Reviewed  ECG    Sinus tachycardia HR 111, LBBB - Personally Reviewed  Physical Exam   GEN: No acute distress.   Neck: No JVD Cardiac: RRR, no murmurs, rubs, or gallops.  Respiratory: Clear to auscultation  bilaterally. GI: Soft, nontender, non-distended  MS: No edema; No deformity. R radial cath site with no signs of discharge, erythema, tenderness.  Neuro:  Nonfocal  Psych: Normal affect   Labs    High Sensitivity Troponin:   Recent Labs  Lab 04/07/23 0838 04/07/23 1136  TROPONINIHS 339* 786*     Chemistry Recent Labs  Lab 04/07/23 0838  NA 135  K 5.0  CL 104  CO2 18*  GLUCOSE 315*  BUN 22  CREATININE 1.39*  CALCIUM 8.7*  GFRNONAA 40*  ANIONGAP 13    Lipids No results for input(s): "CHOL", "TRIG", "HDL", "LABVLDL", "LDLCALC", "CHOLHDL" in the last 168 hours.  Hematology Recent Labs  Lab 04/07/23 0838  WBC 14.4*  RBC 3.73*  HGB 11.5*  HCT 34.7*  MCV 93.0  MCH 30.8  MCHC 33.1  RDW 13.5  PLT 206   Thyroid  Recent Labs  Lab 04/07/23 1546  TSH 2.254    BNP Recent Labs  Lab 04/07/23 0838  BNP 870.1*    DDimer No results for input(s): "DDIMER" in  the last 168 hours.   Radiology    CARDIAC CATHETERIZATION  Result Date: 04/07/2023 1.  Severe proximal and moderate distal left main disease of 70 and 50% 2.  Severe mid LAD stenosis of 80% 3.  Severe ostial and mid circumflex stenoses of 80 and 90% 4.  Total occlusion of the mid RCA with left-to-right collaterals 5.  Severely elevated LVEDP of 29 mmHg The patient has severe calcific multivessel coronary artery disease including left main disease, total occlusion of the RCA, and severe stenoses in the LAD and circumflex vessels.  Due to comorbid conditions, she does not appear to be a candidate for CABG.  Favor medical therapy for coronary artery disease and continued diuresis for treatment of heart failure.  Further management and care per primary cardiology team.   CT Angio Chest/Abd/Pel for Dissection W and/or Wo Contrast  Result Date: 04/07/2023 CLINICAL DATA:  Three day history of intermittent chest pain associated with tachycardia EXAM: CT ANGIOGRAPHY CHEST, ABDOMEN AND PELVIS TECHNIQUE: Non-contrast CT of the  chest was initially obtained. Multidetector CT imaging through the chest, abdomen and pelvis was performed using the standard protocol during bolus administration of intravenous contrast. Multiplanar reconstructed images and MIPs were obtained and reviewed to evaluate the vascular anatomy. RADIATION DOSE REDUCTION: This exam was performed according to the departmental dose-optimization program which includes automated exposure control, adjustment of the mA and/or kV according to patient size and/or use of iterative reconstruction technique. CONTRAST:  OMNIPAQUE IOHEXOL 350 MG/ML SOLN COMPARISON:  Chest radiograph dated 04/07/2023 FINDINGS: CTA CHEST FINDINGS Cardiovascular: Preferential opacification of the thoracic aorta. No evidence of thoracic aortic aneurysm or dissection. Left atrial enlargement. No pericardial effusion. Anatomic variant common origin of the brachiocephalic and common carotid arteries. Severe stenosis of the proximal left subclavian artery due to atherosclerotic plaque. No pulmonary emboli to the level of the subsegmental arteries. Coronary artery calcifications. Mediastinum/Nodes: Imaged thyroid gland without nodules meeting criteria for imaging follow-up by size. Normal esophagus. No pathologically enlarged axillary, supraclavicular, mediastinal, or hilar lymph nodes. Lungs/Pleura: The central airways are patent. Mild bilateral interlobular septal thickening with bilateral perihilar peribronchovascular ground-glass densities. No pneumothorax. Trace bilateral pleural effusions, left-greater-than-right. Musculoskeletal: No acute or abnormal lytic or blastic osseous lesions. Review of the MIP images confirms the above findings. CTA ABDOMEN AND PELVIS FINDINGS VASCULAR Aorta: Aortic atherosclerosis. Normal caliber aorta without aneurysm, dissection, vasculitis or significant stenosis. Celiac: Patent without evidence of aneurysm, dissection, vasculitis or significant stenosis. SMA:  Mild-to-moderate narrowing of the SMA origin due to atherosclerotic plaque. No evidence of aneurysm, dissection, or vasculitis. Renals: 2 right and 3 left renal arteries. Moderate narrowing of the dominant bilateral renal arteries due to atherosclerotic plaque. No evidence of aneurysm, dissection, or vasculitis. IMA: Patent without evidence of aneurysm, dissection, vasculitis or significant stenosis. Inflow: Non opacification proximal common iliac arteries. Reconstitution immediately proximal to the bifurcation. Segmental mild luminal narrowing of the external and internal iliac arteries to atherosclerotic plaque. Proximal Outflow: Mild narrowing of the common femoral arteries due to atherosclerotic plaque. Visualized portions of the superficial and profunda femoral arteries are patent without evidence of aneurysm, dissection, vasculitis or significant stenosis. Veins: No obvious venous abnormality within the limitations of this arterial phase study. Review of the MIP images confirms the above findings. NON-VASCULAR Hepatobiliary: No focal hepatic lesions. No intra or extrahepatic biliary ductal dilation. Normal gallbladder. Pancreas: No focal lesions or main ductal dilation. Spleen: Normal in size without focal abnormality. Adrenals/Urinary Tract: No adrenal nodules. No suspicious renal  mass, calculi or hydronephrosis. No focal bladder wall thickening. Stomach/Bowel: Normal appearance of the stomach. No evidence of bowel wall thickening, distention, or inflammatory changes. Normal appendix. Lymphatic: No enlarged abdominal or pelvic lymph nodes. Reproductive: No adnexal masses. Other: No free fluid, fluid collection, or free air. Musculoskeletal: No acute or abnormal lytic or blastic osseous lesions. Review of the MIP images confirms the above findings. IMPRESSION: 1. No evidence of thoracic or abdominal aortic aneurysm or dissection. 2. Non opacification of the proximal common iliac arteries with reconstitution  immediately proximal to the bifurcation. 3. Severe stenosis of the proximal left subclavian artery due to atherosclerotic plaque. 4. Mild bilateral interlobular septal thickening with bilateral perihilar peribronchovascular ground-glass densities, keeping with pulmonary edema. 5. Trace bilateral pleural effusions, left-greater-than-right. 6. No pulmonary emboli to the level of the subsegmental arteries. 7. No acute findings in the abdomen or pelvis. 8. Aortic Atherosclerosis (ICD10-I70.0). Coronary artery calcifications. Assessment for potential risk factor modification, dietary therapy or pharmacologic therapy may be warranted, if clinically indicated. Electronically Signed   By: Agustin Cree M.D.   On: 04/07/2023 12:04   DG Chest Port 1 View  Result Date: 04/07/2023 CLINICAL DATA:  Three day history of intermittent chest pain EXAM: PORTABLE CHEST 1 VIEW COMPARISON:  None Available. FINDINGS: Normal lung volumes. Prominent bilateral perihilar vasculature. No pleural effusion or pneumothorax. Enlarged cardiomediastinal silhouette is likely projectional. No acute osseous abnormality. IMPRESSION: 1. Prominent bilateral perihilar vasculature, which may be seen in the setting of pulmonary edema. 2. Normal radiographic appearance of the partially imaged left shoulder. Electronically Signed   By: Agustin Cree M.D.   On: 04/07/2023 09:29    Cardiac Studies   Heart Catheterization 04/07/2023 1.  Severe proximal and moderate distal left main disease of 70 and 50% 2.  Severe mid LAD stenosis of 80% 3.  Severe ostial and mid circumflex stenoses of 80 and 90% 4.  Total occlusion of the mid RCA with left-to-right collaterals 5.  Severely elevated LVEDP of 29 mmHg   The patient has severe calcific multivessel coronary artery disease including left main disease, total occlusion of the RCA, and severe stenoses in the LAD and circumflex vessels.  Due to comorbid conditions, she does not appear to be a candidate for CABG.   Favor medical therapy for coronary artery disease and continued diuresis for treatment of heart failure.  Further management and care per primary cardiology team.  Patient Profile     77 y.o. female  with hx of dementia, HTN, HLD, DM, CVA, Carotid artery stenosis s/p left ICA stenting and angioplasty, right carotid artery endarterectomy who presented for the evaluation of NSTEMI.  Underwent cardiac catheterization yesterday on 04/07/2023 and found to have multivessel disease not amenable to CABG due to comorbid conditions.  Assessment & Plan     NSTEMI with multivessel disease Cardiac catheterization showing severe left main disease of 70 and 50%, 80% stenosis of the mid LAD, severe ostial and mid circumflex stenosis of 80 to 90%, CTO of the RCA with severely elevated LVEDP of 29.  Due to comorbid conditions patient will be medically manage and would not be a candidate for CABG.  Has echocardiogram pending today to assess LVEF. Continue atorvastatin 40 mg, start BB as below Not a good candidate for Effient due to history of CVA. Consider plavix.  Suspected CHF Hypertension Bilateral pleural effusions Echocardiogram pending for today.  BNP 870.  She has labile blood pressures that have been elevated but also hypotensive. Stopping IV  lasix and will transition to PO 40mg  daily. Pressures have been soft.  Will decrease lisinopril 20mg  to 10mg .  Starting metoprolol lopressor 12.5mg  given tachycardia. Will reassess once echo comes back for further titration of GDMT.  Hyperlipidemia LDL 182, triglycerides 454.  Currently on atorvastatin 40 mg daily and fenofibrate 160mg .  Likely needs titration and possibly other agents.  Will evaluate for myalgias and continue outpatient titration as she tolerates. Checking LPa   Cerebrovascular accident (CVA) due to embolism of precerebral artery/PVD DM (A1c 9.5) Dementia    For questions or updates, please contact [facility] Please consult  [email or web address] for contact info under        Signed, Abagail Kitchens, PA-C  04/08/2023, 8:06 AM      Attending Note:   The patient was seen and examined.  Agree with assessment and plan as noted above.  Changes made to the above note as needed.  Patient seen and independently examined with Yvonna Alanis, PA .   We discussed all aspects of the encounter. I agree with the assessment and plan as stated above.     CAD :  pt has severe 3 V cad.   Given her overall frail state and her dementia, she is a very poor candidate for coronary artery bypass grafting.  Will continue with medical therapy for now.  We have added Plavix 75 mg a day.  Will add metoprolol to help slow her heart rate.  She currently is in sinus tachycardia at a rate of 100.  We can consolidate that to Toprol-XL at the time of discharge. At present her blood pressure is too low to consider adding isosorbide but this could be added in the future if her blood pressure goes up.  2.  Hypertension: It is apparent that she eats a lot of salt.  She her LVEDP was 29 at the time of heart catheterization.  She is diuresed well.  Will send her home on Lasix 40 mg a day and follow.  We have decreased her lisinopril to 10 mg a day to allow some pressure to add the metoprolol.  3.  Hyperlipidemia: She was on simvastatin on presentation.  This has been changed to atorvastatin 40 mg a day.  Her goal LDL is 50-70.   Will have her ambulate in the halls.  If she is stable tomorrow then we can consider discharge.  She lives in [place].  She may want to follow-up in our [place] office but I am happy to see her here in [place] if she would like.   I have spent a total of 40 minutes with patient reviewing hospital  notes , telemetry, EKGs, labs and examining patient as well as establishing an assessment and plan that was discussed with the patient.  > 50% of time was spent in direct patient care.    Vesta Mixer, Montez Hageman., MD,  [facility] 04/08/2023, 9:58 AM [address]  [address] Office [phone] Pager [phone]

## 2023-04-08 NOTE — Progress Notes (Signed)
Mobility Specialist Progress Note:   04/08/23 1600  Mobility  Activity Ambulated with assistance in hallway  Level of Assistance Minimal assist, patient does 75% or more  Assistive Device Front wheel walker  Distance Ambulated (ft) 200 ft  Activity Response Tolerated well  Mobility Referral Yes  $Mobility charge 1 Mobility  Mobility Specialist Start Time (ACUTE ONLY) 1600  Mobility Specialist Stop Time (ACUTE ONLY) 1620  Mobility Specialist Time Calculation (min) (ACUTE ONLY) 20 min   Pt agreeable to mobility session with mod encouragement + family arrival and encouragement. Required minA for bed mobility, minG to stand and ambulate. Pt left sitting in chair with all needs met, family in room.  Addison Lank Mobility Specialist Please contact via SecureChat or  Rehab office at [phone]

## 2023-04-08 NOTE — Progress Notes (Signed)
2D echo attempted, patient in chair. Will try later 

## 2023-04-09 ENCOUNTER — Telehealth: Payer: Self-pay | Admitting: Cardiology

## 2023-04-09 DIAGNOSIS — I200 Unstable angina: Secondary | ICD-10-CM

## 2023-04-09 DIAGNOSIS — I214 Non-ST elevation (NSTEMI) myocardial infarction: Secondary | ICD-10-CM | POA: Diagnosis not present

## 2023-04-09 LAB — CULTURE, BLOOD (ROUTINE X 2): Special Requests: ADEQUATE

## 2023-04-09 LAB — GLUCOSE, CAPILLARY
Glucose-Capillary: 112 mg/dL — ABNORMAL HIGH (ref 70–99)
Glucose-Capillary: 115 mg/dL — ABNORMAL HIGH (ref 70–99)
Glucose-Capillary: 117 mg/dL — ABNORMAL HIGH (ref 70–99)
Glucose-Capillary: 143 mg/dL — ABNORMAL HIGH (ref 70–99)

## 2023-04-09 LAB — CBC
HCT: 31.6 % — ABNORMAL LOW (ref 36.0–46.0)
Hemoglobin: 10.5 g/dL — ABNORMAL LOW (ref 12.0–15.0)
MCH: 30.3 pg (ref 26.0–34.0)
MCHC: 33.2 g/dL (ref 30.0–36.0)
MCV: 91.3 fL (ref 80.0–100.0)
Platelets: 213 10*3/uL (ref 150–400)
RBC: 3.46 MIL/uL — ABNORMAL LOW (ref 3.87–5.11)
RDW: 13.6 % (ref 11.5–15.5)
WBC: 8.9 10*3/uL (ref 4.0–10.5)
nRBC: 0 % (ref 0.0–0.2)

## 2023-04-09 MED ORDER — LISINOPRIL 10 MG PO TABS: 10.0000 mg | ORAL_TABLET | Freq: Every day | ORAL | 0 refills | Status: DC

## 2023-04-09 MED ORDER — CLOPIDOGREL BISULFATE 75 MG PO TABS: 75.0000 mg | ORAL_TABLET | Freq: Every day | ORAL | 0 refills | Status: DC

## 2023-04-09 MED ORDER — INSULIN GLARGINE-YFGN 100 UNIT/ML ~~LOC~~ SOLN: 30.0000 [IU] | Freq: Every day | SUBCUTANEOUS | 11 refills | Status: DC

## 2023-04-09 MED ORDER — ATORVASTATIN CALCIUM 40 MG PO TABS: 40.0000 mg | ORAL_TABLET | Freq: Every day | ORAL | 0 refills | Status: DC

## 2023-04-09 MED ORDER — METOPROLOL SUCCINATE ER 25 MG PO TB24
25.0000 mg | ORAL_TABLET | Freq: Every day | ORAL | Status: DC
  Administered 2023-04-09: 25 mg via ORAL
  Filled 2023-04-09: qty 1

## 2023-04-09 MED ORDER — METOPROLOL SUCCINATE ER 25 MG PO TB24: 25.0000 mg | ORAL_TABLET | Freq: Every day | ORAL | 0 refills | Status: DC

## 2023-04-09 MED ORDER — ACETAMINOPHEN 325 MG PO TABS: 650.0000 mg | ORAL_TABLET | Freq: Four times a day (QID) | ORAL | 0 refills | Status: AC | PRN

## 2023-04-09 NOTE — Progress Notes (Signed)
Physical Therapy Treatment Patient Details Name: MACYN RUTZ MRN: [id number] DOB: November 05, 1945 Today's Date: 04/09/2023   History of Present Illness Pt is 77 year old presented to [facility] on  04/07/23  for chest pain. Pt with NSTEMI. Cardiac cath showed severe multivessel CAD. Not a candidate for CABG and medical therapy advised. PMH - DM, HTN, carotid disease, CVA, anxiety, dementia    PT Comments    Pt tolerated treatment well today. Pt was able to ambulate in hallway with RW and navigate 2 steps Min G/Min A. Pt presents at or near baseline mobility. Pt has no further acute PT needs and will be signing off. Re consult PT if mobility status changes. No change in DC/DME recs at this time.    Recommendations for follow up therapy are one component of a multi-disciplinary discharge planning process, led by the attending physician.  Recommendations may be updated based on patient status, additional functional criteria and insurance authorization.  Follow Up Recommendations       Assistance Recommended at Discharge Frequent or constant Supervision/Assistance  Patient can return home with the following A little help with walking and/or transfers;A little help with bathing/dressing/bathroom;Help with stairs or ramp for entrance;Assistance with cooking/housework;Assist for transportation;Direct supervision/assist for medications management   Equipment Recommendations  None recommended by PT    Recommendations for Other Services       Precautions / Restrictions Precautions Precautions: Fall;Other (comment) Restrictions Weight Bearing Restrictions: No     Mobility  Bed Mobility Overal bed mobility: Needs Assistance Bed Mobility: Supine to Sit, Sit to Supine     Supine to sit: Min assist Sit to supine: Min assist   General bed mobility comments: Assist to bring legs off of bed, elevate trunk into sitting and bring hips to EOB    Transfers Overall transfer level: Needs  assistance Equipment used: Rolling walker (2 wheels) Transfers: Sit to/from Stand Sit to Stand: Min guard           General transfer comment: Assist to power up and for balance. Verbal/tactile cues for hand placement    Ambulation/Gait Ambulation/Gait assistance: Min guard Gait Distance (Feet): 300 Feet Assistive device: Rolling walker (2 wheels) Gait Pattern/deviations: Step-through pattern, Decreased step length - right, Decreased step length - left, Shuffle Gait velocity: decr     General Gait Details: Assist primarily to guide walker and for safety   Stairs Stairs: Yes Stairs assistance: Min assist Stair Management: Two rails, Step to pattern, Forwards Number of Stairs: 2 General stair comments: Max verbal and tactile cues for sequencing   Wheelchair Mobility    Modified Rankin (Stroke Patients Only)       Balance Overall balance assessment: Needs assistance Sitting-balance support: No upper extremity supported, Feet supported Sitting balance-Leahy Scale: Good     Standing balance support: Bilateral upper extremity supported Standing balance-Leahy Scale: Poor Standing balance comment: walker and min guard for static standing                            Cognition Arousal/Alertness: Awake/alert Behavior During Therapy: WFL for tasks assessed/performed Overall Cognitive Status: History of cognitive impairments - at baseline                                 General Comments: History of dementia        Exercises      General Comments General  comments (skin integrity, edema, etc.): VSS on RA      Pertinent Vitals/Pain Pain Assessment Pain Assessment: No/denies pain    Home Living                          Prior Function            PT Goals (current goals can now be found in the care plan section) Progress towards PT goals: Goals met/education completed, patient discharged from PT    Frequency    Min  1X/week      PT Plan Current plan remains appropriate    Co-evaluation              AM-PAC PT "6 Clicks" Mobility   Outcome Measure  Help needed turning from your back to your side while in a flat bed without using bedrails?: A Little Help needed moving from lying on your back to sitting on the side of a flat bed without using bedrails?: A Little Help needed moving to and from a bed to a chair (including a wheelchair)?: A Little Help needed standing up from a chair using your arms (e.g., wheelchair or bedside chair)?: A Little Help needed to walk in hospital room?: A Little Help needed climbing 3-5 steps with a railing? : A Little 6 Click Score: 18    End of Session Equipment Utilized During Treatment: Gait belt Activity Tolerance: Patient tolerated treatment well Patient left: in bed;with call bell/phone within reach;with family/visitor present Nurse Communication: Mobility status PT Visit Diagnosis: Unsteadiness on feet (R26.81);Other abnormalities of gait and mobility (R26.89);Muscle weakness (generalized) (M62.81)     Time: 1610-9604 PT Time Calculation (min) (ACUTE ONLY): 17 min  Charges:  $Gait Training: 8-22 mins                     Shela Nevin, PT, DPT Acute Rehab Services [phone]    Gladys Damme 04/09/2023, 11:24 AM

## 2023-04-09 NOTE — Discharge Summary (Signed)
Physician Discharge Summary  Patient ID: Kylie Ward MRN: [id number] DOB/AGE: 06/03/1946 77 y.o.  Admit date: 04/07/2023 Discharge date: 04/09/2023  Admission Diagnoses:  Discharge Diagnoses:  Principal Problem:   Chest pain Active Problems:   Hyperlipidemia with target LDL less than 100   Essential hypertension, benign   GAD (generalized anxiety disorder)   Late effect of cerebrovascular accident (CVA)   Type 2 diabetes mellitus with diabetic polyneuropathy, with long-term current use of insulin (HCC)   Dementia without behavioral disturbance (HCC)   Coronary artery disease involving native coronary artery of native heart with unstable angina pectoris (HCC)   Mixed hyperlipidemia   Discharged Condition: stable  Hospital Course: Patient is a 77 year old female past medical history significant for diabetes mellitus, hypertension, hyperlipidemia, carotid artery disease, CVA and anxiety.  Patient was admitted with chest pain, with reported radiation to the arms and shortness of breath.  On presentation, EKG revealed old LBBB with sinus tach, Troponin was slightly elevated, CXR showed pulmonary edema.  Patient was admitted to the hospital for further assessment and management.  Patient was started on heparin.  Lab work revealed blood sugar of 315, creatinine of 1.39, troponin 339> 786, BNP 870 procalcitonin 0.14, leukocytosis 14.4 UA with WBC 6-10 small  LA and nitrate positive.  CT angio chest was negative for aneurysm/dissection, but revealed severe stenosis of proximal left subclavian artery due to atherosclerotic plaque, Non opacification of the proximal common iliac arteries with reconstitution immediately proximal to the bifurcation, no PE, aortic atherosclerosis, evidence of pulmonary edema.  Cardiology team directed patient's care during the hospital stay.  Cardiac catheterization done on 04/07/2023 revealed severe calcific multivessel coronary artery disease including left main disease,  total occlusion of the RCA and severe restenosis in the LAD and circumflex vessel> not a candidate for CABG and advised medical therapy and CHF management.  Cardiology team has cleared patient for discharge.  Patient will follow-up primary care provider and cardiology on discharge.   NSTEMI CAD with severe calcific multivessel disease: s/p LHC 6/6- showing severe calcific multivessel disease with- left main disease, total occlusion of the RCA and severe restenosis in the LAD and circumflex vessel> not a candidate for CABG and advised medical therapy and CHF management. Appreciate cardiology input on board continue aspirin 81, Lipitor 40, fibrate 160, lisinopril 20 mg.   Acute systolic CHF Bilateral pleural effusions: Due to NSTEMI and CAD continue IV Lasix, GDMT per cardio. Cont to monitor daily I/O,weight, electrolytes and net balance as below.Keep on  salt/fluid restricted diet and monitor in tele. Net IO Since Admission: -791.94 mL [04/08/23 0931]       Filed Weights    04/07/23 0836 04/07/23 1522 04/08/23 0401  Weight: 73 kg 74.3 kg 71.6 kg    Last Labs     Recent Labs  Lab 04/07/23 0838  BNP 870.1*  BUN 22  CREATININE 1.39*  K 5.0      Hypertension:BP is elevated in the ED. Continue meds per GDMT.   Diabetes mellitus uncontrolled HbA1c at 9.4 with uncontrolled hyperglycemia in 300, continue Semglee 30 units, SSI, cbg improving Last Labs         Recent Labs  Lab 04/07/23 1546 04/07/23 2003 04/08/23 0002 04/08/23 0411 04/08/23 0655  GLUCAP  --  300* 249* 190* 164*  HGBA1C 9.5*  --   --   --   --       Hyperlipidemia: ldl 182, uncontrolled. Started on Lipitor 40, fenofibrate> will likely need to intensify  the regimen,await lP (a)   Leukocytosis likely in the setting of MI procalcitonin negative, does have abnormal UA, monitor CBC.   PAD Carotid artery disease Subclavian stenosis: Patient appears to be a vasculopath CT chest/abd shows multiple findings,continue  aspirin and statin.   AKI versus CKD: previous creatinine 1.5 in March 2024 creatinine 1.3 on admission,follow-up labs. Recent Labs (within last 365 days)        Recent Labs    06/22/22 1412 10/01/22 1452 01/04/23 1506 04/07/23 0838  BUN 22 15 19 22   CREATININE 1.40* 1.01* 1.51* 1.39*  CO2 22 21 15* 18*      Overweight:Body mass index is 27.1 kg/m.:Will benefit with PCP follow-up,weight loss,healthy lifestyle.    Consults: cardiology  Significant Diagnostic Studies:  Cardiac catheterization revealed: 1.  Severe proximal and moderate distal left main disease of 70 and 50% 2.  Severe mid LAD stenosis of 80% 3.  Severe ostial and mid circumflex stenoses of 80 and 90% 4.  Total occlusion of the mid RCA with left-to-right collaterals 5.  Severely elevated LVEDP of 29 mmHg  Treatments:  As per cardiology team: "The patient has severe calcific multivessel coronary artery disease including left main disease, total occlusion of the RCA, and severe stenoses in the LAD and circumflex vessels. Due to comorbid conditions, she does not appear to be a candidate for CABG. Favor medical therapy for coronary artery disease and continued diuresis for treatment of heart failure. Further management and care per primary cardiology team".   Discharge Exam: Blood pressure 129/68, pulse 75, temperature 97.8 F (36.6 C), temperature source Oral, resp. rate 18, height 5\' 4"  (1.626 m), weight 72.9 kg, last menstrual period 01/27/1983, SpO2 95 %.   Disposition: Discharge disposition: 01-Home or Self Care       Discharge Instructions     Amb Referral to Cardiac Rehabilitation   Complete by: As directed    Multivessel disease not candidate for CABG  Medical management order placed per protocol   Diagnosis: NSTEMI   After initial evaluation and assessments completed: Virtual Based Care may be provided alone or in conjunction with Phase 2 Cardiac Rehab based on patient barriers.: Yes   Intensive  Cardiac Rehabilitation (ICR) [facility] location only OR Traditional Cardiac Rehabilitation (TCR) *If criteria for ICR are not met will enroll in TCR [facility] only): Yes   Diet - low sodium heart healthy   Complete by: As directed    Increase activity slowly   Complete by: As directed       Allergies as of 04/09/2023       Reactions   Crestor [rosuvastatin] Other (See Comments)   Myalgias and arthralgias to the point she had difficulty walking. She was tolerant of lipitor from the family's recall of events.    Niaspan [niacin Er] Itching   Skin burning        Medication List     STOP taking these medications    alendronate 70 MG tablet Commonly known as: Fosamax   aspirin 81 MG tablet   HumaLOG 100 UNIT/ML injection Generic drug: insulin lispro   insulin glargine 100 UNIT/ML injection Commonly known as: Lantus   metFORMIN 750 MG 24 hr tablet Commonly known as: GLUCOPHAGE-XR   mirabegron ER 25 MG Tb24 tablet Commonly known as: Myrbetriq   simvastatin 80 MG tablet Commonly known as: ZOCOR Replaced by: atorvastatin 40 MG tablet   Trulicity 4.5 MG/0.5ML Sopn Generic drug: Dulaglutide       TAKE these medications  Accu-Chek FastClix Lancets Misc CHECK BLOOD SUGAR UP TO 3 TIMES A DAY AS DIRECTED Dx E11.42   Accu-Chek Nano SmartView w/Device Kit CHECK BLOOD SUGER UP TO 3 TIMES A DAY   Accu-Chek SmartView test strip Generic drug: glucose blood CHECK BLOOD SUGAR UP TO 3 TIMES A DAY AS DIRECTED   acetaminophen 325 MG tablet Commonly known as: TYLENOL Take 2 tablets (650 mg total) by mouth every 6 (six) hours as needed for up to 7 days for mild pain (or Fever >/= 101). What changed:  medication strength how much to take when to take this reasons to take this   ALPRAZolam 1 MG tablet Commonly known as: XANAX Take 1 tablet (1 mg total) by mouth 2 (two) times daily.   atorvastatin 40 MG tablet Commonly known as: LIPITOR Take 1 tablet (40 mg total) by mouth  daily. Start taking on: April 10, 2023 Replaces: simvastatin 80 MG tablet   clopidogrel 75 MG tablet Commonly known as: PLAVIX Take 1 tablet (75 mg total) by mouth daily. Start taking on: April 10, 2023   donepezil 10 MG tablet Commonly known as: ARICEPT Take 1 tablet (10 mg total) by mouth at bedtime. What changed: when to take this   escitalopram 20 MG tablet Commonly known as: LEXAPRO Take 1 tablet (20 mg total) by mouth daily.   fenofibrate 160 MG tablet Take 1 tablet (160 mg total) by mouth daily.   FreeStyle Libre 2 Reader [place] Use to test blood sugar continuously. DX: E11.65   FreeStyle Libre 2 Sensor Misc Use to test blood sugar continuously. Place new sensor on back of the arm every 14 days as directed.  DX: E11.65   furosemide 40 MG tablet Commonly known as: LASIX Take 1 tablet (40 mg total) by mouth daily. What changed:  when to take this reasons to take this   insulin aspart 100 UNIT/ML injection Commonly known as: novoLOG Inject 0-10 Units into the skin See admin instructions. 0-10 units per sliding scale, three times daily before meals.   insulin glargine-yfgn 100 UNIT/ML injection Commonly known as: SEMGLEE Inject 0.3 mLs (30 Units total) into the skin at bedtime.   lisinopril 10 MG tablet Commonly known as: ZESTRIL Take 1 tablet (10 mg total) by mouth daily. Start taking on: April 10, 2023 What changed:  medication strength how much to take   memantine 10 MG tablet Commonly known as: NAMENDA TAKE ONE TABLET BY MOUTH TWICE DAILY   metoprolol succinate 25 MG 24 hr tablet Commonly known as: TOPROL-XL Take 1 tablet (25 mg total) by mouth daily. Start taking on: April 10, 2023   One-A-Day Womens 50+ Tabs Take 1 tablet by mouth daily.   Sure Comfort Pen Needles 31G X 8 MM Misc Generic drug: Insulin Pen Needle USE TO inject lantus ONCE daily   traZODone 50 MG tablet Commonly known as: DESYREL Take 1 tablet (50 mg total) by mouth at bedtime.   V-2  High Compression Hose Misc 1 each by Does not apply route daily.        Follow-up Information     Mallipeddi, Vishnu P, MD Follow up.   Specialties: Cardiology, Internal Medicine Why: the office should call you Monday with appt date and time.  we are trying to have you seen in [place], if you decide to be seen with Dr. Elease Hashimoto just let team know when they call you. Contact information: [address] [place] [address] [phone]  Time spent: 35 minutes.  SignedBarnetta Chapel 04/09/2023, 12:29 PM

## 2023-04-09 NOTE — Progress Notes (Signed)
Pt walked with PT this morning, denies CP and SOB. Denies questions from teachings yesterday.   Faustino Congress MS, ACSM-CEP 04/09/2023 10:12 AM

## 2023-04-09 NOTE — Progress Notes (Signed)
Rounding Note    Patient Name: Kylie Ward Date of Encounter: 04/09/2023  [facility] Cardiologist:  Nahser   Subjective   Feeling well without acute complaint  Inpatient Medications    Scheduled Meds:  atorvastatin  40 mg Oral Daily   clopidogrel  75 mg Oral Daily   donepezil  10 mg Oral QHS   enoxaparin (LOVENOX) injection  40 mg Subcutaneous Q24H   escitalopram  20 mg Oral Daily   fenofibrate  160 mg Oral Daily   furosemide  40 mg Oral Daily   insulin aspart  0-15 Units Subcutaneous Q4H   insulin glargine-yfgn  30 Units Subcutaneous QHS   lisinopril  10 mg Oral Daily   memantine  10 mg Oral BID   metoprolol tartrate  12.5 mg Oral BID   mirabegron ER  25 mg Oral Daily   potassium chloride  20 mEq Oral Daily   sodium chloride flush  3 mL Intravenous Q12H   sodium chloride flush  3 mL Intravenous Q12H   traZODone  50 mg Oral QHS   Continuous Infusions:  sodium chloride     PRN Meds: sodium chloride, acetaminophen **OR** acetaminophen, ALPRAZolam, ondansetron **OR** ondansetron (ZOFRAN) IV, sodium chloride flush   Vital Signs    Vitals:   04/08/23 0905 04/08/23 2013 04/09/23 0446 04/09/23 0500  BP: 100/76 129/64 129/68   Pulse: 94 79 75   Resp:  16 18   Temp:  98.4 F (36.9 C) 97.8 F (36.6 C)   TempSrc:  Oral Oral   SpO2: 97% 95% 95%   Weight:    72.9 kg  Height:        Intake/Output Summary (Last 24 hours) at 04/09/2023 0828 Last data filed at 04/09/2023 0600 Gross per 24 hour  Intake 600 ml  Output 900 ml  Net -300 ml       04/09/2023    5:00 AM 04/08/2023    4:01 AM 04/07/2023    3:22 PM  Last 3 Weights  Weight (lbs) 160 lb 11.5 oz 157 lb 14.4 oz 163 lb 12.8 oz  Weight (kg) 72.9 kg 71.623 kg 74.3 kg      Telemetry    Sinus rhythm  ECG    None new  Physical Exam   GEN: Well nourished, well developed, in no acute distress  HEENT: normal  Neck: no JVD, carotid bruits, or masses Cardiac: RRR; no murmurs, rubs, or gallops,no  edema  Respiratory:  clear to auscultation bilaterally, normal work of breathing GI: soft, nontender, nondistended, + BS MS: no deformity or atrophy  Skin: warm and dry Neuro:  Strength and sensation are intact Psych: euthymic mood, full affect   Labs    High Sensitivity Troponin:   Recent Labs  Lab 04/07/23 0838 04/07/23 1136  TROPONINIHS 339* 786*      Chemistry Recent Labs  Lab 04/07/23 0838 04/08/23 1019  NA 135 135  K 5.0 3.7  CL 104 100  CO2 18* 23  GLUCOSE 315* 183*  BUN 22 21  CREATININE 1.39* 1.39*  CALCIUM 8.7* 8.9  MG  --  1.8  GFRNONAA 40* 40*  ANIONGAP 13 12     Lipids No results for input(s): "CHOL", "TRIG", "HDL", "LABVLDL", "LDLCALC", "CHOLHDL" in the last 168 hours.  Hematology Recent Labs  Lab 04/07/23 0838 04/08/23 1019 04/09/23 0241  WBC 14.4* 11.5* 8.9  RBC 3.73* 3.65* 3.46*  HGB 11.5* 10.8* 10.5*  HCT 34.7* 32.9* 31.6*  MCV 93.0  90.1 91.3  MCH 30.8 29.6 30.3  MCHC 33.1 32.8 33.2  RDW 13.5 13.6 13.6  PLT 206 212 213    Thyroid  Recent Labs  Lab 04/07/23 1546  TSH 2.254     BNP Recent Labs  Lab 04/07/23 0838  BNP 870.1*     DDimer No results for input(s): "DDIMER" in the last 168 hours.   Radiology    ECHOCARDIOGRAM COMPLETE  Result Date: 04/08/2023    ECHOCARDIOGRAM REPORT   Patient Name:   [name] Cobey Date of Exam: 04/08/2023 Medical Rec #:  [id number]         Height:       64.0 in Accession #:    [id number]        Weight:       157.9 lb Date of Birth:  Aug 02, 1946         BSA:          1.769 m Patient Age:    77 years          BP:           92/64 mmHg Patient Gender: F                 HR:           81 bpm. Exam Location:  Inpatient Procedure: 2D Echo, Cardiac Doppler, Color Doppler and Intracardiac            Opacification Agent Indications:    R07.9* Chest pain, unspecified  History:        Patient has no prior history of Echocardiogram examinations.                 CAD, Signs/Symptoms:Altered Mental Status and  Alzheimer's; Risk                 Factors:Diabetes, Dyslipidemia and Hypertension.  Sonographer:    Sheralyn Boatman RDCS Referring Phys: [phone] MICHAEL COOPER  Sonographer Comments: Technically difficult study due to poor echo windows. IMPRESSIONS  1. Left ventricular ejection fraction, by estimation, is 25 to 30%. The left ventricle has severely decreased function. The left ventricle demonstrates regional wall motion abnormalities (see scoring diagram/findings for description). Left ventricular diastolic parameters are consistent with Grade I diastolic dysfunction (impaired relaxation).  2. Right ventricular systolic function is normal. The right ventricular size is normal. There is normal pulmonary artery systolic pressure.  3. The mitral valve is abnormal. Moderate to severe mitral valve regurgitation. No evidence of mitral stenosis. Moderate mitral annular calcification.     ddds  4. The aortic valve was not well visualized. Aortic valve regurgitation is trivial. No aortic stenosis is present. FINDINGS  Left Ventricle: Left ventricular ejection fraction, by estimation, is 25 to 30%. The left ventricle has severely decreased function. The left ventricle demonstrates regional wall motion abnormalities. Definity contrast agent was given IV to delineate the left ventricular endocardial borders. The left ventricular internal cavity size was normal in size. There is no left ventricular hypertrophy. Left ventricular diastolic parameters are consistent with Grade I diastolic dysfunction (impaired relaxation).  LV Wall Scoring: The apical lateral segment and apex are akinetic. The inferior septum, apical anterior segment, and apical inferior segment are hypokinetic. Right Ventricle: The right ventricular size is normal. No increase in right ventricular wall thickness. Right ventricular systolic function is normal. There is normal pulmonary artery systolic pressure. The tricuspid regurgitant velocity is 2.13 m/s, and  with an  assumed right atrial pressure of 3 mmHg, the  estimated right ventricular systolic pressure is 21.1 mmHg. Left Atrium: Left atrial size was normal in size. Right Atrium: Right atrial size was normal in size. Pericardium: There is no evidence of pericardial effusion. Mitral Valve: The mitral valve is abnormal. Moderate mitral annular calcification. Moderate to severe mitral valve regurgitation. No evidence of mitral valve stenosis. MV peak gradient, 15.2 mmHg. The mean mitral valve gradient is 5.0 mmHg. Tricuspid Valve: The tricuspid valve is normal in structure. Tricuspid valve regurgitation is mild . No evidence of tricuspid stenosis. Aortic Valve: The aortic valve was not well visualized. Aortic valve regurgitation is trivial. No aortic stenosis is present. Pulmonic Valve: The pulmonic valve was not well visualized. Pulmonic valve regurgitation is not visualized. Aorta: The aortic root is normal in size and structure. IAS/Shunts: The interatrial septum was not assessed.  LEFT VENTRICLE PLAX 2D LVIDd:         5.20 cm      Diastology LVIDs:         4.70 cm      LV e' medial:    3.37 cm/s LV PW:         1.20 cm      LV E/e' medial:  38.4 LV IVS:        1.50 cm      LV e' lateral:   5.22 cm/s LVOT diam:     2.10 cm      LV E/e' lateral: 24.8 LV SV:         44 LV SV Index:   25 LVOT Area:     3.46 cm  LV Volumes (MOD) LV vol d, MOD A2C: 107.0 ml LV vol d, MOD A4C: 126.0 ml LV vol s, MOD A2C: 89.2 ml LV vol s, MOD A4C: 96.9 ml LV SV MOD A2C:     17.8 ml LV SV MOD A4C:     126.0 ml LV SV MOD BP:      23.3 ml RIGHT VENTRICLE            IVC RV S prime:     7.07 cm/s  IVC diam: 2.00 cm TAPSE (M-mode): 1.3 cm LEFT ATRIUM             Index        RIGHT ATRIUM          Index LA diam:        3.50 cm 1.98 cm/m   RA Area:     8.16 cm LA Vol (A2C):   46.9 ml 26.51 ml/m  RA Volume:   15.30 ml 8.65 ml/m LA Vol (A4C):   45.4 ml 25.66 ml/m LA Biplane Vol: 47.2 ml 26.68 ml/m  AORTIC VALVE LVOT Vmax:   68.70 cm/s LVOT Vmean:   44.700 cm/s LVOT VTI:    0.127 m  AORTA Ao Root diam: 3.10 cm Ao Asc diam:  3.30 cm MITRAL VALVE                TRICUSPID VALVE MV Area (PHT): 4.49 cm     TR Peak grad:   18.1 mmHg MV Area VTI:   1.17 cm     TR Vmax:        213.00 cm/s MV Peak grad:  15.2 mmHg MV Mean grad:  5.0 mmHg     SHUNTS MV Vmax:       1.95 m/s     Systemic VTI:  0.13 m MV Vmean:      105.0 cm/s   Systemic  Diam: 2.10 cm MV Decel Time: 169 msec MV E velocity: 129.50 cm/s MV A velocity: 155.50 cm/s MV E/A ratio:  0.83 Aditya Sabharwal Electronically signed by Dorthula Nettles Signature Date/Time: 04/08/2023/4:58:01 PM    Final    CARDIAC CATHETERIZATION  Result Date: 04/07/2023 1.  Severe proximal and moderate distal left main disease of 70 and 50% 2.  Severe mid LAD stenosis of 80% 3.  Severe ostial and mid circumflex stenoses of 80 and 90% 4.  Total occlusion of the mid RCA with left-to-right collaterals 5.  Severely elevated LVEDP of 29 mmHg The patient has severe calcific multivessel coronary artery disease including left main disease, total occlusion of the RCA, and severe stenoses in the LAD and circumflex vessels.  Due to comorbid conditions, she does not appear to be a candidate for CABG.  Favor medical therapy for coronary artery disease and continued diuresis for treatment of heart failure.  Further management and care per primary cardiology team.   CT Angio Chest/Abd/Pel for Dissection W and/or Wo Contrast  Result Date: 04/07/2023 CLINICAL DATA:  Three day history of intermittent chest pain associated with tachycardia EXAM: CT ANGIOGRAPHY CHEST, ABDOMEN AND PELVIS TECHNIQUE: Non-contrast CT of the chest was initially obtained. Multidetector CT imaging through the chest, abdomen and pelvis was performed using the standard protocol during bolus administration of intravenous contrast. Multiplanar reconstructed images and MIPs were obtained and reviewed to evaluate the vascular anatomy. RADIATION DOSE REDUCTION: This exam was  performed according to the departmental dose-optimization program which includes automated exposure control, adjustment of the mA and/or kV according to patient size and/or use of iterative reconstruction technique. CONTRAST:  OMNIPAQUE IOHEXOL 350 MG/ML SOLN COMPARISON:  Chest radiograph dated 04/07/2023 FINDINGS: CTA CHEST FINDINGS Cardiovascular: Preferential opacification of the thoracic aorta. No evidence of thoracic aortic aneurysm or dissection. Left atrial enlargement. No pericardial effusion. Anatomic variant common origin of the brachiocephalic and common carotid arteries. Severe stenosis of the proximal left subclavian artery due to atherosclerotic plaque. No pulmonary emboli to the level of the subsegmental arteries. Coronary artery calcifications. Mediastinum/Nodes: Imaged thyroid gland without nodules meeting criteria for imaging follow-up by size. Normal esophagus. No pathologically enlarged axillary, supraclavicular, mediastinal, or hilar lymph nodes. Lungs/Pleura: The central airways are patent. Mild bilateral interlobular septal thickening with bilateral perihilar peribronchovascular ground-glass densities. No pneumothorax. Trace bilateral pleural effusions, left-greater-than-right. Musculoskeletal: No acute or abnormal lytic or blastic osseous lesions. Review of the MIP images confirms the above findings. CTA ABDOMEN AND PELVIS FINDINGS VASCULAR Aorta: Aortic atherosclerosis. Normal caliber aorta without aneurysm, dissection, vasculitis or significant stenosis. Celiac: Patent without evidence of aneurysm, dissection, vasculitis or significant stenosis. SMA: Mild-to-moderate narrowing of the SMA origin due to atherosclerotic plaque. No evidence of aneurysm, dissection, or vasculitis. Renals: 2 right and 3 left renal arteries. Moderate narrowing of the dominant bilateral renal arteries due to atherosclerotic plaque. No evidence of aneurysm, dissection, or vasculitis. IMA: Patent without  evidence of aneurysm, dissection, vasculitis or significant stenosis. Inflow: Non opacification proximal common iliac arteries. Reconstitution immediately proximal to the bifurcation. Segmental mild luminal narrowing of the external and internal iliac arteries to atherosclerotic plaque. Proximal Outflow: Mild narrowing of the common femoral arteries due to atherosclerotic plaque. Visualized portions of the superficial and profunda femoral arteries are patent without evidence of aneurysm, dissection, vasculitis or significant stenosis. Veins: No obvious venous abnormality within the limitations of this arterial phase study. Review of the MIP images confirms the above findings. NON-VASCULAR Hepatobiliary: No focal hepatic lesions. No  intra or extrahepatic biliary ductal dilation. Normal gallbladder. Pancreas: No focal lesions or main ductal dilation. Spleen: Normal in size without focal abnormality. Adrenals/Urinary Tract: No adrenal nodules. No suspicious renal mass, calculi or hydronephrosis. No focal bladder wall thickening. Stomach/Bowel: Normal appearance of the stomach. No evidence of bowel wall thickening, distention, or inflammatory changes. Normal appendix. Lymphatic: No enlarged abdominal or pelvic lymph nodes. Reproductive: No adnexal masses. Other: No free fluid, fluid collection, or free air. Musculoskeletal: No acute or abnormal lytic or blastic osseous lesions. Review of the MIP images confirms the above findings. IMPRESSION: 1. No evidence of thoracic or abdominal aortic aneurysm or dissection. 2. Non opacification of the proximal common iliac arteries with reconstitution immediately proximal to the bifurcation. 3. Severe stenosis of the proximal left subclavian artery due to atherosclerotic plaque. 4. Mild bilateral interlobular septal thickening with bilateral perihilar peribronchovascular ground-glass densities, keeping with pulmonary edema. 5. Trace bilateral pleural effusions,  left-greater-than-right. 6. No pulmonary emboli to the level of the subsegmental arteries. 7. No acute findings in the abdomen or pelvis. 8. Aortic Atherosclerosis (ICD10-I70.0). Coronary artery calcifications. Assessment for potential risk factor modification, dietary therapy or pharmacologic therapy may be warranted, if clinically indicated. Electronically Signed   By: Agustin Cree M.D.   On: 04/07/2023 12:04   DG Chest Port 1 View  Result Date: 04/07/2023 CLINICAL DATA:  Three day history of intermittent chest pain EXAM: PORTABLE CHEST 1 VIEW COMPARISON:  None Available. FINDINGS: Normal lung volumes. Prominent bilateral perihilar vasculature. No pleural effusion or pneumothorax. Enlarged cardiomediastinal silhouette is likely projectional. No acute osseous abnormality. IMPRESSION: 1. Prominent bilateral perihilar vasculature, which may be seen in the setting of pulmonary edema. 2. Normal radiographic appearance of the partially imaged left shoulder. Electronically Signed   By: Agustin Cree M.D.   On: 04/07/2023 09:29    Cardiac Studies   Heart Catheterization 04/07/2023 1.  Severe proximal and moderate distal left main disease of 70 and 50% 2.  Severe mid LAD stenosis of 80% 3.  Severe ostial and mid circumflex stenoses of 80 and 90% 4.  Total occlusion of the mid RCA with left-to-right collaterals 5.  Severely elevated LVEDP of 29 mmHg   The patient has severe calcific multivessel coronary artery disease including left main disease, total occlusion of the RCA, and severe stenoses in the LAD and circumflex vessels.  Due to comorbid conditions, she does not appear to be a candidate for CABG.  Favor medical therapy for coronary artery disease and continued diuresis for treatment of heart failure.  Further management and care per primary cardiology team.  Patient Profile     77 y.o. female  with hx of dementia, HTN, HLD, DM, CVA, Carotid artery stenosis s/p left ICA stenting and angioplasty, right carotid  artery endarterectomy who presented for the evaluation of NSTEMI.  Underwent cardiac catheterization yesterday on 04/07/2023 and found to have multivessel disease not amenable to CABG due to comorbid conditions.  Assessment & Plan    1.  Non-STEMI: Has severe three-vessel coronary artery disease.  Due to comorbid conditions, no plans for CABG.  Miabella Shannahan treat medically.  Continue atorvastatin and Plavix.  2.  Systolic heart failure: Likely due to ischemic cardiomyopathy.  Currently on lisinopril and metoprolol.  Lexus Barletta consolidate metoprolol today.  No obvious volume overload.  Daiya Tamer continue Lasix 40 mg daily.  3.  Hypertension: Currently well-controlled  4.  Hyperlipidemia: Continue atorvastatin and fenofibrate  If she can ambulate, would be okay for discharge with her  current medications.  Dinisha Cai arrange for follow-up in cardiology clinic.  For questions or updates, please contact [facility] Please consult [email or web address] for contact info under        Signed, Karesha Trzcinski Jorja Loa, MD  04/09/2023, 8:28 AM

## 2023-04-09 NOTE — Discharge Instructions (Addendum)
Call [facility] at [phone] if any bleeding, swelling or drainage at cath site.  May shower, no tub baths for 48 hours for groin sticks. No lifting over 5 pounds for 3 days.  No Driving for 5 days if you drive   Heart Healthy Diet

## 2023-04-09 NOTE — Plan of Care (Signed)
Pt  to D/C home with family. Med rec/AVS reviewed with family and patient follow up questions answered. IV removed/Tele removed . BP 129/68 (BP Location: Right Arm)   Pulse 75   Temp 97.8 F (36.6 C) (Oral)   Resp 18   Ht 5\' 4"  (1.626 m)   Wt 72.9 kg   LMP 01/27/1983   SpO2 95%   BMI 27.59 kg/m  Pt taken off floor by staff.All belongings returned. Reva Bores 04/09/23 12:35 PM

## 2023-04-09 NOTE — Telephone Encounter (Signed)
   Transition of Care Follow-up Phone Call Request    Patient Name: Kylie Ward Date of Birth: 1946-03-20 Date of Encounter: 04/09/2023  Primary Care Provider:  Bennie Pierini, FNP Primary Cardiologist:  None  Kylie Ward needs to be scheduled for a transition of care follow up appointment with a [facility] provider: in 10-14 days and then phone call needs to be done.  Pt post cath with medical therapy.   Please reach out to Kylie Ward within 48 hours to confirm appointment and review transition of care protocol questionnaire.  Nada Boozer, NP  04/09/2023, 8:52 AM

## 2023-04-10 NOTE — Progress Notes (Signed)
Kindly see shared result

## 2023-04-11 LAB — CULTURE, BLOOD (ROUTINE X 2)

## 2023-04-11 NOTE — Telephone Encounter (Signed)
Patient contacted regarding discharge from  [facility] on 04/09/2023.  Patient understands to follow up with provider Jari Favre on 04/21/23 at 10:55 at [address]. Patient understands discharge instructions?Yes Patient understands medications and regiment? Yes Patient understands to bring all medications to this visit? Yes

## 2023-04-12 ENCOUNTER — Telehealth: Payer: Self-pay

## 2023-04-12 LAB — CULTURE, BLOOD (ROUTINE X 2): Special Requests: ADEQUATE

## 2023-04-12 LAB — LIPOPROTEIN A (LPA): Lipoprotein (a): 70.1 nmol/L — ABNORMAL HIGH (ref <75.0)

## 2023-04-12 NOTE — Transitions of Care (Post Inpatient/ED Visit) (Signed)
04/12/2023  Name: Kylie Ward MRN: [id number] DOB: [birth year]  Today's TOC FU Call Status: Today's TOC FU Call Status:: Successful TOC FU Call Competed TOC FU Call Complete Date: 04/12/23  Transition Care Management Follow-up Telephone Call Date of Discharge: 04/09/23 Discharge Facility: [facility]) Type of Discharge: Inpatient Admission Primary Inpatient Discharge Diagnosis:: NSEMI How have you been since you were released from the hospital?: Better Any questions or concerns?: No  Items Reviewed: Did you receive and understand the discharge instructions provided?: Yes Medications obtained,verified, and reconciled?: Yes (Medications Reviewed) Any new allergies since your discharge?: No Dietary orders reviewed?: Yes Type of Diet Ordered:: Diabetic Do you have support at home?: Yes People in Home: spouse Name of Support/Comfort Primary Source: [place]  Medications Reviewed Today: Medications Reviewed Today     Reviewed by Jodelle Gross, RN (Case Manager) on 04/12/23 at 1000  Med List Status: <None>   Medication Order Taking? Sig Documenting Provider Last Dose Status Informant  Accu-Chek FastClix Lancets MISC [id number] Yes CHECK BLOOD SUGAR UP TO 3 TIMES A DAY AS DIRECTED Dx N82.95 Bennie Pierini, FNP Taking Active Spouse/Significant Other, Pharmacy Records  acetaminophen (TYLENOL) 325 MG tablet [id number] Yes Take 2 tablets (650 mg total) by mouth every 6 (six) hours as needed for up to 7 days for mild pain (or Fever >/= 101). Barnetta Chapel, MD Taking Active   ALPRAZolam Prudy Feeler) 1 MG tablet [id number] Yes Take 1 tablet (1 mg total) by mouth 2 (two) times daily. Bennie Pierini, FNP Taking Active Spouse/Significant Other, Pharmacy Records  atorvastatin (LIPITOR) 40 MG tablet [id number] Yes Take 1 tablet (40 mg total) by mouth daily. Barnetta Chapel, MD Taking Active   Blood Glucose Monitoring Suppl (ACCU-CHEK NANO SMARTVIEW) w/Device Andria Rhein [id number]  No CHECK BLOOD SUGER UP TO 3 TIMES A DAY Daphine Deutscher, Mary-Margaret, FNP Unknown Active Spouse/Significant Other, Pharmacy Records  clopidogrel (PLAVIX) 75 MG tablet [id number] Yes Take 1 tablet (75 mg total) by mouth daily. Barnetta Chapel, MD Taking Active   Continuous Blood Gluc Receiver (FREESTYLE LIBRE 2 READER) DEVI [id number] Yes Use to test blood sugar continuously. DX: E11.65 Bennie Pierini, FNP Taking Active Spouse/Significant Other, Pharmacy Records  Continuous Blood Gluc Sensor (FREESTYLE LIBRE 2 SENSOR) [place] [id number] Yes Use to test blood sugar continuously. Place new sensor on back of the arm every 14 days as directed.  DX: E11.65 Bennie Pierini, FNP Taking Active Spouse/Significant Other, Pharmacy Records  donepezil (ARICEPT) 10 MG tablet [id number] Yes Take 1 tablet (10 mg total) by mouth at bedtime.  Patient taking differently: Take 10 mg by mouth daily.   Bennie Pierini, FNP Taking Active Spouse/Significant Other, Pharmacy Records  Elastic Bandages & Supports (V-2 HIGH COMPRESSION HOSE) MISC [id number] No 1 each by Does not apply route daily. Bennie Pierini, FNP Unknown Active Spouse/Significant Other, Pharmacy Records  escitalopram (LEXAPRO) 20 MG tablet [id number] Yes Take 1 tablet (20 mg total) by mouth daily. Bennie Pierini, FNP Taking Active Spouse/Significant Other, Pharmacy Records  fenofibrate 160 MG tablet [id number] No Take 1 tablet (160 mg total) by mouth daily.  Patient not taking: Reported on 04/07/2023   Bennie Pierini, FNP Not Taking Active Spouse/Significant Other, Pharmacy Records  furosemide (LASIX) 40 MG tablet [id number] Yes Take 1 tablet (40 mg total) by mouth daily.  Patient taking differently: Take 40 mg by mouth daily as needed for fluid.   Bennie Pierini, FNP Taking Active Spouse/Significant Other, Pharmacy Records  glucose blood (ACCU-CHEK SMARTVIEW) test strip [id number]  CHECK BLOOD  SUGAR UP TO 3 TIMES A DAY AS  DIRECTED Daphine Deutscher, Mary-Margaret, FNP  Active Spouse/Significant Other, Pharmacy Records  insulin aspart (NOVOLOG) 100 UNIT/ML injection [id number] No Inject 0-10 Units into the skin See admin instructions. 0-10 units per sliding scale, three times daily before meals. [provider] Unknown Active Spouse/Significant Other, Pharmacy Records           Med Note (COFFELL, Herb Grays Apr 07, 2023  4:26 PM) Pt's husband unable to recall the sliding scale. No fill history found for this medication.  insulin glargine-yfgn (SEMGLEE) 100 UNIT/ML injection [id number] Yes Inject 0.3 mLs (30 Units total) into the skin at bedtime. Barnetta Chapel, MD Taking Active   Insulin Pen Needle (SURE COMFORT PEN NEEDLES) 31G X 8 MM MISC [id number] Yes USE TO inject lantus ONCE daily Bennie Pierini, FNP Taking Active Spouse/Significant Other, Pharmacy Records  lisinopril (ZESTRIL) 10 MG tablet [id number] Yes Take 1 tablet (10 mg total) by mouth daily. Barnetta Chapel, MD Taking Active   memantine (NAMENDA) 10 MG tablet [id number] Yes TAKE ONE TABLET BY MOUTH TWICE DAILY Daphine Deutscher Mary-Margaret, FNP Taking Active Spouse/Significant Other, Pharmacy Records  metoprolol succinate (TOPROL-XL) 25 MG 24 hr tablet [id number] Yes Take 1 tablet (25 mg total) by mouth daily. Barnetta Chapel, MD Taking Active   Multiple Vitamins-Minerals (ONE-A-DAY WOMENS 50+) TABS [id number] Yes Take 1 tablet by mouth daily. [provider] Taking Active Spouse/Significant Other  traZODone (DESYREL) 50 MG tablet [id number] Yes Take 1 tablet (50 mg total) by mouth at bedtime. Bennie Pierini, FNP Taking Active Spouse/Significant Other, Pharmacy Records            Home Care and Equipment/Supplies: Were Home Health Services Ordered?: No Any new equipment or medical supplies ordered?: No  Functional Questionnaire: Do you need assistance with bathing/showering or dressing?: No Do you need assistance with meal  preparation?: Yes Do you need assistance with eating?: No Do you have difficulty maintaining continence: No Do you need assistance with getting out of bed/getting out of a chair/moving?: No Do you have difficulty managing or taking your medications?: Yes (spouse assists)  Follow up appointments reviewed: PCP Follow-up appointment confirmed?: NA Specialist Hospital Follow-up appointment confirmed?: Yes Date of Specialist follow-up appointment?: 04/21/23 Follow-Up Specialty Provider:: Jari Favre, Cardiology Do you need transportation to your follow-up appointment?: No Do you understand care options if your condition(s) worsen?: Yes-patient verbalized understanding  SDOH Interventions Today    Flowsheet Row Most Recent Value  SDOH Interventions   Food Insecurity Interventions Intervention Not Indicated  Housing Interventions Intervention Not Indicated  Transportation Interventions Intervention Not Indicated  Utilities Interventions Intervention Not Indicated      Jodelle Gross, RN, BSN, CCM Care Management Coordinator [facility] Phone: [phone]/Fax: [phone]

## 2023-04-20 NOTE — Progress Notes (Unsigned)
Cardiology Office Note:  .   Date:  04/21/2023  ID:  Kylie Ward, DOB [birth year], MRN [id number] PCP: Bennie Pierini, FNP  [facility] Providers Cardiologist:  None {  History of Present Illness: .   Kylie Ward is a 77 y.o. female with past medical history of diabetes mellitus, hypertension, hyperlipidemia, carotid artery disease, CVA, and anxiety who presents today for hospital follow-up.  On presentation to the ED EKG revealed old LBBB with sinus tach, troponin was slightly elevated, CXR showed pulmonary edema.  Patient was admitted to the hospital for further assessment and management.  Started on heparin.  Lab work revealed blood sugar 315, creatinine 1.39, troponin 339>> 786, BNP 870 procalcitonin 0.14, leukocytosis 14.4 UA with WBC 6-10 small LE and nitrate positive.  CT angio of the chest is negative for aneurysm/dissection but revealed severe stenosis of the proximal left subclavian artery due to atherosclerotic plaque, no PE, aortic atherosclerosis, evidence of pulmonary edema.  Cardiac catheterization done 04/07/2023 revealed severe calcified multivessel coronary artery disease including left main disease, total occlusion of RCA and severe restenosis in the LAD and circumflex vessel.  Not a candidate for CABG advised medical therapy and CHF management.  Was discharged on aspirin 81 mg, Lipitor 40 mg, fibrate 160 mg, lisinopril 20 mg.  Today, she tells me she has been doing well since discharge.  No issues with chest pain or shortness of breath.  She has not had any issues with her catheterization site.  Plan is for medication management.  We reviewed her echocardiogram which showed very low LVEF of 25 to 30%.  We discussed referral to the heart failure clinic.  Not fluid overloaded on exam today thankfully.  Tolerating all medications.  She also has issues with subclavian stenosis as well as carotid stenosis.  We have also sent a referral to Dr. Randie Ward for vascular  surgery referral.  We have arranged for a follow-up echocardiogram.  Reports no shortness of breath nor dyspnea on exertion. Reports no chest pain, pressure, or tightness. No edema, orthopnea, PND. Reports no palpitations.     ROS: Pertinent ROS as in the HPI  Studies Reviewed: .      Echocardiogram 04/08/2023  IMPRESSIONS     1. Left ventricular ejection fraction, by estimation, is 25 to 30%. The  left ventricle has severely decreased function. The left ventricle  demonstrates regional wall motion abnormalities (see scoring  diagram/findings for description). Left ventricular  diastolic parameters are consistent with Grade I diastolic dysfunction  (impaired relaxation).   2. Right ventricular systolic function is normal. The right ventricular  size is normal. There is normal pulmonary artery systolic pressure.   3. The mitral valve is abnormal. Moderate to severe mitral valve  regurgitation. No evidence of mitral stenosis. Moderate mitral annular  calcification.     ddds   4. The aortic valve was not well visualized. Aortic valve regurgitation  is trivial. No aortic stenosis is present.    No EKG ordered today.      Physical Exam:   VS:  BP 118/60   Pulse (!) 56   Ht 5\' 4"  (1.626 m)   Wt 156 lb (70.8 kg)   LMP 01/27/1983   SpO2 95%   BMI 26.78 kg/m    Wt Readings from Last 3 Encounters:  04/21/23 156 lb (70.8 kg)  04/09/23 160 lb 11.5 oz (72.9 kg)  01/04/23 163 lb (73.9 kg)    GEN: Well nourished, well developed in no acute  distress NECK: No JVD; No carotid bruits CARDIAC: RRR, no murmurs, rubs, gallops RESPIRATORY:  Clear to auscultation without rales, wheezing or rhonchi  ABDOMEN: Soft, non-tender, non-distended EXTREMITIES:  No edema; No deformity   ASSESSMENT AND PLAN: .   1.  NSTEMI with medical management, CAD with severe calcified multivessel disease -No further chest pain -continue current medications which include: Lipitor 40 mg daily, Plavix 75 mg  daily, Lasix 40 mg daily, lisinopril 10 mg daily, metoprolol succinate 25 mg daily -Okay for cardiac rehab  2.  Acute systolic CHF/bilateral pleural effusion -EF 25 to 30%, referral to heart failure made today -Ordered repeat echocardiogram in a few months to reassess EF -Lungs are clear to auscultation today -No lower extremity edema, euvolemic -Continue current diuretic regimen -Continue daily weights and low-sodium diet  3.  Hyperlipidemia -Recent DL is 161 -Goal would be less than 70 -Repeat lipid panel and LFTs today since patient is fasting  4.  Diabetes mellitus type 2 -A1c 9.5, uncontrolled -She can go back on her metformin 750 mg twice daily and was referred to primary for titration  5.  PAD/carotid stenosis/subclavian stenosis -she does not see a vascular surgeon, referral today -+ bruit left carotid   6.  AKI versus CKD -Recent creatinine 1.39 -Continue to avoid nephrotoxic drugs -Will need follow-up labs when she comes back to visit     Cardiac Rehabilitation Eligibility Assessment  The patient is ready to start cardiac rehabilitation from a cardiac standpoint.      Dispo: She can follow-up with 3 months to see Dr. Excell Seltzer or myself  Signed, Kylie Dory, PA-C

## 2023-04-21 ENCOUNTER — Encounter: Payer: Self-pay | Admitting: Physician Assistant

## 2023-04-21 ENCOUNTER — Ambulatory Visit: Payer: Medicare Other | Attending: Physician Assistant | Admitting: Physician Assistant

## 2023-04-21 VITALS — BP 118/60 | HR 56 | Ht 64.0 in | Wt 156.0 lb

## 2023-04-21 DIAGNOSIS — N179 Acute kidney failure, unspecified: Secondary | ICD-10-CM

## 2023-04-21 DIAGNOSIS — I255 Ischemic cardiomyopathy: Secondary | ICD-10-CM

## 2023-04-21 DIAGNOSIS — I10 Essential (primary) hypertension: Secondary | ICD-10-CM

## 2023-04-21 DIAGNOSIS — I6529 Occlusion and stenosis of unspecified carotid artery: Secondary | ICD-10-CM

## 2023-04-21 DIAGNOSIS — D72829 Elevated white blood cell count, unspecified: Secondary | ICD-10-CM | POA: Diagnosis not present

## 2023-04-21 DIAGNOSIS — I2510 Atherosclerotic heart disease of native coronary artery without angina pectoris: Secondary | ICD-10-CM

## 2023-04-21 DIAGNOSIS — I739 Peripheral vascular disease, unspecified: Secondary | ICD-10-CM

## 2023-04-21 DIAGNOSIS — I5041 Acute combined systolic (congestive) and diastolic (congestive) heart failure: Secondary | ICD-10-CM

## 2023-04-21 DIAGNOSIS — E785 Hyperlipidemia, unspecified: Secondary | ICD-10-CM

## 2023-04-21 LAB — LIPID PANEL
Chol/HDL Ratio: 4.5 ratio — ABNORMAL HIGH (ref 0.0–4.4)
Cholesterol, Total: 176 mg/dL (ref 100–199)
HDL: 39 mg/dL — ABNORMAL LOW (ref >39)
LDL Chol Calc (NIH): 104 mg/dL — ABNORMAL HIGH (ref 0–99)
Triglycerides: 191 mg/dL — ABNORMAL HIGH (ref 0–149)
VLDL Cholesterol Cal: 33 mg/dL (ref 5–40)

## 2023-04-21 LAB — HEPATIC FUNCTION PANEL
ALT: 17 IU/L (ref 0–32)
AST: 21 IU/L (ref 0–40)
Albumin: 4.1 g/dL (ref 3.8–4.8)
Alkaline Phosphatase: 90 IU/L (ref 44–121)
Bilirubin Total: 0.3 mg/dL (ref 0.0–1.2)
Bilirubin, Direct: 0.11 mg/dL (ref 0.00–0.40)
Total Protein: 6.7 g/dL (ref 6.0–8.5)

## 2023-04-21 MED ORDER — CLOPIDOGREL BISULFATE 75 MG PO TABS: 75.0000 mg | ORAL_TABLET | Freq: Every day | ORAL | 3 refills | Status: DC

## 2023-04-21 MED ORDER — METOPROLOL SUCCINATE ER 25 MG PO TB24: 25.0000 mg | ORAL_TABLET | Freq: Every day | ORAL | 3 refills | Status: DC

## 2023-04-21 MED ORDER — ATORVASTATIN CALCIUM 40 MG PO TABS: 40.0000 mg | ORAL_TABLET | Freq: Every day | ORAL | 3 refills | Status: DC

## 2023-04-21 NOTE — Patient Instructions (Addendum)
Medication Instructions:  Your physician recommends that you continue on your current medications as directed. Please refer to the Current Medication list given to you today.  *If you need a refill on your cardiac medications before your next appointment, please call your pharmacy*  Lab Work: Lipids, lft's-TODAY If you have labs (blood work) drawn today and your tests are completely normal, you will receive your results only by: MyChart Message (if you have MyChart) OR A paper copy in the mail If you have any lab test that is abnormal or we need to change your treatment, we will call you to review the results.   Testing/Procedures: Your physician has requested that you have an echocardiogram in 3 months. Echocardiography is a painless test that uses sound waves to create images of your heart. It provides your doctor with information about the size and shape of your heart and how well your heart's chambers and valves are working. This procedure takes approximately one hour. There are no restrictions for this procedure. Please do NOT wear cologne, perfume, aftershave, or lotions (deodorant is allowed). Please arrive 15 minutes prior to your appointment time.    Follow-Up: At [facility], you and your health needs are our priority.  As part of our continuing mission to provide you with exceptional heart care, we have created designated Provider Care Teams.  These Care Teams include your primary Cardiologist (physician) and Advanced Practice Providers (APPs -  Physician Assistants and Nurse Practitioners) who all work together to provide you with the care you need, when you need it.   Your next appointment:   3-4 month(s)  Provider:   Dr Excell Seltzer  Other Instructions You have been referred to Dr Randie Heinz at [facility]. You have been referred to [facility].

## 2023-05-02 ENCOUNTER — Ambulatory Visit: Payer: Medicare Other | Admitting: Nurse Practitioner

## 2023-05-03 ENCOUNTER — Encounter: Payer: Self-pay | Admitting: Nurse Practitioner

## 2023-05-03 ENCOUNTER — Ambulatory Visit (INDEPENDENT_AMBULATORY_CARE_PROVIDER_SITE_OTHER): Payer: Medicare Other | Admitting: Nurse Practitioner

## 2023-05-03 VITALS — BP 159/84 | HR 68 | Temp 98.0°F | Resp 20 | Ht 64.0 in | Wt 158.0 lb

## 2023-05-03 DIAGNOSIS — G8929 Other chronic pain: Secondary | ICD-10-CM | POA: Insufficient documentation

## 2023-05-03 DIAGNOSIS — E1169 Type 2 diabetes mellitus with other specified complication: Secondary | ICD-10-CM

## 2023-05-03 DIAGNOSIS — I10 Essential (primary) hypertension: Secondary | ICD-10-CM | POA: Diagnosis not present

## 2023-05-03 DIAGNOSIS — Z794 Long term (current) use of insulin: Secondary | ICD-10-CM | POA: Diagnosis not present

## 2023-05-03 DIAGNOSIS — E1142 Type 2 diabetes mellitus with diabetic polyneuropathy: Secondary | ICD-10-CM | POA: Diagnosis not present

## 2023-05-03 DIAGNOSIS — F0390 Unspecified dementia without behavioral disturbance: Secondary | ICD-10-CM

## 2023-05-03 DIAGNOSIS — M545 Low back pain, unspecified: Secondary | ICD-10-CM | POA: Diagnosis not present

## 2023-05-03 DIAGNOSIS — I6523 Occlusion and stenosis of bilateral carotid arteries: Secondary | ICD-10-CM | POA: Diagnosis not present

## 2023-05-03 DIAGNOSIS — F5101 Primary insomnia: Secondary | ICD-10-CM

## 2023-05-03 DIAGNOSIS — F411 Generalized anxiety disorder: Secondary | ICD-10-CM

## 2023-05-03 DIAGNOSIS — I25110 Atherosclerotic heart disease of native coronary artery with unstable angina pectoris: Secondary | ICD-10-CM | POA: Diagnosis not present

## 2023-05-03 DIAGNOSIS — R600 Localized edema: Secondary | ICD-10-CM | POA: Diagnosis not present

## 2023-05-03 DIAGNOSIS — E782 Mixed hyperlipidemia: Secondary | ICD-10-CM

## 2023-05-03 DIAGNOSIS — E785 Hyperlipidemia, unspecified: Secondary | ICD-10-CM | POA: Diagnosis not present

## 2023-05-03 DIAGNOSIS — Z6831 Body mass index (BMI) 31.0-31.9, adult: Secondary | ICD-10-CM

## 2023-05-03 LAB — BAYER DCA HB A1C WAIVED: HB A1C (BAYER DCA - WAIVED): 9.3 % — ABNORMAL HIGH (ref 4.8–5.6)

## 2023-05-03 MED ORDER — ATORVASTATIN CALCIUM 40 MG PO TABS
40.0000 mg | ORAL_TABLET | Freq: Every day | ORAL | 3 refills | Status: DC
Start: 2023-05-03 — End: 2024-01-19

## 2023-05-03 MED ORDER — FUROSEMIDE 40 MG PO TABS
40.0000 mg | ORAL_TABLET | Freq: Every day | ORAL | 1 refills | Status: DC
Start: 2023-05-03 — End: 2023-10-21

## 2023-05-03 MED ORDER — FENOFIBRATE 160 MG PO TABS
160.0000 mg | ORAL_TABLET | Freq: Every day | ORAL | 1 refills | Status: DC
Start: 2023-05-03 — End: 2023-10-21

## 2023-05-03 MED ORDER — INSULIN ASPART 100 UNIT/ML IJ SOLN
0.0000 [IU] | INTRAMUSCULAR | 2 refills | Status: DC
Start: 2023-05-03 — End: 2024-01-19

## 2023-05-03 MED ORDER — DONEPEZIL HCL 10 MG PO TABS: 10.0000 mg | ORAL_TABLET | Freq: Every day | ORAL | 1 refills | Status: DC

## 2023-05-03 MED ORDER — TRAZODONE HCL 50 MG PO TABS
50.0000 mg | ORAL_TABLET | Freq: Every day | ORAL | 2 refills | Status: DC
Start: 2023-05-03 — End: 2023-08-05

## 2023-05-03 MED ORDER — LANTUS SOLOSTAR 100 UNIT/ML ~~LOC~~ SOPN
35.0000 [IU] | PEN_INJECTOR | Freq: Every day | SUBCUTANEOUS | 99 refills | Status: DC
Start: 2023-05-03 — End: 2023-08-05

## 2023-05-03 MED ORDER — CLOPIDOGREL BISULFATE 75 MG PO TABS
75.0000 mg | ORAL_TABLET | Freq: Every day | ORAL | 3 refills | Status: DC
Start: 2023-05-03 — End: 2024-01-19

## 2023-05-03 MED ORDER — ESCITALOPRAM OXALATE 20 MG PO TABS: 20.0000 mg | ORAL_TABLET | Freq: Every day | ORAL | 1 refills | Status: DC

## 2023-05-03 MED ORDER — ALPRAZOLAM 1 MG PO TABS: 1.0000 mg | ORAL_TABLET | Freq: Two times a day (BID) | ORAL | 5 refills | Status: DC

## 2023-05-03 MED ORDER — TRAMADOL HCL 50 MG PO TABS
50.0000 mg | ORAL_TABLET | Freq: Three times a day (TID) | ORAL | 0 refills | Status: AC | PRN
Start: 2023-05-03 — End: 2023-05-23

## 2023-05-03 NOTE — Progress Notes (Signed)
Subjective:    Patient ID: Kylie Ward, female    DOB: [birth year], 77 y.o.   MRN: [id number]   Chief Complaint: medical management of chronic issues     HPI:  Kylie Ward is a 77 y.o. who identifies as a female who was assigned female at birth.   Social history: Lives with: husband Work history: retired   Water engineer in today for follow up of the following chronic medical issues:  1. Essential hypertension, benign No c/o chest pain, sob or headache. Does not check blood pressure at home. BP Readings from Last 3 Encounters:  04/21/23 118/60  04/09/23 129/68  01/04/23 128/66    .  2. Coronary artery disease involving native coronary artery of native heart with unstable angina pectoris (HCC) Had a nonstemi NI on 04/07/23 and had to have a cath. With angiplasty. She came home on 04/12/23. Had follow up with cardiology on 04/21/23. She is to have an echo cardiogram in 3 months, then she will follow up backup withthem to discuss results.  3. Type 2 diabetes mellitus with diabetic polyneuropathy, with long-term current use of insulin (HCC) Fasting blood sugars have been running around 150-180. We increased her trulicity at last visit. The hospital stooped her metformin and insulin when discharged. So she went without it until she follow up with cardiology. They started it back Lab Results  Component Value Date   HGBA1C 9.5 (H) 04/07/2023     4. Hyperlipidemia associated with type 2 diabetes mellitus (HCC) 5. Mixed hyperlipidemia Eats whatever her husband fixes her to eat. Has a very poor appetite.  6. Peripheral edema Has occasional swelling of bil lower ext.  7. Dementia without behavioral disturbance [facility]) Husband says she is worsening  8. Symptomatic stenosis of both carotid arteries Last doppler was done 11/03/22. Stent on left in place. Right has stenosis and may need further studies. Family has refused further studies.  9. Insomnia Is on trazadone and that helps  some.   10. BMI 31.0-31.9,adult Wt Readings from Last 3 Encounters:  05/03/23 158 lb (71.7 kg)  04/21/23 156 lb (70.8 kg)  04/09/23 160 lb 11.5 oz (72.9 kg)   BMI Readings from Last 3 Encounters:  05/03/23 27.12 kg/m  04/21/23 26.78 kg/m  04/09/23 27.59 kg/m      New complaints: Having chronic back pain- she takes ultram prn and is out.   Allergies  Allergen Reactions   Crestor [Rosuvastatin] Other (See Comments)    Myalgias and arthralgias to the point she had difficulty walking. She was tolerant of lipitor from the family's recall of events.    Niaspan [Niacin Er] Itching    Skin burning   Outpatient Encounter Medications as of 05/03/2023  Medication Sig   Accu-Chek FastClix Lancets MISC CHECK BLOOD SUGAR UP TO 3 TIMES A DAY AS DIRECTED Dx E11.42   ALPRAZolam (XANAX) 1 MG tablet Take 1 tablet (1 mg total) by mouth 2 (two) times daily.   atorvastatin (LIPITOR) 40 MG tablet Take 1 tablet (40 mg total) by mouth daily.   Blood Glucose Monitoring Suppl (ACCU-CHEK NANO SMARTVIEW) w/Device KIT CHECK BLOOD SUGER UP TO 3 TIMES A DAY   clopidogrel (PLAVIX) 75 MG tablet Take 1 tablet (75 mg total) by mouth daily.   Continuous Blood Gluc Receiver (FREESTYLE LIBRE 2 READER) DEVI Use to test blood sugar continuously. DX: E11.65   Continuous Blood Gluc Sensor (FREESTYLE LIBRE 2 SENSOR) MISC Use to test blood sugar continuously. Place new sensor on  back of the arm every 14 days as directed.  DX: E11.65   donepezil (ARICEPT) 10 MG tablet Take 1 tablet (10 mg total) by mouth at bedtime. (Patient taking differently: Take 10 mg by mouth daily.)   Elastic Bandages & Supports (V-2 HIGH COMPRESSION HOSE) MISC 1 each by Does not apply route daily.   escitalopram (LEXAPRO) 20 MG tablet Take 1 tablet (20 mg total) by mouth daily.   fenofibrate 160 MG tablet Take 1 tablet (160 mg total) by mouth daily.   furosemide (LASIX) 40 MG tablet Take 1 tablet (40 mg total) by mouth daily. (Patient taking  differently: Take 40 mg by mouth daily as needed for fluid.)   glucose blood (ACCU-CHEK SMARTVIEW) test strip CHECK BLOOD SUGAR UP TO 3 TIMES A DAY AS DIRECTED   insulin aspart (NOVOLOG) 100 UNIT/ML injection Inject 0-10 Units into the skin See admin instructions. 0-10 units per sliding scale, three times daily before meals.   insulin glargine-yfgn (SEMGLEE) 100 UNIT/ML injection Inject 0.3 mLs (30 Units total) into the skin at bedtime.   Insulin Pen Needle (SURE COMFORT PEN NEEDLES) 31G X 8 MM MISC USE TO inject lantus ONCE daily   lisinopril (ZESTRIL) 10 MG tablet Take 1 tablet (10 mg total) by mouth daily.   memantine (NAMENDA) 10 MG tablet TAKE ONE TABLET BY MOUTH TWICE DAILY   metFORMIN (GLUCOPHAGE-XR) 750 MG 24 hr tablet Take 750 mg by mouth 2 (two) times daily.   metoprolol succinate (TOPROL-XL) 25 MG 24 hr tablet Take 1 tablet (25 mg total) by mouth daily.   Multiple Vitamins-Minerals (ONE-A-DAY WOMENS 50+) TABS Take 1 tablet by mouth daily.   traZODone (DESYREL) 50 MG tablet Take 1 tablet (50 mg total) by mouth at bedtime.   No facility-administered encounter medications on file as of 05/03/2023.    Past Surgical History:  Procedure Laterality Date   CAROTID ENDARTERECTOMY Right    CAROTID STENT     LEFT HEART CATH AND CORONARY ANGIOGRAPHY N/A 04/07/2023   Procedure: LEFT HEART CATH AND CORONARY ANGIOGRAPHY;  Surgeon: Tonny Bollman, MD;  Location: [facility] INVASIVE CV LAB;  Service: Cardiovascular;  Laterality: N/A;    Family History  Problem Relation Age of Onset   Heart disease Father    Heart attack Father    Hypertension Father    Diabetes Sister    Stroke Sister    Diabetes Brother    Diabetes Sister    Diabetes Sister    Diabetes Brother    Diabetes Brother    Diabetes Brother       Controlled substance contract: n/a     Review of Systems  Constitutional:  Negative for diaphoresis.  Eyes:  Negative for pain.  Respiratory:  Negative for shortness of breath.    Cardiovascular:  Negative for chest pain, palpitations and leg swelling.  Gastrointestinal:  Negative for abdominal pain.  Endocrine: Negative for polydipsia.  Skin:  Negative for rash.  Neurological:  Negative for dizziness, weakness and headaches.  Hematological:  Does not bruise/bleed easily.  All other systems reviewed and are negative.      Objective:   Physical Exam Vitals and nursing note reviewed.  Constitutional:      General: She is not in acute distress.    Appearance: Normal appearance. She is well-developed.  HENT:     Head: Normocephalic.     Right Ear: Tympanic membrane normal.     Left Ear: Tympanic membrane normal.     Nose: Nose normal.  Mouth/Throat:     Mouth: Mucous membranes are moist.  Eyes:     Pupils: Pupils are equal, round, and reactive to light.  Neck:     Vascular: No carotid bruit or JVD.  Cardiovascular:     Rate and Rhythm: Normal rate and regular rhythm.     Heart sounds: Normal heart sounds.  Pulmonary:     Effort: Pulmonary effort is normal. No respiratory distress.     Breath sounds: Normal breath sounds. No wheezing or rales.  Chest:     Chest wall: No tenderness.  Abdominal:     General: Bowel sounds are normal. There is no distension or abdominal bruit.     Palpations: Abdomen is soft. There is no hepatomegaly, splenomegaly, mass or pulsatile mass.     Tenderness: There is no abdominal tenderness.  Musculoskeletal:        General: Normal range of motion.     Cervical back: Normal range of motion and neck supple.  Lymphadenopathy:     Cervical: No cervical adenopathy.  Skin:    General: Skin is warm and dry.  Neurological:     Mental Status: She is alert and oriented to person, place, and time.     Deep Tendon Reflexes: Reflexes are normal and symmetric.  Psychiatric:        Behavior: Behavior normal.        Thought Content: Thought content normal.        Judgment: Judgment normal.    BP (!) 159/84   Pulse 68   Temp  98 F (36.7 C) (Temporal)   Resp 20   Ht 5\' 4"  (1.626 m)   Wt 158 lb (71.7 kg)   LMP 01/27/1983   SpO2 94%   BMI 27.12 kg/m   Hgaba1c 9.3%      Assessment & Plan:  VERTINA MEAS comes in today with chief complaint of Medical Management of Chronic Issues   Diagnosis and orders addressed:  1. Essential hypertension, benign Low sodium diet - CBC with Differential/Platelet - CMP14+EGFR  2. Coronary artery disease involving native coronary artery of native heart with unstable angina pectoris (HCC) Keep follow up with cardiology - clopidogrel (PLAVIX) 75 MG tablet; Take 1 tablet (75 mg total) by mouth daily.  Dispense: 90 tablet; Refill: 3  3. Type 2 diabetes mellitus with diabetic polyneuropathy, with long-term current use of insulin (HCC) Stricter carb counting Back on all diabetes meds - Bayer DCA Hb A1c Waived - insulin aspart (NOVOLOG) 100 UNIT/ML injection; Inject 0-10 Units into the skin See admin instructions. 0-10 units per sliding scale, three times daily before meals.  Dispense: 10 mL; Refill: 2 - insulin glargine (LANTUS SOLOSTAR) 100 UNIT/ML Solostar Pen; Inject 35 Units into the skin daily.  Dispense: 15 mL; Refill: PRN  4. Hyperlipidemia associated with type 2 diabetes mellitus (HCC) Low fat diet - Lipid panel - fenofibrate 160 MG tablet; Take 1 tablet (160 mg total) by mouth daily.  Dispense: 90 tablet; Refill: 1 - atorvastatin (LIPITOR) 40 MG tablet; Take 1 tablet (40 mg total) by mouth daily.  Dispense: 90 tablet; Refill: 3  5. Mixed hyperlipidemia  6. Peripheral edema Elevate legs when sitting - furosemide (LASIX) 40 MG tablet; Take 1 tablet (40 mg total) by mouth daily.  Dispense: 90 tablet; Refill: 1  7. Dementia without behavioral disturbance (HCC) [place] daily - donepezil (ARICEPT) 10 MG tablet; Take 1 tablet (10 mg total) by mouth at bedtime.  Dispense: 90 tablet; Refill: 1  8. Symptomatic stenosis of both carotid arteries Not surgery  candidiate 9. BMI 31.0-31.9,adult Discussed diet and exercise for person with BMI >25 Will recheck weight in 3-6 months   10. GAD (generalized anxiety disorder) Stress management - ALPRAZolam (XANAX) 1 MG tablet; Take 1 tablet (1 mg total) by mouth 2 (two) times daily.  Dispense: 60 tablet; Refill: 5 - escitalopram (LEXAPRO) 20 MG tablet; Take 1 tablet (20 mg total) by mouth daily.  Dispense: 90 tablet; Refill: 1  11. Primary insomnia Bedtime routine - traZODone (DESYREL) 50 MG tablet; Take 1 tablet (50 mg total) by mouth at bedtime.  Dispense: 30 tablet; Refill: 2  12. Chronic midline low back pain without sciatica DO NOT GIVE ULTRAM ABD TRAZADONE - traMADol (ULTRAM) 50 MG tablet; Take 1 tablet (50 mg total) by mouth every 8 (eight) hours as needed for up to 20 days.  Dispense: 60 tablet; Refill: 0   Labs pending Health Maintenance reviewed Diet and exercise encouraged  Follow up plan: 3 months   [name], FNP

## 2023-05-03 NOTE — Patient Instructions (Signed)

## 2023-05-04 LAB — CMP14+EGFR
ALT: 17 IU/L (ref 0–32)
AST: 23 IU/L (ref 0–40)
Albumin: 4.2 g/dL (ref 3.8–4.8)
Alkaline Phosphatase: 88 IU/L (ref 44–121)
BUN/Creatinine Ratio: 16 (ref 12–28)
BUN: 26 mg/dL (ref 8–27)
Bilirubin Total: 0.2 mg/dL (ref 0.0–1.2)
CO2: 19 mmol/L — ABNORMAL LOW (ref 20–29)
Calcium: 9.7 mg/dL (ref 8.7–10.3)
Chloride: 97 mmol/L (ref 96–106)
Creatinine, Ser: 1.62 mg/dL — ABNORMAL HIGH (ref 0.57–1.00)
Globulin, Total: 2.5 g/dL (ref 1.5–4.5)
Glucose: 235 mg/dL — ABNORMAL HIGH (ref 70–99)
Potassium: 4.6 mmol/L (ref 3.5–5.2)
Sodium: 138 mmol/L (ref 134–144)
Total Protein: 6.7 g/dL (ref 6.0–8.5)
eGFR: 33 mL/min/{1.73_m2} — ABNORMAL LOW (ref >59)

## 2023-05-04 LAB — CBC WITH DIFFERENTIAL/PLATELET
Basophils Absolute: 0.1 10*3/uL (ref 0.0–0.2)
Basos: 1 %
EOS (ABSOLUTE): 0.2 10*3/uL (ref 0.0–0.4)
Eos: 2 %
Hematocrit: 36.2 % (ref 34.0–46.6)
Hemoglobin: 11.8 g/dL (ref 11.1–15.9)
Immature Grans (Abs): 0 10*3/uL (ref 0.0–0.1)
Immature Granulocytes: 0 %
Lymphocytes Absolute: 1.7 10*3/uL (ref 0.7–3.1)
Lymphs: 16 %
MCH: 29.6 pg (ref 26.6–33.0)
MCHC: 32.6 g/dL (ref 31.5–35.7)
MCV: 91 fL (ref 79–97)
Monocytes Absolute: 0.8 10*3/uL (ref 0.1–0.9)
Monocytes: 8 %
Neutrophils Absolute: 7.9 10*3/uL — ABNORMAL HIGH (ref 1.4–7.0)
Neutrophils: 73 %
Platelets: 256 10*3/uL (ref 150–450)
RBC: 3.98 x10E6/uL (ref 3.77–5.28)
RDW: 13.5 % (ref 11.7–15.4)
WBC: 10.6 10*3/uL (ref 3.4–10.8)

## 2023-05-04 LAB — LIPID PANEL
Chol/HDL Ratio: 4.2 ratio (ref 0.0–4.4)
Cholesterol, Total: 162 mg/dL (ref 100–199)
HDL: 39 mg/dL — ABNORMAL LOW (ref >39)
LDL Chol Calc (NIH): 62 mg/dL (ref 0–99)
Triglycerides: 398 mg/dL — ABNORMAL HIGH (ref 0–149)
VLDL Cholesterol Cal: 61 mg/dL — ABNORMAL HIGH (ref 5–40)

## 2023-05-19 ENCOUNTER — Telehealth (HOSPITAL_COMMUNITY): Payer: Self-pay | Admitting: Internal Medicine

## 2023-06-15 ENCOUNTER — Other Ambulatory Visit: Payer: Self-pay

## 2023-06-15 ENCOUNTER — Ambulatory Visit (INDEPENDENT_AMBULATORY_CARE_PROVIDER_SITE_OTHER): Payer: Medicare Other | Admitting: Family Medicine

## 2023-06-15 ENCOUNTER — Ambulatory Visit (INDEPENDENT_AMBULATORY_CARE_PROVIDER_SITE_OTHER): Payer: Medicare Other

## 2023-06-15 ENCOUNTER — Encounter: Payer: Self-pay | Admitting: Family Medicine

## 2023-06-15 VITALS — BP 103/66 | HR 62 | Temp 97.6°F

## 2023-06-15 DIAGNOSIS — W19XXXA Unspecified fall, initial encounter: Secondary | ICD-10-CM | POA: Diagnosis not present

## 2023-06-15 DIAGNOSIS — K5900 Constipation, unspecified: Secondary | ICD-10-CM

## 2023-06-15 DIAGNOSIS — M8580 Other specified disorders of bone density and structure, unspecified site: Secondary | ICD-10-CM | POA: Diagnosis not present

## 2023-06-15 DIAGNOSIS — M160 Bilateral primary osteoarthritis of hip: Secondary | ICD-10-CM | POA: Diagnosis not present

## 2023-06-15 DIAGNOSIS — M25552 Pain in left hip: Secondary | ICD-10-CM

## 2023-06-15 MED ORDER — POLYETHYLENE GLYCOL 3350 17 GM/SCOOP PO POWD
17.0000 g | Freq: Every day | ORAL | 1 refills | Status: AC
Start: 2023-06-15 — End: ?

## 2023-06-15 NOTE — Patient Instructions (Signed)
Tylenol three times daily

## 2023-06-15 NOTE — Progress Notes (Signed)
Subjective:  Patient ID: Kylie Ward, female    DOB: April 22, 1946, 77 y.o.   MRN: [id number]  Patient Care Team: Bennie Pierini, FNP as PCP - General (Nurse Practitioner) Sheran Luz, MD as Consulting Physician (Physical Medicine and Rehabilitation) Danella Maiers, [facility] as Pharmacist (Family Medicine)   Chief Complaint:  Hip Pain (Left - after a fall x 5 days )   HPI: Kylie Ward is a 77 y.o. female presenting on 06/15/2023 for Hip Pain (Left - after a fall x 5 days )   Husband reports pt fell on Friday while walking with her walker. States she has complained of lower back pain and left hip pain since the fall. She has been able to bear weight since fall. Tylenol has been beneficial for the pain.   Hip Pain  The incident occurred 5 to 7 days ago. The incident occurred at home. The injury mechanism was a fall. The pain is present in the left hip. She reports no foreign bodies present. She has tried acetaminophen for the symptoms. The treatment provided moderate relief.       Relevant past medical, surgical, family, and social history reviewed and updated as indicated.  Allergies and medications reviewed and updated. Data reviewed: Chart in Epic.   Past Medical History:  Diagnosis Date   Anxiety    Diabetes mellitus without complication (HCC)    Hyperlipidemia    Hypertension    Stroke [facility])     Past Surgical History:  Procedure Laterality Date   CAROTID ENDARTERECTOMY Right    CAROTID STENT     LEFT HEART CATH AND CORONARY ANGIOGRAPHY N/A 04/07/2023   Procedure: LEFT HEART CATH AND CORONARY ANGIOGRAPHY;  Surgeon: Tonny Bollman, MD;  Location: [facility] INVASIVE CV LAB;  Service: Cardiovascular;  Laterality: N/A;    Social History   Socioeconomic History   Marital status: Married    Spouse name: Animal nutritionist   Number of children: 1   Years of education: 10   Highest education level: 10th grade  Occupational History   Occupation: retired    Associate Professor: [employer]  Tobacco Use   Smoking status: Former    Current packs/day: 0.00    Types: Cigarettes    Quit date: 12/02/2009    Years since quitting: 13.5   Smokeless tobacco: Never  Vaping Use   Vaping status: Never Used  Substance and Sexual Activity   Alcohol use: No   Drug use: No   Sexual activity: Yes    Birth control/protection: Post-menopausal  Other Topics Concern   Not on file  Social History Narrative   One level living with her husband   Social Determinants of Health   Financial Resource Strain: Low Risk  (05/10/2022)   Overall Financial Resource Strain (CARDIA)    Difficulty of Paying Living Expenses: Not hard at all  Food Insecurity: No Food Insecurity (04/12/2023)   Hunger Vital Sign    Worried About Running Out of Food in the Last Year: Never true    Ran Out of Food in the Last Year: Never true  Transportation Needs: No Transportation Needs (04/12/2023)   PRAPARE - Administrator, Civil Service (Medical): No    Lack of Transportation (Non-Medical): No  Physical Activity: Inactive (05/10/2022)   Exercise Vital Sign    Days of Exercise per Week: 0 days    Minutes of Exercise per Session: 0 min  Stress: No Stress Concern Present (05/10/2022)   [employer]  of Occupational Health - Occupational Stress Questionnaire    Feeling of Stress : Only a little  Social Connections: Socially Isolated (05/07/2021)   Social Connection and Isolation Panel [NHANES]    Frequency of Communication with Friends and Family: Once a week    Frequency of Social Gatherings with Friends and Family: Once a week    Attends Religious Services: Never    Database administrator or Organizations: No    Attends Banker Meetings: Never    Marital Status: Married  Catering manager Violence: Not At Risk (04/07/2023)   Humiliation, Afraid, Rape, and Kick questionnaire    Fear of Current or Ex-Partner: No    Emotionally Abused: No    Physically Abused: No    Sexually Abused: No     Outpatient Encounter Medications as of 06/15/2023  Medication Sig   Accu-Chek FastClix Lancets MISC CHECK BLOOD SUGAR UP TO 3 TIMES A DAY AS DIRECTED Dx E11.42   ALPRAZolam (XANAX) 1 MG tablet Take 1 tablet (1 mg total) by mouth 2 (two) times daily.   atorvastatin (LIPITOR) 40 MG tablet Take 1 tablet (40 mg total) by mouth daily.   Blood Glucose Monitoring Suppl (ACCU-CHEK NANO SMARTVIEW) w/Device KIT CHECK BLOOD SUGER UP TO 3 TIMES A DAY   clopidogrel (PLAVIX) 75 MG tablet Take 1 tablet (75 mg total) by mouth daily.   Continuous Blood Gluc Receiver (FREESTYLE LIBRE 2 READER) DEVI Use to test blood sugar continuously. DX: E11.65   Continuous Blood Gluc Sensor (FREESTYLE LIBRE 2 SENSOR) MISC Use to test blood sugar continuously. Place new sensor on back of the arm every 14 days as directed.  DX: E11.65   donepezil (ARICEPT) 10 MG tablet Take 1 tablet (10 mg total) by mouth at bedtime.   Elastic Bandages & Supports (V-2 HIGH COMPRESSION HOSE) MISC 1 each by Does not apply route daily.   escitalopram (LEXAPRO) 20 MG tablet Take 1 tablet (20 mg total) by mouth daily.   fenofibrate 160 MG tablet Take 1 tablet (160 mg total) by mouth daily.   furosemide (LASIX) 40 MG tablet Take 1 tablet (40 mg total) by mouth daily.   glucose blood (ACCU-CHEK SMARTVIEW) test strip CHECK BLOOD SUGAR UP TO 3 TIMES A DAY AS DIRECTED   insulin aspart (NOVOLOG) 100 UNIT/ML injection Inject 0-10 Units into the skin See admin instructions. 0-10 units per sliding scale, three times daily before meals.   insulin glargine (LANTUS SOLOSTAR) 100 UNIT/ML Solostar Pen Inject 35 Units into the skin daily.   Insulin Pen Needle (SURE COMFORT PEN NEEDLES) 31G X 8 MM MISC USE TO inject lantus ONCE daily   memantine (NAMENDA) 10 MG tablet TAKE ONE TABLET BY MOUTH TWICE DAILY   metFORMIN (GLUCOPHAGE-XR) 750 MG 24 hr tablet Take 750 mg by mouth 2 (two) times daily.   metoprolol succinate (TOPROL-XL) 25 MG 24 hr tablet Take 1 tablet  (25 mg total) by mouth daily.   Multiple Vitamins-Minerals (ONE-A-DAY WOMENS 50+) TABS Take 1 tablet by mouth daily.   polyethylene glycol powder (GLYCOLAX/MIRALAX) 17 GM/SCOOP powder Take 17 g by mouth daily.   traZODone (DESYREL) 50 MG tablet Take 1 tablet (50 mg total) by mouth at bedtime.   lisinopril (ZESTRIL) 10 MG tablet Take 1 tablet (10 mg total) by mouth daily.   No facility-administered encounter medications on file as of 06/15/2023.    Allergies  Allergen Reactions   Crestor [Rosuvastatin] Other (See Comments)    Myalgias and arthralgias to  the point she had difficulty walking. She was tolerant of lipitor from the family's recall of events.    Niaspan [Niacin Er] Itching    Skin burning    Review of Systems  Unable to perform ROS: Dementia (ROS per husband and pt)  Musculoskeletal:  Positive for arthralgias, back pain and gait problem.        Objective:  BP 103/66   Pulse 62   Temp 97.6 F (36.4 C) (Temporal)   LMP 01/27/1983   SpO2 98%    Wt Readings from Last 3 Encounters:  05/03/23 158 lb (71.7 kg)  04/21/23 156 lb (70.8 kg)  04/09/23 160 lb 11.5 oz (72.9 kg)    Physical Exam Vitals and nursing note reviewed.  Constitutional:      General: She is not in acute distress.    Appearance: Normal appearance. She is obese. She is not ill-appearing, toxic-appearing or diaphoretic.  HENT:     Head: Normocephalic and atraumatic.     Nose: Nose normal.     Mouth/Throat:     Mouth: Mucous membranes are moist.  Eyes:     Pupils: Pupils are equal, round, and reactive to light.  Cardiovascular:     Rate and Rhythm: Normal rate and regular rhythm.     Pulses: Normal pulses.     Heart sounds: Normal heart sounds.  Pulmonary:     Effort: Pulmonary effort is normal.     Breath sounds: Normal breath sounds.  Musculoskeletal:     Thoracic back: Normal.     Lumbar back: Decreased range of motion.     Left hip: Tenderness present. No deformity, lacerations, bony  tenderness or crepitus. Decreased range of motion. Normal strength.     Left upper leg: Normal.     Right lower leg: 1+ Edema present.     Left lower leg: 1+ Edema present.  Skin:    General: Skin is warm and dry.     Capillary Refill: Capillary refill takes less than 2 seconds.  Neurological:     General: No focal deficit present.     Mental Status: She is alert. Mental status is at baseline.     Gait: Gait abnormal (in wheelchair).     Results for orders placed or performed in visit on 05/03/23  Bayer DCA Hb A1c Waived  Result Value Ref Range   HB A1C (BAYER DCA - WAIVED) 9.3 (H) 4.8 - 5.6 %  CBC with Differential/Platelet  Result Value Ref Range   WBC 10.6 3.4 - 10.8 x10E3/uL   RBC 3.98 3.77 - 5.28 x10E6/uL   Hemoglobin 11.8 11.1 - 15.9 g/dL   Hematocrit 91.4 78.2 - 46.6 %   MCV 91 79 - 97 fL   MCH 29.6 26.6 - 33.0 pg   MCHC 32.6 31.5 - 35.7 g/dL   RDW 95.6 21.3 - 08.6 %   Platelets 256 150 - 450 x10E3/uL   Neutrophils 73 Not Estab. %   Lymphs 16 Not Estab. %   Monocytes 8 Not Estab. %   Eos 2 Not Estab. %   Basos 1 Not Estab. %   Neutrophils Absolute 7.9 (H) 1.4 - 7.0 x10E3/uL   Lymphocytes Absolute 1.7 0.7 - 3.1 x10E3/uL   Monocytes Absolute 0.8 0.1 - 0.9 x10E3/uL   EOS (ABSOLUTE) 0.2 0.0 - 0.4 x10E3/uL   Basophils Absolute 0.1 0.0 - 0.2 x10E3/uL   Immature Granulocytes 0 Not Estab. %   Immature Grans (Abs) 0.0 0.0 - 0.1 x10E3/uL  CMP14+EGFR  Result Value Ref Range   Glucose 235 (H) 70 - 99 mg/dL   BUN 26 8 - 27 mg/dL   Creatinine, Ser 6.57 (H) 0.57 - 1.00 mg/dL   eGFR 33 (L) >84 ON/GEX/5.28   BUN/Creatinine Ratio 16 12 - 28   Sodium 138 134 - 144 mmol/L   Potassium 4.6 3.5 - 5.2 mmol/L   Chloride 97 96 - 106 mmol/L   CO2 19 (L) 20 - 29 mmol/L   Calcium 9.7 8.7 - 10.3 mg/dL   Total Protein 6.7 6.0 - 8.5 g/dL   Albumin 4.2 3.8 - 4.8 g/dL   Globulin, Total 2.5 1.5 - 4.5 g/dL   Bilirubin Total 0.2 0.0 - 1.2 mg/dL   Alkaline Phosphatase 88 44 - 121 IU/L    AST 23 0 - 40 IU/L   ALT 17 0 - 32 IU/L  Lipid panel  Result Value Ref Range   Cholesterol, Total 162 100 - 199 mg/dL   Triglycerides 413 (H) 0 - 149 mg/dL   HDL 39 (L) >24 mg/dL   VLDL Cholesterol Cal 61 (H) 5 - 40 mg/dL   LDL Chol Calc (NIH) 62 0 - 99 mg/dL   Chol/HDL Ratio 4.2 0.0 - 4.4 ratio     X-Ray: left hip: No acute findings. Degenerative changes of bilateral hips. Significant stool burden. Preliminary x-ray reading by Kari Baars, FNP-C, [facility].   Pertinent labs & imaging results that were available during my care of the patient were reviewed by me and considered in my medical decision making.  Assessment & Plan:  Kylie Ward was seen today for hip pain.  Diagnoses and all orders for this visit:  Fall, initial encounter Fall precautions discussed in detail.  Pain of left hip No acute findings on imaging. Noted degenerative changes. Will notify pt if radiology reading differs. Symptomatic care discussed in detail. Can take Tylenol up to three times daily as needed for pain. Report new, worsening, or persistent symptoms.  -     DG Hip Unilat W OR W/O Pelvis 2-3 Views Left; Future  Constipation in female Noted on imaging. Will add below. Pt aware to increase water and fiber intake.  -     polyethylene glycol powder (GLYCOLAX/MIRALAX) 17 GM/SCOOP powder; Take 17 g by mouth daily.     Continue all other maintenance medications.  Follow up plan: Return if symptoms worsen or fail to improve.   Continue healthy lifestyle choices, including diet (rich in fruits, vegetables, and lean proteins, and low in salt and simple carbohydrates) and exercise (at least 30 minutes of moderate physical activity daily).  Educational handout given for hip pain  The above assessment and management plan was discussed with the patient. The patient verbalized understanding of and has agreed to the management plan. Patient is aware to call the clinic if they develop any new symptoms or if symptoms  persist or worsen. Patient is aware when to return to the clinic for a follow-up visit. Patient educated on when it is appropriate to go to the emergency department.   Kari Baars, FNP-C [facility] [phone]

## 2023-06-23 ENCOUNTER — Other Ambulatory Visit: Payer: Self-pay

## 2023-06-23 DIAGNOSIS — I6529 Occlusion and stenosis of unspecified carotid artery: Secondary | ICD-10-CM

## 2023-06-24 ENCOUNTER — Telehealth: Payer: Self-pay | Admitting: Nurse Practitioner

## 2023-06-30 ENCOUNTER — Other Ambulatory Visit: Payer: Self-pay | Admitting: Nurse Practitioner

## 2023-06-30 DIAGNOSIS — E1142 Type 2 diabetes mellitus with diabetic polyneuropathy: Secondary | ICD-10-CM

## 2023-07-06 ENCOUNTER — Encounter: Payer: Self-pay | Admitting: Vascular Surgery

## 2023-07-06 ENCOUNTER — Encounter (HOSPITAL_COMMUNITY): Payer: Medicare Other | Admitting: Cardiology

## 2023-07-06 ENCOUNTER — Ambulatory Visit (HOSPITAL_COMMUNITY)
Admission: RE | Admit: 2023-07-06 | Discharge: 2023-07-06 | Disposition: A | Discharge status: Home / Self Care | Payer: Medicare Other | Source: Ambulatory Visit | Attending: Vascular Surgery | Admitting: Vascular Surgery

## 2023-07-06 ENCOUNTER — Ambulatory Visit: Payer: Medicare Other | Admitting: Vascular Surgery

## 2023-07-06 VITALS — BP 80/53 | HR 73 | Temp 98.0°F | Resp 20 | Ht 64.0 in | Wt 158.0 lb

## 2023-07-06 DIAGNOSIS — I6529 Occlusion and stenosis of unspecified carotid artery: Secondary | ICD-10-CM | POA: Diagnosis not present

## 2023-07-06 DIAGNOSIS — I6523 Occlusion and stenosis of bilateral carotid arteries: Secondary | ICD-10-CM | POA: Diagnosis not present

## 2023-07-06 NOTE — Progress Notes (Signed)
Patient ID: Kylie Ward, female   DOB: Nov 17, 1945, 77 y.o.   MRN: [id number]  Reason for Consult: New Patient (Initial Visit)   Referred by Daphine Deutscher, Mary-Margaret, *  Subjective:     HPI:  Kylie Ward is a 77 y.o. female history of right carotid enterectomy and left carotid artery stent performed represcribe this hospital about 12 years ago.  She denies any history of stroke, TIA or amaurosis.  According to her family she is suffering with dementia.  She is followed by cardiology with plans for evaluation of the heart failure clinic next week.  She was recently found to have a bruit and carotid duplex was performed which she now follows up for evaluation.  She is on Plavix and a statin drug.  Past Medical History:  Diagnosis Date   Anxiety    Diabetes mellitus without complication (HCC)    Hyperlipidemia    Hypertension    Stroke (HCC)    Family History  Problem Relation Age of Onset   Heart disease Father    Heart attack Father    Hypertension Father    Diabetes Sister    Stroke Sister    Diabetes Brother    Diabetes Sister    Diabetes Sister    Diabetes Brother    Diabetes Brother    Diabetes Brother    Past Surgical History:  Procedure Laterality Date   CAROTID ENDARTERECTOMY Right    CAROTID STENT     LEFT HEART CATH AND CORONARY ANGIOGRAPHY N/A 04/07/2023   Procedure: LEFT HEART CATH AND CORONARY ANGIOGRAPHY;  Surgeon: Tonny Bollman, MD;  Location: [facility] INVASIVE CV LAB;  Service: Cardiovascular;  Laterality: N/A;    Short Social History:  Social History   Tobacco Use   Smoking status: Former    Current packs/day: 0.00    Types: Cigarettes    Quit date: 12/02/2009    Years since quitting: 13.6   Smokeless tobacco: Never  Substance Use Topics   Alcohol use: No    Allergies  Allergen Reactions   Crestor [Rosuvastatin] Other (See Comments)    Myalgias and arthralgias to the point she had difficulty walking. She was tolerant of lipitor from the  family's recall of events.    Niaspan [Niacin Er] Itching    Skin burning    Current Outpatient Medications  Medication Sig Dispense Refill   Accu-Chek FastClix Lancets MISC CHECK BLOOD SUGAR UP TO 3 TIMES A DAY AS DIRECTED Dx E11.42 306 each 3   ALPRAZolam (XANAX) 1 MG tablet Take 1 tablet (1 mg total) by mouth 2 (two) times daily. 60 tablet 5   atorvastatin (LIPITOR) 40 MG tablet Take 1 tablet (40 mg total) by mouth daily. 90 tablet 3   Blood Glucose Monitoring Suppl (ACCU-CHEK NANO SMARTVIEW) w/Device KIT CHECK BLOOD SUGER UP TO 3 TIMES A DAY 1 kit 0   clopidogrel (PLAVIX) 75 MG tablet Take 1 tablet (75 mg total) by mouth daily. 90 tablet 3   Continuous Blood Gluc Receiver (FREESTYLE LIBRE 2 READER) DEVI Use to test blood sugar continuously. DX: E11.65 1 each 0   Continuous Glucose Sensor (FREESTYLE LIBRE 2 SENSOR) MISC CHANGE SENSOR ON BACK OF ARM EVERY 14 DAYS AS DIRECTED TO CHECK BLOOD SUGAR CONTINUOUSLY 6 each 3   donepezil (ARICEPT) 10 MG tablet Take 1 tablet (10 mg total) by mouth at bedtime. 90 tablet 1   Elastic Bandages & Supports (V-2 HIGH COMPRESSION HOSE) MISC 1 each by Does not apply  route daily. 1 each 0   escitalopram (LEXAPRO) 20 MG tablet Take 1 tablet (20 mg total) by mouth daily. 90 tablet 1   fenofibrate 160 MG tablet Take 1 tablet (160 mg total) by mouth daily. 90 tablet 1   furosemide (LASIX) 40 MG tablet Take 1 tablet (40 mg total) by mouth daily. 90 tablet 1   glucose blood (ACCU-CHEK SMARTVIEW) test strip CHECK BLOOD SUGAR UP TO 3 TIMES A DAY AS DIRECTED 100 strip 3   insulin aspart (NOVOLOG) 100 UNIT/ML injection Inject 0-10 Units into the skin See admin instructions. 0-10 units per sliding scale, three times daily before meals. 10 mL 2   insulin glargine (LANTUS SOLOSTAR) 100 UNIT/ML Solostar Pen Inject 35 Units into the skin daily. 15 mL PRN   Insulin Pen Needle (SURE COMFORT PEN NEEDLES) 31G X 8 MM MISC USE TO inject lantus ONCE daily 100 each 2   memantine  (NAMENDA) 10 MG tablet TAKE ONE TABLET BY MOUTH TWICE DAILY 180 tablet 0   metFORMIN (GLUCOPHAGE-XR) 750 MG 24 hr tablet Take 750 mg by mouth 2 (two) times daily.     metoprolol succinate (TOPROL-XL) 25 MG 24 hr tablet Take 1 tablet (25 mg total) by mouth daily. 90 tablet 3   Multiple Vitamins-Minerals (ONE-A-DAY WOMENS 50+) TABS Take 1 tablet by mouth daily.     polyethylene glycol powder (GLYCOLAX/MIRALAX) 17 GM/SCOOP powder Take 17 g by mouth daily. 3350 g 1   traZODone (DESYREL) 50 MG tablet Take 1 tablet (50 mg total) by mouth at bedtime. 30 tablet 2   lisinopril (ZESTRIL) 10 MG tablet Take 1 tablet (10 mg total) by mouth daily. 30 tablet 0   No current facility-administered medications for this visit.    Review of Systems  Constitutional:  Constitutional negative. HENT: HENT negative.  Eyes: Eyes negative.  Respiratory: Respiratory negative.  Cardiovascular: Cardiovascular negative.  GI: Gastrointestinal negative.  Musculoskeletal: Musculoskeletal negative.  Skin: Skin negative.  Neurological: Positive for focal weakness and numbness.  Hematologic: Positive for bruises/bleeds easily.  Psychiatric: Psychiatric negative.        Objective:  Objective   Vitals:   07/06/23 1511 07/06/23 1515  BP: (!) 153/61 (!) 80/53  Pulse: 73   Resp: 20   Temp: 98 F (36.7 C)   SpO2: 93%   Weight: 158 lb (71.7 kg)   Height: 5\' 4"  (1.626 m)    Body mass index is 27.12 kg/m.  Physical Exam Constitutional:      Comments: Seated in a wheelchair  Eyes:     Pupils: Pupils are equal, round, and reactive to light.  Cardiovascular:     Rate and Rhythm: Normal rate.     Pulses:          Carotid pulses are 2+ on the right side and 2+ on the left side.      Radial pulses are 2+ on the right side and 2+ on the left side.  Pulmonary:     Effort: Pulmonary effort is normal.  Abdominal:     General: Abdomen is flat.  Skin:    General: Skin is warm.     Capillary Refill: Capillary refill  takes less than 2 seconds.  Neurological:     Mental Status: She is alert. Mental status is at baseline.  Psychiatric:        Mood and Affect: Mood normal.        Thought Content: Thought content normal.  Judgment: Judgment normal.     Data: Right Carotid Findings:  +----------+--------+--------+--------+------------------+--------+           PSV cm/sEDV cm/sStenosisPlaque DescriptionComments  +----------+--------+--------+--------+------------------+--------+  CCA Prox  177     11              heterogenous                +----------+--------+--------+--------+------------------+--------+  CCA Mid   127     13              heterogenous                +----------+--------+--------+--------+------------------+--------+  CCA Distal135     17              heterogenous                +----------+--------+--------+--------+------------------+--------+  ICA Prox  109     19      1-39%                               +----------+--------+--------+--------+------------------+--------+  ICA Mid   99      22                                          +----------+--------+--------+--------+------------------+--------+  ICA Distal69      18                                          +----------+--------+--------+--------+------------------+--------+  ECA      339     21      >50%                                +----------+--------+--------+--------+------------------+--------+   +----------+--------+-------+--------+-------------------+           PSV cm/sEDV cmsDescribeArm Pressure (mmHG)  +----------+--------+-------+--------+-------------------+  [id number]    4      Stenotic135                  +----------+--------+-------+--------+-------------------+   +---------+--------+--+--------+----------------------+  VertebralPSV cm/s13EDV cm/sAntegrade and dampened   +---------+--------+--+--------+----------------------+      Left Carotid Findings:  +----------+--------+--------+--------+--------------------------+--------+            PSV cm/sEDV cm/sStenosisPlaque Description         Comments  +----------+--------+--------+--------+--------------------------+--------+   CCA Prox  82      14                                                   +----------+--------+--------+--------+--------------------------+--------+   CCA Mid   107     23              heterogenous                         +----------+--------+--------+--------+--------------------------+--------+   CCA Distal88      17              irregular and heterogenous           +----------+--------+--------+--------+--------------------------+--------+  ICA Prox                                                    stent      +----------+--------+--------+--------+--------------------------+--------+   ICA Distal98      23                                                   +----------+--------+--------+--------+--------------------------+--------+   ECA      388     17      >50%                                         +----------+--------+--------+--------+--------------------------+--------+    +----------+--------+--------+----------+-------------------+           PSV cm/sEDV cm/sDescribe  Arm Pressure (mmHG)  +----------+--------+--------+----------+-------------------+  [id number]    9       Monophasic33                   +----------+--------+--------+----------+-------------------+   +---------+--------+--+--------+-+----------+  VertebralPSV cm/s71EDV cm/s3Retrograde  +---------+--------+--+--------+-+----------+      Left Stent(s):  +---------------+---+--++++  Prox to Stent  90 18  +---------------+---+--++++  Proximal Stent 10316  +---------------+---+--++++  Mid Stent       10721  +---------------+---+--++++  Distal Stent   14824  +---------------+---+--++++  Distal to [id number]  +---------------+---+--++++             Summary:  Right Carotid: Velocities in the right ICA are consistent with a 1-39%  stenosis.                The ECA appears >50% stenosed.   Left Carotid: Velocities in the left ICA are consistent with a 1-39%  stenosis.               The ECA appears >50% stenosed. The carotid stent appears  patent               with no visualized stenosis.   Vertebrals:  Right vertebral artery demonstrates antegrade flow. Left  vertebral              artery demonstrates retrograde flow.  Subclavians: Right subclavian artery was stenotic. Left subclavian artery               monophasic.      Assessment/Plan:    77yo female with a history of bilateral carotid artery intervention right-sided CVA and left-sided stent both of which appear patent 12 years after the fact.  As such she can follow-up in 2 years with repeat carotid duplex.     Maeola Harman MD [facility]

## 2023-07-07 ENCOUNTER — Encounter (HOSPITAL_COMMUNITY): Payer: Self-pay | Admitting: Physician Assistant

## 2023-07-07 ENCOUNTER — Ambulatory Visit (HOSPITAL_COMMUNITY): Payer: Medicare Other

## 2023-07-08 ENCOUNTER — Encounter: Payer: Self-pay | Admitting: Nurse Practitioner

## 2023-07-08 ENCOUNTER — Ambulatory Visit (INDEPENDENT_AMBULATORY_CARE_PROVIDER_SITE_OTHER): Payer: Medicare Other | Admitting: Nurse Practitioner

## 2023-07-08 VITALS — BP 150/75 | HR 65 | Temp 97.7°F | Resp 20

## 2023-07-08 DIAGNOSIS — L03115 Cellulitis of right lower limb: Secondary | ICD-10-CM | POA: Diagnosis not present

## 2023-07-08 DIAGNOSIS — L03116 Cellulitis of left lower limb: Secondary | ICD-10-CM | POA: Diagnosis not present

## 2023-07-08 MED ORDER — SULFAMETHOXAZOLE-TRIMETHOPRIM 800-160 MG PO TABS: 1.0000 | ORAL_TABLET | Freq: Two times a day (BID) | ORAL | 0 refills | Status: DC

## 2023-07-08 NOTE — Progress Notes (Signed)
Subjective:    Patient ID: Kylie Ward, female    DOB: [birth year], 77 y.o.   MRN: [id number]   Chief Complaint: redness and swelling bilateral lower legs   HPI  Patient brought in by husband with lower ext red a swollen. Started several days ago and is getting no better. Patient Active Problem List   Diagnosis Date Noted   Primary insomnia 05/03/2023   Chronic midline low back pain without sciatica 05/03/2023   Coronary artery disease involving native coronary artery of native heart with unstable angina pectoris (HCC) 04/08/2023   Mixed hyperlipidemia 04/08/2023   Dementia without behavioral disturbance (HCC) 04/07/2023   Drug-induced myopathy 10/30/2020   Type 2 diabetes mellitus with diabetic polyneuropathy, with long-term current use of insulin (HCC) 03/06/2018   Atherosclerosis of aorta (HCC) 09/04/2016   Peripheral edema 11/13/2015   GAD (generalized anxiety disorder) 08/12/2015   BMI 31.0-31.9,adult 05/08/2015   Carotid artery stenosis, symptomatic 01/14/2014   Late effect of cerebrovascular accident (CVA) 12/10/2013   Hyperlipidemia with target LDL less than 100 01/26/2013   Essential hypertension, benign 01/26/2013       Review of Systems  Cardiovascular:  Positive for leg swelling.  All other systems reviewed and are negative.      Objective:   Physical Exam Vitals and nursing note reviewed.  Constitutional:      General: She is not in acute distress.    Appearance: Normal appearance. She is well-developed.  Neck:     Vascular: No carotid bruit or JVD.  Cardiovascular:     Rate and Rhythm: Normal rate and regular rhythm.     Heart sounds: Normal heart sounds.  Pulmonary:     Effort: Pulmonary effort is normal. No respiratory distress.     Breath sounds: Normal breath sounds. No wheezing or rales.  Chest:     Chest wall: No tenderness.  Abdominal:     General: Bowel sounds are normal. There is no distension or abdominal bruit.     Palpations:  Abdomen is soft. There is no hepatomegaly, splenomegaly, mass or pulsatile mass.     Tenderness: There is no abdominal tenderness.  Musculoskeletal:        General: Normal range of motion.     Cervical back: Normal range of motion and neck supple.     Right lower leg: Edema (3+) present.     Left lower leg: Edema (3-) present.  Lymphadenopathy:     Cervical: No cervical adenopathy.  Skin:    General: Skin is warm and dry.     Comments: BIL LOWER EXT ERYTHEMATOUS WITH SCATTERED VESICULAR LESIONS.  Neurological:     Mental Status: She is alert and oriented to person, place, and time.     Deep Tendon Reflexes: Reflexes are normal and symmetric.  Psychiatric:        Behavior: Behavior normal.        Thought Content: Thought content normal.    BP (!) 150/75   Pulse 65   Temp 97.7 F (36.5 C) (Temporal)   Resp 20   LMP 01/27/1983   SpO2 92%         Assessment & Plan:  Kylie Ward in today with chief complaint of redness and swelling bilateral lower legs   1. Cellulitis of right lower extremity - Apply unna boot  2. Cellulitis of left lower extremity - Apply unna boot  Add extra lasix at 1pm for the next 3 days Follow up on Monday Elevate  legs when sitting  The above assessment and management plan was discussed with the patient. The patient verbalized understanding of and has agreed to the management plan. Patient is aware to call the clinic if symptoms persist or worsen. Patient is aware when to return to the clinic for a follow-up visit. Patient educated on when it is appropriate to go to the emergency department.   Kylie Daphine Deutscher, FNP

## 2023-07-08 NOTE — Patient Instructions (Signed)
Unna Boot Care An Unna boot is a type of bandage (dressing) for the foot and leg. The dressing is a wrap made of gauze that is soaked with a medicine called zinc oxide. The gauze may also include other lotions and medicines that help in wound healing, such as calamine. An Unna boot may be used to: Treat open sores (venous ulcers) or graft sites on the foot, heel, or leg. Help with swelling from conditions that affect the veins or lymphatic system (lymphedema). Treat skin conditions such as inflammation caused by poor blood flow (stasis dermatitis). Heal wounds on parts of the body below the hips (lower extremities). The dressing is applied by a health care provider. The gauze is wrapped around your lower extremity in several layers that overlap. These layers usually start at the toes and go up to the knee. A dry outer wrap goes over the medicated wrap for support and pressure (compression). Before applying the Unna boot, your health care provider will clean your leg and foot and may apply an antibiotic. You may be asked to raise (elevate) your leg for a while to reduce swelling before the boot is put on. The boot will dry and harden after it is applied. It may need to be changed or replaced once or twice a week. Follow these instructions at home: Boot care Wear the Unna boot as told by your health care provider. You may need to wear a slipper or shoe over the boot that is one or two sizes larger than normal. Do not stick anything inside the boot to scratch your skin. Doing that increases your risk of infection. Check the skin around the boot every day. Tell your health care provider about any concerns. Keep your Unna boot clean and dry. Check the area around the boot every day for signs of infection. Check for: Redness, swelling, or pain in your foot or toes. Fluid or blood coming from the boot. Warmth. Pus or a bad smell. A rash, itching, or red, swollen areas of skin (hives). Remove the boot  and call your health care provider if you have signs of poor blood flow, such as: Your toes tingle or become numb. Your toes turn cold or turn blue or pale. Your toes are more swollen or painful. You cannot move your toes. Bathing Do not take baths, swim, or use a hot tub until your health care provider approves. Ask your health care provider if you may take showers. You may only be allowed to take sponge baths. If your health care provider says that you can take a bath or shower: Do not let the Unna boot get wet. Cover the boot with a watertight covering when you shower. Keep your leg with the boot out of the bathtub when you take a bath. Activity Rest as told by your health care provider. Do not sit for a long time without moving. Get up to take short walks every 1-2 hours. This will improve blood flow and breathing. Ask for help if you feel weak or unsteady. You may walk with the boot once it has dried. Ask your health care provider how much walking is safe for you. General instructions Take over-the-counter and prescription medicines only as told by your health care provider. Keep your leg elevated above the level of your heart while you are sitting or lying down. This will decrease swelling. Do not sit with your knee bent for long periods of time. Do not use any products that contain nicotine   or tobacco. These products include cigarettes, chewing tobacco, and vaping devices, such as e-cigarettes. If you need help quitting, ask your health care provider. Keep all follow-up visits. Your health care provider will change your boot once or twice a week until it is no longer needed. Contact a health care provider if: Your skin feels itchy inside the boot. You feel burning or have a rash or hives in the boot area. You have a fever or chills. You have any signs of infection. You have more numbness or pain in your foot or toes. The skin on your foot or toes changes colors. This may include the  skin turning blue or pale or having patchy areas with spots. Your boot has been damaged or feels like it no longer fits like it should. This information is not intended to replace advice given to you by your health care provider. Make sure you discuss any questions you have with your health care provider. Document Revised: 03/15/2022 Document Reviewed: 03/15/2022 Elsevier Patient Education  2024 [employer]  

## 2023-07-11 ENCOUNTER — Encounter: Payer: Self-pay | Admitting: Nurse Practitioner

## 2023-07-11 ENCOUNTER — Ambulatory Visit (INDEPENDENT_AMBULATORY_CARE_PROVIDER_SITE_OTHER): Payer: Medicare Other | Admitting: Nurse Practitioner

## 2023-07-11 VITALS — BP 122/64 | HR 63 | Temp 98.0°F | Resp 20

## 2023-07-11 DIAGNOSIS — L03115 Cellulitis of right lower limb: Secondary | ICD-10-CM

## 2023-07-11 DIAGNOSIS — B372 Candidiasis of skin and nail: Secondary | ICD-10-CM | POA: Diagnosis not present

## 2023-07-11 MED ORDER — NYSTATIN 100000 UNIT/GM EX CREA: 1.0000 | TOPICAL_CREAM | Freq: Two times a day (BID) | CUTANEOUS | 1 refills | Status: DC

## 2023-07-11 NOTE — Progress Notes (Signed)
Subjective:    Patient ID: Kylie Ward, female    DOB: 30-Apr-1946, 77 y.o.   MRN: [id number]   Chief Complaint: Recheck legs   HPI Patient was seen on Friday with bil lower ext cellulitis. She was started on antibiotic , increased lasix and wrapped both legs in unna boots. She is coming in today for recheck. She does not still have her unna boots on. Husband claims that they just fell off. Said happened this morning when she was getting undressed.  Perineal area is raw and burns  Patient Active Problem List   Diagnosis Date Noted   Primary insomnia 05/03/2023   Chronic midline low back pain without sciatica 05/03/2023   Coronary artery disease involving native coronary artery of native heart with unstable angina pectoris (HCC) 04/08/2023   Mixed hyperlipidemia 04/08/2023   Dementia without behavioral disturbance (HCC) 04/07/2023   Drug-induced myopathy 10/30/2020   Type 2 diabetes mellitus with diabetic polyneuropathy, with long-term current use of insulin (HCC) 03/06/2018   Atherosclerosis of aorta (HCC) 09/04/2016   Peripheral edema 11/13/2015   GAD (generalized anxiety disorder) 08/12/2015   BMI 31.0-31.9,adult 05/08/2015   Carotid artery stenosis, symptomatic 01/14/2014   Late effect of cerebrovascular accident (CVA) 12/10/2013   Hyperlipidemia with target LDL less than 100 01/26/2013   Essential hypertension, benign 01/26/2013       Review of Systems  Constitutional:  Negative for diaphoresis.  Eyes:  Negative for pain.  Respiratory:  Negative for shortness of breath.   Cardiovascular:  Negative for chest pain, palpitations and leg swelling.  Gastrointestinal:  Negative for abdominal pain.  Endocrine: Negative for polydipsia.  Skin:  Negative for rash.  Neurological:  Negative for dizziness, weakness and headaches.  Hematological:  Does not bruise/bleed easily.  All other systems reviewed and are negative.      Objective:   Physical Exam Constitutional:       Appearance: Normal appearance.  Cardiovascular:     Rate and Rhythm: Normal rate and regular rhythm.     Heart sounds: Normal heart sounds.  Pulmonary:     Effort: Pulmonary effort is normal.     Breath sounds: Normal breath sounds.  Musculoskeletal:     Right lower leg: Edema (2+) present.     Left lower leg: Edema (1+) present.  Skin:    General: Skin is warm.  Neurological:     General: No focal deficit present.     Mental Status: She is alert and oriented to person, place, and time.  Psychiatric:        Mood and Affect: Mood normal.        Behavior: Behavior normal.    BP 122/64   Pulse 63   Temp 98 F (36.7 C) (Temporal)   Resp 20   LMP 01/27/1983   SpO2 90%         Assessment & Plan:   Kylie Ward in today with chief complaint of Recheck legs   1. Cellulitis of right lower extremity Unna boot  on right Elevate legs while sitting Continue antibiotic as prescribed Continue lasix 2 a day Follow up in 4 days.  2. Cutaneous candidiasis Meds ordered this encounter  Medications   nystatin cream (MYCOSTATIN)    Sig: Apply 1 Application topically 2 (two) times daily.    Dispense:  30 g    Refill:  1    Order Specific Question:   Supervising Provider    Answer:   Arville Care A [  [id number]]       The above assessment and management plan was discussed with the patient. The patient verbalized understanding of and has agreed to the management plan. Patient is aware to call the clinic if symptoms persist or worsen. Patient is aware when to return to the clinic for a follow-up visit. Patient educated on when it is appropriate to go to the emergency department.   Kylie Daphine Deutscher, FNP

## 2023-07-11 NOTE — Patient Instructions (Signed)
Unna Boot Care An Unna boot is a type of bandage (dressing) for the foot and leg. The dressing is a wrap made of gauze that is soaked with a medicine called zinc oxide. The gauze may also include other lotions and medicines that help in wound healing, such as calamine. An Unna boot may be used to: Treat open sores (venous ulcers) or graft sites on the foot, heel, or leg. Help with swelling from conditions that affect the veins or lymphatic system (lymphedema). Treat skin conditions such as inflammation caused by poor blood flow (stasis dermatitis). Heal wounds on parts of the body below the hips (lower extremities). The dressing is applied by a health care provider. The gauze is wrapped around your lower extremity in several layers that overlap. These layers usually start at the toes and go up to the knee. A dry outer wrap goes over the medicated wrap for support and pressure (compression). Before applying the Unna boot, your health care provider will clean your leg and foot and may apply an antibiotic. You may be asked to raise (elevate) your leg for a while to reduce swelling before the boot is put on. The boot will dry and harden after it is applied. It may need to be changed or replaced once or twice a week. Follow these instructions at home: Boot care Wear the Unna boot as told by your health care provider. You may need to wear a slipper or shoe over the boot that is one or two sizes larger than normal. Do not stick anything inside the boot to scratch your skin. Doing that increases your risk of infection. Check the skin around the boot every day. Tell your health care provider about any concerns. Keep your Unna boot clean and dry. Check the area around the boot every day for signs of infection. Check for: Redness, swelling, or pain in your foot or toes. Fluid or blood coming from the boot. Warmth. Pus or a bad smell. A rash, itching, or red, swollen areas of skin (hives). Remove the boot  and call your health care provider if you have signs of poor blood flow, such as: Your toes tingle or become numb. Your toes turn cold or turn blue or pale. Your toes are more swollen or painful. You cannot move your toes. Bathing Do not take baths, swim, or use a hot tub until your health care provider approves. Ask your health care provider if you may take showers. You may only be allowed to take sponge baths. If your health care provider says that you can take a bath or shower: Do not let the Unna boot get wet. Cover the boot with a watertight covering when you shower. Keep your leg with the boot out of the bathtub when you take a bath. Activity Rest as told by your health care provider. Do not sit for a long time without moving. Get up to take short walks every 1-2 hours. This will improve blood flow and breathing. Ask for help if you feel weak or unsteady. You may walk with the boot once it has dried. Ask your health care provider how much walking is safe for you. General instructions Take over-the-counter and prescription medicines only as told by your health care provider. Keep your leg elevated above the level of your heart while you are sitting or lying down. This will decrease swelling. Do not sit with your knee bent for long periods of time. Do not use any products that contain nicotine   or tobacco. These products include cigarettes, chewing tobacco, and vaping devices, such as e-cigarettes. If you need help quitting, ask your health care provider. Keep all follow-up visits. Your health care provider will change your boot once or twice a week until it is no longer needed. Contact a health care provider if: Your skin feels itchy inside the boot. You feel burning or have a rash or hives in the boot area. You have a fever or chills. You have any signs of infection. You have more numbness or pain in your foot or toes. The skin on your foot or toes changes colors. This may include the  skin turning blue or pale or having patchy areas with spots. Your boot has been damaged or feels like it no longer fits like it should. This information is not intended to replace advice given to you by your health care provider. Make sure you discuss any questions you have with your health care provider. Document Revised: 03/15/2022 Document Reviewed: 03/15/2022 Elsevier Patient Education  2024 [employer]  

## 2023-07-12 ENCOUNTER — Encounter (HOSPITAL_COMMUNITY): Payer: Medicare Other | Admitting: Cardiology

## 2023-07-15 ENCOUNTER — Ambulatory Visit (INDEPENDENT_AMBULATORY_CARE_PROVIDER_SITE_OTHER): Payer: Medicare Other | Admitting: Nurse Practitioner

## 2023-07-15 ENCOUNTER — Ambulatory Visit: Payer: Medicare Other

## 2023-07-15 ENCOUNTER — Encounter: Payer: Self-pay | Admitting: Nurse Practitioner

## 2023-07-15 VITALS — BP 69/50 | HR 95 | Temp 97.2°F | Resp 20

## 2023-07-15 DIAGNOSIS — R600 Localized edema: Secondary | ICD-10-CM

## 2023-07-15 DIAGNOSIS — Z23 Encounter for immunization: Secondary | ICD-10-CM

## 2023-07-15 DIAGNOSIS — I959 Hypotension, unspecified: Secondary | ICD-10-CM | POA: Diagnosis not present

## 2023-07-15 NOTE — Progress Notes (Signed)
Subjective:    Patient ID: Kylie Ward, female    DOB: [birth year], 77 y.o.   MRN: [id number]   Chief Complaint: Recheck unna boots   HPI  Patient comes in for recheck of cellulitis. She is doing well. She spilt water on unna boot this morning and got it wet so her husband took it off.  Patient Active Problem List   Diagnosis Date Noted   Primary insomnia 05/03/2023   Chronic midline low back pain without sciatica 05/03/2023   Coronary artery disease involving native coronary artery of native heart with unstable angina pectoris (HCC) 04/08/2023   Mixed hyperlipidemia 04/08/2023   Dementia without behavioral disturbance (HCC) 04/07/2023   Drug-induced myopathy 10/30/2020   Type 2 diabetes mellitus with diabetic polyneuropathy, with long-term current use of insulin (HCC) 03/06/2018   Atherosclerosis of aorta (HCC) 09/04/2016   Peripheral edema 11/13/2015   GAD (generalized anxiety disorder) 08/12/2015   BMI 31.0-31.9,adult 05/08/2015   Carotid artery stenosis, symptomatic 01/14/2014   Late effect of cerebrovascular accident (CVA) 12/10/2013   Hyperlipidemia with target LDL less than 100 01/26/2013   Essential hypertension, benign 01/26/2013       Review of Systems  Constitutional:  Negative for diaphoresis.  Eyes:  Negative for pain.  Respiratory:  Negative for shortness of breath.   Cardiovascular:  Negative for chest pain, palpitations and leg swelling.  Gastrointestinal:  Negative for abdominal pain.  Endocrine: Negative for polydipsia.  Skin:  Negative for rash.  Neurological:  Negative for dizziness, weakness and headaches.  Hematological:  Does not bruise/bleed easily.  All other systems reviewed and are negative.      Objective:   Physical Exam Constitutional:      Appearance: Normal appearance.  Cardiovascular:     Rate and Rhythm: Normal rate and regular rhythm.     Heart sounds: Normal heart sounds.  Pulmonary:     Effort: Pulmonary effort is  normal.     Breath sounds: Normal breath sounds.  Musculoskeletal:     Right lower leg: Edema (1+) present.     Left lower leg: Edema (1+) present.  Skin:    General: Skin is warm.  Neurological:     General: No focal deficit present.     Mental Status: She is alert and oriented to person, place, and time.  Psychiatric:        Mood and Affect: Mood normal.        Behavior: Behavior normal.    BP (!) 69/50   Pulse 95   Temp (!) 97.2 F (36.2 C) (Temporal)   Resp 20   LMP 01/27/1983   SpO2 94%         Assessment & Plan:  Kylie Ward in today with chief complaint of Recheck unna boots   1. Peripheral edema Elevate legs while sitting Continue lasix BID   2. Hypotension, unspecified hypotension type Hold lisinopril Keep follow up appointment    The above assessment and management plan was discussed with the patient. The patient verbalized understanding of and has agreed to the management plan. Patient is aware to call the clinic if symptoms persist or worsen. Patient is aware when to return to the clinic for a follow-up visit. Patient educated on when it is appropriate to go to the emergency department.   [name], FNP

## 2023-07-19 ENCOUNTER — Telehealth: Payer: Self-pay | Admitting: Nurse Practitioner

## 2023-07-19 NOTE — Telephone Encounter (Signed)
Would like to know if Kylie Ward needs to continue to take medicine for her leg. If so, she will need more called in to the [facility].

## 2023-07-19 NOTE — Telephone Encounter (Signed)
Please review

## 2023-07-19 NOTE — Telephone Encounter (Signed)
Just needs to finish antibiotic. Dont forget lasix 2x a day now.

## 2023-07-20 ENCOUNTER — Telehealth: Payer: Self-pay | Admitting: Nurse Practitioner

## 2023-07-20 NOTE — Telephone Encounter (Signed)
Pt says that cellulitis has not cleared up and asking for refill on sulfamethoxazole-trimethoprim (BACTRIM DS) 800-160 MG tablet Use [facility] Pt aware MMM is off today and will address tomorrow.

## 2023-07-20 NOTE — Telephone Encounter (Signed)
Pt's spouse aware of provider feedback and voiced understanding.

## 2023-07-21 ENCOUNTER — Other Ambulatory Visit: Payer: Self-pay | Admitting: Nurse Practitioner

## 2023-07-21 MED ORDER — DOXYCYCLINE HYCLATE 100 MG PO TABS: 100.0000 mg | ORAL_TABLET | Freq: Two times a day (BID) | ORAL | 0 refills | Status: DC

## 2023-07-21 NOTE — Telephone Encounter (Signed)
Left detailed message on patients voicemail that new rx sent to pharmacy

## 2023-07-21 NOTE — Telephone Encounter (Signed)
Need to do different antibiotic will send to pahrmacy

## 2023-08-04 ENCOUNTER — Encounter: Payer: Self-pay | Admitting: Cardiovascular Disease

## 2023-08-04 ENCOUNTER — Ambulatory Visit (HOSPITAL_BASED_OUTPATIENT_CLINIC_OR_DEPARTMENT_OTHER): Payer: Medicare Other

## 2023-08-04 ENCOUNTER — Ambulatory Visit (HOSPITAL_COMMUNITY): Payer: Medicare Other | Attending: Cardiovascular Disease | Admitting: Cardiovascular Disease

## 2023-08-04 VITALS — BP 142/62 | HR 59 | Ht 64.0 in | Wt 164.2 lb

## 2023-08-04 DIAGNOSIS — I255 Ischemic cardiomyopathy: Secondary | ICD-10-CM | POA: Insufficient documentation

## 2023-08-04 DIAGNOSIS — I5020 Unspecified systolic (congestive) heart failure: Secondary | ICD-10-CM | POA: Diagnosis not present

## 2023-08-04 DIAGNOSIS — I2510 Atherosclerotic heart disease of native coronary artery without angina pectoris: Secondary | ICD-10-CM | POA: Insufficient documentation

## 2023-08-04 MED ORDER — SPIRONOLACTONE 25 MG PO TABS: 12.5000 mg | ORAL_TABLET | Freq: Every day | ORAL | 3 refills | Status: DC

## 2023-08-04 MED ORDER — PERFLUTREN LIPID MICROSPHERE
1.0000 mL | INTRAVENOUS | Status: AC | PRN
  Administered 2023-08-04: 2 mL via INTRAVENOUS

## 2023-08-04 NOTE — Progress Notes (Signed)
Cardiology Office Note:    Date:  08/04/2023   ID:  Kylie Ward, DOB [birth year], MRN [id number]  PCP:  Bennie Pierini, FNP   [facility] Providers Cardiologist:  None     Referring MD: Bennie Pierini, *   Chief Complaint  Patient presents with   Congestive Heart Failure    History of Present Illness:    Kylie Ward is a 77 y.o. female with a hx of left main and multivessel coronary artery disease, presenting for follow-up evaluation.  The patient was hospitalized in June 2024 with heart failure and elevated troponin.  She underwent cardiac catheterization demonstrating left main and severe multivessel disease.  She was not a candidate for CABG with multiple medical problems and dementia.  She was seen back in follow-up a few weeks after discharge and was doing well with no ongoing symptoms of heart failure or chest pain.  Current medicines were continued.  She has left subclavian stenosis and carotid artery disease.  She was seen in outpatient follow-up by vascular surgery who recommended ongoing medical therapy.  The patient is here with her husband and her daughter today.  She reports no recent problems and specifically denies any chest pain, chest pressure, or shortness of breath.  She does have leg edema and is currently being treated for cellulitis.  Her daughter thinks her leg edema is a little worse than in the past.  She has no orthopnea or PND.  Denies palpitations.  She is compliant with her medications.  Past Medical History:  Diagnosis Date   Anxiety    Diabetes mellitus without complication (HCC)    Hyperlipidemia    Hypertension    Stroke [facility])     Past Surgical History:  Procedure Laterality Date   CAROTID ENDARTERECTOMY Right    CAROTID STENT     LEFT HEART CATH AND CORONARY ANGIOGRAPHY N/A 04/07/2023   Procedure: LEFT HEART CATH AND CORONARY ANGIOGRAPHY;  Surgeon: Tonny Bollman, MD;  Location: [facility] INVASIVE CV LAB;  Service:  Cardiovascular;  Laterality: N/A;    Current Medications: Current Meds  Medication Sig   Accu-Chek FastClix Lancets MISC CHECK BLOOD SUGAR UP TO 3 TIMES A DAY AS DIRECTED Dx E11.42   ALPRAZolam (XANAX) 1 MG tablet Take 1 tablet (1 mg total) by mouth 2 (two) times daily.   atorvastatin (LIPITOR) 40 MG tablet Take 1 tablet (40 mg total) by mouth daily.   Blood Glucose Monitoring Suppl (ACCU-CHEK NANO SMARTVIEW) w/Device KIT CHECK BLOOD SUGER UP TO 3 TIMES A DAY   clopidogrel (PLAVIX) 75 MG tablet Take 1 tablet (75 mg total) by mouth daily.   Continuous Blood Gluc Receiver (FREESTYLE LIBRE 2 READER) DEVI Use to test blood sugar continuously. DX: E11.65   Continuous Glucose Sensor (FREESTYLE LIBRE 2 SENSOR) MISC CHANGE SENSOR ON BACK OF ARM EVERY 14 DAYS AS DIRECTED TO CHECK BLOOD SUGAR CONTINUOUSLY   donepezil (ARICEPT) 10 MG tablet Take 1 tablet (10 mg total) by mouth at bedtime.   doxycycline (VIBRA-TABS) 100 MG tablet Take 1 tablet (100 mg total) by mouth 2 (two) times daily. 1 po bid   Elastic Bandages & Supports (V-2 HIGH COMPRESSION HOSE) MISC 1 each by Does not apply route daily.   escitalopram (LEXAPRO) 20 MG tablet Take 1 tablet (20 mg total) by mouth daily.   fenofibrate 160 MG tablet Take 1 tablet (160 mg total) by mouth daily.   furosemide (LASIX) 40 MG tablet Take 1 tablet (40 mg total) by  mouth daily.   glucose blood (ACCU-CHEK SMARTVIEW) test strip CHECK BLOOD SUGAR UP TO 3 TIMES A DAY AS DIRECTED   insulin aspart (NOVOLOG) 100 UNIT/ML injection Inject 0-10 Units into the skin See admin instructions. 0-10 units per sliding scale, three times daily before meals.   insulin glargine (LANTUS SOLOSTAR) 100 UNIT/ML Solostar Pen Inject 35 Units into the skin daily.   Insulin Pen Needle (SURE COMFORT PEN NEEDLES) 31G X 8 MM MISC USE TO inject lantus ONCE daily   memantine (NAMENDA) 10 MG tablet TAKE ONE TABLET BY MOUTH TWICE DAILY   metFORMIN (GLUCOPHAGE-XR) 750 MG 24 hr tablet Take 750  mg by mouth 2 (two) times daily.   metoprolol succinate (TOPROL-XL) 25 MG 24 hr tablet Take 1 tablet (25 mg total) by mouth daily.   Multiple Vitamins-Minerals (ONE-A-DAY WOMENS 50+) TABS Take 1 tablet by mouth daily.   nystatin cream (MYCOSTATIN) Apply 1 Application topically 2 (two) times daily.   polyethylene glycol powder (GLYCOLAX/MIRALAX) 17 GM/SCOOP powder Take 17 g by mouth daily.   spironolactone (ALDACTONE) 25 MG tablet Take 0.5 tablets (12.5 mg total) by mouth daily.   sulfamethoxazole-trimethoprim (BACTRIM DS) 800-160 MG tablet Take 1 tablet by mouth 2 (two) times daily.   traZODone (DESYREL) 50 MG tablet Take 1 tablet (50 mg total) by mouth at bedtime.     Allergies:   Crestor [rosuvastatin] and Niaspan [niacin er]   Social History   Socioeconomic History   Marital status: Married    Spouse name: Animal nutritionist   Number of children: 1   Years of education: 10   Highest education level: 10th grade  Occupational History   Occupation: retired    Associate Professor: [employer]  Tobacco Use   Smoking status: Former    Current packs/day: 0.00    Types: Cigarettes    Quit date: 12/02/2009    Years since quitting: 13.6   Smokeless tobacco: Never  Vaping Use   Vaping status: Never Used  Substance and Sexual Activity   Alcohol use: No   Drug use: No   Sexual activity: Yes    Birth control/protection: Post-menopausal  Other Topics Concern   Not on file  Social History Narrative   One level living with her husband   Social Determinants of Health   Financial Resource Strain: Low Risk  (05/10/2022)   Overall Financial Resource Strain (CARDIA)    Difficulty of Paying Living Expenses: Not hard at all  Food Insecurity: No Food Insecurity (04/12/2023)   Hunger Vital Sign    Worried About Running Out of Food in the Last Year: Never true    Ran Out of Food in the Last Year: Never true  Transportation Needs: No Transportation Needs (04/12/2023)   PRAPARE - Administrator, Civil Service  (Medical): No    Lack of Transportation (Non-Medical): No  Physical Activity: Inactive (05/10/2022)   Exercise Vital Sign    Days of Exercise per Week: 0 days    Minutes of Exercise per Session: 0 min  Stress: No Stress Concern Present (05/10/2022)   [employer] of Occupational Health - Occupational Stress Questionnaire    Feeling of Stress : Only a little  Social Connections: Socially Isolated (05/07/2021)   Social Connection and Isolation Panel [NHANES]    Frequency of Communication with Friends and Family: Once a week    Frequency of Social Gatherings with Friends and Family: Once a week    Attends Religious Services: Never    Active Member  of Clubs or Organizations: No    Attends Banker Meetings: Never    Marital Status: Married     Family History: The patient's family history includes Diabetes in her brother, brother, brother, brother, sister, sister, and sister; Heart attack in her father; Heart disease in her father; Hypertension in her father; Stroke in her sister.  ROS:   Please see the history of present illness.    All other systems reviewed and are negative.  EKGs/Labs/Other Studies Reviewed:    The following studies were reviewed today: Cardiac catheterization 04/07/2023: 1.  Severe proximal and moderate distal left main disease of 70 and 50% 2.  Severe mid LAD stenosis of 80% 3.  Severe ostial and mid circumflex stenoses of 80 and 90% 4.  Total occlusion of the mid RCA with left-to-right collaterals 5.  Severely elevated LVEDP of 29 mmHg   The patient has severe calcific multivessel coronary artery disease including left main disease, total occlusion of the RCA, and severe stenoses in the LAD and circumflex vessels.  Due to comorbid conditions, she does not appear to be a candidate for CABG.  Favor medical therapy for coronary artery disease and continued diuresis for treatment of heart failure.  Further management and care per primary cardiology  team.     Echo 04/08/2023: 1. Left ventricular ejection fraction, by estimation, is 25 to 30%. The  left ventricle has severely decreased function. The left ventricle  demonstrates regional wall motion abnormalities (see scoring  diagram/findings for description). Left ventricular  diastolic parameters are consistent with Grade I diastolic dysfunction  (impaired relaxation).   2. Right ventricular systolic function is normal. The right ventricular  size is normal. There is normal pulmonary artery systolic pressure.   3. The mitral valve is abnormal. Moderate to severe mitral valve  regurgitation. No evidence of mitral stenosis. Moderate mitral annular  calcification.     ddds   4. The aortic valve was not well visualized. Aortic valve regurgitation  is trivial. No aortic stenosis is present.   FINDINGS   Left Ventricle: Left ventricular ejection fraction, by estimation, is 25  to 30%. The left ventricle has severely decreased function. The left  ventricle demonstrates regional wall motion abnormalities. Definity  contrast agent was given IV to delineate  the left ventricular endocardial borders. The left ventricular internal  cavity size was normal in size. There is no left ventricular hypertrophy.  Left ventricular diastolic parameters are consistent with Grade I  diastolic dysfunction (impaired  relaxation).   Recent Labs: 04/07/2023: B Natriuretic Peptide 870.1; TSH 2.254 04/08/2023: Magnesium 1.8 05/03/2023: ALT 17; BUN 26; Creatinine, Ser 1.62; Hemoglobin 11.8; Platelets 256; Potassium 4.6; Sodium 138  Recent Lipid Panel    Component Value Date/Time   CHOL 162 05/03/2023 1444   CHOL 201 (H) 05/11/2013 1735   TRIG 398 (H) 05/03/2023 1444   TRIG 349 (H) 12/20/2014 1500   TRIG 324 (H) 05/11/2013 1735   HDL 39 (L) 05/03/2023 1444   HDL 36 (L) 12/20/2014 1500   HDL 41 05/11/2013 1735   CHOLHDL 4.2 05/03/2023 1444   LDLCALC 62 05/03/2023 1444   LDLCALC 56 05/21/2014 1554    LDLCALC 95 05/11/2013 1735           Physical Exam:    VS:  BP (!) 142/62   Pulse (!) 59   Ht 5\' 4"  (1.626 m)   Wt 164 lb 3.2 oz (74.5 kg)   LMP 01/27/1983   SpO2 98%  BMI 28.18 kg/m     Wt Readings from Last 3 Encounters:  08/04/23 164 lb 3.2 oz (74.5 kg)  07/06/23 158 lb (71.7 kg)  05/03/23 158 lb (71.7 kg)     GEN:  Well nourished, well developed pleasant elderly woman in a wheelchair, in no acute distress HEENT: Normal NECK: No JVD; No carotid bruits LYMPHATICS: No lymphadenopathy CARDIAC: RRR, no murmurs, rubs, gallops RESPIRATORY:  Clear to auscultation without rales, wheezing or rhonchi  ABDOMEN: Soft, non-tender, non-distended MUSCULOSKELETAL:  No edema; No deformity  SKIN: Warm and dry NEUROLOGIC:  Alert and oriented x 3 PSYCHIATRIC:  Normal affect   ASSESSMENT:    1. Coronary artery disease involving native coronary artery of native heart without angina pectoris   2. HFrEF (heart failure with reduced ejection fraction) (HCC)    PLAN:    In order of problems listed above:  The patient appears clinically stable with respect to her multivessel CAD.  She is experiencing no angina.  She remains on clopidogrel for antiplatelet therapy and atorvastatin for lipid lowering.  She is also on a beta-blocker.  Follow-up APP 3 months. The patient appears clinically stable but does have some signs of volume overload with lower extremity edema.  Recommend continue furosemide 40 mg daily and add spironolactone 12.5 mg daily.  Discussed low-sodium diet.  Check metabolic panel today.  I think she would be at high risk for GU infection and probably best to stay away from SGLT2 inhibitors.  I personally reviewed her echo images today.  The formal interpretation is pending.  By my assessment, she continues to have severe LV dysfunction with segmental wall motion abnormalities and LVEF less than 35% as well as moderate mitral regurgitation.  Will await formal interpretation and  notify them if any other medication changes are needed.  In reviewing outside records, she had a outpatient blood pressure in the 60s and her lisinopril was held.  She is doing better since that time.  I do not think she will tolerate any aggressive GDMT.  Follow-up 3 months.       Medication Adjustments/Labs and Tests Ordered: Current medicines are reviewed at length with the patient today.  Concerns regarding medicines are outlined above.  Orders Placed This Encounter  Procedures   Basic metabolic panel   Meds ordered this encounter  Medications   spironolactone (ALDACTONE) 25 MG tablet    Sig: Take 0.5 tablets (12.5 mg total) by mouth daily.    Dispense:  45 tablet    Refill:  3    Patient Instructions  Medication Instructions:  START Spironolactone 12.5mg  daily *If you need a refill on your cardiac medications before your next appointment, please call your pharmacy*   Lab Work: BMET today If you have labs (blood work) drawn today and your tests are completely normal, you will receive your results only by: MyChart Message (if you have MyChart) OR A paper copy in the mail If you have any lab test that is abnormal or we need to change your treatment, we will call you to review the results.   Testing/Procedures: NONE   Follow-Up: At [facility], you and your health needs are our priority.  As part of our continuing mission to provide you with exceptional heart care, we have created designated Provider Care Teams.  These Care Teams include your primary Cardiologist (physician) and Advanced Practice Providers (APPs -  Physician Assistants and Nurse Practitioners) who all work together to provide you with the care you  need, when you need it.  We recommend signing up for the patient portal called "MyChart".  Sign up information is provided on this After Visit Summary.  MyChart is used to connect with patients for Virtual Visits (Telemedicine).  Patients are able to view  lab/test results, encounter notes, upcoming appointments, etc.  Non-urgent messages can be sent to your provider as well.   To learn more about what you can do with MyChart, go to [email or web address].    Your next appointment:   3 month(s)  Provider:   APP       Signed, Tonny Bollman, MD  08/04/2023 3:24 PM    [facility]

## 2023-08-04 NOTE — Patient Instructions (Signed)
Medication Instructions:  START Spironolactone 12.5mg  daily *If you need a refill on your cardiac medications before your next appointment, please call your pharmacy*   Lab Work: BMET today If you have labs (blood work) drawn today and your tests are completely normal, you will receive your results only by: MyChart Message (if you have MyChart) OR A paper copy in the mail If you have any lab test that is abnormal or we need to change your treatment, we will call you to review the results.   Testing/Procedures: NONE   Follow-Up: At [facility], you and your health needs are our priority.  As part of our continuing mission to provide you with exceptional heart care, we have created designated Provider Care Teams.  These Care Teams include your primary Cardiologist (physician) and Advanced Practice Providers (APPs -  Physician Assistants and Nurse Practitioners) who all work together to provide you with the care you need, when you need it.  We recommend signing up for the patient portal called "MyChart".  Sign up information is provided on this After Visit Summary.  MyChart is used to connect with patients for Virtual Visits (Telemedicine).  Patients are able to view lab/test results, encounter notes, upcoming appointments, etc.  Non-urgent messages can be sent to your provider as well.   To learn more about what you can do with MyChart, go to [email or web address].    Your next appointment:   3 month(s)  Provider:   APP

## 2023-08-05 ENCOUNTER — Ambulatory Visit (INDEPENDENT_AMBULATORY_CARE_PROVIDER_SITE_OTHER): Payer: Medicare Other | Admitting: Nurse Practitioner

## 2023-08-05 ENCOUNTER — Ambulatory Visit: Payer: Medicare Other | Admitting: Nurse Practitioner

## 2023-08-05 ENCOUNTER — Other Ambulatory Visit: Payer: Medicare Other

## 2023-08-05 ENCOUNTER — Encounter: Payer: Self-pay | Admitting: Nurse Practitioner

## 2023-08-05 ENCOUNTER — Other Ambulatory Visit: Payer: Self-pay | Admitting: Nurse Practitioner

## 2023-08-05 VITALS — BP 135/63 | HR 65 | Temp 98.4°F | Ht 64.0 in | Wt 164.0 lb

## 2023-08-05 DIAGNOSIS — Z794 Long term (current) use of insulin: Secondary | ICD-10-CM | POA: Diagnosis not present

## 2023-08-05 DIAGNOSIS — F5101 Primary insomnia: Secondary | ICD-10-CM | POA: Diagnosis not present

## 2023-08-05 DIAGNOSIS — Z79891 Long term (current) use of opiate analgesic: Secondary | ICD-10-CM | POA: Diagnosis not present

## 2023-08-05 DIAGNOSIS — R600 Localized edema: Secondary | ICD-10-CM | POA: Diagnosis not present

## 2023-08-05 DIAGNOSIS — E785 Hyperlipidemia, unspecified: Secondary | ICD-10-CM

## 2023-08-05 DIAGNOSIS — I10 Essential (primary) hypertension: Secondary | ICD-10-CM | POA: Diagnosis not present

## 2023-08-05 DIAGNOSIS — E1169 Type 2 diabetes mellitus with other specified complication: Secondary | ICD-10-CM | POA: Diagnosis not present

## 2023-08-05 DIAGNOSIS — E1142 Type 2 diabetes mellitus with diabetic polyneuropathy: Secondary | ICD-10-CM | POA: Diagnosis not present

## 2023-08-05 DIAGNOSIS — L89321 Pressure ulcer of left buttock, stage 1: Secondary | ICD-10-CM

## 2023-08-05 DIAGNOSIS — F411 Generalized anxiety disorder: Secondary | ICD-10-CM

## 2023-08-05 DIAGNOSIS — F0390 Unspecified dementia without behavioral disturbance: Secondary | ICD-10-CM

## 2023-08-05 DIAGNOSIS — I25110 Atherosclerotic heart disease of native coronary artery with unstable angina pectoris: Secondary | ICD-10-CM | POA: Diagnosis not present

## 2023-08-05 LAB — BASIC METABOLIC PANEL
BUN/Creatinine Ratio: 33 — ABNORMAL HIGH (ref 12–28)
BUN: 45 mg/dL — ABNORMAL HIGH (ref 8–27)
CO2: 23 mmol/L (ref 20–29)
Calcium: 9.6 mg/dL (ref 8.7–10.3)
Chloride: 98 mmol/L (ref 96–106)
Creatinine, Ser: 1.38 mg/dL — ABNORMAL HIGH (ref 0.57–1.00)
Glucose: 97 mg/dL (ref 70–99)
Potassium: 4.7 mmol/L (ref 3.5–5.2)
Sodium: 140 mmol/L (ref 134–144)
eGFR: 40 mL/min/{1.73_m2} — ABNORMAL LOW (ref >59)

## 2023-08-05 LAB — BAYER DCA HB A1C WAIVED: HB A1C (BAYER DCA - WAIVED): 9.9 % — ABNORMAL HIGH (ref 4.8–5.6)

## 2023-08-05 MED ORDER — TRAZODONE HCL 50 MG PO TABS: 50.0000 mg | ORAL_TABLET | Freq: Every day | ORAL | 1 refills | Status: DC

## 2023-08-05 MED ORDER — MEMANTINE HCL 10 MG PO TABS: 10.0000 mg | ORAL_TABLET | Freq: Two times a day (BID) | ORAL | 1 refills | Status: DC

## 2023-08-05 MED ORDER — LISINOPRIL 10 MG PO TABS: 10.0000 mg | ORAL_TABLET | Freq: Every day | ORAL | 1 refills | Status: DC

## 2023-08-05 MED ORDER — CALMOSEPTINE 0.44-20.6 % EX OINT: TOPICAL_OINTMENT | CUTANEOUS | 2 refills | Status: DC

## 2023-08-05 MED ORDER — LANTUS SOLOSTAR 100 UNIT/ML ~~LOC~~ SOPN
40.0000 [IU] | PEN_INJECTOR | Freq: Every day | SUBCUTANEOUS | 99 refills | Status: DC
Start: 2023-08-05 — End: 2023-08-08

## 2023-08-05 MED ORDER — SULFAMETHOXAZOLE-TRIMETHOPRIM 800-160 MG PO TABS: 1.0000 | ORAL_TABLET | Freq: Two times a day (BID) | ORAL | 0 refills | Status: DC

## 2023-08-05 NOTE — Patient Instructions (Signed)

## 2023-08-05 NOTE — Progress Notes (Signed)
Subjective:    Patient ID: Kylie Ward, female    DOB: [birth year], 77 y.o.   MRN: [id number]   Chief Complaint: medical management of chronic issues     HPI:  Kylie Ward is a 77 y.o. who identifies as a female who was assigned female at birth.   Social history: Lives with: husband who is her caregiver Work history: retired   Water engineer in today for follow up of the following chronic medical issues:  1. Essential hypertension, benign No c/o chest pain, sob or headache. Does not check blood pressure at home. BP Readings from Last 3 Encounters:  08/04/23 (!) 142/62  07/15/23 (!) 69/50  07/11/23 122/64     2. Coronary artery disease involving native coronary artery of native heart with unstable angina pectoris [facility]) Patient had left heart cath on 04/07/23. Had follow up with cardiology on 04/21/23. Review of office noted showed no change in plan of care.  3. Type 2 diabetes mellitus with diabetic polyneuropathy, with long-term current use of insulin (HCC) Fasting blood sugars are running around 120-150. Denis any low blood sugars. She had been off of some of her diabetic meds at last check. Was instructed to start back on all of them. Novalog sliding scale and lantus 35u daily. Lab Results  Component Value Date   HGBA1C 9.3 (H) 05/03/2023     4. Hyperlipidemia associated with type 2 diabetes mellitus (HCC) Eats whatever her husband fixes her to eat. Does no exercise at all. Lab Results  Component Value Date   CHOL 162 05/03/2023   HDL 39 (L) 05/03/2023   LDLCALC 62 05/03/2023   TRIG 398 (H) 05/03/2023   CHOLHDL 4.2 05/03/2023     5. Peripheral edema Has occasional edema. Has recently had cellulitis and had to have unna boots. She currently has edema and redness again on right leg. Cardiologist added aldactone to meds to help with edema.  6. Dementia without behavioral disturbance [facility]) Husband says she is slowly getting worse  7. GAD (generalized anxiety  disorder) Stays anxious. Her husband gives her xanax BID    08/05/2023    2:05 PM 07/11/2023    2:01 PM 07/08/2023   12:21 PM 05/03/2023    2:17 PM  GAD 7 : Generalized Anxiety Score  Nervous, Anxious, on Edge 2 2 2 1   Control/stop worrying 2 1 1  0  Worry too much - different things 3 1 1 2   Trouble relaxing 1 1 0 1  Restless 1 2 1 1   Easily annoyed or irritable 2 2 2 2   Afraid - awful might happen 1 0 1 1  Total GAD 7 Score 12 9 8 8   Anxiety Difficulty Somewhat difficult Not difficult at all Somewhat difficult Not difficult at all      8. Primary insomnia Takes trazadone to sleep and sleeps well.   New complaints: Husband says she has a sore place on her butt that is draining. Not sure how long it has been on there.  Allergies  Allergen Reactions   Crestor [Rosuvastatin] Other (See Comments)    Myalgias and arthralgias to the point she had difficulty walking. She was tolerant of lipitor from the family's recall of events.    Niaspan [Niacin Er] Itching    Skin burning   Outpatient Encounter Medications as of 08/05/2023  Medication Sig   Accu-Chek FastClix Lancets MISC CHECK BLOOD SUGAR UP TO 3 TIMES A DAY AS DIRECTED Dx I69.62   ALPRAZolam (XANAX) 1  MG tablet Take 1 tablet (1 mg total) by mouth 2 (two) times daily.   atorvastatin (LIPITOR) 40 MG tablet Take 1 tablet (40 mg total) by mouth daily.   Blood Glucose Monitoring Suppl (ACCU-CHEK NANO SMARTVIEW) w/Device KIT CHECK BLOOD SUGER UP TO 3 TIMES A DAY   clopidogrel (PLAVIX) 75 MG tablet Take 1 tablet (75 mg total) by mouth daily.   Continuous Blood Gluc Receiver (FREESTYLE LIBRE 2 READER) DEVI Use to test blood sugar continuously. DX: E11.65   Continuous Glucose Sensor (FREESTYLE LIBRE 2 SENSOR) MISC CHANGE SENSOR ON BACK OF ARM EVERY 14 DAYS AS DIRECTED TO CHECK BLOOD SUGAR CONTINUOUSLY   donepezil (ARICEPT) 10 MG tablet Take 1 tablet (10 mg total) by mouth at bedtime.   doxycycline (VIBRA-TABS) 100 MG tablet Take 1 tablet  (100 mg total) by mouth 2 (two) times daily. 1 po bid   Elastic Bandages & Supports (V-2 HIGH COMPRESSION HOSE) MISC 1 each by Does not apply route daily.   escitalopram (LEXAPRO) 20 MG tablet Take 1 tablet (20 mg total) by mouth daily.   fenofibrate 160 MG tablet Take 1 tablet (160 mg total) by mouth daily.   furosemide (LASIX) 40 MG tablet Take 1 tablet (40 mg total) by mouth daily.   glucose blood (ACCU-CHEK SMARTVIEW) test strip CHECK BLOOD SUGAR UP TO 3 TIMES A DAY AS DIRECTED   insulin aspart (NOVOLOG) 100 UNIT/ML injection Inject 0-10 Units into the skin See admin instructions. 0-10 units per sliding scale, three times daily before meals.   insulin glargine (LANTUS SOLOSTAR) 100 UNIT/ML Solostar Pen Inject 35 Units into the skin daily.   Insulin Pen Needle (SURE COMFORT PEN NEEDLES) 31G X 8 MM MISC USE TO inject lantus ONCE daily   lisinopril (ZESTRIL) 10 MG tablet Take 1 tablet (10 mg total) by mouth daily.   memantine (NAMENDA) 10 MG tablet TAKE ONE TABLET BY MOUTH TWICE DAILY   metFORMIN (GLUCOPHAGE-XR) 750 MG 24 hr tablet TAKE ONE TABLET BY MOUTH TWICE DAILY WITH MEAL(S)   metoprolol succinate (TOPROL-XL) 25 MG 24 hr tablet Take 1 tablet (25 mg total) by mouth daily.   Multiple Vitamins-Minerals (ONE-A-DAY WOMENS 50+) TABS Take 1 tablet by mouth daily.   nystatin cream (MYCOSTATIN) Apply 1 Application topically 2 (two) times daily.   polyethylene glycol powder (GLYCOLAX/MIRALAX) 17 GM/SCOOP powder Take 17 g by mouth daily.   spironolactone (ALDACTONE) 25 MG tablet Take 0.5 tablets (12.5 mg total) by mouth daily.   sulfamethoxazole-trimethoprim (BACTRIM DS) 800-160 MG tablet Take 1 tablet by mouth 2 (two) times daily.   traZODone (DESYREL) 50 MG tablet Take 1 tablet (50 mg total) by mouth at bedtime.   No facility-administered encounter medications on file as of 08/05/2023.    Past Surgical History:  Procedure Laterality Date   CAROTID ENDARTERECTOMY Right    CAROTID STENT      LEFT HEART CATH AND CORONARY ANGIOGRAPHY N/A 04/07/2023   Procedure: LEFT HEART CATH AND CORONARY ANGIOGRAPHY;  Surgeon: Tonny Bollman, MD;  Location: [facility] INVASIVE CV LAB;  Service: Cardiovascular;  Laterality: N/A;    Family History  Problem Relation Age of Onset   Heart disease Father    Heart attack Father    Hypertension Father    Diabetes Sister    Stroke Sister    Diabetes Brother    Diabetes Sister    Diabetes Sister    Diabetes Brother    Diabetes Brother    Diabetes Brother  Controlled substance contract: 08/05/23     Review of Systems  Constitutional:  Negative for diaphoresis.  Eyes:  Negative for pain.  Respiratory:  Negative for shortness of breath.   Cardiovascular:  Positive for leg swelling. Negative for chest pain and palpitations.  Gastrointestinal:  Negative for abdominal pain.  Endocrine: Negative for polydipsia.  Skin:  Negative for rash.       Sore on buttocks  Neurological:  Negative for dizziness, weakness and headaches.  Hematological:  Does not bruise/bleed easily.  All other systems reviewed and are negative.      Objective:   Physical Exam Constitutional:      Appearance: Normal appearance.  Cardiovascular:     Rate and Rhythm: Normal rate and regular rhythm.     Heart sounds: Normal heart sounds.  Pulmonary:     Effort: Pulmonary effort is normal.     Breath sounds: Normal breath sounds.  Musculoskeletal:     Right lower leg: Edema (2+) present.     Left lower leg: Edema (1+) present.  Skin:    General: Skin is warm.     Comments: Right lower ext erythematous and draining clear fluid  3cm open erythematous sore left buttocks  Neurological:     General: No focal deficit present.     Mental Status: She is alert and oriented to person, place, and time.  Psychiatric:        Mood and Affect: Mood normal.        Behavior: Behavior normal.     BP 135/63   Pulse 65   Temp 98.4 F (36.9 C) (Skin)   Ht 5\' 4"  (1.626 m)   Wt  164 lb (74.4 kg)   LMP 01/27/1983   BMI 28.15 kg/m   Hgba1c 9.9%      Assessment & Plan:   Kylie Ward comes in today with chief complaint of Medical Management of Chronic Issues   Diagnosis and orders addressed:  1. Essential hypertension, benign Low sodium diet - CBC with Differential/Platelet  2. Coronary artery disease involving native coronary artery of native heart with unstable angina pectoris [facility]) Keep follow up with cardiology  3. Type 2 diabetes mellitus with diabetic polyneuropathy, with long-term current use of insulin (HCC) Continue to watch carbs in diet Increase lantus to 40u daily - Bayer DCA Hb A1c Waived  4. Hyperlipidemia associated with type 2 diabetes mellitus (HCC) Low fat diet - Lipid panel  5. Peripheral edema Elevate legs when sitting Bactrim as ordered DO NOT REMOVE UNNA BOOTS - Apply unna boot  6. Dementia without behavioral disturbance (HCC) Orient daily  7. GAD (generalized anxiety disorder) - Drug Screen 10 W/Conf, Se  8. Primary insomnia Bedtime routine - traZODone (DESYREL) 50 MG tablet; Take 1 tablet (50 mg total) by mouth at bedtime.  Dispense: 90 tablet; Refill: 1  9. Pressure ulcer Calmoseptine daily Change sitting positions frequently  Labs pending Health Maintenance reviewed Diet and exercise encouraged  Follow up plan: 3 days   [name], FNP

## 2023-08-06 LAB — ECHOCARDIOGRAM COMPLETE
Area-P 1/2: 2.78 cm2
S' Lateral: 3.9 cm

## 2023-08-08 ENCOUNTER — Encounter: Payer: Self-pay | Admitting: Nurse Practitioner

## 2023-08-08 ENCOUNTER — Other Ambulatory Visit: Payer: Self-pay | Admitting: *Deleted

## 2023-08-08 ENCOUNTER — Ambulatory Visit (INDEPENDENT_AMBULATORY_CARE_PROVIDER_SITE_OTHER): Payer: Medicare Other | Admitting: Nurse Practitioner

## 2023-08-08 VITALS — BP 123/78 | HR 58 | Temp 97.1°F | Resp 20

## 2023-08-08 DIAGNOSIS — L03119 Cellulitis of unspecified part of limb: Secondary | ICD-10-CM | POA: Diagnosis not present

## 2023-08-08 DIAGNOSIS — E1142 Type 2 diabetes mellitus with diabetic polyneuropathy: Secondary | ICD-10-CM

## 2023-08-08 DIAGNOSIS — L02419 Cutaneous abscess of limb, unspecified: Secondary | ICD-10-CM | POA: Diagnosis not present

## 2023-08-08 MED ORDER — INSULIN GLARGINE 100 UNIT/ML ~~LOC~~ SOLN: 40.0000 [IU] | Freq: Every day | SUBCUTANEOUS | 0 refills | Status: DC

## 2023-08-08 NOTE — Progress Notes (Signed)
TC from [facility], checking on dose and verifying change, verified 40 U from 08/05/23 notes. Also pt has always done vials not Solostar pen recent script as vials.

## 2023-08-08 NOTE — Progress Notes (Signed)
   Subjective:    Patient ID: Kylie Ward, female    DOB: Dec 21, 1945, 77 y.o.   MRN: [id number]   Chief Complaint: Recheck legs   HPI Patient was seen on Friday with cellulitis right leg- unna boot was applied. They removed the boot at home this morning. Taking antibiotic.  Patient Active Problem List   Diagnosis Date Noted   Primary insomnia 05/03/2023   Chronic midline low back pain without sciatica 05/03/2023   Coronary artery disease involving native coronary artery of native heart with unstable angina pectoris (HCC) 04/08/2023   Dementia without behavioral disturbance (HCC) 04/07/2023   Drug-induced myopathy 10/30/2020   Type 2 diabetes mellitus with diabetic polyneuropathy, with long-term current use of insulin (HCC) 03/06/2018   Atherosclerosis of aorta (HCC) 09/04/2016   Peripheral edema 11/13/2015   GAD (generalized anxiety disorder) 08/12/2015   BMI 31.0-31.9,adult 05/08/2015   Carotid artery stenosis, symptomatic 01/14/2014   Late effect of cerebrovascular accident (CVA) 12/10/2013   Hyperlipidemia associated with type 2 diabetes mellitus (HCC) 01/26/2013   Essential hypertension, benign 01/26/2013       Review of Systems  Constitutional:  Negative for diaphoresis.  Eyes:  Negative for pain.  Respiratory:  Negative for shortness of breath.   Cardiovascular:  Negative for chest pain, palpitations and leg swelling.  Gastrointestinal:  Negative for abdominal pain.  Endocrine: Negative for polydipsia.  Skin:  Negative for rash.  Neurological:  Negative for dizziness, weakness and headaches.  Hematological:  Does not bruise/bleed easily.  All other systems reviewed and are negative.      Objective:   Physical Exam Cardiovascular:     Rate and Rhythm: Normal rate and regular rhythm.  Pulmonary:     Breath sounds: Normal breath sounds.  Musculoskeletal:     Right lower leg: Edema present.     Left lower leg: Edema present.  Skin:    Comments: 3cm  erythematous open sound on right lower medial side of leg.  Neurological:     General: No focal deficit present.     Mental Status: She is oriented to person, place, and time.  Psychiatric:        Mood and Affect: Mood normal.        Behavior: Behavior normal.    BP 123/78   Pulse (!) 58   Temp (!) 97.1 F (36.2 C) (Temporal)   Resp 20   LMP 01/27/1983   SpO2 93%   Xeroform dressing applied      Assessment & Plan:   Kylie Ward in today with chief complaint of Recheck legs   1. Cellulitis and abscess of leg Continue antibiotic until finished Increase lasix to bid-until seen on Thursday Elevate legs while sitting LEAVE dressing on until Thursday    The above assessment and management plan was discussed with the patient. The patient verbalized understanding of and has agreed to the management plan. Patient is aware to call the clinic if symptoms persist or worsen. Patient is aware when to return to the clinic for a follow-up visit. Patient educated on when it is appropriate to go to the emergency department.   [name], FNP

## 2023-08-11 ENCOUNTER — Ambulatory Visit: Payer: Medicare Other | Admitting: Family

## 2023-08-11 ENCOUNTER — Encounter: Payer: Self-pay | Admitting: Family

## 2023-08-11 VITALS — BP 115/67 | HR 56 | Temp 98.2°F | Ht 64.0 in

## 2023-08-11 DIAGNOSIS — Z794 Long term (current) use of insulin: Secondary | ICD-10-CM

## 2023-08-11 DIAGNOSIS — E1142 Type 2 diabetes mellitus with diabetic polyneuropathy: Secondary | ICD-10-CM | POA: Diagnosis not present

## 2023-08-11 DIAGNOSIS — I83009 Varicose veins of unspecified lower extremity with ulcer of unspecified site: Secondary | ICD-10-CM | POA: Diagnosis not present

## 2023-08-11 DIAGNOSIS — L97909 Non-pressure chronic ulcer of unspecified part of unspecified lower leg with unspecified severity: Secondary | ICD-10-CM | POA: Diagnosis not present

## 2023-08-11 LAB — LIPID PANEL
Cholesterol, Total: 143 mg/dL (ref 100–199)
HDL: 38 mg/dL — ABNORMAL LOW (ref >39)
LDL CALC COMMENT:: 3.8 ratio (ref 0.0–4.4)
LDL Chol Calc (NIH): 41 mg/dL (ref 0–99)
Triglycerides: 444 mg/dL — ABNORMAL HIGH (ref 0–149)
VLDL Cholesterol Cal: 64 mg/dL — ABNORMAL HIGH (ref 5–40)

## 2023-08-11 LAB — BENZODIAZEPINES,MS,WB/SP RFX
7-Aminoclonazepam: NEGATIVE ng/mL
Alprazolam: 73.8 ng/mL
Benzodiazepines Confirm: POSITIVE
Chlordiazepoxide: NEGATIVE
Clonazepam: NEGATIVE ng/mL
Desalkylflurazepam: NEGATIVE ng/mL
Desmethylchlordiazepoxide: NEGATIVE
Desmethyldiazepam: NEGATIVE ng/mL
Diazepam: NEGATIVE ng/mL
Flurazepam: NEGATIVE ng/mL
Lorazepam: NEGATIVE ng/mL
Midazolam: NEGATIVE ng/mL
Oxazepam: NEGATIVE ng/mL
Temazepam: NEGATIVE ng/mL
Triazolam: NEGATIVE ng/mL

## 2023-08-11 LAB — DRUG SCREEN 10 W/CONF, SERUM

## 2023-08-11 LAB — CBC WITH DIFFERENTIAL/PLATELET
Basophils Absolute: 0 10*3/uL (ref 0.0–0.2)
Basos: 0 %
EOS (ABSOLUTE): 0.2 10*3/uL (ref 0.0–0.4)
Eos: 2 %
Hematocrit: 33.2 % — ABNORMAL LOW (ref 34.0–46.6)
Hemoglobin: 10.6 g/dL — ABNORMAL LOW (ref 11.1–15.9)
Immature Grans (Abs): 0 10*3/uL (ref 0.0–0.1)
Immature Granulocytes: 0 %
Lymphocytes Absolute: 1.7 10*3/uL (ref 0.7–3.1)
Lymphs: 18 %
MCH: 30 pg (ref 26.6–33.0)
MCHC: 31.9 g/dL (ref 31.5–35.7)
MCV: 94 fL (ref 79–97)
Monocytes Absolute: 0.8 10*3/uL (ref 0.1–0.9)
Monocytes: 9 %
Neutrophils Absolute: 6.7 10*3/uL (ref 1.4–7.0)
Neutrophils: 71 %
Platelets: 218 10*3/uL (ref 150–450)
RBC: 3.53 x10E6/uL — ABNORMAL LOW (ref 3.77–5.28)
RDW: 12.9 % (ref 11.7–15.4)
WBC: 9.5 10*3/uL (ref 3.4–10.8)

## 2023-08-11 NOTE — Patient Instructions (Signed)
Venous Ulcer A venous ulcer is an irregular, shallow sore on your lower leg caused by poor circulation in your veins. Venous ulcer is the most common type of lower leg ulcer. You may have venous ulcers on one leg or on both legs. The area where this condition most often develops is around the ankles. A venous ulcer may last for a long time (chronic ulcer) or it may return repeatedly (recurrent ulcer). What are the causes? A venous ulcer may be caused by any condition that causes poor blood flow from your legs back to your heart. Veins have valves that help return blood to the heart. If these valves do not work properly: Blood can flow backward and pool in the lower legs. Blood can then leak out of your veins, which can irritate your skin or cause the skin to get darker over the calf/shin. Irritation or trauma can cause a break in the skin, which becomes a venous ulcer. What increases the risk? You are more likely to develop this condition if you: Are female. Are 75 years of age or older. Have had a leg ulcer in the past. Have varicose veins. Have clots in your lower leg veins (deep vein thrombosis). Have inflammation of your leg veins (phlebitis). Have had a recent pregnancy or history of multiple pregnancies. A family history of chronic venous insufficiency. Your risk may be higher if you are: Not active. Overweight. Using products that contain nicotine or tobacco. What are the signs or symptoms? The main symptom of this condition is an open sore near your ankle. Other symptoms may include: Swelling. Fluid leaking from the ulcer. Bleeding. Itching. A foul odor. Pain and swelling that gets worse when you stand up and feels better when you raise your leg. Changes in the skin, such as: The skin getting thicker. Blotchy skin. Darker skin over the calf. How is this diagnosed? Your health care provider will do a physical exam and ask about your medical history. Other tests may be done,  such as: Measuring blood pressure in your arms and legs. An ultrasound to measure blood flow in your leg veins. A CT scan to identify damaged valves. How is this treated? This condition may be treated by: Keeping your leg raised (elevated). Wearing a type of bandage (dressing) or stocking to compress the veins of your leg (compression therapy). Taking medicines to improve blood flow. Taking antibiotic medicines to treat infection. Cleaning your ulcer and removing any dead tissue from the wound (debridement). Placing various types of medicated dressings or wraps on your ulcer. Surgery to close the wound using a piece of skin taken from another area of your body (graft). This is only done for wounds that are deep or hard to heal. You may need to try different types of treatment to get your venous ulcer to heal. Healing may take a long time. You may need to see a specialist, such as a podiatrist or vascular specialist, to get testing and treatment. Follow these instructions at home: Medicines Take or apply over-the-counter and prescription medicines only as told by your provider. If you were prescribed antibiotics, take them as told by your provider. Do not stop taking your antibiotics even if you start to feel better. Ask your provider if you should take aspirin before long trips. Wound care Follow instructions from your provider about how to take care of your wound. Make sure you: Wash your hands with soap and water for at least 20 seconds before and after you change  your dressing. If soap and water are not available, use hand sanitizer. Change your dressing as told by your provider. If you had a skin graft, leave stitches (sutures) in place. These may need to stay in place for 2 weeks or longer. Ask when you should remove your dressing. If your dressing is dry and sticks to your leg when you try to remove it, moisten or wet the dressing with saline solution (salt and water) or water alone.  This is so the dressing can be removed without harming your skin or wound tissue. Check your wound every day for signs of infection. Have a caregiver do this if you are not able to do it yourself. Check for: More redness, swelling, or pain. More fluid or blood. Warmth. Pus or a bad smell. Activity Do not sit for a long time without moving. Get up to take short walks every 1-2 hours. This will improve blood flow and breathing. Ask for help if you feel weak or unsteady. Rest with your legs raised during the day. If possible, raise your legs above the level of your heart for 30 minutes, 3-4 times a day, or as told by your provider. Do not sit with your legs crossed. Return to your normal activities as told by your provider. Ask your provider what activities are safe for you. General instructions  Wear elastic stockings, compression stockings, or support hose as told by your provider. Raise the foot of your bed as told. Do not use any products that contain nicotine or tobacco. These products include cigarettes, chewing tobacco, and vaping devices, such as e-cigarettes. If you need help quitting, ask your provider. Try to eat a heart healthy and lower salt (sodium) diet. Maintain a healthy body weight. Keep all follow-up visits. Your provider will check if your ulcer is healing and change treatments if needed. Contact a health care provider if: Your ulcer is getting larger or is not healing. Your pain gets worse. You have any signs of infection. You have a fever. This information is not intended to replace advice given to you by your health care provider. Make sure you discuss any questions you have with your health care provider. Document Revised: 06/07/2022 Document Reviewed: 06/07/2022 Elsevier Patient Education  2024 [employer].

## 2023-08-11 NOTE — Progress Notes (Signed)
   Subjective:    Patient ID: Kylie Ward, female    DOB: [birth year], 77 y.o.   MRN: [id number]  Chief Complaint  Patient presents with   Wound Check   Fatigue    12- WEEKS NO ENERGY    PT presents to the office today for wound care for right lower leg wound. She has been getting dressing changes. She can not tolerated unna boots.   She is uncontrolled DM. Her last A1C was 9.9.  Wound Check She was originally treated more than 14 days ago. Her temperature was unmeasured prior to arrival. There has been clear discharge from the wound. The redness has improved. The swelling has not changed. There is no pain present.      Review of Systems  All other systems reviewed and are negative.      Objective:   Physical Exam Vitals reviewed.  Constitutional:      General: She is not in acute distress.    Appearance: She is well-developed. She is obese.  HENT:     Head: Normocephalic and atraumatic.     Right Ear: External ear normal.  Eyes:     Pupils: Pupils are equal, round, and reactive to light.  Neck:     Thyroid: No thyromegaly.  Cardiovascular:     Rate and Rhythm: Normal rate and regular rhythm.     Heart sounds: Normal heart sounds. No murmur heard. Pulmonary:     Effort: Pulmonary effort is normal. No respiratory distress.     Breath sounds: Normal breath sounds. No wheezing.  Abdominal:     General: Bowel sounds are normal. There is no distension.     Palpations: Abdomen is soft.     Tenderness: There is no abdominal tenderness.  Musculoskeletal:        General: No tenderness. Normal range of motion.     Cervical back: Normal range of motion and neck supple.  Skin:    General: Skin is warm and dry.     Comments: Ulcer on right medial lower leg approx 2X0.8 cm and 0.2X0.3 cm  Neurological:     Mental Status: She is alert and oriented to person, place, and time.     Cranial Nerves: No cranial nerve deficit.     Deep Tendon Reflexes: Reflexes are normal and  symmetric.  Psychiatric:        Behavior: Behavior normal.        Thought Content: Thought content normal.        Judgment: Judgment normal.      BP 115/67   Pulse (!) 56   Temp 98.2 F (36.8 C) (Temporal)   Ht 5\' 4"  (1.626 m)   LMP 01/27/1983   SpO2 98%   BMI 28.15 kg/m       Assessment & Plan:  Kylie Ward comes in today with chief complaint of Wound Check and Fatigue (12- WEEKS NO ENERGY )   Diagnosis and orders addressed:  1. Type 2 diabetes mellitus with diabetic polyneuropathy, with long-term current use of insulin (HCC)   2. Venous ulcer (HCC)   Continue Bactrim  Continue Lasix 60 mg daily, she completed 80 mg for 5 days Will recheck BMP today Wound dressed Elevated legs when possible  Keep clean and dry Discussed importance of getting control of glucose for wound healing!!   Jannifer Rodney, FNP

## 2023-08-16 ENCOUNTER — Telehealth: Payer: Self-pay | Admitting: Nurse Practitioner

## 2023-08-16 ENCOUNTER — Other Ambulatory Visit: Payer: Self-pay | Admitting: Family

## 2023-08-16 ENCOUNTER — Telehealth: Payer: Self-pay

## 2023-08-16 DIAGNOSIS — R7989 Other specified abnormal findings of blood chemistry: Secondary | ICD-10-CM

## 2023-08-16 DIAGNOSIS — I340 Nonrheumatic mitral (valve) insufficiency: Secondary | ICD-10-CM

## 2023-08-16 NOTE — Telephone Encounter (Signed)
How did leg look at last visit? Does she need to see MMM next week?

## 2023-08-16 NOTE — Telephone Encounter (Signed)
-----   Message from Sharlene Dory sent at 08/09/2023  1:35 PM EDT ----- Ms. Lammert,  Previous echocardiogram showed LVEF 25 to 30%.  More recent echo showed an improvement in heart pump function to 40 to 45%.  You have mild to moderately enlarged upper left chamber of your heart and a mild to moderate leak of your mitral valve as well as mild narrowing of the mitral valve.  Severe calcification of the mitral valve.  Lets plan to repeat an echo in a year.  No medication changes at this time.  Sharlene Dory, PA-C

## 2023-08-16 NOTE — Telephone Encounter (Signed)
Pt can be seen next week by PCP. However, needs to call if increased redness, swelling, pain, or discharge. Continue dressing changes at home.

## 2023-08-16 NOTE — Telephone Encounter (Signed)
Spoke with husband Hinton Dyer (on [place]) who verbalized understanding. Repeat order for next year placed at this time.

## 2023-08-16 NOTE — Telephone Encounter (Signed)
Appt made

## 2023-08-19 ENCOUNTER — Other Ambulatory Visit: Payer: Medicare Other

## 2023-08-19 DIAGNOSIS — R7989 Other specified abnormal findings of blood chemistry: Secondary | ICD-10-CM | POA: Diagnosis not present

## 2023-08-19 LAB — BMP8+EGFR
BUN/Creatinine Ratio: 17 (ref 12–28)
BUN: 34 mg/dL — ABNORMAL HIGH (ref 8–27)
Calcium: 8.6 mg/dL — ABNORMAL LOW (ref 8.7–10.3)
Chloride: 101 mmol/L (ref 96–106)
Creatinine, Ser: 1.96 mg/dL — ABNORMAL HIGH (ref 0.57–1.00)
Glucose: 141 mg/dL — ABNORMAL HIGH (ref 70–99)
Potassium: 5.4 mmol/L — ABNORMAL HIGH (ref 3.5–5.2)
Sodium: 135 mmol/L (ref 134–144)
eGFR: 26 mL/min/1.73 — ABNORMAL LOW (ref >59)

## 2023-08-20 LAB — BMP8+EGFR
BUN/Creatinine Ratio: 17 (ref 12–28)
BUN: 47 mg/dL — ABNORMAL HIGH (ref 8–27)
CO2: 19 mmol/L — ABNORMAL LOW (ref 20–29)
Calcium: 9.2 mg/dL (ref 8.7–10.3)
Chloride: 100 mmol/L (ref 96–106)
Creatinine, Ser: 2.79 mg/dL — ABNORMAL HIGH (ref 0.57–1.00)
Glucose: 110 mg/dL — ABNORMAL HIGH (ref 70–99)
Potassium: 5.8 mmol/L — ABNORMAL HIGH (ref 3.5–5.2)
Sodium: 137 mmol/L (ref 134–144)
eGFR: 17 mL/min/{1.73_m2} — ABNORMAL LOW (ref >59)

## 2023-08-22 ENCOUNTER — Emergency Department (HOSPITAL_COMMUNITY)
Admission: EM | Admit: 2023-08-22 | Discharge: 2023-08-22 | Disposition: A | Discharge status: Home / Self Care | Payer: Medicare Other | Attending: Emergency Medicine | Admitting: Emergency Medicine

## 2023-08-22 ENCOUNTER — Other Ambulatory Visit: Payer: Self-pay

## 2023-08-22 ENCOUNTER — Encounter (HOSPITAL_COMMUNITY): Payer: Self-pay

## 2023-08-22 DIAGNOSIS — E119 Type 2 diabetes mellitus without complications: Secondary | ICD-10-CM | POA: Insufficient documentation

## 2023-08-22 DIAGNOSIS — R7989 Other specified abnormal findings of blood chemistry: Secondary | ICD-10-CM | POA: Diagnosis not present

## 2023-08-22 DIAGNOSIS — Z7984 Long term (current) use of oral hypoglycemic drugs: Secondary | ICD-10-CM | POA: Diagnosis not present

## 2023-08-22 DIAGNOSIS — E875 Hyperkalemia: Secondary | ICD-10-CM | POA: Diagnosis not present

## 2023-08-22 DIAGNOSIS — Z79899 Other long term (current) drug therapy: Secondary | ICD-10-CM | POA: Insufficient documentation

## 2023-08-22 DIAGNOSIS — Z7901 Long term (current) use of anticoagulants: Secondary | ICD-10-CM | POA: Diagnosis not present

## 2023-08-22 DIAGNOSIS — I10 Essential (primary) hypertension: Secondary | ICD-10-CM | POA: Diagnosis not present

## 2023-08-22 DIAGNOSIS — Z794 Long term (current) use of insulin: Secondary | ICD-10-CM | POA: Diagnosis not present

## 2023-08-22 LAB — CBC WITH DIFFERENTIAL/PLATELET
Abs Immature Granulocytes: 0.04 10*3/uL (ref 0.00–0.07)
Basophils Absolute: 0 10*3/uL (ref 0.0–0.1)
Basophils Relative: 1 %
Eosinophils Absolute: 0.1 10*3/uL (ref 0.0–0.5)
Eosinophils Relative: 1 %
HCT: 36.8 % (ref 36.0–46.0)
Hemoglobin: 11.7 g/dL — ABNORMAL LOW (ref 12.0–15.0)
Immature Granulocytes: 1 %
Lymphocytes Relative: 16 %
Lymphs Abs: 1.2 10*3/uL (ref 0.7–4.0)
MCH: 29.7 pg (ref 26.0–34.0)
MCHC: 31.8 g/dL (ref 30.0–36.0)
MCV: 93.4 fL (ref 80.0–100.0)
Monocytes Absolute: 0.5 10*3/uL (ref 0.1–1.0)
Monocytes Relative: 6 %
Neutro Abs: 5.9 10*3/uL (ref 1.7–7.7)
Neutrophils Relative %: 75 %
Platelets: 242 10*3/uL (ref 150–400)
RBC: 3.94 MIL/uL (ref 3.87–5.11)
RDW: 12.5 % (ref 11.5–15.5)
WBC: 7.7 10*3/uL (ref 4.0–10.5)
nRBC: 0 % (ref 0.0–0.2)

## 2023-08-22 LAB — COMPREHENSIVE METABOLIC PANEL
ALT: 20 U/L (ref 0–44)
AST: 18 U/L (ref 15–41)
Albumin: 3.8 g/dL (ref 3.5–5.0)
Alkaline Phosphatase: 72 U/L (ref 38–126)
Anion gap: 6 (ref 5–15)
BUN: 44 mg/dL — ABNORMAL HIGH (ref 8–23)
CO2: 22 mmol/L (ref 22–32)
Calcium: 9 mg/dL (ref 8.9–10.3)
Chloride: 104 mmol/L (ref 98–111)
Creatinine, Ser: 1.88 mg/dL — ABNORMAL HIGH (ref 0.44–1.00)
GFR, Estimated: 28 mL/min — ABNORMAL LOW (ref >60)
Glucose, Bld: 298 mg/dL — ABNORMAL HIGH (ref 70–99)
Potassium: 5.5 mmol/L — ABNORMAL HIGH (ref 3.5–5.1)
Sodium: 132 mmol/L — ABNORMAL LOW (ref 135–145)
Total Bilirubin: 0.8 mg/dL (ref 0.3–1.2)
Total Protein: 7.1 g/dL (ref 6.5–8.1)

## 2023-08-22 MED ORDER — SODIUM ZIRCONIUM CYCLOSILICATE 10 G PO PACK
10.0000 g | PACK | Freq: Once | ORAL | Status: AC
  Administered 2023-08-22: 10 g via ORAL
  Filled 2023-08-22: qty 1

## 2023-08-22 NOTE — ED Triage Notes (Addendum)
Pt sent by PCP for elevated kidney levels. Pt denies any complaints. Per pt's husband, pt has cellulitis of RLE. Pt's RLE is erythematous and as 1+ swelling

## 2023-08-22 NOTE — Discharge Instructions (Signed)
As we discussed, your workup in the ER today showed that your lab work is significantly improved from the labs you had drawn by your primary doctor.  I do suspect that your lab changes are due to the Bactrim that you have been prescribed for your cellulitis.  I recommend that you avoid this medication in the future.  I also recommend that you discontinue the spironolactone that you have been taking as it can raise your potassium as well.  It is extremely important that you see your doctor tomorrow and have repeat blood work drawn to ensure that your labs continue to normalize.  Return if development of any new or worsening symptoms.

## 2023-08-22 NOTE — ED Provider Triage Note (Signed)
Emergency Medicine Provider Triage Evaluation Note  Kylie Ward , a 77 y.o. female  was evaluated in triage.  Pt complains of abnormal lab. Seen at [facility] 10/18. Patient called to report kidney function was "through the roof". She feels well. Reports cellulitis to right leg.   Review of Systems  Positive: Abnormal lab Negative: Fever, nausea, chest pain  Physical Exam  BP (!) 140/38 (BP Location: Right Arm)   Pulse (!) 58   Temp 98.2 F (36.8 C)   Resp 17   Ht 5\' 4"  (1.626 m)   Wt 74.4 kg   LMP 01/27/1983   SpO2 98%   BMI 28.15 kg/m  Gen:   Awake, no distress   Resp:  Normal effort  MSK:   Moves extremities without difficulty Erythema to distal right lower leg.  Other:    Medical Decision Making  Medically screening exam initiated at 12:28 PM.  Appropriate orders placed.  Kylie Ward was informed that the remainder of the evaluation will be completed by another provider, this initial triage assessment does not replace that evaluation, and the importance of remaining in the ED until their evaluation is complete.  Labs pending.   Elpidio Anis, PA-C 08/22/23 1231

## 2023-08-22 NOTE — ED Provider Notes (Signed)
[place] EMERGENCY DEPARTMENT AT [facility] Provider Note   CSN: [id number] Arrival date & time: 08/22/23  1042     History  Chief Complaint  Patient presents with   Abnormal Lab    Kylie Ward is a 77 y.o. female.  Patient with history of diabetes, hypertension, hyperlipidemia presents today with complaints of abnormal lab.  She states that she had routine blood work checked at her primary doctor a few days ago and received a call today telling her to go to the ER because her creatinine was elevated.  She states that she has felt at her baseline over the past few days. She does note that she has been on Bactrim for her right leg cellulitis. She did stop this medication on Wednesday, 5 days ago. Her cellulitis has markedly improved.  Denies fevers or chills.  She has been urinating regularly and denies any swelling in her extremities or her abdomen.  Denies any chest pain or shortness of breath.  Does note she is also on spironolactone.  The history is provided by the patient. No language interpreter was used.  Abnormal Lab      Home Medications Prior to Admission medications   Medication Sig Start Date End Date Taking? Authorizing Provider  Accu-Chek FastClix Lancets MISC CHECK BLOOD SUGAR UP TO 3 TIMES A DAY AS DIRECTED Dx E11.42 02/15/22   Daphine Deutscher, Mary-Margaret, FNP  ALPRAZolam Prudy Feeler) 1 MG tablet Take 1 tablet (1 mg total) by mouth 2 (two) times daily. 05/03/23   Daphine Deutscher, Mary-Margaret, FNP  atorvastatin (LIPITOR) 40 MG tablet Take 1 tablet (40 mg total) by mouth daily. 05/03/23   Daphine Deutscher Mary-Margaret, FNP  Blood Glucose Monitoring Suppl (ACCU-CHEK NANO SMARTVIEW) w/Device KIT CHECK BLOOD SUGER UP TO 3 TIMES A DAY 06/27/18   Daphine Deutscher, Mary-Margaret, FNP  clopidogrel (PLAVIX) 75 MG tablet Take 1 tablet (75 mg total) by mouth daily. 05/03/23   Daphine Deutscher Mary-Margaret, FNP  Continuous Blood Gluc Receiver (FREESTYLE LIBRE 2 READER) DEVI Use to test blood sugar continuously.  DX: E11.65 06/24/22   Daphine Deutscher Mary-Margaret, FNP  Continuous Glucose Sensor (FREESTYLE LIBRE 2 SENSOR) MISC CHANGE SENSOR ON BACK OF ARM EVERY 14 DAYS AS DIRECTED TO CHECK BLOOD SUGAR CONTINUOUSLY 06/30/23   Daphine Deutscher, Mary-Margaret, FNP  donepezil (ARICEPT) 10 MG tablet Take 1 tablet (10 mg total) by mouth at bedtime. 05/03/23   Daphine Deutscher Mary-Margaret, FNP  Elastic Bandages & Supports (V-2 HIGH COMPRESSION HOSE) MISC 1 each by Does not apply route daily. 02/12/16   Daphine Deutscher, Mary-Margaret, FNP  escitalopram (LEXAPRO) 20 MG tablet Take 1 tablet (20 mg total) by mouth daily. 05/03/23   Daphine Deutscher, Mary-Margaret, FNP  fenofibrate 160 MG tablet Take 1 tablet (160 mg total) by mouth daily. 05/03/23   Daphine Deutscher Mary-Margaret, FNP  furosemide (LASIX) 40 MG tablet Take 1 tablet (40 mg total) by mouth daily. 05/03/23   Daphine Deutscher, Mary-Margaret, FNP  glucose blood (ACCU-CHEK SMARTVIEW) test strip CHECK BLOOD SUGAR UP TO 3 TIMES A DAY AS DIRECTED 07/10/21   Daphine Deutscher, Mary-Margaret, FNP  insulin aspart (NOVOLOG) 100 UNIT/ML injection Inject 0-10 Units into the skin See admin instructions. 0-10 units per sliding scale, three times daily before meals. 05/03/23   Daphine Deutscher, Mary-Margaret, FNP  insulin glargine (LANTUS) 100 UNIT/ML injection Inject 0.4 mLs (40 Units total) into the skin at bedtime. 08/08/23 08/15/25  Daphine Deutscher, Mary-Margaret, FNP  Insulin Pen Needle (SURE COMFORT PEN NEEDLES) 31G X 8 MM MISC USE TO inject lantus ONCE daily 12/04/19   Daphine Deutscher,  Mary-Margaret, FNP  lisinopril (ZESTRIL) 10 MG tablet Take 1 tablet (10 mg total) by mouth daily. 08/05/23 09/04/23  Daphine Deutscher, Mary-Margaret, FNP  memantine (NAMENDA) 10 MG tablet Take 1 tablet (10 mg total) by mouth 2 (two) times daily. 08/05/23   Daphine Deutscher Mary-Margaret, FNP  Menthol-Zinc Oxide (CALMOSEPTINE) 0.44-20.6 % OINT Apply heavily to affected area 08/05/23   Daphine Deutscher, Mary-Margaret, FNP  metFORMIN (GLUCOPHAGE-XR) 750 MG 24 hr tablet TAKE ONE TABLET BY MOUTH TWICE DAILY WITH MEAL(S) 08/05/23   Daphine Deutscher,  Mary-Margaret, FNP  metoprolol succinate (TOPROL-XL) 25 MG 24 hr tablet Take 1 tablet (25 mg total) by mouth daily. 04/21/23   Sharlene Dory, PA-C  Multiple Vitamins-Minerals (ONE-A-DAY WOMENS 50+) TABS Take 1 tablet by mouth daily.    [provider]  nystatin cream (MYCOSTATIN) Apply 1 Application topically 2 (two) times daily. 07/11/23   Daphine Deutscher, Mary-Margaret, FNP  polyethylene glycol powder (GLYCOLAX/MIRALAX) 17 GM/SCOOP powder Take 17 g by mouth daily. 06/15/23   Sonny Masters, FNP  spironolactone (ALDACTONE) 25 MG tablet Take 0.5 tablets (12.5 mg total) by mouth daily. 08/04/23   Tonny Bollman, MD  sulfamethoxazole-trimethoprim (BACTRIM DS) 800-160 MG tablet Take 1 tablet by mouth 2 (two) times daily. 08/05/23   Daphine Deutscher Mary-Margaret, FNP  traZODone (DESYREL) 50 MG tablet Take 1 tablet (50 mg total) by mouth at bedtime. 08/05/23   Bennie Pierini, FNP      Allergies    Crestor [rosuvastatin] and Niaspan [niacin er (antihyperlipidemic)]    Review of Systems   Review of Systems  All other systems reviewed and are negative.   Physical Exam Updated Vital Signs BP (!) 140/59 (BP Location: Right Arm)   Pulse 60   Temp 97.6 F (36.4 C) (Oral)   Resp 15   Ht 5\' 4"  (1.626 m)   Wt 74.4 kg   LMP 01/27/1983   SpO2 98%   BMI 28.15 kg/m  Physical Exam Vitals and nursing note reviewed. Exam conducted with a chaperone present.  Constitutional:      General: She is not in acute distress.    Appearance: Normal appearance. She is normal weight. She is not ill-appearing, toxic-appearing or diaphoretic.  HENT:     Head: Normocephalic and atraumatic.  Cardiovascular:     Rate and Rhythm: Normal rate and regular rhythm.     Heart sounds: Normal heart sounds.  Pulmonary:     Effort: Pulmonary effort is normal. No respiratory distress.     Breath sounds: Normal breath sounds.  Abdominal:     General: Abdomen is flat.     Palpations: Abdomen is soft.     Tenderness: There is no  abdominal tenderness.  Genitourinary:    Comments: Minor skin breakdown in the sacral area without any signs of infection. Musculoskeletal:        General: Normal range of motion.     Cervical back: Normal range of motion.     Comments: Trace erythema present in a 3 cm x 2 cm area of the right lower extremity. Appears markedly improved from images that patient's daughter provides at the bedside.  No fluctuance, induration, erythema, or warmth  Skin:    General: Skin is warm and dry.  Neurological:     General: No focal deficit present.     Mental Status: She is alert.  Psychiatric:        Mood and Affect: Mood normal.        Behavior: Behavior normal.     ED Results /  Procedures / Treatments   Labs (all labs ordered are listed, but only abnormal results are displayed) Labs Reviewed  COMPREHENSIVE METABOLIC PANEL - Abnormal; Notable for the following components:      Result Value   Sodium 132 (*)    Potassium 5.5 (*)    Glucose, Bld 298 (*)    BUN 44 (*)    Creatinine, Ser 1.88 (*)    GFR, Estimated 28 (*)    All other components within normal limits  CBC WITH DIFFERENTIAL/PLATELET - Abnormal; Notable for the following components:   Hemoglobin 11.7 (*)    All other components within normal limits    EKG None  Radiology No results found.  Procedures Procedures    Medications Ordered in ED Medications  sodium zirconium cyclosilicate (LOKELMA) packet 10 g (10 g Oral Given 08/22/23 1525)    ED Course/ Medical Decision Making/ A&P                                 Medical Decision Making Amount and/or Complexity of Data Reviewed Labs: ordered.  Risk Prescription drug management.   This patient is a 77 y.o. female who presents to the ED for concern of elevated creatinine and outpatient blood work, this involves an extensive number of treatment options, and is a complaint that carries with it a high risk of complications and morbidity. The emergent differential  diagnosis prior to evaluation includes, but is not limited to,  medication reaction, AKI, CKD, renal obstruction . This is not an exhaustive differential.   Past Medical History / Co-morbidities / Social History:  has a past medical history of Anxiety, Diabetes mellitus without complication (HCC), Hyperlipidemia, Hypertension, and Stroke (HCC).  Additional history: Chart reviewed. Pertinent results include: creatinine 3 days ago was 2.79, K was 5.8.  Patient is on spironolactone and was taking Bactrim for cellulitis.  Physical Exam: Physical exam performed. The pertinent findings include: Left leg wound noninfectious appearing, markedly improved from previous.  NA 132.  K 5.5.  Glucose 298.  BUN is 44, creatinine 1.88  Lab Tests: I ordered, and personally interpreted labs.  The pertinent results include: No leukocytosis.  Hemoglobin 11.7, improved from previous.   Medications: I ordered medication including lokelma  for hyperkalemia. Reevaluation of the patient after these medicines showed that the patient improved. I have reviewed the patients home medicines and have made adjustments as needed.   Disposition: After consideration of the diagnostic results and the patients response to treatment, I feel that emergency department workup does not suggest an emergent condition requiring admission or immediate intervention beyond what has been performed at this time. The plan is: Discharge with close outpatient follow-up and return precautions.  Patient's elevation in her creatinine is likely due to the Bactrim she has been taking.  She is also on spironolactone which can raise potassium.  She is asymptomatic, not showing any signs of fluid overload or obstructing process causing creatinine elevation.  Her creatinine has markedly improved from previous, likely due to discontinuing the Bactrim.  I did offer admission to this patient given her hyperkalemia and elevated creatinine, however she has an  appointment tomorrow with her primary doctor for follow-up and would prefer to follow-up outpatient.  Given her marked improvement in labs and lack of symptoms, this is reasonable.  Given 1 dose of Lokelma for her hyperkalemia.  Emphasized importance of having these labs rechecked tomorrow by her primary doctor.  Evaluation and diagnostic testing in the emergency department does not suggest an emergent condition requiring admission or immediate intervention beyond what has been performed at this time.  Plan for discharge with close PCP follow-up.  Patient is understanding and amenable with plan, educated on red flag symptoms that would prompt immediate return.  Patient discharged in stable condition.   This is a shared visit with supervising physician Dr. Earlene Plater who has independently evaluated patient & provided guidance in evaluation/management/disposition, in agreement with care   Final Clinical Impression(s) / ED Diagnoses Final diagnoses:  Elevated serum creatinine  Hyperkalemia    Rx / DC Orders ED Discharge Orders     None     An After Visit Summary was printed and given to the patient.     Vear Clock 08/22/23 1552    Laurence Spates, MD 08/23/23 [phone]

## 2023-08-23 ENCOUNTER — Ambulatory Visit: Payer: Medicare Other | Admitting: Nurse Practitioner

## 2023-08-25 ENCOUNTER — Ambulatory Visit (INDEPENDENT_AMBULATORY_CARE_PROVIDER_SITE_OTHER): Payer: Medicare Other | Admitting: Nurse Practitioner

## 2023-08-25 ENCOUNTER — Encounter: Payer: Self-pay | Admitting: Nurse Practitioner

## 2023-08-25 VITALS — BP 129/72 | HR 65 | Temp 98.6°F | Resp 20

## 2023-08-25 DIAGNOSIS — S81801S Unspecified open wound, right lower leg, sequela: Secondary | ICD-10-CM | POA: Diagnosis not present

## 2023-08-25 DIAGNOSIS — R748 Abnormal levels of other serum enzymes: Secondary | ICD-10-CM | POA: Diagnosis not present

## 2023-08-25 DIAGNOSIS — Z09 Encounter for follow-up examination after completed treatment for conditions other than malignant neoplasm: Secondary | ICD-10-CM

## 2023-08-25 NOTE — Progress Notes (Signed)
   Subjective:    Patient ID: Kylie Ward, female    DOB: 07-May-1946, 77 y.o.   MRN: [id number]   Chief Complaint: hospital follow up  HPI  Patient was sent to the hospital with creatine of 2.79. she was not admitted. Sent home and was told needed repeat labs. Labs were repeat 3 days ago and creatine was 1.88.  Has a wound on right lower leg that husband says is inmproving Patient Active Problem List   Diagnosis Date Noted   Primary insomnia 05/03/2023   Chronic midline low back pain without sciatica 05/03/2023   Coronary artery disease involving native coronary artery of native heart with unstable angina pectoris (HCC) 04/08/2023   Dementia without behavioral disturbance (HCC) 04/07/2023   Drug-induced myopathy 10/30/2020   Type 2 diabetes mellitus with diabetic polyneuropathy, with long-term current use of insulin (HCC) 03/06/2018   Atherosclerosis of aorta (HCC) 09/04/2016   Peripheral edema 11/13/2015   GAD (generalized anxiety disorder) 08/12/2015   BMI 31.0-31.9,adult 05/08/2015   Carotid artery stenosis, symptomatic 01/14/2014   Late effect of cerebrovascular accident (CVA) 12/10/2013   Hyperlipidemia associated with type 2 diabetes mellitus (HCC) 01/26/2013   Essential hypertension, benign 01/26/2013       Review of Systems  Constitutional:  Negative for diaphoresis.  Eyes:  Negative for pain.  Respiratory:  Negative for shortness of breath.   Cardiovascular:  Negative for chest pain, palpitations and leg swelling.  Gastrointestinal:  Negative for abdominal pain.  Endocrine: Negative for polydipsia.  Skin:  Negative for rash.  Neurological:  Negative for dizziness, weakness and headaches.  Hematological:  Does not bruise/bleed easily.  All other systems reviewed and are negative.      Objective:   Physical Exam Constitutional:      Appearance: Normal appearance.  Cardiovascular:     Rate and Rhythm: Normal rate and regular rhythm.     Heart sounds:  Normal heart sounds.  Pulmonary:     Effort: Pulmonary effort is normal.     Breath sounds: Normal breath sounds.  Skin:    General: Skin is warm.     Comments: 3cm mildly erythematous area on right lower leg- no drainage  Neurological:     General: No focal deficit present.     Mental Status: She is alert and oriented to person, place, and time.  Psychiatric:        Mood and Affect: Mood normal.        Behavior: Behavior normal.    BP 129/72   Pulse 65   Temp 98.6 F (37 C) (Oral)   Resp 20   LMP 01/27/1983   SpO2 95%         Assessment & Plan:   Kylie Ward in today with chief complaint of No chief complaint on file.   1. Elevated creatine kinase Labs pending - BMP8+EGFR  2. Leg wound, right, sequela Change dressing every other day Keep clean Do not allow patient to pick or scratch at area  3. Hospital discharge follow-up Hospital records reviewed    The above assessment and management plan was discussed with the patient. The patient verbalized understanding of and has agreed to the management plan. Patient is aware to call the clinic if symptoms persist or worsen. Patient is aware when to return to the clinic for a follow-up visit. Patient educated on when it is appropriate to go to the emergency department.   [name], FNP

## 2023-08-26 LAB — BMP8+EGFR
BUN/Creatinine Ratio: 24 (ref 12–28)
BUN: 41 mg/dL — ABNORMAL HIGH (ref 8–27)
CO2: 21 mmol/L (ref 20–29)
Calcium: 9.5 mg/dL (ref 8.7–10.3)
Chloride: 101 mmol/L (ref 96–106)
Creatinine, Ser: 1.68 mg/dL — ABNORMAL HIGH (ref 0.57–1.00)
Glucose: 211 mg/dL — ABNORMAL HIGH (ref 70–99)
Potassium: 5.1 mmol/L (ref 3.5–5.2)
Sodium: 137 mmol/L (ref 134–144)
eGFR: 32 mL/min/{1.73_m2} — ABNORMAL LOW (ref >59)

## 2023-09-05 ENCOUNTER — Ambulatory Visit (INDEPENDENT_AMBULATORY_CARE_PROVIDER_SITE_OTHER): Payer: Medicare Other | Admitting: Nurse Practitioner

## 2023-09-05 ENCOUNTER — Encounter: Payer: Self-pay | Admitting: Nurse Practitioner

## 2023-09-05 VITALS — BP 88/63 | HR 58 | Temp 97.1°F | Resp 20

## 2023-09-05 DIAGNOSIS — S80821D Blister (nonthermal), right lower leg, subsequent encounter: Secondary | ICD-10-CM

## 2023-09-05 DIAGNOSIS — S80821A Blister (nonthermal), right lower leg, initial encounter: Secondary | ICD-10-CM

## 2023-09-05 NOTE — Progress Notes (Signed)
   Subjective:    Patient ID: Kylie Ward, female    DOB: July 09, 1946, 77 y.o.   MRN: [id number]   Chief Complaint: right leg warm  HPI  Patient in to have right leg wound looked at. They have been keeping it covered at home to keep her from picking at it. No drainage. Patient Active Problem List   Diagnosis Date Noted   Primary insomnia 05/03/2023   Chronic midline low back pain without sciatica 05/03/2023   Coronary artery disease involving native coronary artery of native heart with unstable angina pectoris (HCC) 04/08/2023   Dementia without behavioral disturbance (HCC) 04/07/2023   Drug-induced myopathy 10/30/2020   Type 2 diabetes mellitus with diabetic polyneuropathy, with long-term current use of insulin (HCC) 03/06/2018   Atherosclerosis of aorta (HCC) 09/04/2016   Peripheral edema 11/13/2015   GAD (generalized anxiety disorder) 08/12/2015   BMI 31.0-31.9,adult 05/08/2015   Carotid artery stenosis, symptomatic 01/14/2014   Late effect of cerebrovascular accident (CVA) 12/10/2013   Hyperlipidemia associated with type 2 diabetes mellitus (HCC) 01/26/2013   Essential hypertension, benign 01/26/2013       Review of Systems  Constitutional:  Negative for diaphoresis.  Eyes:  Negative for pain.  Respiratory:  Negative for shortness of breath.   Cardiovascular:  Negative for chest pain, palpitations and leg swelling.  Gastrointestinal:  Negative for abdominal pain.  Endocrine: Negative for polydipsia.  Skin:  Negative for rash.  Neurological:  Negative for dizziness, weakness and headaches.  Hematological:  Does not bruise/bleed easily.  All other systems reviewed and are negative.      Objective:   Physical Exam Constitutional:      Appearance: Normal appearance.  Cardiovascular:     Rate and Rhythm: Normal rate and regular rhythm.     Heart sounds: Normal heart sounds.  Pulmonary:     Effort: Pulmonary effort is normal.     Breath sounds: Normal breath  sounds.  Skin:    Comments: Small vesicular lesion right lower leg with mild surrounding erythema. No drainage. Cool to touch  Neurological:     General: No focal deficit present.     Mental Status: She is alert and oriented to person, place, and time.  Psychiatric:        Mood and Affect: Mood normal.        Behavior: Behavior normal.    BP (!) 88/63   Pulse (!) 58   Temp (!) 97.1 F (36.2 C) (Temporal)   Resp 20   LMP 01/27/1983         Assessment & Plan:   Kylie Ward in today with chief complaint of Recheck wound on leg   1. Blister of right lower leg, initial encounter Cover at night Do not pick or scratch at area RTO as scheduled in december    The above assessment and management plan was discussed with the patient. The patient verbalized understanding of and has agreed to the management plan. Patient is aware to call the clinic if symptoms persist or worsen. Patient is aware when to return to the clinic for a follow-up visit. Patient educated on when it is appropriate to go to the emergency department.   [name], FNP

## 2023-09-05 NOTE — Progress Notes (Deleted)
Subjective:    Patient ID: Kylie Ward, female    DOB: 1946/01/14, 77 y.o.   MRN: [id number]   Chief Complaint: medical management of chronic issues     HPI:  Kylie Ward is a 77 y.o. who identifies as a female who was assigned female at birth.   Social history: Lives with: husband who is his caregiver Work history: retired   Water engineer in today for follow up of the following chronic medical issues:  1. Essential hypertension, benign No c/o chest pai, sob or headache. Does not check blood pressure at home. BP Readings from Last 3 Encounters:  08/25/23 129/72  08/22/23 (!) 140/59  08/11/23 115/67     2. Atherosclerosis of aorta (HCC) 3. Coronary artery disease involving native coronary artery of native heart with unstable angina pectoris [facility]) Last saw cardiology on 08/04/23. No change made to plan of care.   4. Hyperlipidemia associated with type 2 diabetes mellitus (HCC) She eats whatever her  husband fixes her to eat. Lab Results  Component Value Date   CHOL 143 08/05/2023   HDL 38 (L) 08/05/2023   LDLCALC 41 08/05/2023   TRIG 444 (H) 08/05/2023   CHOLHDL 3.8 08/05/2023     5. Type 2 diabetes mellitus with diabetic polyneuropathy, with long-term current use of insulin (HCC) Fasting blood sugars are running around ***. We increased lantus to 40u at last visit. Lab Results  Component Value Date   HGBA1C 9.9 (H) 08/05/2023     6. Dementia without behavioral disturbance (HCC) Her husband says she is worsening. Gets confused easily. Repeats herself a lot. Still recognizes people she sees on a regular basis.  7. Late effect of cerebrovascular accident (CVA) No permanent effects  8. GAD (generalized anxiety disorder) Husband gives her lantus BID to keep her calm ***  9. Peripheral edema Has had lots of edema with cellulitis lately. Has had  to have unna boots. Has wound on right lower leg that is slowly improving  10. Primary insomnia She is on  trazadone  11. BMI 31.0-31.9,adult No recent weight changes ***   New complaints: ***  Allergies  Allergen Reactions   Crestor [Rosuvastatin] Other (See Comments)    Myalgias and arthralgias to the point she had difficulty walking. She was tolerant of lipitor from the family's recall of events.    Niaspan [Niacin Er (Antihyperlipidemic)] Itching    Skin burning   Outpatient Encounter Medications as of 09/05/2023  Medication Sig   Accu-Chek FastClix Lancets MISC CHECK BLOOD SUGAR UP TO 3 TIMES A DAY AS DIRECTED Dx E11.42   ALPRAZolam (XANAX) 1 MG tablet Take 1 tablet (1 mg total) by mouth 2 (two) times daily.   atorvastatin (LIPITOR) 40 MG tablet Take 1 tablet (40 mg total) by mouth daily.   Blood Glucose Monitoring Suppl (ACCU-CHEK NANO SMARTVIEW) w/Device KIT CHECK BLOOD SUGER UP TO 3 TIMES A DAY   clopidogrel (PLAVIX) 75 MG tablet Take 1 tablet (75 mg total) by mouth daily.   Continuous Blood Gluc Receiver (FREESTYLE LIBRE 2 READER) DEVI Use to test blood sugar continuously. DX: E11.65   Continuous Glucose Sensor (FREESTYLE LIBRE 2 SENSOR) MISC CHANGE SENSOR ON BACK OF ARM EVERY 14 DAYS AS DIRECTED TO CHECK BLOOD SUGAR CONTINUOUSLY   donepezil (ARICEPT) 10 MG tablet Take 1 tablet (10 mg total) by mouth at bedtime.   Elastic Bandages & Supports (V-2 HIGH COMPRESSION HOSE) MISC 1 each by Does not apply route daily.   escitalopram (  LEXAPRO) 20 MG tablet Take 1 tablet (20 mg total) by mouth daily.   fenofibrate 160 MG tablet Take 1 tablet (160 mg total) by mouth daily.   furosemide (LASIX) 40 MG tablet Take 1 tablet (40 mg total) by mouth daily.   glucose blood (ACCU-CHEK SMARTVIEW) test strip CHECK BLOOD SUGAR UP TO 3 TIMES A DAY AS DIRECTED   insulin aspart (NOVOLOG) 100 UNIT/ML injection Inject 0-10 Units into the skin See admin instructions. 0-10 units per sliding scale, three times daily before meals.   insulin glargine (LANTUS) 100 UNIT/ML injection Inject 0.4 mLs (40 Units total)  into the skin at bedtime.   Insulin Pen Needle (SURE COMFORT PEN NEEDLES) 31G X 8 MM MISC USE TO inject lantus ONCE daily   lisinopril (ZESTRIL) 10 MG tablet Take 1 tablet (10 mg total) by mouth daily.   memantine (NAMENDA) 10 MG tablet Take 1 tablet (10 mg total) by mouth 2 (two) times daily.   Menthol-Zinc Oxide (CALMOSEPTINE) 0.44-20.6 % OINT Apply heavily to affected area   metFORMIN (GLUCOPHAGE-XR) 750 MG 24 hr tablet TAKE ONE TABLET BY MOUTH TWICE DAILY WITH MEAL(S)   metoprolol succinate (TOPROL-XL) 25 MG 24 hr tablet Take 1 tablet (25 mg total) by mouth daily.   Multiple Vitamins-Minerals (ONE-A-DAY WOMENS 50+) TABS Take 1 tablet by mouth daily.   nystatin cream (MYCOSTATIN) Apply 1 Application topically 2 (two) times daily.   polyethylene glycol powder (GLYCOLAX/MIRALAX) 17 GM/SCOOP powder Take 17 g by mouth daily.   spironolactone (ALDACTONE) 25 MG tablet Take 0.5 tablets (12.5 mg total) by mouth daily.   sulfamethoxazole-trimethoprim (BACTRIM DS) 800-160 MG tablet Take 1 tablet by mouth 2 (two) times daily.   traZODone (DESYREL) 50 MG tablet Take 1 tablet (50 mg total) by mouth at bedtime.   No facility-administered encounter medications on file as of 09/05/2023.    Past Surgical History:  Procedure Laterality Date   CAROTID ENDARTERECTOMY Right    CAROTID STENT     LEFT HEART CATH AND CORONARY ANGIOGRAPHY N/A 04/07/2023   Procedure: LEFT HEART CATH AND CORONARY ANGIOGRAPHY;  Surgeon: Tonny Bollman, MD;  Location: [facility] INVASIVE CV LAB;  Service: Cardiovascular;  Laterality: N/A;    Family History  Problem Relation Age of Onset   Heart disease Father    Heart attack Father    Hypertension Father    Diabetes Sister    Stroke Sister    Diabetes Brother    Diabetes Sister    Diabetes Sister    Diabetes Brother    Diabetes Brother    Diabetes Brother       Controlled substance contract: ***     Review of Systems     Objective:   Physical Exam         Assessment & Plan:

## 2023-10-14 ENCOUNTER — Ambulatory Visit: Payer: Medicare Other | Admitting: Nurse Practitioner

## 2023-10-21 ENCOUNTER — Ambulatory Visit (INDEPENDENT_AMBULATORY_CARE_PROVIDER_SITE_OTHER): Payer: Medicare Other | Admitting: Nurse Practitioner

## 2023-10-21 ENCOUNTER — Ambulatory Visit: Payer: Medicare Other | Admitting: Nurse Practitioner

## 2023-10-21 VITALS — BP 182/90 | HR 77 | Temp 98.2°F

## 2023-10-21 DIAGNOSIS — I10 Essential (primary) hypertension: Secondary | ICD-10-CM

## 2023-10-21 DIAGNOSIS — I25110 Atherosclerotic heart disease of native coronary artery with unstable angina pectoris: Secondary | ICD-10-CM

## 2023-10-21 DIAGNOSIS — F0390 Unspecified dementia without behavioral disturbance: Secondary | ICD-10-CM

## 2023-10-21 DIAGNOSIS — Z8673 Personal history of transient ischemic attack (TIA), and cerebral infarction without residual deficits: Secondary | ICD-10-CM | POA: Diagnosis not present

## 2023-10-21 DIAGNOSIS — Z794 Long term (current) use of insulin: Secondary | ICD-10-CM

## 2023-10-21 DIAGNOSIS — I700 Atherosclerosis of aorta: Secondary | ICD-10-CM | POA: Diagnosis not present

## 2023-10-21 DIAGNOSIS — Z6831 Body mass index (BMI) 31.0-31.9, adult: Secondary | ICD-10-CM | POA: Diagnosis not present

## 2023-10-21 DIAGNOSIS — F411 Generalized anxiety disorder: Secondary | ICD-10-CM

## 2023-10-21 DIAGNOSIS — R234 Changes in skin texture: Secondary | ICD-10-CM

## 2023-10-21 DIAGNOSIS — E1169 Type 2 diabetes mellitus with other specified complication: Secondary | ICD-10-CM

## 2023-10-21 DIAGNOSIS — E1142 Type 2 diabetes mellitus with diabetic polyneuropathy: Secondary | ICD-10-CM | POA: Diagnosis not present

## 2023-10-21 DIAGNOSIS — F5101 Primary insomnia: Secondary | ICD-10-CM

## 2023-10-21 DIAGNOSIS — I6523 Occlusion and stenosis of bilateral carotid arteries: Secondary | ICD-10-CM | POA: Diagnosis not present

## 2023-10-21 DIAGNOSIS — R600 Localized edema: Secondary | ICD-10-CM

## 2023-10-21 LAB — BAYER DCA HB A1C WAIVED: HB A1C (BAYER DCA - WAIVED): 8.8 % — ABNORMAL HIGH (ref 4.8–5.6)

## 2023-10-21 MED ORDER — TRAZODONE HCL 50 MG PO TABS: 50.0000 mg | ORAL_TABLET | Freq: Every day | ORAL | 1 refills | Status: DC

## 2023-10-21 MED ORDER — FENOFIBRATE 160 MG PO TABS: 160.0000 mg | ORAL_TABLET | Freq: Every day | ORAL | 1 refills | Status: DC

## 2023-10-21 MED ORDER — METFORMIN HCL ER 750 MG PO TB24: 750.0000 mg | ORAL_TABLET | Freq: Every day | ORAL | 1 refills | Status: DC

## 2023-10-21 MED ORDER — METOPROLOL SUCCINATE ER 25 MG PO TB24: 25.0000 mg | ORAL_TABLET | Freq: Every day | ORAL | 3 refills | Status: DC

## 2023-10-21 MED ORDER — FUROSEMIDE 40 MG PO TABS: 40.0000 mg | ORAL_TABLET | Freq: Every day | ORAL | 1 refills | Status: DC

## 2023-10-21 MED ORDER — DONEPEZIL HCL 10 MG PO TABS: 10.0000 mg | ORAL_TABLET | Freq: Every day | ORAL | 1 refills | Status: DC

## 2023-10-21 MED ORDER — ALPRAZOLAM 1 MG PO TABS: 1.0000 mg | ORAL_TABLET | Freq: Two times a day (BID) | ORAL | 5 refills | Status: DC

## 2023-10-21 MED ORDER — MEMANTINE HCL 10 MG PO TABS: 10.0000 mg | ORAL_TABLET | Freq: Two times a day (BID) | ORAL | 1 refills | Status: DC

## 2023-10-21 MED ORDER — ESCITALOPRAM OXALATE 20 MG PO TABS: 20.0000 mg | ORAL_TABLET | Freq: Every day | ORAL | 1 refills | Status: DC

## 2023-10-21 MED ORDER — LISINOPRIL 10 MG PO TABS: 10.0000 mg | ORAL_TABLET | Freq: Every day | ORAL | 1 refills | Status: DC

## 2023-10-21 NOTE — Progress Notes (Signed)
Subjective:    Patient ID: Kylie Ward, female    DOB: 1945-12-25, 77 y.o.   MRN: [id number]   Chief Complaint: medical management of chronic issues     HPI:  Kylie Ward is a 77 y.o. who identifies as a female who was assigned female at birth.   Social history: Lives with: husband who is her caregiver Work history: retired   Water engineer in today for follow up of the following chronic medical issues:  1. Essential hypertension, benign No c/o chest pain, sob or headache. Does not check blood pressure at home. BP Readings from Last 3 Encounters:  09/05/23 (!) 88/63  08/25/23 129/72  08/22/23 (!) 140/59     2. Coronary artery disease involving native coronary artery of native heart with unstable angina pectoris [facility]) Patient had left heart cath on 04/07/23. Had follow up with cardiology on 04/21/23. Review of office noted showed no change in plan of care.  3. Type 2 diabetes mellitus with diabetic polyneuropathy, with long-term current use of insulin (HCC) Fasting blood sugars are running around 120-150. Denis any low blood sugars. BACK ON ALL MEDS INCLUDING SLIDING SCALE. Lab Results  Component Value Date   HGBA1C 9.9 (H) 08/05/2023     4. Hyperlipidemia associated with type 2 diabetes mellitus (HCC) Eats whatever her husband fixes her to eat. Does no exercise at all. Lab Results  Component Value Date   CHOL 143 08/05/2023   HDL 38 (L) 08/05/2023   LDLCALC 41 08/05/2023   TRIG 444 (H) 08/05/2023   CHOLHDL 3.8 08/05/2023     5. Peripheral edema Has occasional edema. Has recently had cellulitis and had to have unna boots. She currently has edema and redness again on right leg. Cardiologist added aldactone to meds to help with edema.  6. Dementia without behavioral disturbance [facility]) Husband says she is slowly getting worse. He is no longer able to leave her home by herself for extended periods of time.   7. GAD (generalized anxiety disorder) Stays anxious.  Her husband gives her xanax BID    09/05/2023    3:40 PM 08/25/2023    9:27 AM 08/05/2023    2:05 PM 07/11/2023    2:01 PM  GAD 7 : Generalized Anxiety Score  Nervous, Anxious, on Edge 2 2 2 2   Control/stop worrying 2 1 2 1   Worry too much - different things 2 2 3 1   Trouble relaxing 2 1 1 1   Restless 1 1 1 2   Easily annoyed or irritable 2 2 2 2   Afraid - awful might happen 1 1 1  0  Total GAD 7 Score 12 10 12 9   Anxiety Difficulty Somewhat difficult Somewhat difficult Somewhat difficult Not difficult at all      8. Primary insomnia Takes trazadone to sleep and sleeps well.   New complaints: Cellulitis has finally resolved  Allergies  Allergen Reactions   Crestor [Rosuvastatin] Other (See Comments)    Myalgias and arthralgias to the point she had difficulty walking. She was tolerant of lipitor from the family's recall of events.    Niaspan [Niacin Er (Antihyperlipidemic)] Itching    Skin burning   Outpatient Encounter Medications as of 10/21/2023  Medication Sig   Accu-Chek FastClix Lancets MISC CHECK BLOOD SUGAR UP TO 3 TIMES A DAY AS DIRECTED Dx E11.42   ALPRAZolam (XANAX) 1 MG tablet Take 1 tablet (1 mg total) by mouth 2 (two) times daily.   atorvastatin (LIPITOR) 40 MG tablet Take 1  tablet (40 mg total) by mouth daily.   Blood Glucose Monitoring Suppl (ACCU-CHEK NANO SMARTVIEW) w/Device KIT CHECK BLOOD SUGER UP TO 3 TIMES A DAY   clopidogrel (PLAVIX) 75 MG tablet Take 1 tablet (75 mg total) by mouth daily.   Continuous Blood Gluc Receiver (FREESTYLE LIBRE 2 READER) DEVI Use to test blood sugar continuously. DX: E11.65   Continuous Glucose Sensor (FREESTYLE LIBRE 2 SENSOR) MISC CHANGE SENSOR ON BACK OF ARM EVERY 14 DAYS AS DIRECTED TO CHECK BLOOD SUGAR CONTINUOUSLY   donepezil (ARICEPT) 10 MG tablet Take 1 tablet (10 mg total) by mouth at bedtime.   Elastic Bandages & Supports (V-2 HIGH COMPRESSION HOSE) MISC 1 each by Does not apply route daily.   escitalopram (LEXAPRO)  20 MG tablet Take 1 tablet (20 mg total) by mouth daily.   fenofibrate 160 MG tablet Take 1 tablet (160 mg total) by mouth daily.   furosemide (LASIX) 40 MG tablet Take 1 tablet (40 mg total) by mouth daily.   glucose blood (ACCU-CHEK SMARTVIEW) test strip CHECK BLOOD SUGAR UP TO 3 TIMES A DAY AS DIRECTED   insulin aspart (NOVOLOG) 100 UNIT/ML injection Inject 0-10 Units into the skin See admin instructions. 0-10 units per sliding scale, three times daily before meals.   insulin glargine (LANTUS) 100 UNIT/ML injection Inject 0.4 mLs (40 Units total) into the skin at bedtime.   Insulin Pen Needle (SURE COMFORT PEN NEEDLES) 31G X 8 MM MISC USE TO inject lantus ONCE daily   lisinopril (ZESTRIL) 10 MG tablet Take 1 tablet (10 mg total) by mouth daily.   memantine (NAMENDA) 10 MG tablet Take 1 tablet (10 mg total) by mouth 2 (two) times daily.   Menthol-Zinc Oxide (CALMOSEPTINE) 0.44-20.6 % OINT Apply heavily to affected area   metFORMIN (GLUCOPHAGE-XR) 750 MG 24 hr tablet TAKE ONE TABLET BY MOUTH TWICE DAILY WITH MEAL(S)   metoprolol succinate (TOPROL-XL) 25 MG 24 hr tablet Take 1 tablet (25 mg total) by mouth daily.   Multiple Vitamins-Minerals (ONE-A-DAY WOMENS 50+) TABS Take 1 tablet by mouth daily.   nystatin cream (MYCOSTATIN) Apply 1 Application topically 2 (two) times daily.   polyethylene glycol powder (GLYCOLAX/MIRALAX) 17 GM/SCOOP powder Take 17 g by mouth daily.   spironolactone (ALDACTONE) 25 MG tablet Take 0.5 tablets (12.5 mg total) by mouth daily.   traZODone (DESYREL) 50 MG tablet Take 1 tablet (50 mg total) by mouth at bedtime.   No facility-administered encounter medications on file as of 10/21/2023.    Past Surgical History:  Procedure Laterality Date   CAROTID ENDARTERECTOMY Right    CAROTID STENT     LEFT HEART CATH AND CORONARY ANGIOGRAPHY N/A 04/07/2023   Procedure: LEFT HEART CATH AND CORONARY ANGIOGRAPHY;  Surgeon: Tonny Bollman, MD;  Location: [facility] INVASIVE CV LAB;   Service: Cardiovascular;  Laterality: N/A;    Family History  Problem Relation Age of Onset   Heart disease Father    Heart attack Father    Hypertension Father    Diabetes Sister    Stroke Sister    Diabetes Brother    Diabetes Sister    Diabetes Sister    Diabetes Brother    Diabetes Brother    Diabetes Brother       Controlled substance contract: 08/05/23     Review of Systems  Constitutional:  Negative for diaphoresis.  Eyes:  Negative for pain.  Respiratory:  Negative for shortness of breath.   Cardiovascular:  Positive for leg swelling. Negative  for chest pain and palpitations.  Gastrointestinal:  Negative for abdominal pain.  Endocrine: Negative for polydipsia.  Skin:  Negative for rash.       Sore on buttocks  Neurological:  Negative for dizziness, weakness and headaches.  Hematological:  Does not bruise/bleed easily.  All other systems reviewed and are negative.      Objective:   Physical Exam Constitutional:      Appearance: Normal appearance.  Cardiovascular:     Rate and Rhythm: Normal rate and regular rhythm.     Heart sounds: Normal heart sounds.  Pulmonary:     Effort: Pulmonary effort is normal.     Breath sounds: Normal breath sounds.  Musculoskeletal:     Right lower leg: Edema (2+) present.     Left lower leg: Edema (1+) present.  Skin:    General: Skin is warm.     Comments: Scaley skin  on bil lower ext  Neurological:     General: No focal deficit present.     Mental Status: She is alert and oriented to person, place, and time.  Psychiatric:        Mood and Affect: Mood normal.        Behavior: Behavior normal.     LMP 01/27/1983   Hgba1c 9.9%      Assessment & Plan:   Kylie Ward comes in today with chief complaint of No chief complaint on file.   Diagnosis and orders addressed:  1. Essential hypertension, benign Low sodium diet - CBC with Differential/Platelet  2. Coronary artery disease involving native  coronary artery of native heart with unstable angina pectoris [facility]) Keep follow up with cardiology  3. Type 2 diabetes mellitus with diabetic polyneuropathy, with long-term current use of insulin (HCC) Continue to watch carbs in diet Increase lantus to 40u daily - Bayer DCA Hb A1c Waived  4. Hyperlipidemia associated with type 2 diabetes mellitus (HCC) Low fat diet - Lipid panel  5. Peripheral edema Elevate legs when sitting Bactrim as ordered DO NOT REMOVE UNNA BOOTS - Apply unna boot  6. Dementia without behavioral disturbance (HCC) Orient daily  7. GAD (generalized anxiety disorder) - Drug Screen 10 W/Conf, Se  8. Primary insomnia Bedtime routine - traZODone (DESYREL) 50 MG tablet; Take 1 tablet (50 mg total) by mouth at bedtime.  Dispense: 90 tablet; Refill: 1  9. Scaly skin Soak bil lower legs and feet in epsom salt bid Apply lotion  Meds ordered this encounter  Medications   donepezil (ARICEPT) 10 MG tablet    Sig: Take 1 tablet (10 mg total) by mouth at bedtime.    Dispense:  90 tablet    Refill:  1    Supervising Provider:   Arville Care A [[id number]]   ALPRAZolam (XANAX) 1 MG tablet    Sig: Take 1 tablet (1 mg total) by mouth 2 (two) times daily.    Dispense:  60 tablet    Refill:  5    FOR FILL ON 07/22/22    Supervising Provider:   Arville Care A [[id number]]   escitalopram (LEXAPRO) 20 MG tablet    Sig: Take 1 tablet (20 mg total) by mouth daily.    Dispense:  90 tablet    Refill:  1    Supervising Provider:   Arville Care A [[id number]]   fenofibrate 160 MG tablet    Sig: Take 1 tablet (160 mg total) by mouth daily.    Dispense:  90 tablet  Refill:  1    This prescription was filled on 06/16/2017. Any refills authorized will be placed on file.    Supervising Provider:   Arville Care A [[id number]]   furosemide (LASIX) 40 MG tablet    Sig: Take 1 tablet (40 mg total) by mouth daily.    Dispense:  90 tablet    Refill:  1    To replace  lasxi 20mg  dose previously sent in Thank You    Supervising Provider:   Arville Care A [[id number]]   traZODone (DESYREL) 50 MG tablet    Sig: Take 1 tablet (50 mg total) by mouth at bedtime.    Dispense:  90 tablet    Refill:  1    Supervising Provider:   Arville Care A [[id number]]   metFORMIN (GLUCOPHAGE-XR) 750 MG 24 hr tablet    Sig: Take 1 tablet (750 mg total) by mouth daily with breakfast.    Dispense:  180 tablet    Refill:  1    Supervising Provider:   Arville Care A [[id number]]   lisinopril (ZESTRIL) 10 MG tablet    Sig: Take 1 tablet (10 mg total) by mouth daily.    Dispense:  90 tablet    Refill:  1    Supervising Provider:   Arville Care A [[id number]]   memantine (NAMENDA) 10 MG tablet    Sig: Take 1 tablet (10 mg total) by mouth 2 (two) times daily.    Dispense:  180 tablet    Refill:  1    Supervising Provider:   Arville Care A [[id number]]   metoprolol succinate (TOPROL-XL) 25 MG 24 hr tablet    Sig: Take 1 tablet (25 mg total) by mouth daily.    Dispense:  90 tablet    Refill:  3    Supervising Provider:   Arville Care A [id number]    Labs pending Health Maintenance reviewed Diet and exercise encouraged  Follow up plan: 3 months   [name], FNP

## 2023-10-24 ENCOUNTER — Encounter: Payer: Self-pay | Admitting: Nurse Practitioner

## 2023-10-28 ENCOUNTER — Ambulatory Visit: Payer: Medicare Other

## 2023-11-03 ENCOUNTER — Telehealth: Payer: Self-pay | Admitting: Nurse Practitioner

## 2023-11-03 NOTE — Progress Notes (Signed)
 Cardiology Office Note    Patient Name: Kylie Ward Date of Encounter: 11/03/2023  Primary Care Provider:  Gladis Mustard, FNP Primary Cardiologist:  None Primary Electrophysiologist: None   Past Medical History    Past Medical History:  Diagnosis Date   Anxiety    Diabetes mellitus without complication (HCC)    Hyperlipidemia    Hypertension    Stroke [facility])     History of Present Illness  Kylie Ward is a 78 y.o. female with a PMH of CAD s/p NSTEMI 04/2023 with LHC revealing three-vessel disease (not a candidate for CABG due to comorbidities), HFrEF, dementia, carotid artery stenosis s/p left ICA stenting in 2011, right carotid endarterectomy in 2015 who presents today for 77-month follow-up.  Kylie Ward was originally seen with complaint of chest pain on 04/07/2023 in the setting of NSTEMI.  She was transported to the ED via EMS and troponins revealed elevation of 339 trending upward to 786.  Pulmonary edema and bilateral pleural effusions with BNP of 870.  2D echo was completed showing decreased EF of 25-30% RWMA and grade 1 DD.  EKG showed sinus tachycardia with LBBB and patient had significant improvement with IV heparin  drip.  She was admitted and underwent LHC that showed severe three-vessel disease with total occlusion of mid RCA and left-to-right collaterals and severe mid LAD stenosis of 80% 80 and 90% ostial and mid circumflex stenosis.  Due to patient's comorbidities she was found not to be a candidate for CABG and was treated medically. She was diuresed with Lasix  and discharged in stable condition. She was referred to Dr. Sheree due to subclavian stenosis in addition to repeat carotid studies in 2 years. She was seen by Orren Fabry, PA no chest pain or shortness of breath.  2D echo was completed on 08/09/2023 with improvement of EF at 40-45% and mild to moderate symmetrical ventricular hypertrophy She was seen by Dr. Wonda on 08/04/2023 new cardiac problems at that  time.  He is being treated for cellulitis and had some leg edema present.  She was continued on Lasix  40 mg daily and spironolactone  12.5 was added with advisement to stay away from SGLT2 due to risk of GU infection.  He had some hypotension and lisinopril  was held with improvement at previous visit.  Due to soft BP GDMT titration is limited.  Kylie Ward  presents for a routine follow-up. The patient's husband is also present during the visit. The patient reports no new symptoms or side effects from the new medication, spironolactone , which was recently added to her regimen for blood pressure control and fluid management. The patient's blood pressure is slightly elevated, but she reports that her home readings are usually around 125/130 over 68/70. She was restarted on lisinopril  10 mg and is tolerating it well.  She has cellulitis in her right leg that is improving with treatment.  Very mindful of her salt intake and is weighing herself daily.  She is also active participates in daily activities with her husband.  Patient denies chest pain, palpitations, dyspnea, PND, orthopnea, nausea, vomiting, dizziness, syncope, edema, weight gain, or early satiety.  Review of Systems  Please see the history of present illness.    All other systems reviewed and are otherwise negative except as noted above.  Physical Exam    Wt Readings from Last 3 Encounters:  08/22/23 164 lb 0.4 oz (74.4 kg)  08/05/23 164 lb (74.4 kg)  08/04/23 164 lb 3.2 oz (74.5 kg)  CD:Uyzmz were no vitals filed for this visit.,There is no height or weight on file to calculate BMI. GEN: Well nourished, well developed in no acute distress Neck: No JVD; No carotid bruits Pulmonary: Clear to auscultation without rales, wheezing or rhonchi  Cardiovascular: Normal rate. Regular rhythm. Normal S1. Normal S2.   Murmurs: There is no murmur.  ABDOMEN: Soft, non-tender, non-distended EXTREMITIES:  No edema; No deformity   EKG/LABS/ Recent  Cardiac Studies   ECG personally reviewed by me today -none completed today  Risk Assessment/Calculations:          Lab Results  Component Value Date   WBC 7.7 08/22/2023   HGB 11.7 (L) 08/22/2023   HCT 36.8 08/22/2023   MCV 93.4 08/22/2023   PLT 242 08/22/2023   Lab Results  Component Value Date   CREATININE 1.68 (H) 08/25/2023   BUN 41 (H) 08/25/2023   NA 137 08/25/2023   K 5.1 08/25/2023   CL 101 08/25/2023   CO2 21 08/25/2023   Lab Results  Component Value Date   CHOL 143 08/05/2023   HDL 38 (L) 08/05/2023   LDLCALC 41 08/05/2023   TRIG 444 (H) 08/05/2023   CHOLHDL 3.8 08/05/2023    Lab Results  Component Value Date   HGBA1C 8.8 (H) 10/21/2023   Assessment & Plan    1.  Coronary artery disease: -s/p NSTEMI 04/2023 with LHC revealing three-vessel disease (not a candidate for CABG due to comorbidity -Today patient reports no chest pain or angina since previous follow-up. -Continue current GDMT with Lipitor 40 mg daily, Plavix  75 mg daily, fenofibrate  160 mg, Toprol -XL 25 mg daily, spironolactone  12.5 mg daily  2.  HFrEF: -2D echo completed on  08/2023 with EF at 40-45% and mild to moderate symmetrical ventricular hypertrophy, mild to moderate MR with mild to moderately dilated LA. -Stable on current regimen of Metoprolol  and Spironolactone .   -Plan to increase Spironolactone  to 25mg  daily, pending lab results. -Check kidney function and potassium levels today before increasing Spironolactone . -Repeat echocardiogram in 6 months to monitor heart function.  3.  History of carotid stenosis: -Currently followed by  [facility] with plan for repeat carotid U/S in two years  4.  DM type II: -Patient's hemoglobin A1c was 8.8% and is currently managed by PCP -Continue current treatment plan per PCP  5.  Essential hypertension: -Patient blood pressure today was stable at 140/58 -Continue lisinopril  10 mg daily  Disposition: Follow-up with None or APP in 3 months    Signed, Wyn Raddle, Jackee Shove, NP 11/03/2023, 7:19 AM [facility]

## 2023-11-04 ENCOUNTER — Ambulatory Visit: Payer: Medicare Other | Attending: Nurse Practitioner | Admitting: Nurse Practitioner

## 2023-11-04 ENCOUNTER — Encounter: Payer: Self-pay | Admitting: Nurse Practitioner

## 2023-11-04 VITALS — BP 140/58 | HR 61 | Ht 63.0 in | Wt 166.7 lb

## 2023-11-04 DIAGNOSIS — I5020 Unspecified systolic (congestive) heart failure: Secondary | ICD-10-CM | POA: Diagnosis not present

## 2023-11-04 DIAGNOSIS — I739 Peripheral vascular disease, unspecified: Secondary | ICD-10-CM

## 2023-11-04 DIAGNOSIS — I2510 Atherosclerotic heart disease of native coronary artery without angina pectoris: Secondary | ICD-10-CM

## 2023-11-04 DIAGNOSIS — Z794 Long term (current) use of insulin: Secondary | ICD-10-CM | POA: Diagnosis not present

## 2023-11-04 DIAGNOSIS — I6529 Occlusion and stenosis of unspecified carotid artery: Secondary | ICD-10-CM

## 2023-11-04 DIAGNOSIS — E1142 Type 2 diabetes mellitus with diabetic polyneuropathy: Secondary | ICD-10-CM

## 2023-11-04 DIAGNOSIS — I10 Essential (primary) hypertension: Secondary | ICD-10-CM | POA: Diagnosis not present

## 2023-11-04 DIAGNOSIS — Z79899 Other long term (current) drug therapy: Secondary | ICD-10-CM

## 2023-11-04 MED ORDER — SPIRONOLACTONE 25 MG PO TABS: 25.0000 mg | ORAL_TABLET | Freq: Every day | ORAL | 3 refills | Status: DC

## 2023-11-04 NOTE — Patient Instructions (Signed)
 Medication Instructions:  Please INCREASE your spironolactone  to 25 mg daily.   *If you need a refill on your cardiac medications before your next appointment, please call your pharmacy*   Lab Work: Please complete a BMET today at the [facility] on the first floor of our building.  Please complete another BMET in 2 weeks at any [facility].  If you have labs (blood work) drawn today and your tests are completely normal, you will receive your results only by: MyChart Message (if you have MyChart) OR A paper copy in the mail If you have any lab test that is abnormal or we need to change your treatment, we will call you to review the results.   Testing/Procedures: None.   Follow-Up: At [facility], you and your health needs are our priority.  As part of our continuing mission to provide you with exceptional heart care, we have created designated Provider Care Teams.  These Care Teams include your primary Cardiologist (physician) and Advanced Practice Providers (APPs -  Physician Assistants and Nurse Practitioners) who all work together to provide you with the care you need, when you need it.  We recommend signing up for the patient portal called MyChart.  Sign up information is provided on this After Visit Summary.  MyChart is used to connect with patients for Virtual Visits (Telemedicine).  Patients are able to view lab/test results, encounter notes, upcoming appointments, etc.  Non-urgent messages can be sent to your provider as well.   To learn more about what you can do with MyChart, go to [email or web address].    Your next appointment:   3 month(s)  Provider:   Dr. Wonda, Orren Fabry, PA-C or Jackee Alberts, NP

## 2023-11-05 LAB — BASIC METABOLIC PANEL
BUN/Creatinine Ratio: 17 (ref 12–28)
BUN: 27 mg/dL (ref 8–27)
CO2: 18 mmol/L — ABNORMAL LOW (ref 20–29)
Calcium: 9.3 mg/dL (ref 8.7–10.3)
Chloride: 101 mmol/L (ref 96–106)
Creatinine, Ser: 1.57 mg/dL — ABNORMAL HIGH (ref 0.57–1.00)
Glucose: 275 mg/dL — ABNORMAL HIGH (ref 70–99)
Potassium: 4.7 mmol/L (ref 3.5–5.2)
Sodium: 138 mmol/L (ref 134–144)
eGFR: 34 mL/min/{1.73_m2} — ABNORMAL LOW (ref >59)

## 2023-11-07 ENCOUNTER — Other Ambulatory Visit: Payer: Self-pay

## 2023-11-07 MED ORDER — LISINOPRIL 20 MG PO TABS: 20.0000 mg | ORAL_TABLET | Freq: Every day | ORAL | 3 refills | Status: DC

## 2023-11-10 NOTE — Telephone Encounter (Signed)
 Copied from CRM [id number]. Topic: Clinical - Medical Advice >> Nov 10, 2023 10:34 AM Kylie Ward wrote: Reason for CRM: Woke up this morning and doesn't know where she was she didn't recognize anyone. No new meds, daughter wants to know what's going on with confusion

## 2023-11-10 NOTE — Telephone Encounter (Signed)
Needs to go to ED

## 2023-12-16 ENCOUNTER — Other Ambulatory Visit: Payer: Self-pay | Admitting: Nurse Practitioner

## 2023-12-16 DIAGNOSIS — E1142 Type 2 diabetes mellitus with diabetic polyneuropathy: Secondary | ICD-10-CM

## 2023-12-31 NOTE — Progress Notes (Signed)
 ADVANCED HEART FAILURE NEW PATIENT CLINIC NOTE  Referring Physician: Bennie Pierini, *  Primary Care: Bennie Pierini, FNP Primary Cardiologist:  HPI: Kylie HERNAN is a 78 y.o. female with a PMH of CAD s/p NSTEMI 04/2023 with LHC revealing three-vessel disease (not a candidate for CABG due to comorbidities), HFrEF, dementia, carotid artery stenosis s/p left ICA stenting in 2011, right carotid endarterectomy in 2015 who presents for initial visit for further evaluation and treatment of heart failure/cardiomyopathy.      Originally seen 04/2023 for NSTEMI with severely reduced EF. LHC showed three vessel disease but patient was not a candidate for CABG given multiple comorbid conditions. Her EF improved to 40-45% on limited medical therapy but was not able to undergo significant titration of medical therapy given low blood pressures. She was reportedly doing well, with minimal lower extremity edema treated with lasix.      SUBJECTIVE:  Patient with known subclavian stenosis, feels well. She has swelling of her lower extremities, worse on the right side, but according to family it is better than it has been in the past.  She has some memory issues and is not very mobile at home, but gets around with assistance.  She denies any worsening orthopnea, shortness of breath, PND.  She has not had a history of UTIs in the past.  She does report pain and cramping in her legs with exertion.  PMH, current medications, allergies, social history, and family history reviewed in epic.  PHYSICAL EXAM: Vitals:   01/02/24 1502 01/02/24 1509  BP: (!) 78/58 (!) 135/58  Pulse: 60   SpO2: 94%    GENERAL: Well nourished and in no apparent distress at rest.  PULM:  Normal work of breathing, clear to auscultation bilaterally. Respirations are unlabored.  CARDIAC:  JVP: Flat         Normal rate with regular rhythm. No murmurs, rubs or gallops.  1+ right lower extremity edema, trace edema on the  left.  Evidence of PAD ABDOMEN: Soft, non-tender, non-distended. NEUROLOGIC: Patient is oriented x3 with no focal or lateralizing neurologic deficits.    DATA REVIEW  ECG: 04/11/23: Sinus tachycardia, LBBB    ECHO: 04/2023: LVEF 25-30%, severely depressed function, normal RV function, MAC 08/2023: LVEF 40-45%, normal RV, severe MAC with mild mitral stenosis  CATH: 04/2023: Severe proximal and distal left main disease, occluded RCA, severe mid LAD disease, elevated LVEDP of 29    Heart failure review: - Classification: Heart failure with reduced EF - Etiology: Ischemic - NYHA Class: II - Volume status: Euvolemic   ASSESSMENT & PLAN:  Chronic heart failure with mildly reduced EF: Ischemic in origin, improved to 40-45% on medical therapy. If worsening down the line does have known LBBB so could consider CRT-P.  Mild volume overload and CKD as below so we will trial patient on SGLT2. -Start Farxiga 10 mg daily -Continue lisinopril 20 mg daily, metoprolol succinate 25 mg daily, spironolactone 25 mg daily -Repeat renal function labs at next visit -No need for repeat echocardiogram at this time, can obtain for worsening symptoms.  CAD: No severe disease, no targets for bypass surgery given lack of angina no benefit to PCI. -Continue Plavix 75 mg daily, statin  Peripheral arterial disease: Known carotid disease s/p left ICA stenting, R CEA.  Location pain in her lower extremities, almost certainly has vascular disease based on exam.  Will obtain lower extremity Dopplers for further evaluation -Continue atorvastatin 40 mg, fenofibrate 160 mg, Plavix 75 mg  CKD: Stage IIIa. - SGLT2 as above  Follow up in 3 months  Clearnce Hasten, MD Advanced Heart Failure Mechanical Circulatory Support 01/04/24

## 2024-01-02 ENCOUNTER — Ambulatory Visit (HOSPITAL_COMMUNITY)
Admission: RE | Admit: 2024-01-02 | Discharge: 2024-01-02 | Disposition: A | Discharge status: Home / Self Care | Payer: Medicare Other | Source: Ambulatory Visit | Attending: Cardiology | Admitting: Cardiology

## 2024-01-02 ENCOUNTER — Other Ambulatory Visit (HOSPITAL_COMMUNITY): Payer: Self-pay

## 2024-01-02 VITALS — BP 135/58 | HR 60 | Wt 159.4 lb

## 2024-01-02 DIAGNOSIS — I739 Peripheral vascular disease, unspecified: Secondary | ICD-10-CM | POA: Diagnosis not present

## 2024-01-02 DIAGNOSIS — I2510 Atherosclerotic heart disease of native coronary artery without angina pectoris: Secondary | ICD-10-CM | POA: Diagnosis not present

## 2024-01-02 DIAGNOSIS — Z79899 Other long term (current) drug therapy: Secondary | ICD-10-CM | POA: Diagnosis not present

## 2024-01-02 DIAGNOSIS — E1142 Type 2 diabetes mellitus with diabetic polyneuropathy: Secondary | ICD-10-CM | POA: Diagnosis not present

## 2024-01-02 DIAGNOSIS — I5022 Chronic systolic (congestive) heart failure: Secondary | ICD-10-CM | POA: Insufficient documentation

## 2024-01-02 DIAGNOSIS — R600 Localized edema: Secondary | ICD-10-CM | POA: Diagnosis not present

## 2024-01-02 DIAGNOSIS — Z7902 Long term (current) use of antithrombotics/antiplatelets: Secondary | ICD-10-CM | POA: Insufficient documentation

## 2024-01-02 DIAGNOSIS — I447 Left bundle-branch block, unspecified: Secondary | ICD-10-CM | POA: Insufficient documentation

## 2024-01-02 DIAGNOSIS — Z955 Presence of coronary angioplasty implant and graft: Secondary | ICD-10-CM | POA: Insufficient documentation

## 2024-01-02 DIAGNOSIS — N1831 Chronic kidney disease, stage 3a: Secondary | ICD-10-CM | POA: Insufficient documentation

## 2024-01-02 DIAGNOSIS — Z794 Long term (current) use of insulin: Secondary | ICD-10-CM | POA: Diagnosis not present

## 2024-01-02 DIAGNOSIS — F0390 Unspecified dementia without behavioral disturbance: Secondary | ICD-10-CM | POA: Diagnosis not present

## 2024-01-02 DIAGNOSIS — I252 Old myocardial infarction: Secondary | ICD-10-CM | POA: Insufficient documentation

## 2024-01-02 DIAGNOSIS — I429 Cardiomyopathy, unspecified: Secondary | ICD-10-CM | POA: Diagnosis not present

## 2024-01-02 DIAGNOSIS — I5020 Unspecified systolic (congestive) heart failure: Secondary | ICD-10-CM

## 2024-01-02 NOTE — Patient Instructions (Addendum)
 Medication Changes:  START: FARXIGA 10MG  ONCE DAILY- we will call you about this   Testing/Procedures:  Your physician has requested that you have a lower extremity arterial exercise duplex. During this test, exercise and ultrasound are used to evaluate arterial blood flow in the legs. Allow one hour for this exam. There are no restrictions or special instructions. SOMEONE WILL CALL YOU TO GET THIS SCHEDULED FOR YOU   Follow-Up in: 3 MONTHS PLEASE CALL OUR OFFICE AROUND APRIL TO GET SCHEDULED FOR YOUR APPOINTMENT. PHONE NUMBER IS [phone] OPTION 2   At the [facility], you and your health needs are our priority. We have a designated team specialized in the treatment of Heart Failure. This Care Team includes your primary Heart Failure Specialized Cardiologist (physician), Advanced Practice Providers (APPs- Physician Assistants and Nurse Practitioners), and Pharmacist who all work together to provide you with the care you need, when you need it.   You may see any of the following providers on your designated Care Team at your next follow up:  Dr. Arvilla Meres Dr. Marca Ancona Dr. Dorthula Nettles Dr. Theresia Bough Tonye Becket, NP Robbie Lis, [place] [facility] [place], [place] Brynda Peon, NP [place] Lee, NP Karle Plumber, PharmD   Please be sure to bring in all your medications bottles to every appointment.   Need to Contact us:  If you have any questions or concerns before your next appointment please send [place] a message through [place] or call our office at [phone].    TO LEAVE A MESSAGE FOR THE NURSE SELECT OPTION 2, PLEASE LEAVE A MESSAGE INCLUDING: YOUR NAME DATE OF BIRTH CALL BACK NUMBER REASON FOR CALL**this is important as we prioritize the call backs  YOU WILL RECEIVE A CALL BACK THE SAME DAY AS LONG AS YOU CALL BEFORE 4:00 PM

## 2024-01-03 ENCOUNTER — Telehealth (HOSPITAL_COMMUNITY): Payer: Self-pay

## 2024-01-03 ENCOUNTER — Other Ambulatory Visit (HOSPITAL_COMMUNITY): Payer: Self-pay

## 2024-01-03 MED ORDER — DAPAGLIFLOZIN PROPANEDIOL 10 MG PO TABS: 10.0000 mg | ORAL_TABLET | Freq: Every day | ORAL | 3 refills | Status: DC

## 2024-01-03 NOTE — Addendum Note (Signed)
 Addended by: Bea Laura B on: 01/03/2024 02:02 PM   Modules accepted: Orders

## 2024-01-03 NOTE — Addendum Note (Signed)
 Addended by: Bea Laura B on: 01/03/2024 02:06 PM   Modules accepted: Orders

## 2024-01-03 NOTE — Telephone Encounter (Signed)
 [facility] Patient Advocate Encounter  The patient was approved for a [employer] grant that will help cover the cost of Farxiga, Spironolactone.  Total amount awarded, $10,000.  Effective: 12/04/2023 - 12/02/2024.  BIN [id number] PCN [id number] Group [id number] ID [id number]  Pharmacy provided with approval and processing information. Patient informed via phone.  Burnell Blanks, CPhT Rx Patient Advocate Phone: [phone]

## 2024-01-04 ENCOUNTER — Other Ambulatory Visit (HOSPITAL_COMMUNITY): Payer: Self-pay

## 2024-01-19 ENCOUNTER — Encounter: Payer: Self-pay | Admitting: Nurse Practitioner

## 2024-01-19 ENCOUNTER — Ambulatory Visit (INDEPENDENT_AMBULATORY_CARE_PROVIDER_SITE_OTHER): Payer: Medicare Other | Admitting: Nurse Practitioner

## 2024-01-19 DIAGNOSIS — E785 Hyperlipidemia, unspecified: Secondary | ICD-10-CM

## 2024-01-19 DIAGNOSIS — R600 Localized edema: Secondary | ICD-10-CM | POA: Diagnosis not present

## 2024-01-19 DIAGNOSIS — I25110 Atherosclerotic heart disease of native coronary artery with unstable angina pectoris: Secondary | ICD-10-CM | POA: Diagnosis not present

## 2024-01-19 DIAGNOSIS — F411 Generalized anxiety disorder: Secondary | ICD-10-CM

## 2024-01-19 DIAGNOSIS — Z794 Long term (current) use of insulin: Secondary | ICD-10-CM | POA: Diagnosis not present

## 2024-01-19 DIAGNOSIS — F0390 Unspecified dementia without behavioral disturbance: Secondary | ICD-10-CM | POA: Diagnosis not present

## 2024-01-19 DIAGNOSIS — E1142 Type 2 diabetes mellitus with diabetic polyneuropathy: Secondary | ICD-10-CM | POA: Diagnosis not present

## 2024-01-19 DIAGNOSIS — F5101 Primary insomnia: Secondary | ICD-10-CM | POA: Diagnosis not present

## 2024-01-19 DIAGNOSIS — E1169 Type 2 diabetes mellitus with other specified complication: Secondary | ICD-10-CM | POA: Diagnosis not present

## 2024-01-19 MED ORDER — INSULIN ASPART 100 UNIT/ML IJ SOLN: 0.0000 [IU] | INTRAMUSCULAR | 2 refills | Status: DC

## 2024-01-19 MED ORDER — FENOFIBRATE 160 MG PO TABS: 160.0000 mg | ORAL_TABLET | Freq: Every day | ORAL | 1 refills | Status: DC

## 2024-01-19 MED ORDER — ATORVASTATIN CALCIUM 40 MG PO TABS: 40.0000 mg | ORAL_TABLET | Freq: Every day | ORAL | 3 refills | Status: DC

## 2024-01-19 MED ORDER — FUROSEMIDE 40 MG PO TABS: 40.0000 mg | ORAL_TABLET | Freq: Every day | ORAL | 1 refills | Status: DC

## 2024-01-19 MED ORDER — TRAZODONE HCL 50 MG PO TABS: 50.0000 mg | ORAL_TABLET | Freq: Every day | ORAL | 1 refills | Status: AC

## 2024-01-19 MED ORDER — DONEPEZIL HCL 10 MG PO TABS: 10.0000 mg | ORAL_TABLET | Freq: Every day | ORAL | 1 refills | Status: AC

## 2024-01-19 MED ORDER — METFORMIN HCL ER 750 MG PO TB24: 750.0000 mg | ORAL_TABLET | Freq: Every day | ORAL | 1 refills | Status: DC

## 2024-01-19 MED ORDER — MEMANTINE HCL 10 MG PO TABS: 10.0000 mg | ORAL_TABLET | Freq: Two times a day (BID) | ORAL | 1 refills | Status: DC

## 2024-01-19 MED ORDER — ESCITALOPRAM OXALATE 20 MG PO TABS: 20.0000 mg | ORAL_TABLET | Freq: Every day | ORAL | 1 refills | Status: AC

## 2024-01-19 MED ORDER — SULFAMETHOXAZOLE-TRIMETHOPRIM 800-160 MG PO TABS: 1.0000 | ORAL_TABLET | Freq: Two times a day (BID) | ORAL | 0 refills | Status: DC

## 2024-01-19 MED ORDER — ALPRAZOLAM 1 MG PO TABS: 1.0000 mg | ORAL_TABLET | Freq: Two times a day (BID) | ORAL | 5 refills | Status: DC

## 2024-01-19 MED ORDER — INSULIN GLARGINE 100 UNIT/ML ~~LOC~~ SOLN: 40.0000 [IU] | Freq: Every day | SUBCUTANEOUS | 0 refills | Status: DC

## 2024-01-19 MED ORDER — CLOPIDOGREL BISULFATE 75 MG PO TABS: 75.0000 mg | ORAL_TABLET | Freq: Every day | ORAL | 3 refills | Status: AC

## 2024-01-19 MED ORDER — DAPAGLIFLOZIN PROPANEDIOL 10 MG PO TABS: 10.0000 mg | ORAL_TABLET | Freq: Every day | ORAL | 3 refills | Status: DC

## 2024-01-19 NOTE — Patient Instructions (Signed)

## 2024-01-19 NOTE — Progress Notes (Signed)
 Subjective:    Patient ID: Kylie Ward, female    DOB: [birth year], 78 y.o.   MRN: [id number]   Chief Complaint: medical management of chronic issues     HPI:  Kylie Ward is a 78 y.o. who identifies as a female who was assigned female at birth.   Social history: Lives with: husband who is her caregiver Work history: retired   Water engineer in today for follow up of the following chronic medical issues:  1. Essential hypertension, benign No c/o chest pain, sob or headache. Does not check blood pressure at home. BP Readings from Last 3 Encounters:  01/02/24 (!) 135/58  11/04/23 (!) 140/58  10/21/23 (!) 182/90     2. Coronary artery disease involving native coronary artery of native heart with unstable angina pectoris [facility]) Patient had left heart cath on 04/07/23. Had follow up with cardiology on 04/21/23. Review of office noted showed no change in plan of care.  3. Type 2 diabetes mellitus with diabetic polyneuropathy, with long-term current use of insulin (HCC) Fasting blood sugars are running around 120-150. Denis any low blood sugars. She had been off of some of her diabetic meds at last check. Was instructed to start back on all of them. Novalog sliding scale and lantus 40u daily. Has libre and checks blood sugars frequently Lab Results  Component Value Date   HGBA1C 8.8 (H) 10/21/2023     4. Hyperlipidemia associated with type 2 diabetes mellitus (HCC) Eats whatever her husband fixes her to eat. Does no exercise at all. Lab Results  Component Value Date   CHOL 143 08/05/2023   HDL 38 (L) 08/05/2023   LDLCALC 41 08/05/2023   TRIG 444 (H) 08/05/2023   CHOLHDL 3.8 08/05/2023     5. Peripheral edema Has occasional edema. Has recently had cellulitis and had to have unna boots. She currently has edema and redness again on right leg. Cardiologist added aldactone to meds to help with edema.  6. Dementia without behavioral disturbance [facility]) Husband says she is  slowly getting worse  7. GAD (generalized anxiety disorder) Stays anxious. Her husband gives her xanax BID    09/05/2023    3:40 PM 08/25/2023    9:27 AM 08/05/2023    2:05 PM 07/11/2023    2:01 PM  GAD 7 : Generalized Anxiety Score  Nervous, Anxious, on Edge 2 2 2 2   Control/stop worrying 2 1 2 1   Worry too much - different things 2 2 3 1   Trouble relaxing 2 1 1 1   Restless 1 1 1 2   Easily annoyed or irritable 2 2 2 2   Afraid - awful might happen 1 1 1  0  Total GAD 7 Score 12 10 12 9   Anxiety Difficulty Somewhat difficult Somewhat difficult Somewhat difficult Not difficult at all      8. Primary insomnia Takes trazadone to sleep and sleeps well.   New complaints: Right lower leg edema and erythema  Allergies  Allergen Reactions   Crestor [Rosuvastatin] Other (See Comments)    Myalgias and arthralgias to the point she had difficulty walking. She was tolerant of lipitor from the family's recall of events.    Niaspan [Niacin Er (Antihyperlipidemic)] Itching    Skin burning   Outpatient Encounter Medications as of 01/19/2024  Medication Sig   Accu-Chek FastClix Lancets MISC CHECK BLOOD SUGAR UP TO 3 TIMES A DAY AS DIRECTED Dx E11.42   ALPRAZolam (XANAX) 1 MG tablet Take 1 tablet (1 mg total)  by mouth 2 (two) times daily.   atorvastatin (LIPITOR) 40 MG tablet Take 1 tablet (40 mg total) by mouth daily.   Blood Glucose Monitoring Suppl (ACCU-CHEK NANO SMARTVIEW) w/Device KIT CHECK BLOOD SUGER UP TO 3 TIMES A DAY   clopidogrel (PLAVIX) 75 MG tablet Take 1 tablet (75 mg total) by mouth daily.   Continuous Blood Gluc Receiver (FREESTYLE LIBRE 2 READER) DEVI Use to test blood sugar continuously. DX: E11.65   Continuous Glucose Sensor (FREESTYLE LIBRE 2 SENSOR) MISC CHANGE SENSOR ON BACK OF ARM EVERY 14 DAYS AS DIRECTED TO CHECK BLOOD SUGAR CONTINUOUSLY   dapagliflozin propanediol (FARXIGA) 10 MG TABS tablet Take 1 tablet (10 mg total) by mouth daily before breakfast.   donepezil  (ARICEPT) 10 MG tablet Take 1 tablet (10 mg total) by mouth at bedtime.   escitalopram (LEXAPRO) 20 MG tablet Take 1 tablet (20 mg total) by mouth daily.   fenofibrate 160 MG tablet Take 1 tablet (160 mg total) by mouth daily.   furosemide (LASIX) 40 MG tablet Take 1 tablet (40 mg total) by mouth daily.   glucose blood (ACCU-CHEK SMARTVIEW) test strip CHECK BLOOD SUGAR UP TO 3 TIMES A DAY AS DIRECTED   insulin aspart (NOVOLOG) 100 UNIT/ML injection Inject 0-10 Units into the skin See admin instructions. 0-10 units per sliding scale, three times daily before meals.   Insulin Pen Needle (SURE COMFORT PEN NEEDLES) 31G X 8 MM MISC USE TO inject lantus ONCE daily   LANTUS 100 UNIT/ML injection INJECT 40 UNITS INTO SKIN AT BEDTIME   lisinopril (ZESTRIL) 20 MG tablet Take 1 tablet (20 mg total) by mouth daily. Hold if systolic blood pressure (top number) is less than 95.   memantine (NAMENDA) 10 MG tablet Take 1 tablet (10 mg total) by mouth 2 (two) times daily.   Menthol-Zinc Oxide (CALMOSEPTINE) 0.44-20.6 % OINT Apply heavily to affected area   metFORMIN (GLUCOPHAGE-XR) 750 MG 24 hr tablet Take 1 tablet (750 mg total) by mouth daily with breakfast.   metoprolol succinate (TOPROL-XL) 25 MG 24 hr tablet Take 1 tablet (25 mg total) by mouth daily.   Multiple Vitamins-Minerals (ONE-A-DAY WOMENS 50+) TABS Take 1 tablet by mouth daily.   nystatin cream (MYCOSTATIN) Apply 1 Application topically 2 (two) times daily.   polyethylene glycol powder (GLYCOLAX/MIRALAX) 17 GM/SCOOP powder Take 17 g by mouth daily.   spironolactone (ALDACTONE) 25 MG tablet Take 1 tablet (25 mg total) by mouth daily.   traZODone (DESYREL) 50 MG tablet Take 1 tablet (50 mg total) by mouth at bedtime.   No facility-administered encounter medications on file as of 01/19/2024.    Past Surgical History:  Procedure Laterality Date   CAROTID ENDARTERECTOMY Right    CAROTID STENT     LEFT HEART CATH AND CORONARY ANGIOGRAPHY N/A 04/07/2023    Procedure: LEFT HEART CATH AND CORONARY ANGIOGRAPHY;  Surgeon: Tonny Bollman, MD;  Location: [facility] INVASIVE CV LAB;  Ward: Cardiovascular;  Laterality: N/A;    Family History  Problem Relation Age of Onset   Heart disease Father    Heart attack Father    Hypertension Father    Diabetes Sister    Stroke Sister    Diabetes Brother    Diabetes Sister    Diabetes Sister    Diabetes Brother    Diabetes Brother    Diabetes Brother       Controlled substance contract: 08/05/23     Review of Systems  Constitutional:  Negative for diaphoresis.  Eyes:  Negative for pain.  Respiratory:  Negative for shortness of breath.   Cardiovascular:  Positive for leg swelling. Negative for chest pain and palpitations.  Gastrointestinal:  Negative for abdominal pain.  Endocrine: Negative for polydipsia.  Skin:  Negative for rash.       Sore on buttocks  Neurological:  Negative for dizziness, weakness and headaches.  Hematological:  Does not bruise/bleed easily.  All other systems reviewed and are negative.      Objective:   Physical Exam Constitutional:      Appearance: Normal appearance.  HENT:     Right Ear: Tympanic membrane normal.     Left Ear: Tympanic membrane normal.     Nose: Nose normal.     Mouth/Throat:     Mouth: Mucous membranes are dry.  Cardiovascular:     Rate and Rhythm: Normal rate and regular rhythm.     Heart sounds: Normal heart sounds.  Pulmonary:     Effort: Pulmonary effort is normal.     Breath sounds: Normal breath sounds.  Musculoskeletal:     Right lower leg: Edema (2+) present.     Left lower leg: Edema (1+) present.  Skin:    General: Skin is warm.     Comments: Right lower ext erythematous and draining clear fluid- see attached picture    Neurological:     General: No focal deficit present.     Mental Status: She is alert and oriented to person, place, and time.  Psychiatric:        Mood and Affect: Mood normal.        Behavior:  Behavior normal.   BP (!) 65/41   Pulse 63   Temp 98.2 F (36.8 C)   Ht 5\' 3"  (1.6 m)   Wt 159 lb 9.6 oz (72.4 kg)   LMP 01/27/1983   SpO2 95%   BMI 28.27 kg/m    LMP 01/27/1983   Hgba1c 9.9%      Assessment & Plan:   Kylie Ward comes in today with chief complaint of Medical Management of Chronic Issues (Check patients right leg)   Diagnosis and orders addressed:  1. Essential hypertension, benign Low sodium diet Hold lisinopril for now Keep diary of blood pressure at home - CBC with Differential/Platelet  2. Coronary artery disease involving native coronary artery of native heart with unstable angina pectoris [facility]) Keep follow up with cardiology  3. Type 2 diabetes mellitus with diabetic polyneuropathy, with long-term current use of insulin (HCC) Continue to watch carbs in diet Increase lantus to 40u daily - Bayer DCA Hb A1c Waived  4. Hyperlipidemia associated with type 2 diabetes mellitus (HCC) Low fat diet - Lipid panel  5. Peripheral edema Elevate legs when sitting Compression socks  6. Dementia without behavioral disturbance (HCC) [place] daily  7. GAD (generalized anxiety disorder) - Drug Screen 10 W/Conf, Se  8. Primary insomnia Bedtime routine - traZODone (DESYREL) 50 MG tablet; Take 1 tablet (50 mg total) by mouth at bedtime.  Dispense: 90 tablet; Refill: 1  9. Cellulitis right lower leg Reapply duoderm Monday Follow up next week Bactrim as prescribed   Labs pending Health Maintenance reviewed Diet and exercise encouraged  Follow up plan: 1 week   [name], FNP

## 2024-01-20 LAB — LIPID PANEL
Chol/HDL Ratio: 4.3 ratio (ref 0.0–4.4)
Cholesterol, Total: 191 mg/dL (ref 100–199)
HDL: 44 mg/dL (ref >39)
LDL Chol Calc (NIH): 95 mg/dL (ref 0–99)
Triglycerides: 310 mg/dL — ABNORMAL HIGH (ref 0–149)
VLDL Cholesterol Cal: 52 mg/dL — ABNORMAL HIGH (ref 5–40)

## 2024-01-20 LAB — CBC WITH DIFFERENTIAL/PLATELET
Basophils Absolute: 0 10*3/uL (ref 0.0–0.2)
Basos: 1 %
EOS (ABSOLUTE): 0.1 10*3/uL (ref 0.0–0.4)
Eos: 1 %
Hematocrit: 36.1 % (ref 34.0–46.6)
Hemoglobin: 12 g/dL (ref 11.1–15.9)
Immature Grans (Abs): 0 10*3/uL (ref 0.0–0.1)
Immature Granulocytes: 0 %
Lymphocytes Absolute: 1.3 10*3/uL (ref 0.7–3.1)
Lymphs: 15 %
MCH: 29.9 pg (ref 26.6–33.0)
MCHC: 33.2 g/dL (ref 31.5–35.7)
MCV: 90 fL (ref 79–97)
Monocytes Absolute: 0.6 10*3/uL (ref 0.1–0.9)
Monocytes: 7 %
Neutrophils Absolute: 6.7 10*3/uL (ref 1.4–7.0)
Neutrophils: 76 %
Platelets: 256 10*3/uL (ref 150–450)
RBC: 4.02 x10E6/uL (ref 3.77–5.28)
RDW: 12.9 % (ref 11.7–15.4)
WBC: 8.8 10*3/uL (ref 3.4–10.8)

## 2024-01-20 LAB — CMP14+EGFR
ALT: 9 IU/L (ref 0–32)
AST: 15 IU/L (ref 0–40)
Albumin: 4.4 g/dL (ref 3.8–4.8)
Alkaline Phosphatase: 118 IU/L (ref 44–121)
BUN/Creatinine Ratio: 15 (ref 12–28)
BUN: 41 mg/dL — ABNORMAL HIGH (ref 8–27)
Bilirubin Total: <0.2 mg/dL (ref 0.0–1.2)
CO2: 22 mmol/L (ref 20–29)
Calcium: 9 mg/dL (ref 8.7–10.3)
Chloride: 100 mmol/L (ref 96–106)
Creatinine, Ser: 2.68 mg/dL — ABNORMAL HIGH (ref 0.57–1.00)
Globulin, Total: 2.3 g/dL (ref 1.5–4.5)
Glucose: 277 mg/dL — ABNORMAL HIGH (ref 70–99)
Potassium: 5.2 mmol/L (ref 3.5–5.2)
Sodium: 136 mmol/L (ref 134–144)
Total Protein: 6.7 g/dL (ref 6.0–8.5)
eGFR: 18 mL/min/{1.73_m2} — ABNORMAL LOW (ref >59)

## 2024-01-20 LAB — BAYER DCA HB A1C WAIVED: HB A1C (BAYER DCA - WAIVED): 9.2 % — ABNORMAL HIGH (ref 4.8–5.6)

## 2024-01-25 ENCOUNTER — Ambulatory Visit (INDEPENDENT_AMBULATORY_CARE_PROVIDER_SITE_OTHER): Admitting: Nurse Practitioner

## 2024-01-25 ENCOUNTER — Encounter: Payer: Self-pay | Admitting: Nurse Practitioner

## 2024-01-25 VITALS — BP 128/49 | HR 58 | Temp 98.0°F | Ht 62.5 in | Wt 158.0 lb

## 2024-01-25 DIAGNOSIS — F5101 Primary insomnia: Secondary | ICD-10-CM

## 2024-01-25 DIAGNOSIS — I739 Peripheral vascular disease, unspecified: Secondary | ICD-10-CM

## 2024-01-25 NOTE — Patient Instructions (Signed)
 Insomnia Insomnia is a sleep disorder that makes it difficult to fall asleep or stay asleep. Insomnia can cause fatigue, low energy, difficulty concentrating, mood swings, and poor performance at work or school. There are three different ways to classify insomnia: Difficulty falling asleep. Difficulty staying asleep. Waking up too early in the morning. Any type of insomnia can be long-term (chronic) or short-term (acute). Both are common. Short-term insomnia usually lasts for 3 months or less. Chronic insomnia occurs at least three times a week for longer than 3 months. What are the causes? Insomnia may be caused by another condition, situation, or substance, such as: Having certain mental health conditions, such as anxiety and depression. Using caffeine, alcohol, tobacco, or drugs. Having gastrointestinal conditions, such as gastroesophageal reflux disease (GERD). Having certain medical conditions. These include: Asthma. Alzheimer's disease. Stroke. Chronic pain. An overactive thyroid gland (hyperthyroidism). Other sleep disorders, such as restless legs syndrome and sleep apnea. Menopause. Sometimes, the cause of insomnia may not be known. What increases the risk? Risk factors for insomnia include: Gender. Females are affected more often than males. Age. Insomnia is more common as people get older. Stress and certain medical and mental health conditions. Lack of exercise. Having an irregular work schedule. This may include working night shifts and traveling between different time zones. What are the signs or symptoms? If you have insomnia, the main symptom is having trouble falling asleep or having trouble staying asleep. This may lead to other symptoms, such as: Feeling tired or having low energy. Feeling nervous about going to sleep. Not feeling rested in the morning. Having trouble concentrating. Feeling irritable, anxious, or depressed. How is this diagnosed? This condition  may be diagnosed based on: Your symptoms and medical history. Your health care provider may ask about: Your sleep habits. Any medical conditions you have. Your mental health. A physical exam. How is this treated? Treatment for insomnia depends on the cause. Treatment may focus on treating an underlying condition that is causing the insomnia. Treatment may also include: Medicines to help you sleep. Counseling or therapy. Lifestyle adjustments to help you sleep better. Follow these instructions at home: Eating and drinking  Limit or avoid alcohol, caffeinated beverages, and products that contain nicotine and tobacco, especially close to bedtime. These can disrupt your sleep. Do not eat a large meal or eat spicy foods right before bedtime. This can lead to digestive discomfort that can make it hard for you to sleep. Sleep habits  Keep a sleep diary to help you and your health care provider figure out what could be causing your insomnia. Write down: When you sleep. When you wake up during the night. How well you sleep and how rested you feel the next day. Any side effects of medicines you are taking. What you eat and drink. Make your bedroom a dark, comfortable place where it is easy to fall asleep. Put up shades or blackout curtains to block light from outside. Use a white noise machine to block noise. Keep the temperature cool. Limit screen use before bedtime. This includes: Not watching TV. Not using your smartphone, tablet, or computer. Stick to a routine that includes going to bed and waking up at the same times every day and night. This can help you fall asleep faster. Consider making a quiet activity, such as reading, part of your nighttime routine. Try to avoid taking naps during the day so that you sleep better at night. Get out of bed if you are still awake after  15 minutes of trying to sleep. Keep the lights down, but try reading or doing a quiet activity. When you feel  sleepy, go back to bed. General instructions Take over-the-counter and prescription medicines only as told by your health care provider. Exercise regularly as told by your health care provider. However, avoid exercising in the hours right before bedtime. Use relaxation techniques to manage stress. Ask your health care provider to suggest some techniques that may work well for you. These may include: Breathing exercises. Routines to release muscle tension. Visualizing peaceful scenes. Make sure that you drive carefully. Do not drive if you feel very sleepy. Keep all follow-up visits. This is important. Contact a health care provider if: You are tired throughout the day. You have trouble in your daily routine due to sleepiness. You continue to have sleep problems, or your sleep problems get worse. Get help right away if: You have thoughts about hurting yourself or someone else. Get help right away if you feel like you may hurt yourself or others, or have thoughts about taking your own life. Go to your nearest emergency room or: Call 911. Call the National Suicide Prevention Lifeline at (906)021-1611 or 988. This is open 24 hours a day. Text the Crisis Text Line at 325-069-2793. Summary Insomnia is a sleep disorder that makes it difficult to fall asleep or stay asleep. Insomnia can be long-term (chronic) or short-term (acute). Treatment for insomnia depends on the cause. Treatment may focus on treating an underlying condition that is causing the insomnia. Keep a sleep diary to help you and your health care provider figure out what could be causing your insomnia. This information is not intended to replace advice given to you by your health care provider. Make sure you discuss any questions you have with your health care provider. Document Revised: 09/28/2021 Document Reviewed: 09/28/2021 Elsevier Patient Education  2024 [employer].

## 2024-01-25 NOTE — Progress Notes (Signed)
   Subjective:    Patient ID: Kylie Ward, female    DOB: January 31, 1946, 78 y.o.   MRN: [id number]   Chief Complaint: recheck leg  HPI  Patient was seen on 01/19/24 with resolving cellulitis. She was completing Bactrim. Duoderm was applied to open wound on right lower shin. She is here today for recheck. Her husband says leg looks better to him.  She saw peripheral vascular doctor on 01/02/24 and he has scheduled her for vas [place] ABI with and WO TBI Not sleeping well. Goes to bed around 10pm. Wakes up around 12AM and has to go to  the bathroom, then she cant go back to sleep. She takes trazadone to sleep.   Patient Active Problem List   Diagnosis Date Noted   Primary insomnia 05/03/2023   Chronic midline low back pain without sciatica 05/03/2023   Coronary artery disease involving native coronary artery of native heart with unstable angina pectoris (HCC) 04/08/2023   Dementia without behavioral disturbance (HCC) 04/07/2023   Drug-induced myopathy 10/30/2020   Type 2 diabetes mellitus with diabetic polyneuropathy, with long-term current use of insulin (HCC) 03/06/2018   Atherosclerosis of aorta (HCC) 09/04/2016   Peripheral edema 11/13/2015   GAD (generalized anxiety disorder) 08/12/2015   BMI 31.0-31.9,adult 05/08/2015   Carotid artery stenosis, symptomatic 01/14/2014   History of CVA (cerebrovascular accident) 12/10/2013   Hyperlipidemia associated with type 2 diabetes mellitus (HCC) 01/26/2013   Essential hypertension, benign 01/26/2013       Review of Systems  Constitutional:  Negative for diaphoresis.  Eyes:  Negative for pain.  Respiratory:  Negative for shortness of breath.   Cardiovascular:  Negative for chest pain, palpitations and leg swelling.  Gastrointestinal:  Negative for abdominal pain.  Endocrine: Negative for polydipsia.  Skin:  Positive for wound (right lower shin). Negative for rash.  Neurological:  Negative for dizziness, weakness and headaches.   Hematological:  Does not bruise/bleed easily.  All other systems reviewed and are negative.      Objective:   Physical Exam Constitutional:      Appearance: Normal appearance.  Cardiovascular:     Rate and Rhythm: Normal rate and regular rhythm.     Heart sounds: Normal heart sounds.  Skin:    General: Skin is warm.  Neurological:     General: No focal deficit present.     Mental Status: She is alert and oriented to person, place, and time.  Psychiatric:        Mood and Affect: Mood normal.        Behavior: Behavior normal.           Assessment & Plan:   Kylie Ward in today with chief complaint of Wound Check (Right leg )   1. Peripheral vascular disease (HCC) (Primary) Wear compression hose Elevate leg Keep appointment for study tomorrow  2. Primary insomnia Increase trazadone to 2 tablets at bedtime and let me now is helps. If doent we will tyr something else for sleep.    The above assessment and management plan was discussed with the patient. The patient verbalized understanding of and has agreed to the management plan. Patient is aware to call the clinic if symptoms persist or worsen. Patient is aware when to return to the clinic for a follow-up visit. Patient educated on when it is appropriate to go to the emergency department.   [name], FNP

## 2024-01-26 ENCOUNTER — Ambulatory Visit (HOSPITAL_COMMUNITY)
Admission: RE | Admit: 2024-01-26 | Discharge: 2024-01-26 | Disposition: A | Discharge status: Home / Self Care | Source: Ambulatory Visit | Attending: Cardiology | Admitting: Cardiology

## 2024-01-26 DIAGNOSIS — I739 Peripheral vascular disease, unspecified: Secondary | ICD-10-CM | POA: Diagnosis not present

## 2024-01-26 LAB — VAS US ABI WITH/WO TBI
Left ABI: 0.42
Right ABI: 0.52

## 2024-02-02 ENCOUNTER — Ambulatory Visit: Payer: Medicare Other | Attending: Cardiovascular Disease | Admitting: Cardiovascular Disease

## 2024-02-02 ENCOUNTER — Encounter: Payer: Self-pay | Admitting: Cardiovascular Disease

## 2024-02-02 VITALS — BP 108/60 | HR 69 | Ht 62.0 in | Wt 154.4 lb

## 2024-02-02 DIAGNOSIS — I2510 Atherosclerotic heart disease of native coronary artery without angina pectoris: Secondary | ICD-10-CM | POA: Diagnosis not present

## 2024-02-02 DIAGNOSIS — I739 Peripheral vascular disease, unspecified: Secondary | ICD-10-CM | POA: Diagnosis not present

## 2024-02-02 DIAGNOSIS — I6529 Occlusion and stenosis of unspecified carotid artery: Secondary | ICD-10-CM

## 2024-02-02 DIAGNOSIS — I5020 Unspecified systolic (congestive) heart failure: Secondary | ICD-10-CM | POA: Diagnosis not present

## 2024-02-02 NOTE — Patient Instructions (Addendum)
 Testing/Procedure: Ambulatory referral to Vascular Surgery  Follow-Up: At [facility], you and your health needs are our priority.  As part of our continuing mission to provide you with exceptional heart care, our providers are all part of one team.  This team includes your primary Cardiologist (physician) and Advanced Practice Providers or APPs (Physician Assistants and Nurse Practitioners) who all work together to provide you with the care you need, when you need it.  Your next appointment:   PRN  Provider:   Tonny Bollman, MD     1st Floor: - Lobby - Registration  - Pharmacy  - Lab - Cafe  2nd Floor: - PV Lab - Diagnostic Testing (echo, CT, nuclear med)  3rd Floor: - Vacant  4th Floor: - TCTS (cardiothoracic surgery) - AFib Clinic - Structural Heart Clinic - Vascular Surgery  - Vascular Ultrasound  5th Floor: - HeartCare Cardiology (general and EP) - Clinical Pharmacy for coumadin, hypertension, lipid, weight-loss medications, and med management appointments    Valet parking services will be available as well.

## 2024-02-02 NOTE — Progress Notes (Signed)
 Cardiology Office Note:    Date:  02/05/2024   ID:  Kylie Ward, DOB 10-May-1946, MRN [id number]  PCP:  Bennie Pierini, FNP   [facility] Providers Cardiologist:  None     Referring MD: Bennie Pierini, *   Chief Complaint  Patient presents with   Coronary Artery Disease    History of Present Illness:    Kylie Ward is a 78 y.o. female with a hx of left main and multivessel coronary artery disease, presenting for follow-up evaluation. The patient was hospitalized in June 2024 with heart failure and elevated troponin. She underwent cardiac catheterization demonstrating left main and severe multivessel disease. She was not a candidate for CABG with multiple medical problems and dementia. She was seen back in follow-up a few weeks after discharge and was doing well with no ongoing symptoms of heart failure or chest pain. Current medicines were continued. She has left subclavian stenosis and carotid artery disease. She was seen in outpatient follow-up by vascular surgery who recommended ongoing medical therapy.   The patient is here with her husband and daughter today.  I saw her last in October 2024.  At that time we discussed recommendations for ongoing medical therapy with respect to her CAD and heart failure.  She recently established with Dr. Elwyn Lade in the advanced heart failure clinic.  She was started on Farxiga and continued on her ACE inhibitor, beta-blocker, and aldosterone antagonist.  She developed symptomatic hypotension and her lisinopril was discontinued recently.  She is doing better since that time.  With her extensive history of carotid and vascular disease, she underwent lower extremity Dopplers demonstrating moderate to severe reduction in arterial flow bilaterally.  The patient has had right leg cellulitis that is healing.  She denies chest pain, shortness of breath, orthopnea, or PND.   Current Medications: Current Meds  Medication Sig    Accu-Chek FastClix Lancets MISC CHECK BLOOD SUGAR UP TO 3 TIMES A DAY AS DIRECTED Dx E11.42   ALPRAZolam (XANAX) 1 MG tablet Take 1 tablet (1 mg total) by mouth 2 (two) times daily.   atorvastatin (LIPITOR) 40 MG tablet Take 1 tablet (40 mg total) by mouth daily.   clopidogrel (PLAVIX) 75 MG tablet Take 1 tablet (75 mg total) by mouth daily.   dapagliflozin propanediol (FARXIGA) 10 MG TABS tablet Take 1 tablet (10 mg total) by mouth daily before breakfast.   donepezil (ARICEPT) 10 MG tablet Take 1 tablet (10 mg total) by mouth at bedtime.   escitalopram (LEXAPRO) 20 MG tablet Take 1 tablet (20 mg total) by mouth daily.   fenofibrate 160 MG tablet Take 1 tablet (160 mg total) by mouth daily.   furosemide (LASIX) 40 MG tablet Take 1 tablet (40 mg total) by mouth daily.   glucose blood (ACCU-CHEK SMARTVIEW) test strip CHECK BLOOD SUGAR UP TO 3 TIMES A DAY AS DIRECTED   insulin aspart (NOVOLOG) 100 UNIT/ML injection Inject 0-10 Units into the skin See admin instructions. 0-10 units per sliding scale, three times daily before meals.   insulin glargine (LANTUS) 100 UNIT/ML injection Inject 0.4 mLs (40 Units total) into the skin daily.   Insulin Pen Needle (SURE COMFORT PEN NEEDLES) 31G X 8 MM MISC USE TO inject lantus ONCE daily   memantine (NAMENDA) 10 MG tablet Take 1 tablet (10 mg total) by mouth 2 (two) times daily.   Menthol-Zinc Oxide (CALMOSEPTINE) 0.44-20.6 % OINT Apply heavily to affected area   metFORMIN (GLUCOPHAGE-XR) 750 MG 24 hr  tablet Take 1 tablet (750 mg total) by mouth daily with breakfast.   metoprolol succinate (TOPROL-XL) 25 MG 24 hr tablet Take 1 tablet (25 mg total) by mouth daily.   Multiple Vitamins-Minerals (ONE-A-DAY WOMENS 50+) TABS Take 1 tablet by mouth daily.   polyethylene glycol powder (GLYCOLAX/MIRALAX) 17 GM/SCOOP powder Take 17 g by mouth daily.   spironolactone (ALDACTONE) 25 MG tablet Take 1 tablet (25 mg total) by mouth daily.   traZODone (DESYREL) 50 MG tablet  Take 1 tablet (50 mg total) by mouth at bedtime.   [DISCONTINUED] Blood Glucose Monitoring Suppl (ACCU-CHEK NANO SMARTVIEW) w/Device KIT CHECK BLOOD SUGER UP TO 3 TIMES A DAY   [DISCONTINUED] Continuous Blood Gluc Receiver (FREESTYLE LIBRE 2 READER) DEVI Use to test blood sugar continuously. DX: E11.65   [DISCONTINUED] Continuous Glucose Sensor (FREESTYLE LIBRE 2 SENSOR) MISC CHANGE SENSOR ON BACK OF ARM EVERY 14 DAYS AS DIRECTED TO CHECK BLOOD SUGAR CONTINUOUSLY   [DISCONTINUED] nystatin cream (MYCOSTATIN) Apply 1 Application topically 2 (two) times daily.   [DISCONTINUED] sulfamethoxazole-trimethoprim (BACTRIM DS) 800-160 MG tablet Take 1 tablet by mouth 2 (two) times daily.     Allergies:   Crestor [rosuvastatin] and Niaspan [niacin er (antihyperlipidemic)]   ROS:   Please see the history of present illness.    All other systems reviewed and are negative.  EKGs/Labs/Other Studies Reviewed:    The following studies were reviewed today: Cardiac Studies & Procedures   ______________________________________________________________________________________________ CARDIAC CATHETERIZATION  CARDIAC CATHETERIZATION 04/07/2023  Narrative 1.  Severe proximal and moderate distal left main disease of 70 and 50% 2.  Severe mid LAD stenosis of 80% 3.  Severe ostial and mid circumflex stenoses of 80 and 90% 4.  Total occlusion of the mid RCA with left-to-right collaterals 5.  Severely elevated LVEDP of 29 mmHg  The patient has severe calcific multivessel coronary artery disease including left main disease, total occlusion of the RCA, and severe stenoses in the LAD and circumflex vessels.  Due to comorbid conditions, she does not appear to be a candidate for CABG.  Favor medical therapy for coronary artery disease and continued diuresis for treatment of heart failure.  Further management and care per primary cardiology team.  Findings Coronary Findings Diagnostic  Dominance: Right  Left Main Ost  LM to Mid LM lesion is 70% stenosed. The lesion is calcified. Pressure dampening with catheter engagement Mid LM to Dist LM lesion is 50% stenosed.  Left Anterior Descending Prox LAD to Mid LAD lesion is 80% stenosed. The lesion is calcified.  Left Circumflex Ost Cx to Prox Cx lesion is 80% stenosed. Mid Cx lesion is 90% stenosed.  Right Coronary Artery Prox RCA to Mid RCA lesion is 100% stenosed. The lesion is calcified.  Right Posterior Descending Artery Collaterals RPDA filled by collaterals from 1st Sept.  Collaterals RPDA filled by collaterals from 2nd Sept.  Intervention  No interventions have been documented.     ECHOCARDIOGRAM  ECHOCARDIOGRAM COMPLETE 08/04/2023  Narrative ECHOCARDIOGRAM REPORT    Patient Name:   Kylie Ward Date of Exam: 08/04/2023 Medical Rec #:  [id number]         Height:       64.0 in Accession #:    [id number]        Weight:       158.0 lb Date of Birth:  1946/06/15         BSA:          1.770 m Patient Age:  77 years          BP:           142/62 mmHg Patient Gender: F                 HR:           60 bpm. Exam Location:  [address]  Procedure: 2D Echo, Cardiac Doppler, Color Doppler and Intracardiac Opacification Agent  Indications:    I25.5 Ischemic cardiomyopathy  History:        Patient has prior history of Echocardiogram examinations, most recent 04/08/2023. Stroke; Risk Factors:Hypertension, Diabetes and Dyslipidemia.  Sonographer:    Sedonia Small Rodgers-Jones RDCS Referring Phys: [id number] TESSA N CONTE  IMPRESSIONS   1. Left ventricular ejection fraction, by estimation, is 40 to 45%. The left ventricle has mildly decreased function. The left ventricle demonstrates regional wall motion abnormalities (see scoring diagram/findings for description). There is moderate asymmetric left ventricular hypertrophy of the septal segment. Left ventricular diastolic parameters are indeterminate. 2. Right ventricular systolic function  is normal. The right ventricular size is normal. Tricuspid regurgitation signal is inadequate for assessing PA pressure. 3. Left atrial size was mild to moderately dilated. 4. The mitral valve is degenerative. Mild to moderate mitral valve regurgitation. Mild mitral stenosis. The mean mitral valve gradient is 3.5 mmHg with average heart rate of 63 bpm, with DVI (lvot/mv tvi) of 0.48. Severe mitral annular calcification. 5. The aortic valve is grossly normal. There is mild calcification of the aortic valve. Aortic valve regurgitation is not visualized. No aortic stenosis is present. 6. The inferior vena cava is normal in size with greater than 50% respiratory variability, suggesting right atrial pressure of 3 mmHg.  FINDINGS Left Ventricle: Left ventricular ejection fraction, by estimation, is 40 to 45%. The left ventricle has mildly decreased function. The left ventricle demonstrates regional wall motion abnormalities. Definity contrast agent was given IV to delineate the left ventricular endocardial borders. The left ventricular internal cavity size was normal in size. There is moderate asymmetric left ventricular hypertrophy of the septal segment. Left ventricular diastolic parameters are indeterminate.   LV Wall Scoring: The apical septal segment and apical inferior segment are akinetic. The mid anteroseptal segment and apical anterior segment are hypokinetic.  Right Ventricle: The right ventricular size is normal. No increase in right ventricular wall thickness. Right ventricular systolic function is normal. Tricuspid regurgitation signal is inadequate for assessing PA pressure.  Left Atrium: Left atrial size was mild to moderately dilated.  Right Atrium: Right atrial size was normal in size.  Pericardium: There is no evidence of pericardial effusion.  The mitral valve is degenerative in appearance. Severe mitral annular calcification. Mild to moderate mitral valve regurgitation. Mild  mitral valve stenosis. The mean mitral valve gradient is 3.5 mmHg with average heart rate of 63 bpm. Tricuspid Valve: The tricuspid valve is normal in structure. Tricuspid valve regurgitation is trivial. No evidence of tricuspid stenosis.  Aortic Valve: The aortic valve is grossly normal. There is mild calcification of the aortic valve. Aortic valve regurgitation is not visualized. No aortic stenosis is present.  Pulmonic Valve: The pulmonic valve was normal in structure. Pulmonic valve regurgitation is trivial. No evidence of pulmonic stenosis.  Aorta: The aortic root is normal in size and structure.  Venous: The inferior vena cava is normal in size with greater than 50% respiratory variability, suggesting right atrial pressure of 3 mmHg.  IAS/Shunts: No atrial level shunt detected by color flow Doppler.   LEFT  VENTRICLE PLAX 2D LVIDd:         4.90 cm   Diastology LVIDs:         3.90 cm   LV e' medial:    3.97 cm/s LV PW:         1.00 cm   LV E/e' medial:  31.0 LV IVS:        1.40 cm   LV e' lateral:   7.78 cm/s LVOT diam:     1.70 cm   LV E/e' lateral: 15.8 LV SV:         46 LV SV Index:   26 LVOT Area:     2.27 cm   RIGHT VENTRICLE            IVC RV Basal diam:  3.60 cm    IVC diam: 1.30 cm RV S prime:     9.79 cm/s TAPSE (M-mode): 2.1 cm  LEFT ATRIUM             Index        RIGHT ATRIUM          Index LA diam:        4.50 cm 2.54 cm/m   RA Area:     8.30 cm LA Vol (A2C):   69.8 ml 39.44 ml/m  RA Volume:   16.20 ml 9.15 ml/m LA Vol (A4C):   55.7 ml 31.47 ml/m LA Biplane Vol: 64.1 ml 36.22 ml/m AORTIC VALVE LVOT Vmax:   81.00 cm/s LVOT Vmean:  56.425 cm/s LVOT VTI:    0.203 m  AORTA Ao Asc diam: 3.10 cm  MITRAL VALVE MV Area (PHT): 2.78 cm     SHUNTS MV Peak grad:  10.5 mmHg    Systemic VTI:  0.20 m MV Mean grad:  3.5 mmHg     Systemic Diam: 1.70 cm MV Vmax:       1.62 m/s MV Decel Time: 273 msec MV E velocity: 123.00 cm/s MV A velocity: 161.00 cm/s MV  E/A ratio:  0.76  Weston Brass MD Electronically signed by Weston Brass MD Signature Date/Time: 08/06/2023/12:12:03 PM    Final          ______________________________________________________________________________________________      EKG:        Recent Labs: 04/07/2023: B Natriuretic Peptide 870.1; TSH 2.254 04/08/2023: Magnesium 1.8 01/19/2024: ALT 9; BUN 41; Creatinine, Ser 2.68; Hemoglobin 12.0; Platelets 256; Potassium 5.2; Sodium 136  Recent Lipid Panel    Component Value Date/Time   CHOL 191 01/19/2024 1633   CHOL 201 (H) 05/11/2013 1735   TRIG 310 (H) 01/19/2024 1633   TRIG 349 (H) 12/20/2014 1500   TRIG 324 (H) 05/11/2013 1735   HDL 44 01/19/2024 1633   HDL 36 (L) 12/20/2014 1500   HDL 41 05/11/2013 1735   CHOLHDL 4.3 01/19/2024 1633   LDLCALC 95 01/19/2024 1633   LDLCALC 56 05/21/2014 1554   LDLCALC 95 05/11/2013 1735            Physical Exam:    VS:  BP 108/60   Pulse 69   Ht 5\' 2"  (1.575 m)   Wt 154 lb 6.4 oz (70 kg)   LMP 01/27/1983   SpO2 92%   BMI 28.24 kg/m     Wt Readings from Last 3 Encounters:  02/02/24 154 lb 6.4 oz (70 kg)  01/25/24 158 lb (71.7 kg)  01/19/24 159 lb 9.6 oz (72.4 kg)     GEN:  Well nourished, well developed  in no acute distress HEENT: Normal NECK: No JVD; No carotid bruits LYMPHATICS: No lymphadenopathy CARDIAC: RRR, no murmurs, rubs, gallops RESPIRATORY:  Clear to auscultation without rales, wheezing or rhonchi  ABDOMEN: Soft, non-tender, non-distended MUSCULOSKELETAL: There is 1+ edema of the right leg with evidence of scaling and appearance of healing cellulitis SKIN: Warm and dry NEUROLOGIC:  Alert and oriented x 3 PSYCHIATRIC:  Normal affect   Assessment & Plan Claudication [facility]) Patient with recent history of lower extremity cellulitis.  Recent vascular study demonstrated reduced ABIs bilaterally of 0.52 on the right and 0.42 on the left.  Will refer back to vascular for further evaluation.  I  suspect ongoing medical therapy will be recommended in light of her comorbidities, as long as there is no evidence of critical limb ischemia. Stenosis of carotid artery, unspecified laterality Most recent carotid Doppler reviewed demonstrating less than 40% stenosis bilaterally.  The right subclavian artery is stenotic in the left subclavian artery is severely stenotic with monophasic waveforms.  Patient on atorvastatin and clopidogrel.  Advised to avoid blood pressure checked on the left side as it will not be accurate.  No symptoms of arm claudication. HFrEF (heart failure with reduced ejection fraction) (HCC) ACE inhibitor recently stopped due to symptomatic hypotension.  Continue follow-up in the advanced heart failure clinic.  Treated with Farxiga, furosemide, spironolactone, and metoprolol succinate. Coronary artery disease involving native coronary artery of native heart without angina pectoris Stable without angina.  Not a candidate for revascularization.  Medical therapy as above, on clopidogrel and high intensity statin drug.  For follow-up I will see back as needed. Seeing Dr Elwyn Lade back for a 6 month visit. Happy to see in the future if she graduates from HF Clinic or if coronary issues arise.      Medication Adjustments/Labs and Tests Ordered: Current medicines are reviewed at length with the patient today.  Concerns regarding medicines are outlined above.  Orders Placed This Encounter  Procedures   Ambulatory referral to Vascular Surgery   No orders of the defined types were placed in this encounter.   Patient Instructions  Testing/Procedure: Ambulatory referral to Vascular Surgery  Follow-Up: At [facility], you and your health needs are our priority.  As part of our continuing mission to provide you with exceptional heart care, our providers are all part of one team.  This team includes your primary Cardiologist (physician) and Advanced Practice Providers or APPs  (Physician Assistants and Nurse Practitioners) who all work together to provide you with the care you need, when you need it.  Your next appointment:   PRN  Provider:   Tonny Bollman, MD     1st Floor: - Lobby - Registration  - Pharmacy  - Lab - Cafe  2nd Floor: - PV Lab - Diagnostic Testing (echo, CT, nuclear med)  3rd Floor: - Vacant  4th Floor: - TCTS (cardiothoracic surgery) - AFib Clinic - Structural Heart Clinic - Vascular Surgery  - Vascular Ultrasound  5th Floor: - [facility]Care Cardiology (general and EP) - Clinical Pharmacy for coumadin, hypertension, lipid, weight-loss medications, and med management appointments    Valet parking services will be available as well.     Signed, Tonny Bollman, MD  02/05/2024 10:11 AM     [facility]

## 2024-02-05 ENCOUNTER — Encounter: Payer: Self-pay | Admitting: Cardiovascular Disease

## 2024-02-08 ENCOUNTER — Other Ambulatory Visit: Payer: Self-pay | Admitting: Nurse Practitioner

## 2024-02-20 DIAGNOSIS — I509 Heart failure, unspecified: Secondary | ICD-10-CM | POA: Diagnosis not present

## 2024-02-20 DIAGNOSIS — N390 Urinary tract infection, site not specified: Secondary | ICD-10-CM | POA: Diagnosis not present

## 2024-02-20 DIAGNOSIS — R110 Nausea: Secondary | ICD-10-CM | POA: Diagnosis not present

## 2024-02-20 DIAGNOSIS — E119 Type 2 diabetes mellitus without complications: Secondary | ICD-10-CM | POA: Diagnosis not present

## 2024-02-20 DIAGNOSIS — R0602 Shortness of breath: Secondary | ICD-10-CM | POA: Diagnosis not present

## 2024-02-20 DIAGNOSIS — R739 Hyperglycemia, unspecified: Secondary | ICD-10-CM | POA: Diagnosis not present

## 2024-02-24 ENCOUNTER — Encounter: Payer: Self-pay | Admitting: Nurse Practitioner

## 2024-02-24 ENCOUNTER — Ambulatory Visit (INDEPENDENT_AMBULATORY_CARE_PROVIDER_SITE_OTHER): Admitting: Nurse Practitioner

## 2024-02-24 VITALS — BP 91/65 | HR 68 | Temp 97.6°F | Ht 62.0 in | Wt 157.0 lb

## 2024-02-24 DIAGNOSIS — Z09 Encounter for follow-up examination after completed treatment for conditions other than malignant neoplasm: Secondary | ICD-10-CM

## 2024-02-24 DIAGNOSIS — I509 Heart failure, unspecified: Secondary | ICD-10-CM | POA: Diagnosis not present

## 2024-02-24 NOTE — Patient Instructions (Signed)
 Peripheral Edema  Peripheral edema is swelling that is caused by a buildup of fluid. Peripheral edema most often affects the lower legs, ankles, and feet. It can also develop in the arms, hands, and face. The area of the body that has peripheral edema will look swollen. It may also feel heavy or warm. Your clothes may start to feel tight. Pressing on the area may make a temporary dent in your skin (pitting edema). You may not be able to move your swollen arm or leg as much as usual. There are many causes of peripheral edema. It can happen because of a complication of other conditions such as heart failure, kidney disease, or a problem with your circulation. It also can be a side effect of certain medicines or happen because of an infection. It often happens to women during pregnancy. Sometimes, the cause is not known. Follow these instructions at home: Managing pain, stiffness, and swelling  Raise (elevate) your legs while you are sitting or lying down. Move around often to prevent stiffness and to reduce swelling. Do not sit or stand for long periods of time. Do not wear tight clothing. Do not wear garters on your upper legs. Exercise your legs to get your circulation going. This helps to move the fluid back into your blood vessels, and it may help the swelling go down. Wear compression stockings as told by your health care provider. These stockings help to prevent blood clots and reduce swelling in your legs. It is important that these are the correct size. These stockings should be prescribed by your doctor to prevent possible injuries. If elastic bandages or wraps are recommended, use them as told by your health care provider. Medicines Take over-the-counter and prescription medicines only as told by your health care provider. Your health care provider may prescribe medicine to help your body get rid of excess water (diuretic). Take this medicine if you are told to take it. General  instructions Eat a low-salt (low-sodium) diet as told by your health care provider. Sometimes, eating less salt may reduce swelling. Pay attention to any changes in your symptoms. Moisturize your skin daily to help prevent skin from cracking and draining. Keep all follow-up visits. This is important. Contact a health care provider if: You have a fever. You have swelling in only one leg. You have increased swelling, redness, or pain in one or both of your legs. You have drainage or sores at the area where you have edema. Get help right away if: You have edema that starts suddenly or is getting worse, especially if you are pregnant or have a medical condition. You develop shortness of breath, especially when you are lying down. You have pain in your chest or abdomen. You feel weak. You feel like you will faint. These symptoms may be an emergency. Get help right away. Call 911. Do not wait to see if the symptoms will go away. Do not drive yourself to the hospital. Summary Peripheral edema is swelling that is caused by a buildup of fluid. Peripheral edema most often affects the lower legs, ankles, and feet. Move around often to prevent stiffness and to reduce swelling. Do not sit or stand for long periods of time. Pay attention to any changes in your symptoms. Contact a health care provider if you have edema that starts suddenly or is getting worse, especially if you are pregnant or have a medical condition. Get help right away if you develop shortness of breath, especially when lying down.  This information is not intended to replace advice given to you by your health care provider. Make sure you discuss any questions you have with your health care provider. Document Revised: 06/22/2021 Document Reviewed: 06/22/2021 Elsevier Patient Education  2024 [employer].

## 2024-02-24 NOTE — Progress Notes (Signed)
 Subjective:    Patient ID: Kylie Ward, female    DOB: 02/05/1946, 78 y.o.   MRN: [id number]   Chief Complaint: Hospitalization Follow-up   HPI  Patients [facility] with [facility]. She was dx with UTI and mild congestive heart failure. They started her on cipro  and sent her home. She is doing better. Mild sob , no increase in edema. BNP was 243 Patient Active Problem List   Diagnosis Date Noted   Primary insomnia 05/03/2023   Chronic midline low back pain without sciatica 05/03/2023   Coronary artery disease involving native coronary artery of native heart with unstable angina pectoris (HCC) 04/08/2023   Dementia without behavioral disturbance (HCC) 04/07/2023   Drug-induced myopathy 10/30/2020   Type 2 diabetes mellitus with diabetic polyneuropathy, with long-term current use of insulin  (HCC) 03/06/2018   Atherosclerosis of aorta (HCC) 09/04/2016   Peripheral edema 11/13/2015   GAD (generalized anxiety disorder) 08/12/2015   BMI 31.0-31.9,adult 05/08/2015   Carotid artery stenosis, symptomatic 01/14/2014   History of CVA (cerebrovascular accident) 12/10/2013   Hyperlipidemia associated with type 2 diabetes mellitus (HCC) 01/26/2013   Essential hypertension, benign 01/26/2013       Review of Systems  Constitutional:  Negative for diaphoresis.  Eyes:  Negative for pain.  Respiratory:  Negative for shortness of breath.   Cardiovascular:  Negative for chest pain, palpitations and leg swelling.  Gastrointestinal:  Negative for abdominal pain.  Endocrine: Negative for polydipsia.  Skin:  Negative for rash.  Neurological:  Negative for dizziness, weakness and headaches.  Hematological:  Does not bruise/bleed easily.  All other systems reviewed and are negative.      Objective:   Physical Exam Constitutional:      Appearance: Normal appearance.  Cardiovascular:     Rate and Rhythm: Normal rate and regular rhythm.     Heart sounds: Normal heart  sounds.  Pulmonary:     Effort: Pulmonary effort is normal.     Breath sounds: Normal breath sounds.  Musculoskeletal:     Right lower leg: Edema (1+) present.     Left lower leg: Edema (1+) present.  Skin:    General: Skin is warm.  Neurological:     General: No focal deficit present.     Mental Status: She is alert and oriented to person, place, and time.  Psychiatric:        Mood and Affect: Mood normal.        Behavior: Behavior normal.    BP 91/65   Pulse 68   Temp 97.6 F (36.4 C) (Temporal)   Ht 5\' 2"  (1.575 m)   Wt 157 lb (71.2 kg)   LMP 01/27/1983   SpO2 96%   BMI 28.72 kg/m         Assessment & Plan:   Kylie Ward in today with chief complaint of Hospitalization Follow-up   1. Acute on chronic congestive heart failure, unspecified heart failure type (HCC) (Primary) Elevate legs when sitting Limit fluid intake Report any SOB  2. Hospital discharge follow-up Reviewed records patient brought from hospital  Orders Placed This Encounter  Procedures   BMP8+EGFR   Brain natriuretic peptide     The above assessment and management plan was discussed with the patient. The patient verbalized understanding of and has agreed to the management plan. Patient is aware to call the clinic if symptoms persist or worsen. Patient is aware when to return to the clinic for a follow-up  visit. Patient educated on when it is appropriate to go to the emergency department.   [name], FNP

## 2024-02-25 LAB — BMP8+EGFR
BUN/Creatinine Ratio: 18 (ref 12–28)
BUN: 36 mg/dL — ABNORMAL HIGH (ref 8–27)
CO2: 22 mmol/L (ref 20–29)
Calcium: 9.2 mg/dL (ref 8.7–10.3)
Chloride: 95 mmol/L — ABNORMAL LOW (ref 96–106)
Creatinine, Ser: 2.04 mg/dL — ABNORMAL HIGH (ref 0.57–1.00)
Glucose: 222 mg/dL — ABNORMAL HIGH (ref 70–99)
Potassium: 5.1 mmol/L (ref 3.5–5.2)
Sodium: 133 mmol/L — ABNORMAL LOW (ref 134–144)
eGFR: 25 mL/min/{1.73_m2} — ABNORMAL LOW (ref >59)

## 2024-02-25 LAB — BRAIN NATRIURETIC PEPTIDE: BNP: 256.7 pg/mL — ABNORMAL HIGH (ref 0.0–100.0)

## 2024-02-28 ENCOUNTER — Inpatient Hospital Stay: Admitting: Nurse Practitioner

## 2024-03-16 ENCOUNTER — Inpatient Hospital Stay (HOSPITAL_COMMUNITY)
Admission: EM | Admit: 2024-03-16 | Discharge: 2024-03-20 | DRG: 291 | Disposition: A | Discharge status: Home / Self Care | Attending: Internal Medicine | Admitting: Internal Medicine

## 2024-03-16 ENCOUNTER — Emergency Department (HOSPITAL_COMMUNITY)

## 2024-03-16 DIAGNOSIS — L03115 Cellulitis of right lower limb: Secondary | ICD-10-CM | POA: Diagnosis present

## 2024-03-16 DIAGNOSIS — E1122 Type 2 diabetes mellitus with diabetic chronic kidney disease: Secondary | ICD-10-CM | POA: Diagnosis not present

## 2024-03-16 DIAGNOSIS — F0394 Unspecified dementia, unspecified severity, with anxiety: Secondary | ICD-10-CM | POA: Diagnosis present

## 2024-03-16 DIAGNOSIS — I708 Atherosclerosis of other arteries: Secondary | ICD-10-CM | POA: Diagnosis present

## 2024-03-16 DIAGNOSIS — Z823 Family history of stroke: Secondary | ICD-10-CM

## 2024-03-16 DIAGNOSIS — Z833 Family history of diabetes mellitus: Secondary | ICD-10-CM | POA: Diagnosis not present

## 2024-03-16 DIAGNOSIS — I2510 Atherosclerotic heart disease of native coronary artery without angina pectoris: Secondary | ICD-10-CM | POA: Diagnosis present

## 2024-03-16 DIAGNOSIS — I130 Hypertensive heart and chronic kidney disease with heart failure and stage 1 through stage 4 chronic kidney disease, or unspecified chronic kidney disease: Principal | ICD-10-CM | POA: Diagnosis present

## 2024-03-16 DIAGNOSIS — I110 Hypertensive heart disease with heart failure: Secondary | ICD-10-CM | POA: Diagnosis not present

## 2024-03-16 DIAGNOSIS — Z87891 Personal history of nicotine dependence: Secondary | ICD-10-CM

## 2024-03-16 DIAGNOSIS — Z8673 Personal history of transient ischemic attack (TIA), and cerebral infarction without residual deficits: Secondary | ICD-10-CM

## 2024-03-16 DIAGNOSIS — E1151 Type 2 diabetes mellitus with diabetic peripheral angiopathy without gangrene: Secondary | ICD-10-CM | POA: Diagnosis present

## 2024-03-16 DIAGNOSIS — F0390 Unspecified dementia without behavioral disturbance: Secondary | ICD-10-CM | POA: Diagnosis not present

## 2024-03-16 DIAGNOSIS — J9601 Acute respiratory failure with hypoxia: Secondary | ICD-10-CM | POA: Diagnosis present

## 2024-03-16 DIAGNOSIS — I447 Left bundle-branch block, unspecified: Secondary | ICD-10-CM | POA: Diagnosis present

## 2024-03-16 DIAGNOSIS — E785 Hyperlipidemia, unspecified: Secondary | ICD-10-CM | POA: Diagnosis present

## 2024-03-16 DIAGNOSIS — I340 Nonrheumatic mitral (valve) insufficiency: Secondary | ICD-10-CM | POA: Diagnosis present

## 2024-03-16 DIAGNOSIS — R0602 Shortness of breath: Secondary | ICD-10-CM | POA: Diagnosis not present

## 2024-03-16 DIAGNOSIS — R918 Other nonspecific abnormal finding of lung field: Secondary | ICD-10-CM | POA: Diagnosis not present

## 2024-03-16 DIAGNOSIS — E1142 Type 2 diabetes mellitus with diabetic polyneuropathy: Secondary | ICD-10-CM | POA: Diagnosis present

## 2024-03-16 DIAGNOSIS — J9600 Acute respiratory failure, unspecified whether with hypoxia or hypercapnia: Secondary | ICD-10-CM

## 2024-03-16 DIAGNOSIS — I252 Old myocardial infarction: Secondary | ICD-10-CM | POA: Diagnosis not present

## 2024-03-16 DIAGNOSIS — Z794 Long term (current) use of insulin: Secondary | ICD-10-CM

## 2024-03-16 DIAGNOSIS — Z7984 Long term (current) use of oral hypoglycemic drugs: Secondary | ICD-10-CM | POA: Diagnosis not present

## 2024-03-16 DIAGNOSIS — I255 Ischemic cardiomyopathy: Secondary | ICD-10-CM | POA: Diagnosis not present

## 2024-03-16 DIAGNOSIS — J90 Pleural effusion, not elsewhere classified: Secondary | ICD-10-CM | POA: Diagnosis not present

## 2024-03-16 DIAGNOSIS — I10 Essential (primary) hypertension: Secondary | ICD-10-CM | POA: Diagnosis not present

## 2024-03-16 DIAGNOSIS — R0603 Acute respiratory distress: Secondary | ICD-10-CM

## 2024-03-16 DIAGNOSIS — N1832 Chronic kidney disease, stage 3b: Secondary | ICD-10-CM | POA: Diagnosis present

## 2024-03-16 DIAGNOSIS — I2489 Other forms of acute ischemic heart disease: Secondary | ICD-10-CM | POA: Diagnosis not present

## 2024-03-16 DIAGNOSIS — Z8249 Family history of ischemic heart disease and other diseases of the circulatory system: Secondary | ICD-10-CM

## 2024-03-16 DIAGNOSIS — I2583 Coronary atherosclerosis due to lipid rich plaque: Secondary | ICD-10-CM | POA: Diagnosis not present

## 2024-03-16 DIAGNOSIS — N179 Acute kidney failure, unspecified: Secondary | ICD-10-CM | POA: Diagnosis present

## 2024-03-16 DIAGNOSIS — I509 Heart failure, unspecified: Principal | ICD-10-CM

## 2024-03-16 DIAGNOSIS — I5023 Acute on chronic systolic (congestive) heart failure: Secondary | ICD-10-CM | POA: Diagnosis present

## 2024-03-16 DIAGNOSIS — Z66 Do not resuscitate: Secondary | ICD-10-CM | POA: Diagnosis present

## 2024-03-16 DIAGNOSIS — Z79899 Other long term (current) drug therapy: Secondary | ICD-10-CM

## 2024-03-16 DIAGNOSIS — I700 Atherosclerosis of aorta: Secondary | ICD-10-CM | POA: Diagnosis not present

## 2024-03-16 DIAGNOSIS — Z604 Social exclusion and rejection: Secondary | ICD-10-CM | POA: Diagnosis present

## 2024-03-16 DIAGNOSIS — Z7902 Long term (current) use of antithrombotics/antiplatelets: Secondary | ICD-10-CM

## 2024-03-16 DIAGNOSIS — Z888 Allergy status to other drugs, medicaments and biological substances status: Secondary | ICD-10-CM

## 2024-03-16 LAB — I-STAT VENOUS BLOOD GAS, ED
Acid-base deficit: 4 mmol/L — ABNORMAL HIGH (ref 0.0–2.0)
Bicarbonate: 20.9 mmol/L (ref 20.0–28.0)
Calcium, Ion: 1.11 mmol/L — ABNORMAL LOW (ref 1.15–1.40)
HCT: 37 % (ref 36.0–46.0)
Hemoglobin: 12.6 g/dL (ref 12.0–15.0)
O2 Saturation: 99 %
Potassium: 4.3 mmol/L (ref 3.5–5.1)
Sodium: 136 mmol/L (ref 135–145)
TCO2: 22 mmol/L (ref 22–32)
pCO2, Ven: 38 mmHg — ABNORMAL LOW (ref 44–60)
pH, Ven: 7.348 (ref 7.25–7.43)
pO2, Ven: 148 mmHg — ABNORMAL HIGH (ref 32–45)

## 2024-03-16 LAB — COMPREHENSIVE METABOLIC PANEL WITH GFR
ALT: 13 U/L (ref 0–44)
AST: 22 U/L (ref 15–41)
Albumin: 3.4 g/dL — ABNORMAL LOW (ref 3.5–5.0)
Alkaline Phosphatase: 76 U/L (ref 38–126)
Anion gap: 14 (ref 5–15)
BUN: 26 mg/dL — ABNORMAL HIGH (ref 8–23)
CO2: 20 mmol/L — ABNORMAL LOW (ref 22–32)
Calcium: 8.9 mg/dL (ref 8.9–10.3)
Chloride: 102 mmol/L (ref 98–111)
Creatinine, Ser: 1.54 mg/dL — ABNORMAL HIGH (ref 0.44–1.00)
GFR, Estimated: 35 mL/min — ABNORMAL LOW (ref >60)
Glucose, Bld: 259 mg/dL — ABNORMAL HIGH (ref 70–99)
Potassium: 4.4 mmol/L (ref 3.5–5.1)
Sodium: 136 mmol/L (ref 135–145)
Total Bilirubin: 0.8 mg/dL (ref 0.0–1.2)
Total Protein: 6.6 g/dL (ref 6.5–8.1)

## 2024-03-16 LAB — CBC WITH DIFFERENTIAL/PLATELET
Abs Immature Granulocytes: 0.06 10*3/uL (ref 0.00–0.07)
Basophils Absolute: 0.1 10*3/uL (ref 0.0–0.1)
Basophils Relative: 0 %
Eosinophils Absolute: 0 10*3/uL (ref 0.0–0.5)
Eosinophils Relative: 0 %
HCT: 39 % (ref 36.0–46.0)
Hemoglobin: 12.7 g/dL (ref 12.0–15.0)
Immature Granulocytes: 0 %
Lymphocytes Relative: 7 %
Lymphs Abs: 1.2 10*3/uL (ref 0.7–4.0)
MCH: 29 pg (ref 26.0–34.0)
MCHC: 32.6 g/dL (ref 30.0–36.0)
MCV: 89 fL (ref 80.0–100.0)
Monocytes Absolute: 0.5 10*3/uL (ref 0.1–1.0)
Monocytes Relative: 3 %
Neutro Abs: 15.2 10*3/uL — ABNORMAL HIGH (ref 1.7–7.7)
Neutrophils Relative %: 90 %
Platelets: 339 10*3/uL (ref 150–400)
RBC: 4.38 MIL/uL (ref 3.87–5.11)
RDW: 13.1 % (ref 11.5–15.5)
WBC: 17 10*3/uL — ABNORMAL HIGH (ref 4.0–10.5)
nRBC: 0 % (ref 0.0–0.2)

## 2024-03-16 LAB — LACTIC ACID, PLASMA
Lactic Acid, Venous: 1.4 mmol/L (ref 0.5–1.9)
Lactic Acid, Venous: 1.3 mmol/L (ref 0.5–1.9)

## 2024-03-16 LAB — TROPONIN I (HIGH SENSITIVITY)
Troponin I (High Sensitivity): 102 ng/L (ref <18)
Troponin I (High Sensitivity): 976 ng/L (ref <18)

## 2024-03-16 LAB — GLUCOSE, CAPILLARY: Glucose-Capillary: 253 mg/dL — ABNORMAL HIGH (ref 70–99)

## 2024-03-16 LAB — BRAIN NATRIURETIC PEPTIDE: B Natriuretic Peptide: 435 pg/mL — ABNORMAL HIGH (ref 0.0–100.0)

## 2024-03-16 MED ORDER — BISACODYL 5 MG PO TBEC: 5.0000 mg | DELAYED_RELEASE_TABLET | Freq: Every day | ORAL | Status: DC | PRN

## 2024-03-16 MED ORDER — LISINOPRIL 20 MG PO TABS
20.0000 mg | ORAL_TABLET | Freq: Every day | ORAL | Status: DC
  Administered 2024-03-17: 20 mg via ORAL
  Filled 2024-03-16: qty 1

## 2024-03-16 MED ORDER — ALPRAZOLAM 0.5 MG PO TABS
1.0000 mg | ORAL_TABLET | Freq: Two times a day (BID) | ORAL | Status: DC
  Administered 2024-03-17 – 2024-03-20 (×6): 1 mg via ORAL
  Filled 2024-03-16 (×7): qty 2

## 2024-03-16 MED ORDER — CEFAZOLIN SODIUM-DEXTROSE 2-4 GM/100ML-% IV SOLN
2.0000 g | Freq: Three times a day (TID) | INTRAVENOUS | Status: DC
  Administered 2024-03-16 – 2024-03-17 (×2): 2 g via INTRAVENOUS
  Filled 2024-03-16 (×2): qty 100

## 2024-03-16 MED ORDER — CLOPIDOGREL BISULFATE 75 MG PO TABS
75.0000 mg | ORAL_TABLET | Freq: Every day | ORAL | Status: DC
  Administered 2024-03-17 – 2024-03-20 (×4): 75 mg via ORAL
  Filled 2024-03-16 (×4): qty 1

## 2024-03-16 MED ORDER — SPIRONOLACTONE 25 MG PO TABS
25.0000 mg | ORAL_TABLET | Freq: Every day | ORAL | Status: DC
  Administered 2024-03-18 – 2024-03-20 (×3): 25 mg via ORAL
  Filled 2024-03-16 (×3): qty 1

## 2024-03-16 MED ORDER — ESCITALOPRAM OXALATE 10 MG PO TABS
20.0000 mg | ORAL_TABLET | Freq: Every day | ORAL | Status: DC
  Administered 2024-03-17 – 2024-03-20 (×4): 20 mg via ORAL
  Filled 2024-03-16 (×4): qty 2

## 2024-03-16 MED ORDER — CLOPIDOGREL BISULFATE 75 MG PO TABS: 75.0000 mg | ORAL_TABLET | Freq: Every day | ORAL | Status: DC

## 2024-03-16 MED ORDER — HYDRALAZINE HCL 20 MG/ML IJ SOLN: 10.0000 mg | Freq: Once | INTRAMUSCULAR | Status: DC

## 2024-03-16 MED ORDER — INSULIN ASPART 100 UNIT/ML IJ SOLN
0.0000 [IU] | Freq: Three times a day (TID) | INTRAMUSCULAR | Status: DC
  Administered 2024-03-17: 1 [IU] via SUBCUTANEOUS
  Administered 2024-03-17 – 2024-03-20 (×3): 2 [IU] via SUBCUTANEOUS

## 2024-03-16 MED ORDER — ATORVASTATIN CALCIUM 40 MG PO TABS
40.0000 mg | ORAL_TABLET | Freq: Every day | ORAL | Status: DC
  Administered 2024-03-17 – 2024-03-20 (×4): 40 mg via ORAL
  Filled 2024-03-16 (×4): qty 1

## 2024-03-16 MED ORDER — ACETAMINOPHEN 325 MG PO TABS
650.0000 mg | ORAL_TABLET | Freq: Four times a day (QID) | ORAL | Status: DC | PRN
  Administered 2024-03-17 – 2024-03-20 (×3): 650 mg via ORAL
  Filled 2024-03-16 (×4): qty 2

## 2024-03-16 MED ORDER — MEMANTINE HCL 10 MG PO TABS
10.0000 mg | ORAL_TABLET | Freq: Two times a day (BID) | ORAL | Status: DC
  Administered 2024-03-16 – 2024-03-20 (×8): 10 mg via ORAL
  Filled 2024-03-16 (×8): qty 1

## 2024-03-16 MED ORDER — DAPAGLIFLOZIN PROPANEDIOL 10 MG PO TABS: 10.0000 mg | ORAL_TABLET | Freq: Every day | ORAL | Status: DC

## 2024-03-16 MED ORDER — SPIRONOLACTONE 12.5 MG HALF TABLET: 25.0000 mg | ORAL_TABLET | Freq: Every day | ORAL | Status: DC

## 2024-03-16 MED ORDER — HEPARIN SODIUM (PORCINE) 5000 UNIT/ML IJ SOLN
5000.0000 [IU] | Freq: Three times a day (TID) | INTRAMUSCULAR | Status: DC
  Administered 2024-03-16 – 2024-03-20 (×11): 5000 [IU] via SUBCUTANEOUS
  Filled 2024-03-16 (×11): qty 1

## 2024-03-16 MED ORDER — FUROSEMIDE 10 MG/ML IJ SOLN: 60.0000 mg | Freq: Once | INTRAMUSCULAR | Status: DC

## 2024-03-16 MED ORDER — FUROSEMIDE 10 MG/ML IJ SOLN
80.0000 mg | Freq: Two times a day (BID) | INTRAMUSCULAR | Status: DC
  Administered 2024-03-16 – 2024-03-17 (×3): 80 mg via INTRAVENOUS
  Filled 2024-03-16 (×3): qty 8

## 2024-03-16 MED ORDER — CEFAZOLIN SODIUM-DEXTROSE 2-4 GM/100ML-% IV SOLN
2.0000 g | Freq: Once | INTRAVENOUS | Status: AC
  Administered 2024-03-16: 2 g via INTRAVENOUS
  Filled 2024-03-16: qty 100

## 2024-03-16 MED ORDER — TRAZODONE HCL 50 MG PO TABS
50.0000 mg | ORAL_TABLET | Freq: Every day | ORAL | Status: DC
  Administered 2024-03-16 – 2024-03-19 (×4): 50 mg via ORAL
  Filled 2024-03-16 (×4): qty 1

## 2024-03-16 MED ORDER — METOPROLOL SUCCINATE ER 25 MG PO TB24
25.0000 mg | ORAL_TABLET | Freq: Every day | ORAL | Status: DC
  Administered 2024-03-17 – 2024-03-20 (×4): 25 mg via ORAL
  Filled 2024-03-16 (×4): qty 1

## 2024-03-16 MED ORDER — ATORVASTATIN CALCIUM 40 MG PO TABS: 40.0000 mg | ORAL_TABLET | Freq: Every day | ORAL | Status: DC

## 2024-03-16 MED ORDER — ONDANSETRON HCL 4 MG PO TABS: 4.0000 mg | ORAL_TABLET | Freq: Four times a day (QID) | ORAL | Status: DC | PRN

## 2024-03-16 MED ORDER — HYDRALAZINE HCL 20 MG/ML IJ SOLN: 10.0000 mg | INTRAMUSCULAR | Status: DC | PRN

## 2024-03-16 MED ORDER — DONEPEZIL HCL 10 MG PO TABS
10.0000 mg | ORAL_TABLET | Freq: Every day | ORAL | Status: DC
Start: 2024-03-16 — End: 2024-03-20
  Administered 2024-03-16 – 2024-03-19 (×4): 10 mg via ORAL
  Filled 2024-03-16 (×4): qty 1

## 2024-03-16 MED ORDER — INSULIN GLARGINE-YFGN 100 UNIT/ML ~~LOC~~ SOLN
40.0000 [IU] | Freq: Every day | SUBCUTANEOUS | Status: DC
  Administered 2024-03-17: 40 [IU] via SUBCUTANEOUS
  Filled 2024-03-16: qty 0.4

## 2024-03-16 MED ORDER — ACETAMINOPHEN 650 MG RE SUPP: 650.0000 mg | Freq: Four times a day (QID) | RECTAL | Status: DC | PRN

## 2024-03-16 MED ORDER — ONDANSETRON HCL 4 MG/2ML IJ SOLN: 4.0000 mg | Freq: Four times a day (QID) | INTRAMUSCULAR | Status: DC | PRN

## 2024-03-16 MED ORDER — FUROSEMIDE 10 MG/ML IJ SOLN
20.0000 mg | Freq: Once | INTRAMUSCULAR | Status: AC
  Administered 2024-03-16: 20 mg via INTRAVENOUS
  Filled 2024-03-16: qty 2

## 2024-03-16 MED ORDER — FUROSEMIDE 10 MG/ML IJ SOLN: 40.0000 mg | Freq: Once | INTRAMUSCULAR | Status: DC

## 2024-03-16 NOTE — Consult Note (Addendum)
 Advanced Heart Failure Team Consult Note   Primary Physician: Delfina Feller, FNP Cardiologist:  None  Reason for Consultation: Acute on chronic HFrEF  HPI:    Kylie Ward is seen today for evaluation of acute on chronic HFrEF at the request of Dr. Florie Husband with Emergency Medicine. 78 y.o. female with history of CAD s/p NSTEMI 06/24 w/ 3V disease on cath (not a candidate for CABG d/t comorbidities, HFrEF, LBBB, dementia, carotid stenosis s/p left ICA stenting 2011 and R CEA 2015, claudication with abnormal ABIs.  EF previously as low as 20-25% in   EF improved to 40-45% in 10/24.   Last saw Dr. Alease Amend in 03/25. She was mildly volume up and started on Farxiga . Plan was to consider CRT-P if had worsening LV dysfunction in future in setting of LBBB.   Presented to the ED via EMS this afternoon with complaints of shortness of breath and chest pain X 2 days. Placed on BiPAP en route d/t respiratory distress. CXR with pulmonary edema and ? Multiple pulmonary nodules. She was given 20 mg lasix  IV and started on ancef for possible lower extremity cellulitis. Initially thought to be hypotensive; however, blood pressures had been checked in L arm where she has severe subclavian stenosis. SBP > 170 on initial check in R arm.  Her husband and daughter are at bedside. Patient is frail at baseline and requires assistance with most ADLs.   Home Medications Prior to Admission medications   Medication Sig Start Date End Date Taking? Authorizing Provider  Accu-Chek FastClix Lancets MISC CHECK BLOOD SUGAR UP TO 3 TIMES A DAY AS DIRECTED Dx E11.42 02/15/22   Delfina Feller, FNP  ALPRAZolam  (XANAX ) 1 MG tablet Take 1 tablet (1 mg total) by mouth 2 (two) times daily. 01/19/24   Gaylyn Keas, Mary-Margaret, FNP  atorvastatin  (LIPITOR) 40 MG tablet Take 1 tablet (40 mg total) by mouth daily. 01/19/24   Delfina Feller, FNP  ciprofloxacin  (CIPRO ) 250 MG tablet SMARTSIG:1 Tablet(s) By Mouth  Every 12 Hours 02/21/24   [provider]  clopidogrel  (PLAVIX ) 75 MG tablet Take 1 tablet (75 mg total) by mouth daily. 01/19/24   Gaylyn Keas Mary-Margaret, FNP  dapagliflozin  propanediol (FARXIGA ) 10 MG TABS tablet Take 1 tablet (10 mg total) by mouth daily before breakfast. 01/19/24   Gaylyn Keas, Mary-Margaret, FNP  donepezil  (ARICEPT ) 10 MG tablet Take 1 tablet (10 mg total) by mouth at bedtime. 01/19/24   Gaylyn Keas, Mary-Margaret, FNP  escitalopram  (LEXAPRO ) 20 MG tablet Take 1 tablet (20 mg total) by mouth daily. 01/19/24   Gaylyn Keas Mary-Margaret, FNP  fenofibrate  160 MG tablet Take 1 tablet (160 mg total) by mouth daily. 01/19/24   Gaylyn Keas Mary-Margaret, FNP  furosemide  (LASIX ) 40 MG tablet Take 1 tablet (40 mg total) by mouth daily. 01/19/24   Gaylyn Keas, Mary-Margaret, FNP  glucose blood (ACCU-CHEK SMARTVIEW) test strip CHECK BLOOD SUGAR UP TO 3 TIMES A DAY AS DIRECTED 07/10/21   Delfina Feller, FNP  insulin  aspart (NOVOLOG ) 100 UNIT/ML injection Inject 0-10 Units into the skin See admin instructions. 0-10 units per sliding scale, three times daily before meals. 01/19/24   Gaylyn Keas Mary-Margaret, FNP  insulin  glargine (LANTUS ) 100 UNIT/ML injection Inject 0.4 mLs (40 Units total) into the skin daily. 01/19/24   Gaylyn Keas Mary-Margaret, FNP  Insulin  Pen Needle (SURE COMFORT PEN NEEDLES) 31G X 8 MM MISC USE TO inject lantus  ONCE daily 12/04/19   Delfina Feller, FNP  memantine  (NAMENDA ) 10 MG tablet Take 1 tablet (10 mg total) by  mouth 2 (two) times daily. 01/19/24   Delfina Feller, FNP  Menthol-Zinc Oxide (CALMOSEPTINE) 0.44-20.6 % OINT Apply heavily to affected area 08/05/23   Delfina Feller, FNP  metFORMIN  (GLUCOPHAGE -XR) 750 MG 24 hr tablet Take 1 tablet (750 mg total) by mouth daily with breakfast. 01/19/24   Delfina Feller, FNP  metoprolol  succinate (TOPROL -XL) 25 MG 24 hr tablet Take 1 tablet (25 mg total) by mouth daily. 10/21/23   Delfina Feller, FNP  Multiple  Vitamins-Minerals (ONE-A-DAY WOMENS 50+) TABS Take 1 tablet by mouth daily.    [provider]  polyethylene glycol powder (GLYCOLAX /MIRALAX ) 17 GM/SCOOP powder Take 17 g by mouth daily. 06/15/23   Galvin Jules, FNP  spironolactone  (ALDACTONE ) 25 MG tablet Take 1 tablet (25 mg total) by mouth daily. 11/04/23 02/02/24  Gerald Kitty., NP  traZODone  (DESYREL ) 50 MG tablet Take 1 tablet (50 mg total) by mouth at bedtime. 01/19/24   Delfina Feller, FNP    Past Medical History: Past Medical History:  Diagnosis Date   Anxiety    Diabetes mellitus without complication (HCC)    Hyperlipidemia    Hypertension    Stroke [facility])     Past Surgical History: Past Surgical History:  Procedure Laterality Date   CAROTID ENDARTERECTOMY Right    CAROTID STENT     LEFT HEART CATH AND CORONARY ANGIOGRAPHY N/A 04/07/2023   Procedure: LEFT HEART CATH AND CORONARY ANGIOGRAPHY;  Surgeon: Arnoldo Lapping, MD;  Location: [facility] INVASIVE CV LAB;  Service: Cardiovascular;  Laterality: N/A;    Family History: Family History  Problem Relation Age of Onset   Heart disease Father    Heart attack Father    Hypertension Father    Diabetes Sister    Stroke Sister    Diabetes Brother    Diabetes Sister    Diabetes Sister    Diabetes Brother    Diabetes Brother    Diabetes Brother     Social History: Social History   Socioeconomic History   Marital status: Married    Spouse name: Animal nutritionist   Number of children: 1   Years of education: 10   Highest education level: 10th grade  Occupational History   Occupation: retired    Associate Professor: [employer]  Tobacco Use   Smoking status: Former    Current packs/day: 0.00    Types: Cigarettes    Quit date: 12/02/2009    Years since quitting: 14.2   Smokeless tobacco: Never  Vaping Use   Vaping status: Never Used  Substance and Sexual Activity   Alcohol use: No   Drug use: No   Sexual activity: Yes    Birth control/protection: Post-menopausal  Other  Topics Concern   Not on file  Social History Narrative   One level living with her husband   Social Drivers of Corporate investment banker Strain: Low Risk  (05/10/2022)   Overall Financial Resource Strain (CARDIA)    Difficulty of Paying Living Expenses: Not hard at all  Food Insecurity: No Food Insecurity (04/12/2023)   Hunger Vital Sign    Worried About Running Out of Food in the Last Year: Never true    Ran Out of Food in the Last Year: Never true  Transportation Needs: No Transportation Needs (04/12/2023)   PRAPARE - Administrator, Civil Service (Medical): No    Lack of Transportation (Non-Medical): No  Physical Activity: Inactive (05/10/2022)   Exercise Vital Sign    Days of Exercise per Week:  0 days    Minutes of Exercise per Session: 0 min  Stress: No Stress Concern Present (05/10/2022)   [employer] of Occupational Health - Occupational Stress Questionnaire    Feeling of Stress : Only a little  Social Connections: Socially Isolated (05/07/2021)   Social Connection and Isolation Panel [NHANES]    Frequency of Communication with Friends and Family: Once a week    Frequency of Social Gatherings with Friends and Family: Once a week    Attends Religious Services: Never    Database administrator or Organizations: No    Attends Banker Meetings: Never    Marital Status: Married    Allergies:  Allergies  Allergen Reactions   Crestor  [Rosuvastatin ] Other (See Comments)    Myalgias and arthralgias to the point she had difficulty walking. She was tolerant of lipitor from the family's recall of events.    Niaspan [Niacin Er (Antihyperlipidemic)] Itching    Skin burning    Objective:    Vital Signs:   Temp:  [97.5 F (36.4 C)-97.9 F (36.6 C)] 97.5 F (36.4 C) (05/16 1422) Pulse Rate:  [99-105] 105 (05/16 1422) Resp:  [25] 25 (05/16 1402) BP: (88-160)/(64-65) 88/64 (05/16 1422) SpO2:  [95 %-96 %] 96 % (05/16 1422) FiO2 (%):  [60 %] 60 %  (05/16 1442)    Weight change: There were no vitals filed for this visit.  Intake/Output:  No intake or output data in the 24 hours ending 03/16/24 1519    Physical Exam    General:  Ill appearing Neck: JVP to jaw Cor: Regular rate & rhythm, tachy. No rubs, gallops or murmurs. Lungs: moderate respiratory distress on BiPAP Abdomen: obese, soft, nontender, nondistended.  Extremities: 2-3+ edema, erythema to right anterior shin with multiple small wounds in various stages of healing Neuro: Lethargic but wakes up and speaks in short sentances   Telemetry   Sinus tach 100s  Labs   Basic Metabolic Panel: Recent Labs  Lab 03/16/24 1457  NA 136  K 4.3    Liver Function Tests: No results for input(s): "AST", "ALT", "ALKPHOS", "BILITOT", "PROT", "ALBUMIN" in the last 168 hours. No results for input(s): "LIPASE", "AMYLASE" in the last 168 hours. No results for input(s): "AMMONIA" in the last 168 hours.  CBC: Recent Labs  Lab 03/16/24 1441 03/16/24 1457  WBC 17.0*  --   NEUTROABS 15.2*  --   HGB 12.7 12.6  HCT 39.0 37.0  MCV 89.0  --   PLT 339  --     Cardiac Enzymes: No results for input(s): "CKTOTAL", "CKMB", "CKMBINDEX", "TROPONINI" in the last 168 hours.  BNP: BNP (last 3 results) Recent Labs    04/07/23 0838 02/24/24 1456  BNP 870.1* 256.7*    ProBNP (last 3 results) No results for input(s): "PROBNP" in the last 8760 hours.   CBG: No results for input(s): "GLUCAP" in the last 168 hours.  Coagulation Studies: No results for input(s): "LABPROT", "INR" in the last 72 hours.   Imaging   DG Chest Port 1 View Result Date: 03/16/2024 CLINICAL DATA:  Shortness of breath. EXAM: PORTABLE CHEST 1 VIEW COMPARISON:  April 07, 2023. FINDINGS: Stable cardiomediastinal silhouette. Interstitial densities are noted throughout both lungs concerning for pulmonary edema. Also noted are multiple possible bilateral pulmonary nodules. Bony thorax is unremarkable.  IMPRESSION: Bilateral interstitial densities are noted concerning for pulmonary edema. Multiple possible pulmonary nodules are also noted concerning for metastatic disease. CT scan of the chest is recommended  for further evaluation. Electronically Signed   By: Rosalene Colon M.D.   On: 03/16/2024 14:32     Medications:     Current Medications:   Infusions:   ceFAZolin (ANCEF) IV        Patient Profile   78 y.o. female with history of chronic HFrEF,/ICM, CAD not a candidate for CABG, LBB, dementia, carotid stenosis s/p left ICA stenting 2011 and R CEA 2015, PAD.  Presenting with acute respiratory failure requiring BiPAP in setting of acute on chronic HFrEF  Assessment/Plan   Acute on chronic HFrEF -ICM. Also with known LBBB. QRS ~ 150 ms. -EF as low as 20-25% in 10/24. Improved to 40-45% in 10/24 -Bedside echo today with EF down to 25-30% -Will obtain complete echo -Lactic acid pending -NYHA IV -Significantly volume overloaded on exam. Start IV lasix  80 BID -Hypertensive. Give 10 mg IV hydralazine . If SBP remains significantly elevated may need nitroglycerin drip.  -Restart GDMT when stable enough for PO meds  2. Acute respiratory failure -2/2 Acute on chronic CHF -Currently on BiPAP. Need to diurese aggressively over the next couple of hours and lower BP. If able to come off BiPAP can go to progressive under TRH, otherwise will need ICU level care.  3. CAD -MV CAD including LM disease on cardiac cath 06/24 -Not a candidate for CABG -Has been on plavix  and statin. Resume when comes off BiPAP  4. Carotid artery disease PAD -Hx left ICA stenting and R CEA -ABIs 3/25: R ABI 0.5 and L ABI 0.4  5. CKD IIIb - Cr 1.54 today - Watch with diuresis - Eventually restart SGLT2i  6. Possible pulmonary nodules - Noted on Chest X-Ray - Defer workup to primary team  7. Possible RLE cellulitis - In setting of significant PAD - Started on ancef - Blood cultures  drawn   Length of Stay: 0  FINCH, LINDSAY N, PA-C  03/16/2024, 3:19 PM    Advanced Heart Failure Team Pager [phone] (M-F; 7a - 5p)  Please contact [facility] for night-coverage after hours (4p -7a ) and weekends on [email or web address]   Patient seen and examined with the above-signed Advanced Practice Provider and/or Housestaff. I personally reviewed laboratory data, imaging studies and relevant notes. I independently examined the patient and formulated the important aspects of the plan. I have edited the note to reflect any of my changes or salient points. I have personally discussed the plan with the patient and/or family.  78 y/o woman with multiple medical problems including obesity, CKD 3b multivessel CAD, systolic HF due to iCM  Very limited functional capacity at baseline. Nearly bedbound.  Came to ER with worsening HF symptoms and hypoxic respiratory failure   CXR with diffuse pulmonary edema and multiple pulmonary nodules concerning for possible metastatic CA  BP high .  Started on Bipap  I did bedside echo EF 25-30% with akinesis of inferior and inferolateral walls  General:  Ill-appearing. On bipap HEENT: normal Neck: supple. JVP to ear  Cor: Reg tachy  Lungs: diffuse crackles Abdomen: obese soft, nontender, nondistended. No hepatosplenomegaly. No bruits or masses. Good bowel sounds. Extremities: no cyanosis, clubbing, rash, 2-3+ edema mild cellulitis Neuro: alert & orientedx3, cranial nerves grossly intact. moves all 4 extremities w/o difficulty. Affect pleasant  Patient with progressive HF with marked volume overload in setting of iCM. EF 25-30% on my bedside echo. Now requiring BIPAP  D/w EDP   Will continue BIPAP give 80 IV lasix  and hydralazine . If respiratory status  improving can go to floor/SDU. If getting worse will need ICU  HF team will follow.   Give poor baseline functional status may need Palliative Care involvement to help establish goals of care.    Jules Oar, MD  7:42 PM

## 2024-03-16 NOTE — ED Notes (Signed)
 Called 6E nurse regarding patient's transfer to the floor. 6E stated to wait a few minutes so accepting nurse can get report.

## 2024-03-16 NOTE — ED Notes (Signed)
Unable to get blood work °

## 2024-03-16 NOTE — ED Notes (Addendum)
 This paramedic attempted x2 for second cultures and phlebotomist attempted x2. Pt noted to have poor venous access.

## 2024-03-16 NOTE — ED Notes (Signed)
 Attempted x3 for second set of cultures. Poor venous access.

## 2024-03-16 NOTE — ED Triage Notes (Signed)
 Pt BIB [facility] d/t husband calling out for SOB & CP, pt does have dementia & cannot report specific complaints. EMS reports she was on BIPAP while en route & her O2 was 86% on RA. Diminished uppers, BLE edema, Hx of anxiety, CHF. EMS reports Lt BBB, family unaware Hx of that. Rt arm BP was in the 130's/140's SBP & the Lt arm was 80's/90's. CBG 211, 20g Lt AC PIV.

## 2024-03-16 NOTE — H&P (Signed)
 History and Physical    Patient: Kylie Ward ZOX:[id number] DOB: 02/28/1946 DOA: 03/16/2024 DOS: the patient was seen and examined on 03/16/2024 PCP: Delfina Feller, FNP  Patient coming from: Home  Chief Complaint:  Chief Complaint  Patient presents with   Shortness of Breath   HPI: Kylie Ward is a 78 y.o. female with medical history significant for dementia, severe multivessel coronary artery disease not a candidate for CABG because of her dementia, ischemic cardiomyopathy with EF of 25%, known peripheral arterial disease, and recurrent lower extremity cellulitis.  Her husband reports that she was in her usual state of health yesterday but this morning started complaining of shortness of breath when she first woke up.  She complained of chest pain also and received a nitroglycerin at home which did help initially.  Later however she had more chest pain more trouble breathing then she had an episode where she just started vomiting profusely that is when her husband called EMS.  The patient was in distress when EMS arrived and they put her on BiPAP on the way to the hospital. In the ER she was evaluated and felt to be volume overloaded.  Cardiology was consulted. They continued BiPAP and started to diurese her.  The patient's respiratory distress and agitation improved.  She will be admitted to the hospitalist service still on BiPAP with a diagnosis of CHF exacerbation.   Review of Systems: unable to review all systems due to the inability of the patient to answer questions. Past Medical History:  Diagnosis Date   Anxiety    Diabetes mellitus without complication (HCC)    Hyperlipidemia    Hypertension    Stroke [facility])    Past Surgical History:  Procedure Laterality Date   CAROTID ENDARTERECTOMY Right    CAROTID STENT     LEFT HEART CATH AND CORONARY ANGIOGRAPHY N/A 04/07/2023   Procedure: LEFT HEART CATH AND CORONARY ANGIOGRAPHY;  Surgeon: Arnoldo Lapping, MD;   Location: [facility] INVASIVE CV LAB;  Service: Cardiovascular;  Laterality: N/A;   Social History:  reports that she quit smoking about 14 years ago. Her smoking use included cigarettes. She has never used smokeless tobacco. She reports that she does not drink alcohol and does not use drugs.  Allergies  Allergen Reactions   Crestor  [Rosuvastatin ] Other (See Comments)    Myalgias and arthralgias to the point she had difficulty walking. She was tolerant of lipitor from the family's recall of events.    Niaspan [Niacin Er (Antihyperlipidemic)] Itching    Skin burning    Family History  Problem Relation Age of Onset   Heart disease Father    Heart attack Father    Hypertension Father    Diabetes Sister    Stroke Sister    Diabetes Brother    Diabetes Sister    Diabetes Sister    Diabetes Brother    Diabetes Brother    Diabetes Brother     Prior to Admission medications   Medication Sig Start Date End Date Taking? Authorizing Provider  lisinopril  (ZESTRIL ) 20 MG tablet Take 20 mg by mouth daily. 02/08/24  Yes [provider]  Accu-Chek FastClix Lancets MISC CHECK BLOOD SUGAR UP TO 3 TIMES A DAY AS DIRECTED Dx E11.42 02/15/22   Delfina Feller, FNP  ALPRAZolam  (XANAX ) 1 MG tablet Take 1 tablet (1 mg total) by mouth 2 (two) times daily. 01/19/24   Gaylyn Keas, Mary-Margaret, FNP  atorvastatin  (LIPITOR) 40 MG tablet Take 1 tablet (40 mg total) by  mouth daily. 01/19/24   Delfina Feller, FNP  ciprofloxacin  (CIPRO ) 250 MG tablet SMARTSIG:1 Tablet(s) By Mouth Every 12 Hours 02/21/24   [provider]  clopidogrel  (PLAVIX ) 75 MG tablet Take 1 tablet (75 mg total) by mouth daily. 01/19/24   Delfina Feller, FNP  dapagliflozin  propanediol (FARXIGA ) 10 MG TABS tablet Take 1 tablet (10 mg total) by mouth daily before breakfast. 01/19/24   Gaylyn Keas, Mary-Margaret, FNP  donepezil  (ARICEPT ) 10 MG tablet Take 1 tablet (10 mg total) by mouth at bedtime. 01/19/24   Gaylyn Keas, Mary-Margaret,  FNP  escitalopram  (LEXAPRO ) 20 MG tablet Take 1 tablet (20 mg total) by mouth daily. 01/19/24   Gaylyn Keas Mary-Margaret, FNP  fenofibrate  160 MG tablet Take 1 tablet (160 mg total) by mouth daily. 01/19/24   Gaylyn Keas Mary-Margaret, FNP  furosemide  (LASIX ) 40 MG tablet Take 1 tablet (40 mg total) by mouth daily. 01/19/24   Gaylyn Keas, Mary-Margaret, FNP  glucose blood (ACCU-CHEK SMARTVIEW) test strip CHECK BLOOD SUGAR UP TO 3 TIMES A DAY AS DIRECTED 07/10/21   Delfina Feller, FNP  insulin  aspart (NOVOLOG ) 100 UNIT/ML injection Inject 0-10 Units into the skin See admin instructions. 0-10 units per sliding scale, three times daily before meals. 01/19/24   Gaylyn Keas Mary-Margaret, FNP  insulin  glargine (LANTUS ) 100 UNIT/ML injection Inject 0.4 mLs (40 Units total) into the skin daily. 01/19/24   Gaylyn Keas Mary-Margaret, FNP  Insulin  Pen Needle (SURE COMFORT PEN NEEDLES) 31G X 8 MM MISC USE TO inject lantus  ONCE daily 12/04/19   Delfina Feller, FNP  memantine  (NAMENDA ) 10 MG tablet Take 1 tablet (10 mg total) by mouth 2 (two) times daily. 01/19/24   Delfina Feller, FNP  Menthol-Zinc Oxide (CALMOSEPTINE) 0.44-20.6 % OINT Apply heavily to affected area 08/05/23   Delfina Feller, FNP  metFORMIN  (GLUCOPHAGE -XR) 750 MG 24 hr tablet Take 1 tablet (750 mg total) by mouth daily with breakfast. 01/19/24   Delfina Feller, FNP  metoprolol  succinate (TOPROL -XL) 25 MG 24 hr tablet Take 1 tablet (25 mg total) by mouth daily. 10/21/23   Delfina Feller, FNP  Multiple Vitamins-Minerals (ONE-A-DAY WOMENS 50+) TABS Take 1 tablet by mouth daily.    [provider]  polyethylene glycol powder (GLYCOLAX /MIRALAX ) 17 GM/SCOOP powder Take 17 g by mouth daily. 06/15/23   Galvin Jules, FNP  spironolactone  (ALDACTONE ) 25 MG tablet Take 1 tablet (25 mg total) by mouth daily. 11/04/23 02/02/24  Gerald Kitty., NP  traZODone  (DESYREL ) 50 MG tablet Take 1 tablet (50 mg total) by mouth at bedtime. 01/19/24    Delfina Feller, FNP    Physical Exam: Vitals:   03/16/24 1402 03/16/24 1422 03/16/24 1737  BP: (!) 160/65 (!) 88/64 (!) 159/78  Pulse: 99 (!) 105 93  Resp: (!) 25  20  Temp: 97.9 F (36.6 C) (!) 97.5 F (36.4 C)   TempSrc: Oral    SpO2: 95% 96% 100%   Physical Exam:  General: No acute distress, comfortable on bipap HEENT: Normocephalic, atraumatic, PERRL Cardiovascular: Normal rate and rhythm. DP pulses not palpable Pulmonary: Normal pulmonary effort, sl coarse bs in bases Gastrointestinal: Nondistended abdomen, soft, non-tender, normoactive bowel sounds Musculoskeletal:Normal ROM, no lower ext edema Right leg more swollen than left Skin: Skin is warm and dry. Neuro: No focal deficits noted PSYCH: Attentive and cooperative  Data Reviewed:  Results for orders placed or performed during the hospital encounter of 03/16/24 (from the past 24 hours)  CBC with Differential/Platelet     Status: Abnormal   Collection Time: 03/16/24  2:41 PM  Result Value Ref Range   WBC 17.0 (H) 4.0 - 10.5 K/uL   RBC 4.38 3.87 - 5.11 MIL/uL   Hemoglobin 12.7 12.0 - 15.0 g/dL   HCT 56.2 13.0 - 86.5 %   MCV 89.0 80.0 - 100.0 fL   MCH 29.0 26.0 - 34.0 pg   MCHC 32.6 30.0 - 36.0 g/dL   RDW 78.4 69.6 - 29.5 %   Platelets 339 150 - 400 K/uL   nRBC 0.0 0.0 - 0.2 %   Neutrophils Relative % 90 %   Neutro Abs 15.2 (H) 1.7 - 7.7 K/uL   Lymphocytes Relative 7 %   Lymphs Abs 1.2 0.7 - 4.0 K/uL   Monocytes Relative 3 %   Monocytes Absolute 0.5 0.1 - 1.0 K/uL   Eosinophils Relative 0 %   Eosinophils Absolute 0.0 0.0 - 0.5 K/uL   Basophils Relative 0 %   Basophils Absolute 0.1 0.0 - 0.1 K/uL   Immature Granulocytes 0 %   Abs Immature Granulocytes 0.06 0.00 - 0.07 K/uL  Comprehensive metabolic panel with GFR     Status: Abnormal   Collection Time: 03/16/24  2:41 PM  Result Value Ref Range   Sodium 136 135 - 145 mmol/L   Potassium 4.4 3.5 - 5.1 mmol/L   Chloride 102 98 - 111 mmol/L   CO2 20  (L) 22 - 32 mmol/L   Glucose, Bld 259 (H) 70 - 99 mg/dL   BUN 26 (H) 8 - 23 mg/dL   Creatinine, Ser 2.84 (H) 0.44 - 1.00 mg/dL   Calcium  8.9 8.9 - 10.3 mg/dL   Total Protein 6.6 6.5 - 8.1 g/dL   Albumin 3.4 (L) 3.5 - 5.0 g/dL   AST 22 15 - 41 U/L   ALT 13 0 - 44 U/L   Alkaline Phosphatase 76 38 - 126 U/L   Total Bilirubin 0.8 0.0 - 1.2 mg/dL   GFR, Estimated 35 (L) >60 mL/min   Anion gap 14 5 - 15  Troponin I (High Sensitivity)     Status: Abnormal   Collection Time: 03/16/24  2:41 PM  Result Value Ref Range   Troponin I (High Sensitivity) 102 (HH) <18 ng/L  Brain natriuretic peptide     Status: Abnormal   Collection Time: 03/16/24  2:43 PM  Result Value Ref Range   B Natriuretic Peptide 435.0 (H) 0.0 - 100.0 pg/mL  I-Stat venous blood gas, ED     Status: Abnormal   Collection Time: 03/16/24  2:57 PM  Result Value Ref Range   pH, Ven 7.348 7.25 - 7.43   pCO2, Ven 38.0 (L) 44 - 60 mmHg   pO2, Ven 148 (H) 32 - 45 mmHg   Bicarbonate 20.9 20.0 - 28.0 mmol/L   TCO2 22 22 - 32 mmol/L   O2 Saturation 99 %   Acid-base deficit 4.0 (H) 0.0 - 2.0 mmol/L   Sodium 136 135 - 145 mmol/L   Potassium 4.3 3.5 - 5.1 mmol/L   Calcium , Ion 1.11 (L) 1.15 - 1.40 mmol/L   HCT 37.0 36.0 - 46.0 %   Hemoglobin 12.6 12.0 - 15.0 g/dL   Sample type VENOUS   Lactic acid, plasma     Status: None   Collection Time: 03/16/24  3:33 PM  Result Value Ref Range   Lactic Acid, Venous 1.4 0.5 - 1.9 mmol/L  Lactic acid, plasma     Status: None   Collection Time: 03/16/24  5:22 PM  Result Value  Ref Range   Lactic Acid, Venous 1.3 0.5 - 1.9 mmol/L  Troponin I (High Sensitivity)     Status: Abnormal   Collection Time: 03/16/24  5:22 PM  Result Value Ref Range   Troponin I (High Sensitivity) 976 (HH) <18 ng/L     Assessment and Plan: Acute systolic CHF exacerbation with respiratory distress now on Bipap. - Appreciate cardiology input - Lasix  80 mg IV twice daily ordered - cycle cardiac enzymes  - wean  bipap as tolerated - she had hypotension on ace inhibitors  2. Recurrent Cellulitis, known PAD -  - Ancef ordered per protocol  3. Dementia - Monitor for delirium.  4. DMT2 - corrective dose insulin . Resume baseline meds once able to eat off bipap.    Advance Care Planning:   Code Status: Prior  The patient does not have a living well so her husband is her decision maker at this point.  He does not think life support or prolonged severe debilitation is in her best interest.  He chooses DNR/DNI.  The patient's daughter was present for the discussion.   Consults: cardiology  Family Communication: husband and daughter  Severity of Illness: The appropriate patient status for this patient is INPATIENT. Inpatient status is judged to be reasonable and necessary in order to provide the required intensity of service to ensure the patient's safety. The patient's presenting symptoms, physical exam findings, and initial radiographic and laboratory data in the context of their chronic comorbidities is felt to place them at high risk for further clinical deterioration. Furthermore, it is not anticipated that the patient will be medically stable for discharge from the hospital within 2 midnights of admission.   * I certify that at the point of admission it is my clinical judgment that the patient will require inpatient hospital care spanning beyond 2 midnights from the point of admission due to high intensity of service, high risk for further deterioration and high frequency of surveillance required.*  Author: Willadean Hark, MD 03/16/2024 7:03 PM  For on call review [email or web address].

## 2024-03-16 NOTE — ED Provider Notes (Addendum)
 [place] EMERGENCY DEPARTMENT AT [facility] Provider Note  CSN: [id number] Arrival date & time: 03/16/24 1353  Chief Complaint(s) Shortness of Breath  HPI Kylie Ward is a 78 y.o. female with history of dementia, triple-vessel disease, heart failure who is here today for chest pain and shortness of breath.  She is here today with family at bedside to help provide history.  Family reports that beginning today, patient began to complain of chest pain and shortness of breath.  They brought her to the ED for further treatment.   Past Medical History Past Medical History:  Diagnosis Date   Anxiety    Diabetes mellitus without complication (HCC)    Hyperlipidemia    Hypertension    Stroke [facility])    Patient Active Problem List   Diagnosis Date Noted   Primary insomnia 05/03/2023   Chronic midline low back pain without sciatica 05/03/2023   Coronary artery disease involving native coronary artery of native heart with unstable angina pectoris (HCC) 04/08/2023   Dementia without behavioral disturbance (HCC) 04/07/2023   Drug-induced myopathy 10/30/2020   Type 2 diabetes mellitus with diabetic polyneuropathy, with long-term current use of insulin  (HCC) 03/06/2018   Atherosclerosis of aorta (HCC) 09/04/2016   Peripheral edema 11/13/2015   GAD (generalized anxiety disorder) 08/12/2015   BMI 31.0-31.9,adult 05/08/2015   Carotid artery stenosis, symptomatic 01/14/2014   History of CVA (cerebrovascular accident) 12/10/2013   Hyperlipidemia associated with type 2 diabetes mellitus (HCC) 01/26/2013   Essential hypertension, benign 01/26/2013   Home Medication(s) Prior to Admission medications   Medication Sig Start Date End Date Taking? Authorizing Provider  Accu-Chek FastClix Lancets MISC CHECK BLOOD SUGAR UP TO 3 TIMES A DAY AS DIRECTED Dx E11.42 02/15/22   Gaylyn Keas, Mary-Margaret, FNP  ALPRAZolam  (XANAX ) 1 MG tablet Take 1 tablet (1 mg total) by mouth 2 (two) times daily.  01/19/24   Gaylyn Keas, Mary-Margaret, FNP  atorvastatin  (LIPITOR) 40 MG tablet Take 1 tablet (40 mg total) by mouth daily. 01/19/24   Delfina Feller, FNP  ciprofloxacin  (CIPRO ) 250 MG tablet SMARTSIG:1 Tablet(s) By Mouth Every 12 Hours 02/21/24   [provider]  clopidogrel  (PLAVIX ) 75 MG tablet Take 1 tablet (75 mg total) by mouth daily. 01/19/24   Delfina Feller, FNP  dapagliflozin  propanediol (FARXIGA ) 10 MG TABS tablet Take 1 tablet (10 mg total) by mouth daily before breakfast. 01/19/24   Gaylyn Keas, Mary-Margaret, FNP  donepezil  (ARICEPT ) 10 MG tablet Take 1 tablet (10 mg total) by mouth at bedtime. 01/19/24   Gaylyn Keas Mary-Margaret, FNP  escitalopram  (LEXAPRO ) 20 MG tablet Take 1 tablet (20 mg total) by mouth daily. 01/19/24   Gaylyn Keas Mary-Margaret, FNP  fenofibrate  160 MG tablet Take 1 tablet (160 mg total) by mouth daily. 01/19/24   Gaylyn Keas Mary-Margaret, FNP  furosemide  (LASIX ) 40 MG tablet Take 1 tablet (40 mg total) by mouth daily. 01/19/24   Gaylyn Keas, Mary-Margaret, FNP  glucose blood (ACCU-CHEK SMARTVIEW) test strip CHECK BLOOD SUGAR UP TO 3 TIMES A DAY AS DIRECTED 07/10/21   Delfina Feller, FNP  insulin  aspart (NOVOLOG ) 100 UNIT/ML injection Inject 0-10 Units into the skin See admin instructions. 0-10 units per sliding scale, three times daily before meals. 01/19/24   Gaylyn Keas Mary-Margaret, FNP  insulin  glargine (LANTUS ) 100 UNIT/ML injection Inject 0.4 mLs (40 Units total) into the skin daily. 01/19/24   Delfina Feller, FNP  Insulin  Pen Needle (SURE COMFORT PEN NEEDLES) 31G X 8 MM MISC USE TO inject lantus  ONCE daily 12/04/19   Gaylyn Keas,  Mary-Margaret, FNP  memantine  (NAMENDA ) 10 MG tablet Take 1 tablet (10 mg total) by mouth 2 (two) times daily. 01/19/24   Delfina Feller, FNP  Menthol-Zinc Oxide (CALMOSEPTINE) 0.44-20.6 % OINT Apply heavily to affected area 08/05/23   Delfina Feller, FNP  metFORMIN  (GLUCOPHAGE -XR) 750 MG 24 hr tablet Take 1 tablet (750 mg total)  by mouth daily with breakfast. 01/19/24   Delfina Feller, FNP  metoprolol  succinate (TOPROL -XL) 25 MG 24 hr tablet Take 1 tablet (25 mg total) by mouth daily. 10/21/23   Delfina Feller, FNP  Multiple Vitamins-Minerals (ONE-A-DAY WOMENS 50+) TABS Take 1 tablet by mouth daily.    [provider]  polyethylene glycol powder (GLYCOLAX /MIRALAX ) 17 GM/SCOOP powder Take 17 g by mouth daily. 06/15/23   Galvin Jules, FNP  spironolactone  (ALDACTONE ) 25 MG tablet Take 1 tablet (25 mg total) by mouth daily. 11/04/23 02/02/24  Gerald Kitty., NP  traZODone  (DESYREL ) 50 MG tablet Take 1 tablet (50 mg total) by mouth at bedtime. 01/19/24   Delfina Feller, FNP                                                                                                                                    Past Surgical History Past Surgical History:  Procedure Laterality Date   CAROTID ENDARTERECTOMY Right    CAROTID STENT     LEFT HEART CATH AND CORONARY ANGIOGRAPHY N/A 04/07/2023   Procedure: LEFT HEART CATH AND CORONARY ANGIOGRAPHY;  Surgeon: Arnoldo Lapping, MD;  Location: [facility] INVASIVE CV LAB;  Service: Cardiovascular;  Laterality: N/A;   Family History Family History  Problem Relation Age of Onset   Heart disease Father    Heart attack Father    Hypertension Father    Diabetes Sister    Stroke Sister    Diabetes Brother    Diabetes Sister    Diabetes Sister    Diabetes Brother    Diabetes Brother    Diabetes Brother     Social History Social History   Tobacco Use   Smoking status: Former    Current packs/day: 0.00    Types: Cigarettes    Quit date: 12/02/2009    Years since quitting: 14.2   Smokeless tobacco: Never  Vaping Use   Vaping status: Never Used  Substance Use Topics   Alcohol use: No   Drug use: No   Allergies Crestor  [rosuvastatin ] and Niaspan [niacin er (antihyperlipidemic)]  Review of Systems Review of Systems  Physical Exam Vital Signs  I have reviewed  the triage vital signs BP (!) 88/64   Pulse (!) 105   Temp (!) 97.5 F (36.4 C)   Resp (!) 25   LMP 01/27/1983   SpO2 96%   Physical Exam Vitals reviewed.  Constitutional:      General: She is in acute distress.     Appearance: She is not toxic-appearing.  Neck:     Vascular:  JVD present.  Pulmonary:     Effort: Tachypnea present.     Breath sounds: Rales present.  Chest:     Chest wall: No mass.  Abdominal:     Palpations: Abdomen is soft.  Musculoskeletal:        General: Normal range of motion.     Cervical back: Normal range of motion.     Comments: Erythema on the middle of the right lower extremity consistent with cellulitis.  Neurological:     Mental Status: She is alert.     Comments: At baseline     ED Results and Treatments Labs (all labs ordered are listed, but only abnormal results are displayed) Labs Reviewed  CBC WITH DIFFERENTIAL/PLATELET - Abnormal; Notable for the following components:      Result Value   WBC 17.0 (*)    Neutro Abs 15.2 (*)    All other components within normal limits  COMPREHENSIVE METABOLIC PANEL WITH GFR - Abnormal; Notable for the following components:   CO2 20 (*)    Glucose, Bld 259 (*)    BUN 26 (*)    Creatinine, Ser 1.54 (*)    Albumin 3.4 (*)    GFR, Estimated 35 (*)    All other components within normal limits  I-STAT VENOUS BLOOD GAS, ED - Abnormal; Notable for the following components:   pCO2, Ven 38.0 (*)    pO2, Ven 148 (*)    Acid-base deficit 4.0 (*)    Calcium , Ion 1.11 (*)    All other components within normal limits  CULTURE, BLOOD (ROUTINE X 2)  CULTURE, BLOOD (ROUTINE X 2)  BRAIN NATRIURETIC PEPTIDE  LACTIC ACID, PLASMA  LACTIC ACID, PLASMA  TROPONIN I (HIGH SENSITIVITY)                                                                                                                          Radiology DG Chest Port 1 View Result Date: 03/16/2024 CLINICAL DATA:  Shortness of breath. EXAM: PORTABLE  CHEST 1 VIEW COMPARISON:  April 07, 2023. FINDINGS: Stable cardiomediastinal silhouette. Interstitial densities are noted throughout both lungs concerning for pulmonary edema. Also noted are multiple possible bilateral pulmonary nodules. Bony thorax is unremarkable. IMPRESSION: Bilateral interstitial densities are noted concerning for pulmonary edema. Multiple possible pulmonary nodules are also noted concerning for metastatic disease. CT scan of the chest is recommended for further evaluation. Electronically Signed   By: Rosalene Colon M.D.   On: 03/16/2024 14:32    Pertinent labs & imaging results that were available during my care of the patient were reviewed by me and considered in my medical decision making (see MDM for details).  Medications Ordered in ED Medications  ceFAZolin (ANCEF) IVPB 2g/100 mL premix (has no administration in time range)  furosemide  (LASIX ) injection 20 mg (20 mg Intravenous Given 03/16/24 1449)  Procedures .Critical Care  Performed by: Nathanael Baker, DO Authorized by: Nathanael Baker, DO   Critical care provider statement:    Critical care time (minutes):  33   Critical care was necessary to treat or prevent imminent or life-threatening deterioration of the following conditions:  Respiratory failure   Critical care was time spent personally by me on the following activities:  Development of treatment plan with patient or surrogate, discussions with consultants, evaluation of patient's response to treatment, examination of patient, ordering and review of laboratory studies, ordering and review of radiographic studies, ordering and performing treatments and interventions, pulse oximetry, re-evaluation of patient's condition and review of old charts   (including critical care time)  Medical Decision Making / ED Course   This  patient presents to the ED for concern of shortness of breath and chest pain, this involves an extensive number of treatment options, and is a complaint that carries with it a high risk of complications and morbidity.  The differential diagnosis includes heart failure, heart failure exacerbation, end-stage heart failure, chronic ischemia.  MDM: Upon arrival to the emergency department, patient hypoxic, tachypneic.  Placed patient on BiPAP, bedside ultrasound confirmed diffuse pulmonary edema.  This is also seen on chest x-ray.  Patient's blood pressure quite labile.  Systolic blood pressures have ranged between 80 and 110.  Will give some gentle diuresis here in the ED.  Did place a cardiology consultation, he said that they will come down to see the patient upon admission.  Patient's EKG does show chronic ischemic changes.  Does not meet STEMI criteria, and if it did, patient would not be a candidate for STEMI activation given her known disease, comorbidities  Patient does appear to have some cellulitis on her right lower extremity.  Would treat with Ancef.  Patient does have white count, do not believe patient has sepsis at this time.  I had a goals of care conversation with the patient's husband and daughter.  At this time, they would like to get the patient is a full code, however they expressed the desire that the patient "not suffer."  I believe that a inpatient palliative care consultation would be appropriate.   Reassessment 3:50 PM-patient with severe subclavian stenosis on the left side.  Blood pressure on the right arm shows a BP of 170/90.  Have increased her Lasix  dose.  Advanced heart failure team came down to evaluate the patient.  They will start the patient on continuous Lasix .  Will admit patient to progressive bed.   Additional history obtained: -Additional history obtained from family at bedside -External records from outside source obtained and reviewed including: Chart review  including previous notes, labs, imaging, consultation notes   Lab Tests: -I ordered, reviewed, and interpreted labs.   The pertinent results include:   Labs Reviewed  CBC WITH DIFFERENTIAL/PLATELET - Abnormal; Notable for the following components:      Result Value   WBC 17.0 (*)    Neutro Abs 15.2 (*)    All other components within normal limits  COMPREHENSIVE METABOLIC PANEL WITH GFR - Abnormal; Notable for the following components:   CO2 20 (*)    Glucose, Bld 259 (*)    BUN 26 (*)    Creatinine, Ser 1.54 (*)    Albumin 3.4 (*)    GFR, Estimated 35 (*)    All other components within normal limits  I-STAT VENOUS BLOOD GAS, ED - Abnormal; Notable for the following components:   pCO2,  Ven 38.0 (*)    pO2, Ven 148 (*)    Acid-base deficit 4.0 (*)    Calcium , Ion 1.11 (*)    All other components within normal limits  CULTURE, BLOOD (ROUTINE X 2)  CULTURE, BLOOD (ROUTINE X 2)  BRAIN NATRIURETIC PEPTIDE  LACTIC ACID, PLASMA  LACTIC ACID, PLASMA  TROPONIN I (HIGH SENSITIVITY)      EKG sinus tachycardia, left bundle branch block  EKG Interpretation Date/Time:    Ventricular Rate:    PR Interval:    QRS Duration:    QT Interval:    QTC Calculation:   R Axis:      Text Interpretation:           Imaging Studies ordered: I ordered imaging studies including chest x-ray I independently visualized and interpreted imaging. I agree with the radiologist interpretation   Medicines ordered and prescription drug management: Meds ordered this encounter  Medications   DISCONTD: furosemide  (LASIX ) injection 60 mg   furosemide  (LASIX ) injection 20 mg   ceFAZolin (ANCEF) IVPB 2g/100 mL premix    Antibiotic Indication::   Cellulitis    -I have reviewed the patients home medicines and have made adjustments as needed  Critical interventions Management of pulmonary edema, acute heart failure  Consultations Obtained: I requested consultation with the cardiology,  and  discussed lab and imaging findings as well as pertinent plan - they recommend: Admission   Cardiac Monitoring: The patient was maintained on a cardiac monitor.  I personally viewed and interpreted the cardiac monitored which showed an underlying rhythm of: Sinus rhythm  Social Determinants of Health:  Factors impacting patients care include: Multiple medical comorbidities including dementia   Reevaluation: After the interventions noted above, I reevaluated the patient and found that they have :improved  Co morbidities that complicate the patient evaluation  Past Medical History:  Diagnosis Date   Anxiety    Diabetes mellitus without complication (HCC)    Hyperlipidemia    Hypertension    Stroke [facility])       Dispostion: Admission     Final Clinical Impression(s) / ED Diagnoses Final diagnoses:  Acute heart failure, unspecified heart failure type (HCC)     @PCDICTATION @    Afton Horse T, DO 03/16/24 1528    Afton Horse T, DO 03/16/24 1552

## 2024-03-16 NOTE — ED Notes (Signed)
 Pt sitting upright talking with family.   Pt reports decreased WOB and comfortable on Bipap.

## 2024-03-16 NOTE — ED Provider Notes (Signed)
 6:37 PM Patient much more calm than on my initial evaluation after I saw her following signout.  I discussed the patient's case with her cardiology colleagues.  Recommendation from cardiology that patient be admitted to stepdown if she improves and she has improved with BiPAP, has began to diurese, blood pressure stable.   Dorenda Gandy, MD 03/16/24 Quin Brush

## 2024-03-17 ENCOUNTER — Other Ambulatory Visit: Payer: Self-pay

## 2024-03-17 DIAGNOSIS — E1142 Type 2 diabetes mellitus with diabetic polyneuropathy: Secondary | ICD-10-CM

## 2024-03-17 DIAGNOSIS — I10 Essential (primary) hypertension: Secondary | ICD-10-CM | POA: Diagnosis not present

## 2024-03-17 DIAGNOSIS — I2583 Coronary atherosclerosis due to lipid rich plaque: Secondary | ICD-10-CM

## 2024-03-17 DIAGNOSIS — I5023 Acute on chronic systolic (congestive) heart failure: Secondary | ICD-10-CM | POA: Diagnosis not present

## 2024-03-17 DIAGNOSIS — N1832 Chronic kidney disease, stage 3b: Secondary | ICD-10-CM | POA: Diagnosis not present

## 2024-03-17 DIAGNOSIS — Z8673 Personal history of transient ischemic attack (TIA), and cerebral infarction without residual deficits: Secondary | ICD-10-CM

## 2024-03-17 DIAGNOSIS — L03115 Cellulitis of right lower limb: Secondary | ICD-10-CM

## 2024-03-17 DIAGNOSIS — I2510 Atherosclerotic heart disease of native coronary artery without angina pectoris: Secondary | ICD-10-CM | POA: Diagnosis not present

## 2024-03-17 LAB — GLUCOSE, CAPILLARY
Glucose-Capillary: 192 mg/dL — ABNORMAL HIGH (ref 70–99)
Glucose-Capillary: 201 mg/dL — ABNORMAL HIGH (ref 70–99)
Glucose-Capillary: 207 mg/dL — ABNORMAL HIGH (ref 70–99)
Glucose-Capillary: 193 mg/dL — ABNORMAL HIGH (ref 70–99)

## 2024-03-17 LAB — BASIC METABOLIC PANEL WITH GFR
Anion gap: 9 (ref 5–15)
BUN: 23 mg/dL (ref 8–23)
CO2: 26 mmol/L (ref 22–32)
Calcium: 8.5 mg/dL — ABNORMAL LOW (ref 8.9–10.3)
Chloride: 102 mmol/L (ref 98–111)
Creatinine, Ser: 1.47 mg/dL — ABNORMAL HIGH (ref 0.44–1.00)
GFR, Estimated: 37 mL/min — ABNORMAL LOW (ref >60)
Glucose, Bld: 220 mg/dL — ABNORMAL HIGH (ref 70–99)
Potassium: 3.7 mmol/L (ref 3.5–5.1)
Sodium: 137 mmol/L (ref 135–145)

## 2024-03-17 LAB — CBC
HCT: 33.8 % — ABNORMAL LOW (ref 36.0–46.0)
Hemoglobin: 11.3 g/dL — ABNORMAL LOW (ref 12.0–15.0)
MCH: 29.4 pg (ref 26.0–34.0)
MCHC: 33.4 g/dL (ref 30.0–36.0)
MCV: 87.8 fL (ref 80.0–100.0)
Platelets: 274 10*3/uL (ref 150–400)
RBC: 3.85 MIL/uL — ABNORMAL LOW (ref 3.87–5.11)
RDW: 13.4 % (ref 11.5–15.5)
WBC: 13 10*3/uL — ABNORMAL HIGH (ref 4.0–10.5)
nRBC: 0 % (ref 0.0–0.2)

## 2024-03-17 LAB — HEMOGLOBIN A1C
Hgb A1c MFr Bld: 8.8 % — ABNORMAL HIGH (ref 4.8–5.6)
Mean Plasma Glucose: 205.86 mg/dL

## 2024-03-17 LAB — TSH: TSH: 1.506 u[IU]/mL (ref 0.350–4.500)

## 2024-03-17 MED ORDER — POTASSIUM CHLORIDE CRYS ER 20 MEQ PO TBCR
40.0000 meq | EXTENDED_RELEASE_TABLET | Freq: Once | ORAL | Status: AC
  Administered 2024-03-17: 40 meq via ORAL
  Filled 2024-03-17: qty 2

## 2024-03-17 MED ORDER — INSULIN GLARGINE-YFGN 100 UNIT/ML ~~LOC~~ SOLN
20.0000 [IU] | Freq: Every day | SUBCUTANEOUS | Status: DC
  Administered 2024-03-18 – 2024-03-20 (×3): 20 [IU] via SUBCUTANEOUS
  Filled 2024-03-17 (×3): qty 0.2

## 2024-03-17 NOTE — Progress Notes (Signed)
 Progress Note   Patient: Kylie Ward NFA:[id number] DOB: [birth year] DOA: 03/16/2024     1 DOS: the patient was seen and examined on 03/17/2024   Brief hospital course: Kylie Ward was admitted to the hospital with the working diagnosis of heart failure exacerbation.   78 yo female with the past medical history of dementia, coronary artery disease, heart failure, and peripheral vascular disease who presented with dyspnea. She was noted to have worsening dyspnea for the last 24 hrs prior to admission along with chest pain. She had persistent symptoms, prompting her husband to call EMS. She was found in respiratory distress, was placed on Bipap and transported to the ED. On her initial physical examination her blood pressure was 160/65, HR 105, RR 25 and 02 saturation 96%.  Lungs with coarse breath sounds bilaterally, heart with S1 and S2 present and regular with no gallops, abdomen with no distention and no lower extremity edema.   VBG 7,34/ 38/148/22/ 99%  Na 136, K 4,4 Cl 102 bicarbonate 20, glucose 259 bun 26 cr 1,54  AST 22 and ALT 13  BNP 435  High sensitive troponin 102, 976   Lactic acid 1,4 and 1,3  Wbc 17, hgb 12.7 plt 339   Chest radiograph with mild cardiomegaly, bilateral hilar vascular congestion, bilateral central interstitial infiltrates, positive bilateral peripheral nodular opacities. No effusions.   EKG 81 bpm, left axis deviation, left bundle branch block, qtc 504, sinus rhythm with no significant ST segment or T wave changes.   Assessment and Plan: * Acute on chronic systolic CHF (congestive heart failure) (HCC) Echocardiogram with  mild reduction in LV systolic function with EF 40 to 45%, moderate asymmetric left ventricular hypertrophy of the septal segment. RV systolic function preserved. LA with mild to moderate dilatation, mild to moderate mitral valve regurgitation, mild mitral stenosis,   LV apical septal segment and apical inferior segment are akinetic.  Mid anteroseptal segment and apical anterior segment are akinetic.   Continue volume overloaded.  Systolic blood pressure 120 mmHg range  Plan to continue diuresis with IV furosemide  80 mg bid  Continue metoprolol  and spironolactone .   Coronary artery disease Positive LV wall motion abnormalities.  Continue clopidogrel  and statin Elevation in high sensitive troponin due to heart failure, no acute coronary syndrome.   Essential hypertension, benign Continue blood pressure monitoring  Continue metoprolol .   CKD stage 3b, GFR 30-44 ml/min (HCC) Renal function with serum cr at 1,47 with K at 3,7 and serum bicarbonate at 26  Na 137   Added 40 meq Kcl to avoid hypokalemia Continue diuresis and close monitoring renal function and electrolytes.   Type 2 diabetes mellitus with diabetic polyneuropathy, with long-term current use of insulin  (HCC) Continue glucose cover and monitoring with insulin  sliding scale.  Basal insulin , will reduce dose in 50% to avoid hypoglycemia.    Dementia without behavioral disturbance (HCC) Continue with memantine   and donepezil .  Escitalopram .  As needed alprazolam  for anxiety.  Husband at the bedside   Cellulitis of right lower extremity Patient has been treated with antibiotic therapy for one month as outpatient.,  It has been improving.  Likely component of peripheral vascular disease.   Will hold on further antibiotic therapy and continue close monitoring.   History of CVA (cerebrovascular accident) Continue blood pressure control, statin and clopidogrel         Subjective: patient with improvement in dyspnea but not yet back to baseline, continue to have lower extremity edema,   Physical Exam:  Vitals:   03/16/24 2300 03/17/24 0350 03/17/24 0600 03/17/24 0814  BP: 133/66 128/72  (!) 154/72  Pulse: 85 80 73 81  Resp: 20 18 17 19   Temp: 98 F (36.7 C) 97.6 F (36.4 C)  97.7 F (36.5 C)  TempSrc: Oral Oral  Oral  SpO2: 98% 95% 98%  100%  Weight:  70.7 kg    Height:       Neurology awake and alert, deconditioned and ill looking appearing  ENT with positive pallor with no icterus Cardiovascular with S1 and S2 present and regular with no gallops, or rubs. Positive systolic murmur at the apex Positive JVD Positive lower extremity edema ++ Respiratory with bilateral rales with no wheezing or rhonchi, on anterior auscultation  Abdomen with no distention  Right lower extremity with faint rash and superficial wounds, left leg with medial distal similar rash, it is non tender or hot to the touch, no drainage.          Data Reviewed:    Family Communication: I spoke with patient's husband at the bedside, we talked in detail about patient's condition, plan of care and prognosis and all questions were addressed.   Disposition: Status is: Inpatient Remains inpatient appropriate because: IV diuresis   Planned Discharge Destination: Home      Author: Albertus Alt, MD 03/17/2024 9:07 AM  For on call review [email or web address].

## 2024-03-17 NOTE — Assessment & Plan Note (Signed)
Continue blood pressure control, statin and clopidogrel.

## 2024-03-17 NOTE — Assessment & Plan Note (Addendum)
 Echocardiogram with  mild reduction in LV systolic function with EF 40 to 45%, moderate asymmetric left ventricular hypertrophy of the septal segment. RV systolic function preserved. LA with mild to moderate dilatation, mild to moderate mitral valve regurgitation, mild mitral stenosis,   LV apical septal segment and apical inferior segment are akinetic. Mid anteroseptal segment and apical anterior segment are akinetic.   Patient was placed on IV furosemide  for diuresis,negative fluid balance was achieved with significant improvement in her symptoms.   Continue metoprolol , entresto , SGLT 2 inh and spironolactone .  Loop diuretic with torsemide  40 mg po daily.   Acute cardiogenic pulmonary edema with pleural effusions clinically  improved with diuresis, at the time of her discharge her 02 saturation was 95% on room air.  Follow up CT chest was negative for suspicious pulmonary nodule and had small bilateral pleural effusions.  CT

## 2024-03-17 NOTE — Plan of Care (Signed)

## 2024-03-17 NOTE — Assessment & Plan Note (Signed)
 Continue blood pressure monitoring  Continue metoprolol 

## 2024-03-17 NOTE — Assessment & Plan Note (Signed)
 Positive LV wall motion abnormalities.  Continue clopidogrel  and statin Elevation in high sensitive troponin due to heart failure, no acute coronary syndrome.

## 2024-03-17 NOTE — Assessment & Plan Note (Addendum)
 Continue glucose cover and monitoring with insulin  sliding scale.  Basal insulin , was reduced dose in 50% to avoid hypoglycemia.  Fasting glucose today is 124 mg/dl

## 2024-03-17 NOTE — Assessment & Plan Note (Addendum)
 Continue with memantine   and donepezil .  Escitalopram .  As needed alprazolam  for anxiety.

## 2024-03-17 NOTE — Assessment & Plan Note (Signed)
 Patient has been treated with antibiotic therapy for one month as outpatient.,  It has been improving.  Likely component of peripheral vascular disease.  Wbc is down to 13,   Continue to hold on further antibiotic therapy and continue close monitoring.

## 2024-03-17 NOTE — Assessment & Plan Note (Addendum)
 AKI At the time of her discharge her serum cr is 1,69 with K at 4,1 and serum bicarbonate at 26  Na 137   Continue diuresis with torsemide , SGLT 2 inh and spironolactone   Follow up renal function and electrolytes in 7 days as outpatient.

## 2024-03-17 NOTE — Progress Notes (Signed)
 Advanced Heart Failure Rounding Note   Subjective:    Remains on Bipap. Has diuresed well. Breathing much better. No orthopnea or PND. SCr improved  Objective:   Weight Range:  Vital Signs:   Temp:  [97.5 F (36.4 C)-98.5 F (36.9 C)] 97.7 F (36.5 C) (05/17 0814) Pulse Rate:  [73-105] 81 (05/17 0814) Resp:  [17-25] 19 (05/17 0814) BP: (88-160)/(64-124) 154/72 (05/17 0814) SpO2:  [95 %-100 %] 100 % (05/17 0814) FiO2 (%):  [60 %] 60 % (05/16 1907) Weight:  [70.7 kg-72.5 kg] 70.7 kg (05/17 0350)    Weight change: Filed Weights   03/16/24 2003 03/17/24 0350  Weight: 72.5 kg 70.7 kg    Intake/Output:   Intake/Output Summary (Last 24 hours) at 03/17/2024 0851 Last data filed at 03/17/2024 0728 Gross per 24 hour  Intake 454.89 ml  Output 1700 ml  Net -1245.11 ml     Physical Exam: General:  Lying in bed on Bipap No resp difficulty HEENT: normal Neck: supple. JVP10 . Carotids 2+ bilat; no bruits. No lymphadenopathy or thryomegaly appreciated. Cor: PMI nondisplaced. Regular rate & rhythm. No rubs, gallops or murmurs. Lungs: clear Abdomen: soft, nontender, nondistended. No hepatosplenomegaly. No bruits or masses. Good bowel sounds. Extremities: no cyanosis, clubbing, rash, 2+ edema  mild erythema on R shin Neuro: alert & orientedx3, cranial nerves grossly intact. moves all 4 extremities w/o difficulty. Affect pleasant  Telemetry: Sinus 70-80s with bundle Personally reviewed  Labs: Basic Metabolic Panel: Recent Labs  Lab 03/16/24 1441 03/16/24 1457 03/17/24 0356  NA 136 136 137  K 4.4 4.3 3.7  CL 102  --  102  CO2 20*  --  26  GLUCOSE 259*  --  220*  BUN 26*  --  23  CREATININE 1.54*  --  1.47*  CALCIUM  8.9  --  8.5*    Liver Function Tests: Recent Labs  Lab 03/16/24 1441  AST 22  ALT 13  ALKPHOS 76  BILITOT 0.8  PROT 6.6  ALBUMIN 3.4*   No results for input(s): "LIPASE", "AMYLASE" in the last 168 hours. No results for input(s): "AMMONIA" in  the last 168 hours.  CBC: Recent Labs  Lab 03/16/24 1441 03/16/24 1457 03/17/24 0356  WBC 17.0*  --  13.0*  NEUTROABS 15.2*  --   --   HGB 12.7 12.6 11.3*  HCT 39.0 37.0 33.8*  MCV 89.0  --  87.8  PLT 339  --  274    Cardiac Enzymes: No results for input(s): "CKTOTAL", "CKMB", "CKMBINDEX", "TROPONINI" in the last 168 hours.  BNP: BNP (last 3 results) Recent Labs    04/07/23 0838 02/24/24 1456 03/16/24 1443  BNP 870.1* 256.7* 435.0*    ProBNP (last 3 results) No results for input(s): "PROBNP" in the last 8760 hours.    Other results:  Imaging: DG Chest Port 1 View Result Date: 03/16/2024 CLINICAL DATA:  Shortness of breath. EXAM: PORTABLE CHEST 1 VIEW COMPARISON:  April 07, 2023. FINDINGS: Stable cardiomediastinal silhouette. Interstitial densities are noted throughout both lungs concerning for pulmonary edema. Also noted are multiple possible bilateral pulmonary nodules. Bony thorax is unremarkable. IMPRESSION: Bilateral interstitial densities are noted concerning for pulmonary edema. Multiple possible pulmonary nodules are also noted concerning for metastatic disease. CT scan of the chest is recommended for further evaluation. Electronically Signed   By: Rosalene Colon M.D.   On: 03/16/2024 14:32     Medications:     Scheduled Medications:  ALPRAZolam   1 mg  Oral BID   atorvastatin   40 mg Oral Daily   clopidogrel   75 mg Oral Daily   donepezil   10 mg Oral QHS   escitalopram   20 mg Oral Daily   furosemide   80 mg Intravenous BID   heparin   5,000 Units Subcutaneous Q8H   insulin  aspart  0-6 Units Subcutaneous TID WC   insulin  glargine-yfgn  40 Units Subcutaneous Daily   lisinopril   20 mg Oral Daily   memantine   10 mg Oral BID   metoprolol  succinate  25 mg Oral Daily   [START ON 03/18/2024] spironolactone   25 mg Oral Daily   traZODone   50 mg Oral QHS    Infusions:   ceFAZolin  (ANCEF ) IV 2 g (03/17/24 0839)    PRN Medications: acetaminophen  **OR**  acetaminophen , bisacodyl , hydrALAZINE , ondansetron  **OR** ondansetron  (ZOFRAN ) IV   Assessment/Plan:     Acute on chronic HFrEF -ICM. Also with known LBBB. QRS ~ 150 ms. -EF as low as 20-25% in 10/24. Improved to 40-45% in 10/24 -Bedside echo 5/16  EF down 25-30% -Complete echo pending - Improving but still volume overloaded. Continue Lasix  80 IV bid - Hold lisinopril  with plan to switch to Entresto this admit - Continue spiro 25 - Continue Toprol  25 - Farxiga  on hold - Not candidate for CRT at this time - Place TED hose   2. Acute respiratory failure -2/2 Acute on chronic CHF -much improved. Can stop Bipap  3. CAD -MV CAD including LM disease on cardiac cath 06/24 -Not a candidate for CABG -Has been on plavix  and statin. Continue   4. Carotid artery disease PAD -Hx left ICA stenting and R CEA -ABIs 3/25: R ABI 0.5 and L ABI 0.4   5. CKD IIIb - Scr stable Cr 1.54 -> 1.47 - Watch with diuresis - Eventually restart SGLT2i   6. Possible pulmonary nodules - Noted on Chest X-Ray - Defer workup to primary team. - Will likely need chest CT after diuresis   7. Possible RLE cellulitis - In setting of significant PAD - Started on ancef  - Blood cultures drawn - TRH managing   Length of Stay: 1   Jules Oar MD 03/17/2024, 8:51 AM  Advanced Heart Failure Team Pager [phone] (M-F; 7a - 4p)  Please contact [facility] for night-coverage after hours (4p -7a ) and weekends on [email or web address]

## 2024-03-17 NOTE — Hospital Course (Addendum)
 Kylie Ward was admitted to the hospital with the working diagnosis of heart failure exacerbation.   78 yo female with the past medical history of dementia, coronary artery disease, heart failure, and peripheral vascular disease who presented with dyspnea. She was noted to have worsening dyspnea for the last 24 hrs prior to admission along with chest pain. She had persistent symptoms, prompting her husband to call EMS. She was found in respiratory distress, was placed on Bipap and transported to the ED. On her initial physical examination her blood pressure was 160/65, HR 105, RR 25 and 02 saturation 96%.  Lungs with coarse breath sounds bilaterally, heart with S1 and S2 present and regular with no gallops, abdomen with no distention and no lower extremity edema.   VBG 7,34/ 38/148/22/ 99%  Na 136, K 4,4 Cl 102 bicarbonate 20, glucose 259 bun 26 cr 1,54  AST 22 and ALT 13  BNP 435  High sensitive troponin 102, 976   Lactic acid 1,4 and 1,3  Wbc 17, hgb 12.7 plt 339   Chest radiograph with mild cardiomegaly, bilateral hilar vascular congestion, bilateral central interstitial infiltrates, positive bilateral peripheral nodular opacities. No effusions.   EKG 81 bpm, left axis deviation, left bundle branch block, qtc 504, sinus rhythm with no significant ST segment or T wave changes.   05/18 improved volume status.  05/20 patient was placed on guideline directed medical therapy with good toleration, plan to follow up as outpatient.

## 2024-03-18 DIAGNOSIS — N1832 Chronic kidney disease, stage 3b: Secondary | ICD-10-CM | POA: Diagnosis not present

## 2024-03-18 DIAGNOSIS — I2510 Atherosclerotic heart disease of native coronary artery without angina pectoris: Secondary | ICD-10-CM | POA: Diagnosis not present

## 2024-03-18 DIAGNOSIS — I5023 Acute on chronic systolic (congestive) heart failure: Secondary | ICD-10-CM | POA: Diagnosis not present

## 2024-03-18 DIAGNOSIS — I10 Essential (primary) hypertension: Secondary | ICD-10-CM | POA: Diagnosis not present

## 2024-03-18 LAB — BASIC METABOLIC PANEL WITH GFR
Anion gap: 8 (ref 5–15)
BUN: 31 mg/dL — ABNORMAL HIGH (ref 8–23)
CO2: 25 mmol/L (ref 22–32)
Calcium: 8.4 mg/dL — ABNORMAL LOW (ref 8.9–10.3)
Chloride: 104 mmol/L (ref 98–111)
Creatinine, Ser: 1.99 mg/dL — ABNORMAL HIGH (ref 0.44–1.00)
GFR, Estimated: 26 mL/min — ABNORMAL LOW (ref >60)
Glucose, Bld: 131 mg/dL — ABNORMAL HIGH (ref 70–99)
Potassium: 4.4 mmol/L (ref 3.5–5.1)
Sodium: 137 mmol/L (ref 135–145)

## 2024-03-18 LAB — GLUCOSE, CAPILLARY
Glucose-Capillary: 118 mg/dL — ABNORMAL HIGH (ref 70–99)
Glucose-Capillary: 117 mg/dL — ABNORMAL HIGH (ref 70–99)
Glucose-Capillary: 127 mg/dL — ABNORMAL HIGH (ref 70–99)
Glucose-Capillary: 180 mg/dL — ABNORMAL HIGH (ref 70–99)

## 2024-03-18 LAB — MAGNESIUM: Magnesium: 1.9 mg/dL (ref 1.7–2.4)

## 2024-03-18 MED ORDER — MIDODRINE HCL 5 MG PO TABS
10.0000 mg | ORAL_TABLET | ORAL | Status: AC
  Administered 2024-03-18: 10 mg via ORAL
  Filled 2024-03-18: qty 2

## 2024-03-18 NOTE — Plan of Care (Signed)
   Problem: Education: Goal: Knowledge of General Education information will improve Description Including pain rating scale, medication(s)/side effects and non-pharmacologic comfort measures Outcome: Progressing

## 2024-03-18 NOTE — Progress Notes (Addendum)
 Progress Note   Patient: Kylie Ward ZOX:[id number] DOB: 11/23/1945 DOA: 03/16/2024     2 DOS: the patient was seen and examined on 03/18/2024   Brief hospital course: Mrs. Picking was admitted to the hospital with the working diagnosis of heart failure exacerbation.   78 yo female with the past medical history of dementia, coronary artery disease, heart failure, and peripheral vascular disease who presented with dyspnea. She was noted to have worsening dyspnea for the last 24 hrs prior to admission along with chest pain. She had persistent symptoms, prompting her husband to call EMS. She was found in respiratory distress, was placed on Bipap and transported to the ED. On her initial physical examination her blood pressure was 160/65, HR 105, RR 25 and 02 saturation 96%.  Lungs with coarse breath sounds bilaterally, heart with S1 and S2 present and regular with no gallops, abdomen with no distention and no lower extremity edema.   VBG 7,34/ 38/148/22/ 99%  Na 136, K 4,4 Cl 102 bicarbonate 20, glucose 259 bun 26 cr 1,54  AST 22 and ALT 13  BNP 435  High sensitive troponin 102, 976   Lactic acid 1,4 and 1,3  Wbc 17, hgb 12.7 plt 339   Chest radiograph with mild cardiomegaly, bilateral hilar vascular congestion, bilateral central interstitial infiltrates, positive bilateral peripheral nodular opacities. No effusions.   EKG 81 bpm, left axis deviation, left bundle branch block, qtc 504, sinus rhythm with no significant ST segment or T wave changes.   05/18 improved volume status.   Assessment and Plan: * Acute on chronic systolic CHF (congestive heart failure) (HCC) Echocardiogram with  mild reduction in LV systolic function with EF 40 to 45%, moderate asymmetric left ventricular hypertrophy of the septal segment. RV systolic function preserved. LA with mild to moderate dilatation, mild to moderate mitral valve regurgitation, mild mitral stenosis,   LV apical septal segment and apical  inferior segment are akinetic. Mid anteroseptal segment and apical anterior segment are akinetic.   Urine output not documented.  Clinically improved volume status  Systolic blood pressure 120 mmHg range  Hold on loop diuretic for now.  Continue metoprolol  and spironolactone .  Limited therapy due to acutely reduced GFR.   Coronary artery disease Positive LV wall motion abnormalities.  Continue clopidogrel  and statin Elevation in high sensitive troponin due to heart failure, no acute coronary syndrome.   Essential hypertension, benign Continue blood pressure monitoring  Continue metoprolol .   CKD stage 3b, GFR 30-44 ml/min (HCC) Worsening renal function with serum cr at 1,9 with K at 4,4 and serum bicarbonate at 25  Na 137 Mg 1,9   Plan to hold on furosemide  for now and follow up renal function and electrolytes in am.  One dose of midodrine today 10 mg  Avoid hypotension and nephrotoxic medications   Type 2 diabetes mellitus with diabetic polyneuropathy, with long-term current use of insulin  (HCC) Continue glucose cover and monitoring with insulin  sliding scale.  Basal insulin , reduced dose in 50% to avoid hypoglycemia.  Fasting glucose today is 131 mg/dl    Dementia without behavioral disturbance (HCC) Continue with memantine   and donepezil .  Escitalopram .  As needed alprazolam  for anxiety.  Husband at the bedside   Cellulitis of right lower extremity Patient has been treated with antibiotic therapy for one month as outpatient.,  It has been improving.  Likely component of peripheral vascular disease.  Wbc is down to 13,   Continue to hold on further antibiotic therapy and  continue close monitoring.    History of CVA (cerebrovascular accident) Continue blood pressure control, statin and clopidogrel         Subjective: patient is feeling better, no chest pain or dyspnea, edema has improved, she continue very weak and deconditioned   Physical Exam: Vitals:    03/17/24 1937 03/17/24 2334 03/18/24 0356 03/18/24 0810  BP: (!) 102/56 (!) 94/43 (!) 104/44 (!) 112/43  Pulse: 63 (!) 58 (!) 58   Resp: 18 19 17    Temp: 98 F (36.7 C) 97.6 F (36.4 C) 98.3 F (36.8 C) 98 F (36.7 C)  TempSrc: Oral Oral Oral Oral  SpO2: 98% 98% 90% 98%  Weight:   72.1 kg   Height:       Neurology awake and alert, deconditioned ENT with mild pallor Cardiovascular with S1 and S2 present and regular with no gallops, rubs, positive systolic murmur at the apex.  Respiratory with mild rales at bases with no wheezing or rhonchi  Abdomen with no distention  Trace non pitting lower extremity edema   Data Reviewed:    Family Communication: I spoke with patient's husband at the bedside, we talked in detail about patient's condition, plan of care and prognosis and all questions were addressed.   Disposition: Status is: Inpatient Remains inpatient appropriate because: recovering heart failure   Planned Discharge Destination: Home     Author: Albertus Alt, MD 03/18/2024 9:51 AM  For on call review [email or web address].

## 2024-03-18 NOTE — Progress Notes (Signed)
 Advanced Heart Failure Rounding Note   Subjective:    Remains on IV lasix . Diuresis has slowed.   No CP or SOB. Scr 1.5 -> 2.0   Feels good. Denies SOB, orthopnea or PND.    Objective:   Weight Range:  Vital Signs:   Temp:  [97.6 F (36.4 C)-98.8 F (37.1 C)] 98 F (36.7 C) (05/18 0810) Pulse Rate:  [58-69] 58 (05/18 0356) Resp:  [17-19] 17 (05/18 0356) BP: (90-152)/(41-75) 112/43 (05/18 0810) SpO2:  [90 %-98 %] 98 % (05/18 0810) Weight:  [72.1 kg] 72.1 kg (05/18 0356)    Weight change: Filed Weights   03/16/24 2003 03/17/24 0350 03/18/24 0356  Weight: 72.5 kg 70.7 kg 72.1 kg    Intake/Output:   Intake/Output Summary (Last 24 hours) at 03/18/2024 0826 Last data filed at 03/17/2024 1000 Gross per 24 hour  Intake 200.06 ml  Output --  Net 200.06 ml     Physical Exam: General:  Sitting up in bed No resp difficulty HEENT: normal Neck: supple. no JVD. Carotids 2+ bilat; no bruits. No lymphadenopathy or thryomegaly appreciated. Cor: PMI nondisplaced. Regular rate & rhythm. No rubs, gallops or murmurs. Lungs: clear Abdomen: soft, nontender, nondistended. No hepatosplenomegaly. No bruits or masses. Good bowel sounds. Extremities: no cyanosis, clubbing, rash, edema Neuro: alert & orientedx3, cranial nerves grossly intact. moves all 4 extremities w/o difficulty. Affect pleasant   Telemetry: Sinus 60-70s  Personally reviewed  Labs: Basic Metabolic Panel: Recent Labs  Lab 03/16/24 1441 03/16/24 1457 03/17/24 0356 03/18/24 0433  NA 136 136 137 137  K 4.4 4.3 3.7 4.4  CL 102  --  102 104  CO2 20*  --  26 25  GLUCOSE 259*  --  220* 131*  BUN 26*  --  23 31*  CREATININE 1.54*  --  1.47* 1.99*  CALCIUM  8.9  --  8.5* 8.4*  MG  --   --   --  1.9    Liver Function Tests: Recent Labs  Lab 03/16/24 1441  AST 22  ALT 13  ALKPHOS 76  BILITOT 0.8  PROT 6.6  ALBUMIN 3.4*   No results for input(s): "LIPASE", "AMYLASE" in the last 168 hours. No results  for input(s): "AMMONIA" in the last 168 hours.  CBC: Recent Labs  Lab 03/16/24 1441 03/16/24 1457 03/17/24 0356  WBC 17.0*  --  13.0*  NEUTROABS 15.2*  --   --   HGB 12.7 12.6 11.3*  HCT 39.0 37.0 33.8*  MCV 89.0  --  87.8  PLT 339  --  274    Cardiac Enzymes: No results for input(s): "CKTOTAL", "CKMB", "CKMBINDEX", "TROPONINI" in the last 168 hours.  BNP: BNP (last 3 results) Recent Labs    04/07/23 0838 02/24/24 1456 03/16/24 1443  BNP 870.1* 256.7* 435.0*    ProBNP (last 3 results) No results for input(s): "PROBNP" in the last 8760 hours.    Other results:  Imaging: DG Chest Port 1 View Result Date: 03/16/2024 CLINICAL DATA:  Shortness of breath. EXAM: PORTABLE CHEST 1 VIEW COMPARISON:  April 07, 2023. FINDINGS: Stable cardiomediastinal silhouette. Interstitial densities are noted throughout both lungs concerning for pulmonary edema. Also noted are multiple possible bilateral pulmonary nodules. Bony thorax is unremarkable. IMPRESSION: Bilateral interstitial densities are noted concerning for pulmonary edema. Multiple possible pulmonary nodules are also noted concerning for metastatic disease. CT scan of the chest is recommended for further evaluation. Electronically Signed   By: Irving Mantle.D.  On: 03/16/2024 14:32     Medications:     Scheduled Medications:  ALPRAZolam   1 mg Oral BID   atorvastatin   40 mg Oral Daily   clopidogrel   75 mg Oral Daily   donepezil   10 mg Oral QHS   escitalopram   20 mg Oral Daily   furosemide   80 mg Intravenous BID   heparin   5,000 Units Subcutaneous Q8H   insulin  aspart  0-6 Units Subcutaneous TID WC   insulin  glargine-yfgn  20 Units Subcutaneous Daily   memantine   10 mg Oral BID   metoprolol  succinate  25 mg Oral Daily   spironolactone   25 mg Oral Daily   traZODone   50 mg Oral QHS    Infusions:    PRN Medications: acetaminophen  **OR** acetaminophen , bisacodyl , hydrALAZINE , ondansetron  **OR** ondansetron  (ZOFRAN )  IV   Assessment/Plan:     Acute on chronic HFrEF - iCM. Also with known LBBB. QRS ~ 150 ms. - EF as low as 20-25% in 10/24. Improved to 40-45% in 10/24 - Bedside echo 5/16  EF down 25-30% - Complete echo pending - Volume status looks much better. SCr up. Stop lasix . Watch renal function. Doubt low output but if Scr continues to increase can consider RHC - Hold lisinopril  with plan to switch to Entresto this admit if BP tolerates - Continue spiro 25 - Continue Toprol  25 - Farxiga  on hold - Not candidate for CRT at this time - Place TED hose   2. Acute respiratory failure -2/2 Acute on chronic CHF -resolved  3. CAD -MV CAD including LM disease on cardiac cath 06/24 -Not a candidate for CABG -Has been on plavix  and statin.  - No current s/s angina   4. Carotid artery disease PAD -Hx left ICA stenting and R CEA -ABIs 3/25: R ABI 0.5 and L ABI 0.4   5. AKI on CKD IIIb - Scr stable Cr 1.54 -> 1.47 -> 1.99 - Likely cardiorenal syndrome - Daily BMET - Eventually restart SGLT2i   6. Possible pulmonary nodules - Noted on Chest X-Ray - Defer workup to primary team. - Will likely need chest CT after diuresis   7. Possible RLE cellulitis - Started on ancef  - Blood cultures drawn - TRH managing - looks much better today   Length of Stay: 2   Jules Oar MD 03/18/2024, 8:26 AM  Advanced Heart Failure Team Pager [phone] (M-F; 7a - 4p)  Please contact [facility] for night-coverage after hours (4p -7a ) and weekends on [email or web address]

## 2024-03-18 NOTE — Plan of Care (Signed)

## 2024-03-19 ENCOUNTER — Other Ambulatory Visit (HOSPITAL_COMMUNITY): Payer: Self-pay

## 2024-03-19 ENCOUNTER — Telehealth (HOSPITAL_COMMUNITY): Payer: Self-pay | Admitting: Pharmacy Technician

## 2024-03-19 ENCOUNTER — Inpatient Hospital Stay (HOSPITAL_COMMUNITY)

## 2024-03-19 DIAGNOSIS — E1142 Type 2 diabetes mellitus with diabetic polyneuropathy: Secondary | ICD-10-CM | POA: Diagnosis not present

## 2024-03-19 DIAGNOSIS — I5023 Acute on chronic systolic (congestive) heart failure: Secondary | ICD-10-CM | POA: Diagnosis not present

## 2024-03-19 DIAGNOSIS — N1832 Chronic kidney disease, stage 3b: Secondary | ICD-10-CM | POA: Diagnosis not present

## 2024-03-19 DIAGNOSIS — I2510 Atherosclerotic heart disease of native coronary artery without angina pectoris: Secondary | ICD-10-CM | POA: Diagnosis not present

## 2024-03-19 LAB — ECHOCARDIOGRAM COMPLETE
Area-P 1/2: 2.17 cm2
Height: 62.008 in
S' Lateral: 3.3 cm
Weight: 2543.23 [oz_av]

## 2024-03-19 LAB — GLUCOSE, CAPILLARY
Glucose-Capillary: 122 mg/dL — ABNORMAL HIGH (ref 70–99)
Glucose-Capillary: 116 mg/dL — ABNORMAL HIGH (ref 70–99)
Glucose-Capillary: 117 mg/dL — ABNORMAL HIGH (ref 70–99)
Glucose-Capillary: 201 mg/dL — ABNORMAL HIGH (ref 70–99)

## 2024-03-19 LAB — BASIC METABOLIC PANEL WITH GFR
Anion gap: 9 (ref 5–15)
BUN: 38 mg/dL — ABNORMAL HIGH (ref 8–23)
CO2: 25 mmol/L (ref 22–32)
Calcium: 8.4 mg/dL — ABNORMAL LOW (ref 8.9–10.3)
Chloride: 102 mmol/L (ref 98–111)
Creatinine, Ser: 1.74 mg/dL — ABNORMAL HIGH (ref 0.44–1.00)
GFR, Estimated: 30 mL/min — ABNORMAL LOW (ref >60)
Glucose, Bld: 111 mg/dL — ABNORMAL HIGH (ref 70–99)
Potassium: 4.4 mmol/L (ref 3.5–5.1)
Sodium: 136 mmol/L (ref 135–145)

## 2024-03-19 MED ORDER — TORSEMIDE 20 MG PO TABS
40.0000 mg | ORAL_TABLET | Freq: Every day | ORAL | Status: DC
  Administered 2024-03-19 – 2024-03-20 (×2): 40 mg via ORAL
  Filled 2024-03-19 (×2): qty 2

## 2024-03-19 MED ORDER — DAPAGLIFLOZIN PROPANEDIOL 10 MG PO TABS
10.0000 mg | ORAL_TABLET | Freq: Every day | ORAL | Status: DC
  Administered 2024-03-19 – 2024-03-20 (×2): 10 mg via ORAL
  Filled 2024-03-19 (×2): qty 1

## 2024-03-19 MED ORDER — SACUBITRIL-VALSARTAN 24-26 MG PO TABS
1.0000 | ORAL_TABLET | Freq: Two times a day (BID) | ORAL | Status: DC
  Administered 2024-03-20: 1 via ORAL
  Filled 2024-03-19: qty 1

## 2024-03-19 MED ORDER — PERFLUTREN LIPID MICROSPHERE
1.0000 mL | INTRAVENOUS | Status: AC | PRN
Start: 2024-03-19 — End: 2024-03-19
  Administered 2024-03-19: 3 mL via INTRAVENOUS

## 2024-03-19 NOTE — Plan of Care (Signed)
   Problem: Education: Goal: Knowledge of General Education information will improve Description Including pain rating scale, medication(s)/side effects and non-pharmacologic comfort measures Outcome: Progressing   Problem: Health Behavior/Discharge Planning: Goal: Ability to manage health-related needs will improve Outcome: Progressing

## 2024-03-19 NOTE — Progress Notes (Signed)
  Echocardiogram 2D Echocardiogram has been performed.  Fain Home RDCS 03/19/2024, 3:16 PM

## 2024-03-19 NOTE — Evaluation (Addendum)
 Occupational Therapy Evaluation Patient Details Name: Kylie Ward MRN: [id number] DOB: [birth year] Today's Date: 03/19/2024   History of Present Illness   78 yo female experiencing increased dyspnea and chest pain for 24 hours when husband called EMS 5/16 found to be in respiratory distress, and placed on BiPAP and transported to ED where she was found to be in acute on chronic systolic CHF and admitted. PMH: dementia, coronary artery disease, heart failure, and peripheral vascular disease who presented with dyspnea.     Clinical Impressions At baseline "Kylie Ward" lives with her husband who assists as needed with mobility and ADL tasks due to her dementia. Pt's husband able to demonstrate the ability to assist her with mobility and ADL tasks during the session and states she is close to her baseline. Daughter present and also agrees. VSS on RA. Encourage pt to walk with her husband and staff. No DME needs. Acute OT signing off.      If plan is discharge home, recommend the following:   A little help with walking and/or transfers;A little help with bathing/dressing/bathroom;Direct supervision/assist for medications management;Direct supervision/assist for financial management;Assistance with cooking/housework;Assist for transportation;Help with stairs or ramp for entrance     Functional Status Assessment   Patient has not had a recent decline in their functional status     Equipment Recommendations   None recommended by OT     Recommendations for Other Services         Precautions/Restrictions   Precautions Precautions: Fall Restrictions Weight Bearing Restrictions Per Provider Order: No     Mobility Bed Mobility Overal bed mobility: Needs Assistance Bed Mobility: Supine to Sit     Supine to sit: Min assist     General bed mobility comments: min A for pt to pull against PT with B UE to bring herself to edge of flattened bed, pt reports her husband assists her  at home    Transfers Overall transfer level: Needs assistance   Transfers: Sit to/from Stand Sit to Stand: Min assist           General transfer comment: husband assists with sit - stand      Balance Overall balance assessment: Needs assistance Sitting-balance support: Feet supported, No upper extremity supported Sitting balance-Leahy Scale: Good     Standing balance support: During functional activity, Bilateral upper extremity supported, Reliant on assistive device for balance Standing balance-Leahy Scale: Fair Standing balance comment: husband provides balance assist in standing                           ADL either performed or assessed with clinical judgement   ADL Overall ADL's : At baseline (husband assisting)      Able to ambulate to the bathroom with HHA of husband. Pt was able to complete peri-care and manage her clothing with S. Husband guided pt out of the bathroom holding her hand. Husband states hand holding works best. Daughter able to assist as needed after DC. Encouraged pt/husband to use their shower seta for energy conservation and noted Kylie Ward may need a little more assistance after DC. Husband verbalized understanding.                                        Vision Ability to See in Adequate Light: 0 Adequate       Perception  Praxis         Pertinent Vitals/Pain Pain Assessment Pain Assessment: No/denies pain     Extremity/Trunk Assessment Upper Extremity Assessment Upper Extremity Assessment: Overall WFL for tasks assessed   Lower Extremity Assessment Lower Extremity Assessment: Defer to PT evaluation   Cervical / Trunk Assessment Cervical / Trunk Assessment: Kyphotic;Normal   Communication Communication Communication: No apparent difficulties   Cognition Arousal: Alert Behavior During Therapy: WFL for tasks assessed/performed Cognition: History of cognitive impairments                                Following commands: Intact       Cueing  General Comments   Cueing Techniques: Verbal cues;Gestural cues;Tactile cues  VSS on RA   Exercises     Shoulder Instructions      Home Living Family/patient expects to be discharged to:: Private residence Living Arrangements: Spouse/significant other;Children Available Help at Discharge: Family;Available 24 hours/day Type of Home: House Home Access: Stairs to enter [employer] of Steps: 3 Entrance Stairs-Rails: Right;Left Home Layout: One level     Bathroom Shower/Tub: Chief Strategy Officer: Standard     Home Equipment: Agricultural consultant (2 wheels)          Prior Functioning/Environment Prior Level of Function : Needs assist             Mobility Comments: pt ambulates with husband HHA ADLs Comments: husband assists with iADLs, per pt independent with bathing and dressing    OT Problem List: Decreased activity tolerance;Cardiopulmonary status limiting activity   OT Treatment/Interventions:        OT Goals(Current goals can be found in the care plan section)   Acute Rehab OT Goals Patient Stated Goal: husband's goal is to DC home soon OT Goal Formulation: All assessment and education complete, DC therapy   OT Frequency:       Co-evaluation              AM-PAC OT "6 Clicks" Daily Activity     Outcome Measure Help from another person eating meals?: None Help from another person taking care of personal grooming?: A Little Help from another person toileting, which includes using toliet, bedpan, or urinal?: A Little Help from another person bathing (including washing, rinsing, drying)?: A Little Help from another person to put on and taking off regular upper body clothing?: A Little Help from another person to put on and taking off regular lower body clothing?: A Little 6 Click Score: 19   End of Session Nurse Communication: Mobility status  Activity Tolerance:  Patient tolerated treatment well Patient left: in chair;with call bell/phone within reach;with family/visitor present  OT Visit Diagnosis: Unsteadiness on feet (R26.81)                Time: 5366-4403 OT Time Calculation (min): 16 min Charges:  OT General Charges $OT Visit: 1 Visit OT Evaluation $OT Eval Moderate Complexity: 1 Mod  Liller Yohn, OT/L   Acute OT Clinical Specialist Acute Rehabilitation Services Pager [phone] Office [phone]   [facility] 03/19/2024, 4:45 PM

## 2024-03-19 NOTE — Progress Notes (Signed)
 Progress Note   Patient: Kylie Ward WUJ:[id number] DOB: 1945/12/30 DOA: 03/16/2024     3 DOS: the patient was seen and examined on 03/19/2024   Brief hospital course: Mrs. Riester was admitted to the hospital with the working diagnosis of heart failure exacerbation.   78 yo female with the past medical history of dementia, coronary artery disease, heart failure, and peripheral vascular disease who presented with dyspnea. She was noted to have worsening dyspnea for the last 24 hrs prior to admission along with chest pain. She had persistent symptoms, prompting her husband to call EMS. She was found in respiratory distress, was placed on Bipap and transported to the ED. On her initial physical examination her blood pressure was 160/65, HR 105, RR 25 and 02 saturation 96%.  Lungs with coarse breath sounds bilaterally, heart with S1 and S2 present and regular with no gallops, abdomen with no distention and no lower extremity edema.   VBG 7,34/ 38/148/22/ 99%  Na 136, K 4,4 Cl 102 bicarbonate 20, glucose 259 bun 26 cr 1,54  AST 22 and ALT 13  BNP 435  High sensitive troponin 102, 976   Lactic acid 1,4 and 1,3  Wbc 17, hgb 12.7 plt 339   Chest radiograph with mild cardiomegaly, bilateral hilar vascular congestion, bilateral central interstitial infiltrates, positive bilateral peripheral nodular opacities. No effusions.   EKG 81 bpm, left axis deviation, left bundle branch block, qtc 504, sinus rhythm with no significant ST segment or T wave changes.   05/18 improved volume status.   Assessment and Plan: * Acute on chronic systolic CHF (congestive heart failure) (HCC) Echocardiogram with  mild reduction in LV systolic function with EF 40 to 45%, moderate asymmetric left ventricular hypertrophy of the septal segment. RV systolic function preserved. LA with mild to moderate dilatation, mild to moderate mitral valve regurgitation, mild mitral stenosis,   LV apical septal segment and apical  inferior segment are akinetic. Mid anteroseptal segment and apical anterior segment are akinetic.   Urine output documented 400 cc (possible not accurate)  Clinically improved volume status  Systolic blood pressure 120 mmHg range  Continue metoprolol  and spironolactone .  Placed on entresto  and resumed loop diuretic with torsemide   Added SGLT 2 inh   Coronary artery disease Positive LV wall motion abnormalities.  Continue clopidogrel  and statin Elevation in high sensitive troponin due to heart failure, no acute coronary syndrome.   Essential hypertension, benign Continue blood pressure monitoring  Continue metoprolol .   CKD stage 3b, GFR 30-44 ml/min (HCC) Renal function with serum cr at 1,74 with K at 4,4 and serum bicarbonate at 25  Na 136 and Mg 1,9   Continue diuresis with torsemide , SGLT 2 inh and spironolactone   Follow up renal function and electrolytes in am.   Type 2 diabetes mellitus with diabetic polyneuropathy, with long-term current use of insulin  (HCC) Continue glucose cover and monitoring with insulin  sliding scale.  Basal insulin , reduced dose in 50% to avoid hypoglycemia.  Fasting glucose today is 111 mg/dl    Dementia without behavioral disturbance (HCC) Continue with memantine   and donepezil .  Escitalopram .  As needed alprazolam  for anxiety.  Husband at the bedside   Cellulitis of right lower extremity Patient has been treated with antibiotic therapy for one month as outpatient.,  It has been improving.  Likely component of peripheral vascular disease.  Wbc is down to 13,   Continue to hold on further antibiotic therapy and continue close monitoring.    History of  CVA (cerebrovascular accident) Continue blood pressure control, statin and clopidogrel         Subjective: Patient is feeling better, dyspnea and edema have improved, no chest pain, continue very weak and deconditioned   Physical Exam: Vitals:   03/18/24 1539 03/18/24 2005 03/19/24  0023 03/19/24 0440  BP: (!) 130/53 (!) 108/49 (!) 112/55 (!) 120/5  Pulse: (!) 57 (!) 57 61 61  Resp: 18 16 16 16   Temp: 98.3 F (36.8 C) 97.6 F (36.4 C) 98 F (36.7 C) 98.4 F (36.9 C)  TempSrc: Oral Oral Oral Oral  SpO2: 100% 97% 99% 93%  Weight:      Height:       Neurology awake and alert ENT with mild pallor Cardiovascular with S1 and S2 present and regular with no gallops or rubs, positive systolic murmur at the apex No JVD Respiratory with mild rales at bases with no wheezing or rhonchi  Abdomen with no distention  Data Reviewed:    Family Communication: no family at the bedside   Disposition: Status is: Inpatient Remains inpatient appropriate because: heart failure exacerbation recovery   Planned Discharge Destination: Home     Author: Albertus Alt, MD 03/19/2024 7:35 AM  For on call review [email or web address].

## 2024-03-19 NOTE — Telephone Encounter (Signed)
 Patient Product/process development scientist completed.    The patient is insured through [facility]. Patient has Medicare and is not eligible for a copay card, but may be able to apply for patient assistance or Medicare RX Payment Plan (Patient Must reach out to their plan, if eligible for payment plan), if available.    Ran test claim for Entresto 24-26 mg and the current 30 day co-pay is $47.00.   This test claim was processed through [facility]- copay amounts may vary at other pharmacies due to pharmacy/plan contracts, or as the patient moves through the different stages of their insurance plan.     Roland Earl, CPHT Pharmacy Technician III Certified Patient Advocate [facility] Patient Advocate Team Direct Number: [phone]  Fax: [phone]

## 2024-03-19 NOTE — Progress Notes (Addendum)
 Advanced Heart Failure Rounding Note  Chief Complaint: Volume Overload Subjective:    sCr 2.0>1.74. Diuresis held yesterday.   Sitting up in the chair. Husband at bedside. Feels much better today.   Objective:    Vital Signs:   Temp:  [97.6 F (36.4 C)-98.4 F (36.9 C)] 97.8 F (36.6 C) (05/19 0742) Pulse Rate:  [57-61] 61 (05/19 0742) Resp:  [16-20] 16 (05/19 0742) BP: (108-130)/(5-59) 125/49 (05/19 0742) SpO2:  [93 %-100 %] 93 % (05/19 0742)   Weight change: Filed Weights   03/16/24 2003 03/17/24 0350 03/18/24 0356  Weight: 72.5 kg 70.7 kg 72.1 kg   Intake/Output:  Intake/Output Summary (Last 24 hours) at 03/19/2024 1127 Last data filed at 03/19/2024 0914 Gross per 24 hour  Intake 360 ml  Output 400 ml  Net -40 ml    Physical Exam: General: Elderly appearing. No distress on RA Cardiac: JVP flat. S1 and S2 present. No murmurs or rub. Extremities: Warm and dry.  No peripheral edema.  Neuro: Alert and oriented x3. Affect pleasant. Moves all extremities without difficulty.  Telemetry: SR in 60s (personally reviewed)  Labs: Basic Metabolic Panel: Recent Labs  Lab 03/16/24 1441 03/16/24 1457 03/17/24 0356 03/18/24 0433 03/19/24 0525  NA 136 136 137 137 136  K 4.4 4.3 3.7 4.4 4.4  CL 102  --  102 104 102  CO2 20*  --  26 25 25   GLUCOSE 259*  --  220* 131* 111*  BUN 26*  --  23 31* 38*  CREATININE 1.54*  --  1.47* 1.99* 1.74*  CALCIUM  8.9  --  8.5* 8.4* 8.4*  MG  --   --   --  1.9  --    Liver Function Tests: Recent Labs  Lab 03/16/24 1441  AST 22  ALT 13  ALKPHOS 76  BILITOT 0.8  PROT 6.6  ALBUMIN 3.4*   CBC: Recent Labs  Lab 03/16/24 1441 03/16/24 1457 03/17/24 0356  WBC 17.0*  --  13.0*  NEUTROABS 15.2*  --   --   HGB 12.7 12.6 11.3*  HCT 39.0 37.0 33.8*  MCV 89.0  --  87.8  PLT 339  --  274   BNP (last 3 results) Recent Labs    04/07/23 0838 02/24/24 1456 03/16/24 1443  BNP 870.1* 256.7* 435.0*   Medications:    Scheduled  Medications:  ALPRAZolam   1 mg Oral BID   atorvastatin   40 mg Oral Daily   clopidogrel   75 mg Oral Daily   donepezil   10 mg Oral QHS   escitalopram   20 mg Oral Daily   heparin   5,000 Units Subcutaneous Q8H   insulin  aspart  0-6 Units Subcutaneous TID WC   insulin  glargine-yfgn  20 Units Subcutaneous Daily   memantine   10 mg Oral BID   metoprolol  succinate  25 mg Oral Daily   spironolactone   25 mg Oral Daily   traZODone   50 mg Oral QHS   Infusions:  PRN Medications: acetaminophen  **OR** acetaminophen , bisacodyl , ondansetron  **OR** ondansetron  (ZOFRAN ) IV  Assessment/Plan:   Acute on chronic HFrEF - iCM. Also with known LBBB. QRS ~ 150 ms. - EF as low as 20-25% in 10/24. Improved to 40-45% in 10/24 - Bedside echo 5/16 EF down 25-30% - Complete echo ordered; pending - AKI improving with holding diuresis.  - Add Entresto  24-26 mg bid tomorrow (holding for ACE washout) - Continue spiro 25 mg daily - Continue Toprol  25 mg daily - Resume Farxiga  10 mg  daily - Start Torsemide  40 mg daily - Not candidate for CRT at this time - Place TED hose   2. Acute respiratory failure - 2/2 Acute on chronic CHF - resolved  3. CAD - MV CAD including LM disease on cardiac cath 06/24 - Not a candidate for CABG - Has been on plavix  and statin.  - No current s/s angina   4. Carotid artery disease PAD - Hx left ICA stenting and R CEA - ABIs 3/25: R ABI 0.5 and L ABI 0.4   5. AKI on CKD IIIb - Scr improving 2.0>1.74 - Likely cardiorenal syndrome - Resume Farxiga    6. Possible pulmonary nodules - noted on CXR - Defer workup to primary team - Recommend chest CT today  7. Possible RLE cellulitis - Received ancef  - improved with diuresis - TRH managing  Stable for discharge from HF standpoint after CT.   Length of Stay: 3  [place] Lee, NP 03/19/2024, 11:27 AM  Advanced Heart Failure Team Pager [phone] (M-F; 7a - 4p)  Please contact [facility] for night-coverage after  hours (4p -7a ) and weekends on [email or web address]   Patient seen and examined with the above-signed Advanced Practice Provider and/or Housestaff. I personally reviewed laboratory data, imaging studies and relevant notes. I independently examined the patient and formulated the important aspects of the plan. I have edited the note to reflect any of my changes or salient points. I have personally discussed the plan with the patient and/or family.  Looks and feels much better. No further SOB. Scr improved with holding lasix   General: Sitting in chair No resp difficulty HEENT: normal Neck: supple. no JVD. Carotids 2+ bilat; no bruits. No lymphadenopathy or thryomegaly appreciated. Cor: PMI nondisplaced. Regular rate & rhythm. No rubs, gallops or murmurs. Lungs: clear Abdomen: obese soft, nontender, nondistended. No hepatosplenomegaly. No bruits or masses. Good bowel sounds. Extremities: no cyanosis, clubbing, rash, edema Neuro: alert & orientedx3, cranial nerves grossly intact. moves all 4 extremities w/o difficulty. Affect pleasant  She is optimized from HF standpoint. Agree with meds as above. Will need CT scan to valuate Pulmonary nodules on CXR.   Will f/u in HF Clinic.   Jules Oar, MD  5:02 PM

## 2024-03-19 NOTE — TOC Initial Note (Signed)
 Transition of Care [facility]) - Initial/Assessment Note    Patient Details  Name: Kylie Ward MRN: [id number] Date of Birth: November 22, 1945  [facility] of [facility]) CM/SW Contact:    Ernst Heap Phone Number: [phone] 03/19/2024, 12:20 PM  Clinical Narrative:    HF CSW met with patient and husband at bedside. Patient and spouse stated that they live together. Patients spouse stated that she has no history of HH services. Patients spouse stated that she uses a walker and wheelchair at home. Patients spouse stated that they have a PCP. CSW explained that a hospital follow up appointment will be scheduled closer towards dc. Patients spouse stated that he will schedule her appointment and provide transportation home.   TOC will continue following.                Expected Discharge Plan: Home/Self Care Barriers to Discharge: Continued Medical Work up   Patient Goals and CMS Choice            Expected Discharge Plan and Services       Living arrangements for the past 2 months: Mobile Home                                      Prior Living Arrangements/Services Living arrangements for the past 2 months: Mobile Home Lives with:: Spouse Patient language and need for interpreter reviewed:: Yes Do you feel safe going back to the place where you live?: Yes      Need for Family Participation in Patient Care: Yes (Comment) Care giver support system in place?: Yes (comment)   Criminal Activity/Legal Involvement Pertinent to Current Situation/Hospitalization: No - Comment as needed  Activities of Daily Living   ADL Screening (condition at time of admission) Independently performs ADLs?: Yes (appropriate for developmental age) Is the patient deaf or have difficulty hearing?: No Does the patient have difficulty seeing, even when wearing glasses/contacts?: No Does the patient have difficulty concentrating, remembering, or making decisions?: No  Permission  Sought/Granted                  Emotional Assessment Appearance:: Appears stated age Attitude/Demeanor/Rapport: Engaged Affect (typically observed): Appropriate Orientation: : Oriented to Self, Oriented to Place, Oriented to  Time, Oriented to Situation Alcohol / Substance Use: Not Applicable Psych Involvement: No (comment)  Admission diagnosis:  Acute exacerbation of CHF (congestive heart failure) (HCC) [I50.9] Acute heart failure, unspecified heart failure type (HCC) [I50.9] Patient Active Problem List   Diagnosis Date Noted   Coronary artery disease 03/17/2024   Acute on chronic systolic CHF (congestive heart failure) (HCC) 03/17/2024   Cellulitis of right lower extremity 03/17/2024   CKD stage 3b, GFR 30-44 ml/min (HCC) 03/17/2024   Acute exacerbation of CHF (congestive heart failure) (HCC) 03/16/2024   Primary insomnia 05/03/2023   Chronic midline low back pain without sciatica 05/03/2023   Coronary artery disease involving native coronary artery of native heart with unstable angina pectoris (HCC) 04/08/2023   Dementia without behavioral disturbance (HCC) 04/07/2023   Drug-induced myopathy 10/30/2020   Type 2 diabetes mellitus with diabetic polyneuropathy, with long-term current use of insulin  (HCC) 03/06/2018   Atherosclerosis of aorta (HCC) 09/04/2016   Peripheral edema 11/13/2015   GAD (generalized anxiety disorder) 08/12/2015   BMI 31.0-31.9,adult 05/08/2015   Carotid artery stenosis, symptomatic 01/14/2014   History of CVA (cerebrovascular accident) 12/10/2013   Hyperlipidemia associated with  type 2 diabetes mellitus (HCC) 01/26/2013   Essential hypertension, benign 01/26/2013   PCP:  Delfina Feller, FNP Pharmacy:   [facility] [place], [place] - [address] [place] [address] Phone: [phone] Fax: [phone]     Social Drivers of Health (SDOH) Social History: SDOH Screenings   Food Insecurity: No  Food Insecurity (03/16/2024)  Housing: Unknown (03/16/2024)  Transportation Needs: Patient Unable To Answer (03/16/2024)  Utilities: Not At Risk (03/16/2024)  Alcohol Screen: Low Risk  (05/07/2021)  Depression (PHQ2-9): Low Risk  (10/21/2023)  Recent Concern: Depression (PHQ2-9) - High Risk (09/05/2023)  Financial Resource Strain: Low Risk  (05/10/2022)  Physical Activity: Inactive (05/10/2022)  Social Connections: Patient Declined (03/16/2024)  Stress: No Stress Concern Present (05/10/2022)  Tobacco Use: Medium Risk (02/24/2024)   SDOH Interventions:     Readmission Risk Interventions     No data to display

## 2024-03-19 NOTE — Care Management Important Message (Signed)
 Important Message  Patient Details  Name: LYLIANA DICENSO MRN: [id number] Date of Birth: 05-02-1946   Important Message Given:  Yes - Medicare IM     Felix Host 03/19/2024, 3:11 PM

## 2024-03-19 NOTE — Evaluation (Signed)
 Physical Therapy Evaluation Patient Details Name: Kylie Ward MRN: [id number] DOB: Oct 16, 1946 Today's Date: 03/19/2024  History of Present Illness  78 yo female experiencing increased dyspnea and chest pain for 24 hours when husband called EMS 5/16 found to be in respiratory distress, and placed on BiPAP and transported to ED where she was found to be in acute on chronic systolic CHF and admitted. PMH: dementia, coronary artery disease, heart failure, and peripheral vascular disease who presented with dyspnea.  Clinical Impression  PTA pt living with husband in single level home with 3 steps to enter. Husband provides HH assist to patient for mobility and iADLs. Pt reports independence with ADLs. Pt is limited in safe mobility by underlying cognitive deficits in presence of generalized weakness and mild instability. Pt is overall minA for bed mobility, transfers and short distance ambulation all of which PT observed how husband assists pt. Pt not noted to have an increased DOE with ambulation. Husband reports pt is at her baseline level of function. Pt has no PT or equipment needs at discharge. PT will continue to see pt acutely and has referred pt to Mobility Specialist to supervise pt and husband ambulation in hallway.       If plan is discharge home, recommend the following: A little help with walking and/or transfers;A little help with bathing/dressing/bathroom;Assistance with cooking/housework;Direct supervision/assist for medications management;Direct supervision/assist for financial management;Assist for transportation;Help with stairs or ramp for entrance;Supervision due to cognitive status   Can travel by private vehicle    Yes    Equipment Recommendations None recommended by PT     Functional Status Assessment Patient has not had a recent decline in their functional status     Precautions / Restrictions Precautions Precautions: Fall Restrictions Weight Bearing Restrictions  Per Provider Order: No      Mobility  Bed Mobility Overal bed mobility: Needs Assistance Bed Mobility: Supine to Sit     Supine to sit: Min assist     General bed mobility comments: min A for pt to pull against PT with B UE to bring herself to edge of flattened bed, pt reports her husband assists her at home    Transfers Overall transfer level: Needs assistance   Transfers: Sit to/from Stand Sit to Stand: Min assist           General transfer comment: good power up, min A and increased time for steadying    Ambulation/Gait Ambulation/Gait assistance: Min assist Gait Distance (Feet): 20 Feet Assistive device: None (husband) Gait Pattern/deviations: Step-through pattern, Decreased step length - right, Decreased step length - left, Shuffle Gait velocity: slowed Gait velocity interpretation: <1.31 ft/sec, indicative of household ambulator   General Gait Details: had pt demonstrate how she walks with her husband in the house. Husband reports that she has a RW but prefers that he hold her hands. Husband provides min A for steadying with ambulation to door and back        Balance Overall balance assessment: Needs assistance Sitting-balance support: Feet supported, No upper extremity supported       Standing balance support: During functional activity, Bilateral upper extremity supported, Reliant on assistive device for balance Standing balance-Leahy Scale: Fair Standing balance comment: husband provides balance assist in standing                             Pertinent Vitals/Pain Pain Assessment Pain Assessment: No/denies pain    Home Living Family/patient  expects to be discharged to:: Private residence Living Arrangements: Spouse/significant other;Children Available Help at Discharge: Family;Available 24 hours/day Type of Home: House Home Access: Stairs to enter Entrance Stairs-Rails: Doctor, general practice of Steps: 3   Home Layout:  One level Home Equipment: Agricultural consultant (2 wheels)      Prior Function Prior Level of Function : Needs assist             Mobility Comments: pt ambulates with husband HHA ADLs Comments: husband assists with iADLs, per pt independent with bathing and dressing     Extremity/Trunk Assessment   Upper Extremity Assessment Upper Extremity Assessment: Defer to OT evaluation    Lower Extremity Assessment Lower Extremity Assessment: RLE deficits/detail;Generalized weakness RLE Deficits / Details: R LE edema in comparison to L LE RLE Sensation: decreased light touch    Cervical / Trunk Assessment Cervical / Trunk Assessment: Kyphotic  Communication   Communication Communication: No apparent difficulties    Cognition Arousal: Alert Behavior During Therapy: WFL for tasks assessed/performed   PT - Cognitive impairments: History of cognitive impairments                       PT - Cognition Comments: oriented x3, thinks she is here for surgery, Following commands: Intact       Cueing Cueing Techniques: Verbal cues, Gestural cues, Tactile cues     General Comments General comments (skin integrity, edema, etc.): VSS on RA BP 122/65 in supine        Assessment/Plan    PT Assessment Patient does not need any further PT services         PT Goals (Current goals can be found in the Care Plan section)  Acute Rehab PT Goals PT Goal Formulation: With patient/family Time For Goal Achievement: 04/02/24 Potential to Achieve Goals: Fair     AM-PAC PT "6 Clicks" Mobility  Outcome Measure Help needed turning from your back to your side while in a flat bed without using bedrails?: A Little Help needed moving from lying on your back to sitting on the side of a flat bed without using bedrails?: A Little Help needed moving to and from a bed to a chair (including a wheelchair)?: A Little Help needed standing up from a chair using your arms (e.g., wheelchair or bedside  chair)?: A Little Help needed to walk in hospital room?: A Little Help needed climbing 3-5 steps with a railing? : A Little 6 Click Score: 18    End of Session   Activity Tolerance: Patient tolerated treatment well Patient left: in chair;with call bell/phone within reach;with chair alarm set Nurse Communication: Mobility status PT Visit Diagnosis: Muscle weakness (generalized) (M62.81);Unsteadiness on feet (R26.81)    Time: 4098-1191 PT Time Calculation (min) (ACUTE ONLY): 27 min   Charges:   PT Evaluation $PT Eval Moderate Complexity: 1 Mod PT Treatments $Therapeutic Activity: 8-22 mins PT General Charges $$ ACUTE PT VISIT: 1 Visit         Jacqualine Weichel B. Jewel Mortimer PT, DPT Acute Rehabilitation Services Please use secure chat or  Call Office [phone]   Verlie Glisson Fleet 03/19/2024, 11:59 AM

## 2024-03-20 ENCOUNTER — Other Ambulatory Visit (HOSPITAL_COMMUNITY): Payer: Self-pay

## 2024-03-20 DIAGNOSIS — I5023 Acute on chronic systolic (congestive) heart failure: Secondary | ICD-10-CM | POA: Diagnosis not present

## 2024-03-20 DIAGNOSIS — I2510 Atherosclerotic heart disease of native coronary artery without angina pectoris: Secondary | ICD-10-CM | POA: Diagnosis not present

## 2024-03-20 DIAGNOSIS — I10 Essential (primary) hypertension: Secondary | ICD-10-CM | POA: Diagnosis not present

## 2024-03-20 DIAGNOSIS — N1832 Chronic kidney disease, stage 3b: Secondary | ICD-10-CM | POA: Diagnosis not present

## 2024-03-20 LAB — BASIC METABOLIC PANEL WITH GFR
Anion gap: 10 (ref 5–15)
BUN: 41 mg/dL — ABNORMAL HIGH (ref 8–23)
CO2: 26 mmol/L (ref 22–32)
Calcium: 8.8 mg/dL — ABNORMAL LOW (ref 8.9–10.3)
Chloride: 101 mmol/L (ref 98–111)
Creatinine, Ser: 1.69 mg/dL — ABNORMAL HIGH (ref 0.44–1.00)
GFR, Estimated: 31 mL/min — ABNORMAL LOW (ref >60)
Glucose, Bld: 124 mg/dL — ABNORMAL HIGH (ref 70–99)
Potassium: 4.1 mmol/L (ref 3.5–5.1)
Sodium: 137 mmol/L (ref 135–145)

## 2024-03-20 LAB — GLUCOSE, CAPILLARY
Glucose-Capillary: 142 mg/dL — ABNORMAL HIGH (ref 70–99)
Glucose-Capillary: 205 mg/dL — ABNORMAL HIGH (ref 70–99)

## 2024-03-20 MED ORDER — TORSEMIDE 20 MG PO TABS
40.0000 mg | ORAL_TABLET | Freq: Every day | ORAL | 0 refills | Status: DC
  Filled 2024-03-20: qty 60, 30d supply, fill #0

## 2024-03-20 MED ORDER — SPIRONOLACTONE 25 MG PO TABS
25.0000 mg | ORAL_TABLET | Freq: Every day | ORAL | 0 refills | Status: DC
  Filled 2024-03-20: qty 30, 30d supply, fill #0

## 2024-03-20 MED ORDER — SACUBITRIL-VALSARTAN 24-26 MG PO TABS
1.0000 | ORAL_TABLET | Freq: Two times a day (BID) | ORAL | 0 refills | Status: DC
  Filled 2024-03-20: qty 60, 30d supply, fill #0

## 2024-03-20 MED ORDER — INSULIN GLARGINE 100 UNIT/ML ~~LOC~~ SOLN
20.0000 [IU] | Freq: Every day | SUBCUTANEOUS | 0 refills | Status: DC
  Filled 2024-03-20: qty 10, 28d supply, fill #0

## 2024-03-20 NOTE — Plan of Care (Signed)
  Problem: Education: Goal: Knowledge of General Education information will improve Description: Including pain rating scale, medication(s)/side effects and non-pharmacologic comfort measures Outcome: Adequate for Discharge   Problem: Health Behavior/Discharge Planning: Goal: Ability to manage health-related needs will improve Outcome: Adequate for Discharge   Problem: Clinical Measurements: Goal: Ability to maintain clinical measurements within normal limits will improve Outcome: Adequate for Discharge Goal: Will remain free from infection Outcome: Adequate for Discharge Goal: Diagnostic test results will improve Outcome: Adequate for Discharge Goal: Respiratory complications will improve Outcome: Adequate for Discharge Goal: Cardiovascular complication will be avoided Outcome: Adequate for Discharge   Problem: Activity: Goal: Risk for activity intolerance will decrease Outcome: Adequate for Discharge   Problem: Nutrition: Goal: Adequate nutrition will be maintained Outcome: Adequate for Discharge   Problem: Coping: Goal: Level of anxiety will decrease Outcome: Adequate for Discharge   Problem: Elimination: Goal: Will not experience complications related to bowel motility Outcome: Adequate for Discharge Goal: Will not experience complications related to urinary retention Outcome: Adequate for Discharge   Problem: Pain Managment: Goal: General experience of comfort will improve and/or be controlled Outcome: Adequate for Discharge   Problem: Safety: Goal: Ability to remain free from injury will improve Outcome: Adequate for Discharge   Problem: Skin Integrity: Goal: Risk for impaired skin integrity will decrease Outcome: Adequate for Discharge   Problem: Clinical Measurements: Goal: Ability to avoid or minimize complications of infection will improve Outcome: Adequate for Discharge   Problem: Skin Integrity: Goal: Skin integrity will improve Outcome: Adequate  for Discharge   Problem: Education: Goal: Ability to describe self-care measures that may prevent or decrease complications (Diabetes Survival Skills Education) will improve Outcome: Adequate for Discharge Goal: Individualized Educational Video(s) Outcome: Adequate for Discharge   Problem: Coping: Goal: Ability to adjust to condition or change in health will improve Outcome: Adequate for Discharge   Problem: Fluid Volume: Goal: Ability to maintain a balanced intake and output will improve Outcome: Adequate for Discharge   Problem: Health Behavior/Discharge Planning: Goal: Ability to identify and utilize available resources and services will improve Outcome: Adequate for Discharge Goal: Ability to manage health-related needs will improve Outcome: Adequate for Discharge   Problem: Metabolic: Goal: Ability to maintain appropriate glucose levels will improve Outcome: Adequate for Discharge   Problem: Nutritional: Goal: Maintenance of adequate nutrition will improve Outcome: Adequate for Discharge Goal: Progress toward achieving an optimal weight will improve Outcome: Adequate for Discharge   Problem: Skin Integrity: Goal: Risk for impaired skin integrity will decrease Outcome: Adequate for Discharge   Problem: Tissue Perfusion: Goal: Adequacy of tissue perfusion will improve Outcome: Adequate for Discharge   Problem: Education: Goal: Ability to demonstrate management of disease process will improve Outcome: Adequate for Discharge Goal: Ability to verbalize understanding of medication therapies will improve Outcome: Adequate for Discharge Goal: Individualized Educational Video(s) Outcome: Adequate for Discharge   Problem: Activity: Goal: Capacity to carry out activities will improve Outcome: Adequate for Discharge   Problem: Cardiac: Goal: Ability to achieve and maintain adequate cardiopulmonary perfusion will improve Outcome: Adequate for Discharge

## 2024-03-20 NOTE — Discharge Summary (Signed)
 Physician Discharge Summary   Patient: Kylie Ward MRN: [id number] DOB: Dec 09, 1945  Admit date:     03/16/2024  Discharge date: 03/20/24  Discharge Physician: Curlee Doss Efrain Clauson   PCP: Delfina Feller, FNP   Recommendations at discharge:    Patient has been placed on guideline directed medical therapy for heart failure with entresto , continue spironolactone , SGLT 2 inh and metoprolol .  Loop diuretic with torsemide  40 mg po daily  Decreased basal insulin  dose to prevent hypoglycemia.  Follow up renal function and electrolytes in 7 days as outpatient  Follow up with Dorian Gardener FNP in 7 to 10 days Follow up with Cardiology as scheduled.  Home health services with home PT OT and RN   I spoke with patient's husband and daughter at the bedside, we talked in detail about patient's condition, plan of care and prognosis and all questions were addressed.   Discharge Diagnoses: Principal Problem:   Acute on chronic systolic CHF (congestive heart failure) (HCC) Active Problems:   Coronary artery disease   Essential hypertension, benign   CKD stage 3b, GFR 30-44 ml/min (HCC)   Type 2 diabetes mellitus with diabetic polyneuropathy, with long-term current use of insulin  (HCC)   Dementia without behavioral disturbance (HCC)   Cellulitis of right lower extremity   History of CVA (cerebrovascular accident)  Resolved Problems:   * No resolved hospital problems. [facility] Course: Kylie Ward was admitted to the hospital with the working diagnosis of heart failure exacerbation.   78 yo female with the past medical history of dementia, coronary artery disease, heart failure, and peripheral vascular disease who presented with dyspnea. She was noted to have worsening dyspnea for the last 24 hrs prior to admission along with chest pain. She had persistent symptoms, prompting her husband to call EMS. She was found in respiratory distress, was placed on Bipap and transported  to the ED. On her initial physical examination her blood pressure was 160/65, HR 105, RR 25 and 02 saturation 96%.  Lungs with coarse breath sounds bilaterally, heart with S1 and S2 present and regular with no gallops, abdomen with no distention and no lower extremity edema.   VBG 7,34/ 38/148/22/ 99%  Na 136, K 4,4 Cl 102 bicarbonate 20, glucose 259 bun 26 cr 1,54  AST 22 and ALT 13  BNP 435  High sensitive troponin 102, 976   Lactic acid 1,4 and 1,3  Wbc 17, hgb 12.7 plt 339   Chest radiograph with mild cardiomegaly, bilateral hilar vascular congestion, bilateral central interstitial infiltrates, positive bilateral peripheral nodular opacities. No effusions.   EKG 81 bpm, left axis deviation, left bundle branch block, qtc 504, sinus rhythm with no significant ST segment or T wave changes.   05/18 improved volume status.  05/20 patient was placed on guideline directed medical therapy with good toleration, plan to follow up as outpatient.   Assessment and Plan: * Acute on chronic systolic CHF (congestive heart failure) (HCC) Echocardiogram with  mild reduction in LV systolic function with EF 40 to 45%, moderate asymmetric left ventricular hypertrophy of the septal segment. RV systolic function preserved. LA with mild to moderate dilatation, mild to moderate mitral valve regurgitation, mild mitral stenosis,   LV apical septal segment and apical inferior segment are akinetic. Mid anteroseptal segment and apical anterior segment are akinetic.   Patient was placed on IV furosemide  for diuresis,negative fluid balance was achieved with significant improvement in her symptoms.   Continue metoprolol , entresto , SGLT 2 inh  and spironolactone .  Loop diuretic with torsemide  40 mg po daily.   Acute cardiogenic pulmonary edema with pleural effusions clinically  improved with diuresis, at the time of her discharge her 02 saturation was 95% on room air.  Follow up CT chest was negative for suspicious  pulmonary nodule and had small bilateral pleural effusions.  CT   Coronary artery disease Positive LV wall motion abnormalities.  Continue clopidogrel  and statin Elevation in high sensitive troponin due to heart failure, no acute coronary syndrome.   Essential hypertension, benign Continue blood pressure monitoring  Continue metoprolol .   CKD stage 3b, GFR 30-44 ml/min (HCC) AKI At the time of her discharge her serum cr is 1,69 with K at 4,1 and serum bicarbonate at 26  Na 137   Continue diuresis with torsemide , SGLT 2 inh and spironolactone   Follow up renal function and electrolytes in 7 days as outpatient.   Type 2 diabetes mellitus with diabetic polyneuropathy, with long-term current use of insulin  (HCC) Continue glucose cover and monitoring with insulin  sliding scale.  Basal insulin , was reduced dose in 50% to avoid hypoglycemia.  Fasting glucose today is 124 mg/dl    Dementia without behavioral disturbance (HCC) Continue with memantine   and donepezil .  Escitalopram .  As needed alprazolam  for anxiety.   Cellulitis of right lower extremity Patient has been treated with antibiotic therapy for one month as outpatient.,  It has been improving.  Likely component of peripheral vascular disease.  Wbc is down to 13,   Continue to hold on further antibiotic therapy and continue close monitoring.    History of CVA (cerebrovascular accident) Continue blood pressure control, statin and clopidogrel          Consultants: cardiology  Procedures performed: none   Disposition: Home Diet recommendation:  Cardiac and Carb modified diet DISCHARGE MEDICATION: Allergies as of 03/20/2024       Reactions   Crestor  [rosuvastatin ] Other (See Comments)   Myalgias and arthralgias to the point she had difficulty walking. She was tolerant of lipitor from the family's recall of events.    Niaspan [niacin Er (antihyperlipidemic)] Itching   Skin burning        Medication List      STOP taking these medications    fenofibrate  160 MG tablet   furosemide  40 MG tablet Commonly known as: LASIX    lisinopril  20 MG tablet Commonly known as: ZESTRIL    One-A-Day Womens 50+ Tabs       TAKE these medications    Accu-Chek FastClix Lancets Misc CHECK BLOOD SUGAR UP TO 3 TIMES A DAY AS DIRECTED Dx E11.42   Accu-Chek SmartView test strip Generic drug: glucose blood CHECK BLOOD SUGAR UP TO 3 TIMES A DAY AS DIRECTED   ALPRAZolam  1 MG tablet Commonly known as: XANAX  Take 1 tablet (1 mg total) by mouth 2 (two) times daily.   atorvastatin  40 MG tablet Commonly known as: LIPITOR Take 1 tablet (40 mg total) by mouth daily.   clopidogrel  75 MG tablet Commonly known as: PLAVIX  Take 1 tablet (75 mg total) by mouth daily.   dapagliflozin  propanediol 10 MG Tabs tablet Commonly known as: Farxiga  Take 1 tablet (10 mg total) by mouth daily before breakfast.   donepezil  10 MG tablet Commonly known as: ARICEPT  Take 1 tablet (10 mg total) by mouth at bedtime.   escitalopram  20 MG tablet Commonly known as: LEXAPRO  Take 1 tablet (20 mg total) by mouth daily.   FreeStyle Libre 2 Sensor Misc every 14 (fourteen) days.  insulin  aspart 100 UNIT/ML injection Commonly known as: novoLOG  Inject 0-10 Units into the skin See admin instructions. 0-10 units per sliding scale, three times daily before meals.   insulin  glargine 100 UNIT/ML injection Commonly known as: Lantus  Inject 0.2 mLs (20 Units total) into the skin daily. What changed: how much to take   memantine  10 MG tablet Commonly known as: NAMENDA  Take 1 tablet (10 mg total) by mouth 2 (two) times daily.   metFORMIN  750 MG 24 hr tablet Commonly known as: GLUCOPHAGE -XR Take 1 tablet (750 mg total) by mouth daily with breakfast.   metoprolol  succinate 25 MG 24 hr tablet Commonly known as: TOPROL -XL Take 1 tablet (25 mg total) by mouth daily.   polyethylene glycol powder 17 GM/SCOOP powder Commonly known as:  GLYCOLAX /MIRALAX  Take 17 g by mouth daily. What changed:  when to take this reasons to take this   sacubitril -valsartan  24-26 MG Commonly known as: ENTRESTO  Take 1 tablet by mouth 2 (two) times daily.   spironolactone  25 MG tablet Commonly known as: ALDACTONE  Take 1 tablet (25 mg total) by mouth daily. Start taking on: Mar 21, 2024   Sure Comfort Pen Needles 31G X 8 MM Misc Generic drug: Insulin  Pen Needle USE TO inject lantus  ONCE daily   torsemide  20 MG tablet Commonly known as: DEMADEX  Take 2 tablets (40 mg total) by mouth daily. Start taking on: Mar 21, 2024   traZODone  50 MG tablet Commonly known as: DESYREL  Take 1 tablet (50 mg total) by mouth at bedtime.        Follow-up Information     [facility] Follow up on 04/06/2024.   Specialty: Cardiology Why: at 2:30pm [facility] Contact information: [address] [place]  [address] [phone]        Delfina Feller, FNP Follow up.   Specialty: Family Medicine Why: please call to schedule hospital follow up in 7-14 days Contact information: [address] [place] [address] [phone]                Discharge Exam: Cleavon Curls Weights   03/16/24 2003 03/17/24 0350 03/18/24 0356  Weight: 72.5 kg 70.7 kg 72.1 kg   BP (!) 105/50 (BP Location: Right Arm)   Pulse (!) 56   Temp 98.2 F (36.8 C) (Oral)   Resp 16   Ht 5' 2.01" (1.575 m)   Wt 72.1 kg   LMP 01/27/1983   SpO2 90%   BMI 29.07 kg/m   Neurology awake and alert, deconditioned ENT with mild pallor with no icterus Cardiovascular with S1 and S2 present and regular with no gallops rubs or murmurs Respiratory with no rales or wheezing, no rhonchi on anterior auscultation  Abdomen with no distention  No ankle edema, positive component of lymphedema   Condition at discharge: stable  The results of significant diagnostics from this hospitalization  (including imaging, microbiology, ancillary and laboratory) are listed below for reference.   Imaging Studies: ECHOCARDIOGRAM COMPLETE Result Date: 03/19/2024    ECHOCARDIOGRAM REPORT   Patient Name:   LARHONDA DETTLOFF Date of Exam: 03/19/2024 Medical Rec #:  [id number]         Height:       62.0 in Accession #:    [id number]        Weight:       159.0 lb Date of Birth:  08/13/1946         BSA:  1.734 m Patient Age:    77 years          BP:           108/59 mmHg Patient Gender: F                 HR:           60 bpm. Exam Location:  Inpatient Procedure: 2D Echo, Color Doppler and Cardiac Doppler (Both Spectral and Color            Flow Doppler were utilized during procedure). Indications:    I50.23 Acute on chronic systolic (congestive) heart failure  History:        Patient has prior history of Echocardiogram examinations. CHF.  Sonographer:    Hersey Lorenzo Referring Phys: [id number] [place] LEE IMPRESSIONS  1. Technically difficult; LV function appears to be moderately reduced on definity  images.  2. Left ventricular ejection fraction, by estimation, is 35 to 40%. The left ventricle has moderately decreased function. The left ventricle demonstrates global hypokinesis. There is severe left ventricular hypertrophy. Left ventricular diastolic parameters are indeterminate. Elevated left atrial pressure.  3. Right ventricular systolic function is normal. The right ventricular size is normal.  4. The mitral valve is normal in structure. Mild mitral valve regurgitation. No evidence of mitral stenosis. Severe mitral annular calcification.  5. The aortic valve is normal in structure. Aortic valve regurgitation is not visualized. No aortic stenosis is present.  6. The inferior vena cava is normal in size with greater than 50% respiratory variability, suggesting right atrial pressure of 3 mmHg. FINDINGS  Left Ventricle: Left ventricular ejection fraction, by estimation, is 35 to 40%. The left ventricle has moderately  decreased function. The left ventricle demonstrates global hypokinesis. Definity  contrast agent was given IV to delineate the left ventricular endocardial borders. The left ventricular internal cavity size was normal in size. There is severe left ventricular hypertrophy. Abnormal (paradoxical) septal motion, consistent with left bundle branch block. Left ventricular diastolic parameters are indeterminate. Elevated left atrial pressure. Right Ventricle: The right ventricular size is normal. Right ventricular systolic function is normal. Left Atrium: Left atrial size was normal in size. Right Atrium: Right atrial size was normal in size. Pericardium: There is no evidence of pericardial effusion. Mitral Valve: The mitral valve is normal in structure. Severe mitral annular calcification. Mild mitral valve regurgitation. No evidence of mitral valve stenosis. Tricuspid Valve: The tricuspid valve is normal in structure. Tricuspid valve regurgitation is not demonstrated. No evidence of tricuspid stenosis. Aortic Valve: The aortic valve is normal in structure. Aortic valve regurgitation is not visualized. No aortic stenosis is present. Pulmonic Valve: The pulmonic valve was not well visualized. Pulmonic valve regurgitation is not visualized. No evidence of pulmonic stenosis. Aorta: The aortic root is normal in size and structure. Venous: The inferior vena cava is normal in size with greater than 50% respiratory variability, suggesting right atrial pressure of 3 mmHg. IAS/Shunts: The interatrial septum was not well visualized. Additional Comments: Technically difficult; LV function appears to be moderately reduced on definity  images.  LEFT VENTRICLE PLAX 2D LVIDd:         4.90 cm   Diastology LVIDs:         3.30 cm   LV e' medial:    3.92 cm/s LV PW:         1.50 cm   LV E/e' medial:  36.2 LV IVS:        1.50 cm   LV e'  lateral:   7.29 cm/s LVOT diam:     2.30 cm   LV E/e' lateral: 19.5 LV SV:         70 LV SV Index:   40  LVOT Area:     4.15 cm  IVC IVC diam: 1.00 cm LEFT ATRIUM           Index LA diam:      4.50 cm 2.60 cm/m LA Vol (A4C): 44.5 ml 25.67 ml/m  AORTIC VALVE LVOT Vmax:   66.90 cm/s LVOT Vmean:  43.400 cm/s LVOT VTI:    0.169 m  AORTA Ao Root diam: 2.90 cm Ao Asc diam:  3.40 cm MITRAL VALVE MV Area (PHT): 2.17 cm     SHUNTS MV Decel Time: 350 msec     Systemic VTI:  0.17 m MV E velocity: 142.00 cm/s  Systemic Diam: 2.30 cm MV A velocity: 150.00 cm/s MV E/A ratio:  0.95 Alexandria Angel MD Electronically signed by Alexandria Angel MD Signature Date/Time: 03/19/2024/6:15:40 PM    Final    CT CHEST WO CONTRAST Result Date: 03/19/2024 CLINICAL DATA:  Lung nodules EXAM: CT CHEST WITHOUT CONTRAST TECHNIQUE: Multidetector CT imaging of the chest was performed following the standard protocol without IV contrast. RADIATION DOSE REDUCTION: This exam was performed according to the departmental dose-optimization program which includes automated exposure control, adjustment of the mA and/or kV according to patient size and/or use of iterative reconstruction technique. COMPARISON:  Chest x-ray 03/16/2024, chest CT 04/07/2023 FINDINGS: Cardiovascular: Limited evaluation without intravenous contrast. Moderate aortic atherosclerosis. No aneurysm. Coronary vascular calcification. Mitral calcification. No pericardial effusion. Mediastinum/Nodes: Midline trachea. No thyroid  mass. Subcentimeter mediastinal lymph nodes. Esophagus within normal limits Lungs/Pleura: Small bilateral pleural effusions. No acute airspace disease. No suspicious pulmonary nodules. Calcified granuloma in the right middle lobe. Upper Abdomen: No acute finding Musculoskeletal: No acute or suspicious osseous abnormality IMPRESSION: 1. Negative for suspicious pulmonary nodule 2. Small bilateral pleural effusions Aortic Atherosclerosis (ICD10-I70.0). Electronically Signed   By: Esmeralda Hedge M.D.   On: 03/19/2024 17:30   DG Chest Port 1 View Result Date:  03/16/2024 CLINICAL DATA:  Shortness of breath. EXAM: PORTABLE CHEST 1 VIEW COMPARISON:  April 07, 2023. FINDINGS: Stable cardiomediastinal silhouette. Interstitial densities are noted throughout both lungs concerning for pulmonary edema. Also noted are multiple possible bilateral pulmonary nodules. Bony thorax is unremarkable. IMPRESSION: Bilateral interstitial densities are noted concerning for pulmonary edema. Multiple possible pulmonary nodules are also noted concerning for metastatic disease. CT scan of the chest is recommended for further evaluation. Electronically Signed   By: Rosalene Colon M.D.   On: 03/16/2024 14:32    Microbiology: Results for orders placed or performed during the hospital encounter of 03/16/24  Blood culture (routine x 2)     Status: None (Preliminary result)   Collection Time: 03/16/24  3:34 PM   Specimen: BLOOD  Result Value Ref Range Status   Specimen Description BLOOD SITE NOT SPECIFIED  Final   Special Requests   Final    BOTTLES DRAWN AEROBIC AND ANAEROBIC Blood Culture results may not be optimal due to an inadequate volume of blood received in culture bottles   Culture   Final    NO GROWTH 4 DAYS Performed at [facility], [address], [place], [place] [address]    Report Status PENDING  Incomplete  Blood culture (routine x 2)     Status: None (Preliminary result)   Collection Time: 03/16/24  6:17 PM  Specimen: BLOOD  Result Value Ref Range Status   Specimen Description BLOOD SITE NOT SPECIFIED  Final   Special Requests   Final    BOTTLES DRAWN AEROBIC AND ANAEROBIC Blood Culture adequate volume   Culture   Final    NO GROWTH 4 DAYS Performed at [facility], [address] [place], [place] [address]    Report Status PENDING  Incomplete    Labs: CBC: Recent Labs  Lab 03/16/24 1441 03/16/24 1457 03/17/24 0356  WBC 17.0*  --  13.0*  NEUTROABS 15.2*  --   --   HGB 12.7 12.6 11.3*  HCT 39.0 37.0 33.8*  MCV 89.0  --  87.8  PLT  339  --  274   Basic Metabolic Panel: Recent Labs  Lab 03/16/24 1441 03/16/24 1457 03/17/24 0356 03/18/24 0433 03/19/24 0525 03/20/24 0556  NA 136 136 137 137 136 137  K 4.4 4.3 3.7 4.4 4.4 4.1  CL 102  --  102 104 102 101  CO2 20*  --  26 25 25 26   GLUCOSE 259*  --  220* 131* 111* 124*  BUN 26*  --  23 31* 38* 41*  CREATININE 1.54*  --  1.47* 1.99* 1.74* 1.69*  CALCIUM  8.9  --  8.5* 8.4* 8.4* 8.8*  MG  --   --   --  1.9  --   --    Liver Function Tests: Recent Labs  Lab 03/16/24 1441  AST 22  ALT 13  ALKPHOS 76  BILITOT 0.8  PROT 6.6  ALBUMIN 3.4*   CBG: Recent Labs  Lab 03/19/24 1159 03/19/24 1822 03/19/24 2040 03/20/24 0733 03/20/24 1129  GLUCAP 116* 117* 201* 142* 205*    Discharge time spent: greater than 30 minutes.  Signed: Albertus Alt, MD [facility] 03/20/2024

## 2024-03-20 NOTE — Progress Notes (Signed)
 Mobility Specialist Progress Note;   03/20/24 1025  Mobility  Activity Transferred from chair to bed  Level of Assistance Minimal assist, patient does 75% or more  Assistive Device Other (Comment) (HHA)  Activity Response Tolerated fair  Mobility Referral Yes  Mobility visit 1 Mobility  Mobility Specialist Start Time (ACUTE ONLY) 1025  Mobility Specialist Stop Time (ACUTE ONLY) 1032  Mobility Specialist Time Calculation (min) (ACUTE ONLY) 7 min   Family transferring pt to bed upon arrival, assisted family in doing so. Required MinA +2 w/ family member to transfer pt safely. Assisted readjusting pt in bed w/ use of bed pads. Family states pt is too sleepy to ambulate at this time and should be discharging today. Pt left comfortably in bed with all needs met. Family present.   Janit Meline Mobility Specialist Please contact via SecureChat or [employer] [phone]

## 2024-03-20 NOTE — TOC Transition Note (Signed)
 Transition of Care [facility]) - Discharge Note   Patient Details  Name: Kylie Ward MRN: [id number] Date of Birth: 05-Feb-1946  Transition of Care [facility]) CM/SW Contact:  Cosimo Diones, RN Phone Number: 03/20/2024, 1:15 PM   Clinical Narrative:  Case Manager spoke with patient regarding home health needs PT/OT/RN. Patient spouse was in the room during the conversation and he states the patient will not need any additional services in the home. Spouse states that if patient needs services he can go through the [place] to get them arranged. No home care services set up at this time.  Spouse to provide transportation home.   Final next level of care: Home/Self Care Barriers to Discharge: No Barriers Identified   Patient Goals and CMS Choice Patient states their goals for this hospitalization and ongoing recovery are:: patient to return home with spouse.   Choice offered to / list presented to : NA  Discharge Plan and Services Additional resources added to the After Visit Summary for     Discharge Planning Services: CM Consult Post Acute Care Choice: NA            DME Agency: NA     HH Arranged: Refused HH   Social Drivers of Health (SDOH) Interventions SDOH Screenings   Food Insecurity: No Food Insecurity (03/16/2024)  Housing: Unknown (03/16/2024)  Transportation Needs: Patient Unable To Answer (03/16/2024)  Utilities: Not At Risk (03/16/2024)  Alcohol Screen: Low Risk  (05/07/2021)  Depression (PHQ2-9): Low Risk  (10/21/2023)  Recent Concern: Depression (PHQ2-9) - High Risk (09/05/2023)  Financial Resource Strain: Low Risk  (05/10/2022)  Physical Activity: Inactive (05/10/2022)  Social Connections: Patient Declined (03/16/2024)  Stress: No Stress Concern Present (05/10/2022)  Tobacco Use: Medium Risk (02/24/2024)   Readmission Risk Interventions     No data to display

## 2024-03-21 ENCOUNTER — Telehealth: Payer: Self-pay | Admitting: *Deleted

## 2024-03-21 LAB — CULTURE, BLOOD (ROUTINE X 2)
Culture: NO GROWTH
Culture: NO GROWTH
Special Requests: ADEQUATE

## 2024-03-21 NOTE — Transitions of Care (Post Inpatient/ED Visit) (Signed)
   03/21/2024  Name: Kylie Ward MRN: [id number] DOB: 1946-07-23  Today's TOC FU Call Status: Today's TOC FU Call Status:: Unsuccessful Call (1st Attempt) Unsuccessful Call (1st Attempt) Date: 03/21/24  Attempted to reach the patient regarding the most recent Inpatient/ED visit. Female answered the telephone and states pt is asleep, requests call back another day.  Follow Up Plan: Additional outreach attempts will be made to reach the patient to complete the Transitions of Care (Post Inpatient/ED visit) call.   Cecilie Coffee [facility], BSN RN Care Manager/ Transition of Care [place]/ [facility] [phone]

## 2024-03-22 ENCOUNTER — Telehealth: Payer: Self-pay | Admitting: *Deleted

## 2024-03-22 NOTE — Transitions of Care (Post Inpatient/ED Visit) (Addendum)
   03/22/2024  Name: Kylie Ward MRN: [id number] DOB: July 10, 1946  Today's TOC FU Call Status: Today's TOC FU Call Status:: Unsuccessful Call (2nd Attempt) Unsuccessful Call (2nd Attempt) Date: 03/22/24  Attempted to reach the patient regarding the most recent Inpatient/ED visit.  Follow Up Plan: Additional outreach attempts will be made to reach the patient to complete the Transitions of Care (Post Inpatient/ED visit) call.   Patient is not available to talk at present.  Cecilie Coffee [facility], BSN RN Care Manager/ Transition of Care [place]/ [facility] [phone]

## 2024-03-23 ENCOUNTER — Telehealth: Payer: Self-pay | Admitting: *Deleted

## 2024-03-23 NOTE — Transitions of Care (Post Inpatient/ED Visit) (Signed)
 03/23/2024  Name: Kylie Ward MRN: [id number] DOB: 09-Jan-1946  Today's TOC FU Call Status: Today's TOC FU Call Status:: Successful TOC FU Call Completed TOC FU Call Complete Date: 03/23/24 Patient's Name and Date of Birth confirmed.  Transition Care Management Follow-up Telephone Call Date of Discharge: 03/20/24 Discharge Facility: [facility]) Type of Discharge: Inpatient Admission Primary Inpatient Discharge Diagnosis:: Acute on chronic CHF How have you been since you were released from the hospital?: Better (per spouse: "She is doing fine and not having any problems.  All is good.  I am sitting down and can't go get all of her medications to review- we have been over everything and know what to do.  Don't need regular phone calls, she is doing fine") Any questions or concerns?: No  Items Reviewed: Did you receive and understand the discharge instructions provided?: Yes (briefly reviewed with patient's spouse who verbalizes fair understanding of same - declines thorough review) Medications obtained,verified, and reconciled?: No Medications Not Reviewed Reasons:: Other: (spouse/ caregiver declined medication review today; confirmed patient obtained/ is taking all newly Rx'd medications as instructed; spouse-manages medications and denies questions/ concerns around medications today) Any new allergies since your discharge?: No Dietary orders reviewed?: Yes Type of Diet Ordered:: "as good as we can" Do you have support at home?: Yes People in Home [RPT]: spouse Name of Support/Comfort Primary Source: Reports independent in self-care activities; supportive spouse assists as/ if needed/ indicated  Medications Reviewed Today: Medications Reviewed Today     Reviewed by Yasmene Salomone M, RN (Registered Nurse) on 03/23/24 at 1607  Med List Status: <None>   Medication Order Taking? Sig Documenting Provider Last Dose Status Informant  Accu-Chek FastClix Lancets MISC [id number] No  CHECK BLOOD SUGAR UP TO 3 TIMES A DAY AS DIRECTED Dx N82.95 Delfina Feller, FNP Taking Active Spouse/Significant Other, Pharmacy Records, Self  ALPRAZolam  (XANAX ) 1 MG tablet [id number] No Take 1 tablet (1 mg total) by mouth 2 (two) times daily. Delfina Feller, FNP 03/15/2024 Active Self, Spouse/Significant Other, Pharmacy Records  atorvastatin  (LIPITOR) 40 MG tablet [id number] No Take 1 tablet (40 mg total) by mouth daily. Delfina Feller, FNP 03/15/2024 Active Self, Spouse/Significant Other, Pharmacy Records  clopidogrel  (PLAVIX ) 75 MG tablet [id number] No Take 1 tablet (75 mg total) by mouth daily. Delfina Feller, FNP 03/15/2024 Active Self, Spouse/Significant Other, Pharmacy Records  Continuous Glucose Sensor (FREESTYLE LIBRE 2 SENSOR) MISC [id number] No every 14 (fourteen) days.  Patient not taking: Reported on 03/16/2024   [provider] Not Taking Active Self, Spouse/Significant Other, Pharmacy Records  dapagliflozin  propanediol (FARXIGA ) 10 MG TABS tablet [id number] No Take 1 tablet (10 mg total) by mouth daily before breakfast. Delfina Feller, FNP 03/15/2024 Active Self, Spouse/Significant Other, Pharmacy Records  donepezil  (ARICEPT ) 10 MG tablet [id number] No Take 1 tablet (10 mg total) by mouth at bedtime. Delfina Feller, FNP 03/15/2024 Active Self, Spouse/Significant Other, Pharmacy Records  escitalopram  (LEXAPRO ) 20 MG tablet [id number] No Take 1 tablet (20 mg total) by mouth daily. Delfina Feller, FNP 03/15/2024 Active Self, Spouse/Significant Other, Pharmacy Records  glucose blood (ACCU-CHEK SMARTVIEW) test strip [id number] No CHECK BLOOD SUGAR UP TO 3 TIMES A DAY AS DIRECTED Delfina Feller, FNP Taking Active Spouse/Significant Other, Pharmacy Records, Self  insulin  aspart (NOVOLOG ) 100 UNIT/ML injection [id number] No Inject 0-10 Units into the skin See admin instructions. 0-10 units per sliding scale, three times daily before meals. Delfina Feller, FNP 03/15/2024 Active Self, Spouse/Significant Other, Pharmacy Records  insulin  glargine (LANTUS ) 100 UNIT/ML  injection [id number]  Inject 0.2 mLs (20 Units total) into the skin daily. Arrien, Curlee Doss, MD  Active   Insulin  Pen Needle (SURE COMFORT PEN NEEDLES) 31G X 8 MM MISC [id number] No USE TO inject lantus  ONCE daily Delfina Feller, FNP Taking Active Spouse/Significant Other, Pharmacy Records, Self  memantine  (NAMENDA ) 10 MG tablet [id number] No Take 1 tablet (10 mg total) by mouth 2 (two) times daily. Delfina Feller, FNP 03/15/2024 Active Self, Spouse/Significant Other, Pharmacy Records  metFORMIN  (GLUCOPHAGE -XR) 750 MG 24 hr tablet [id number] No Take 1 tablet (750 mg total) by mouth daily with breakfast. Delfina Feller, FNP 03/15/2024 Active Self, Spouse/Significant Other, Pharmacy Records  metoprolol  succinate (TOPROL -XL) 25 MG 24 hr tablet [id number] No Take 1 tablet (25 mg total) by mouth daily. Delfina Feller, FNP 03/15/2024 Active Self, Spouse/Significant Other, Pharmacy Records  polyethylene glycol powder (GLYCOLAX /MIRALAX ) 17 GM/SCOOP powder [id number] No Take 17 g by mouth daily.  Patient taking differently: Take 17 g by mouth daily as needed for moderate constipation.   Galvin Jules, FNP Unknown Active Self, Spouse/Significant Other, Pharmacy Records  sacubitril -valsartan  (ENTRESTO ) 24-26 MG [id number]  Take 1 tablet by mouth 2 (two) times daily. Arrien, Curlee Doss, MD  Active   spironolactone  (ALDACTONE ) 25 MG tablet [id number]  Take 1 tablet (25 mg total) by mouth daily. Arrien, Curlee Doss, MD  Active   torsemide  (DEMADEX ) 20 MG tablet [id number]  Take 2 tablets (40 mg total) by mouth daily. Arrien, Curlee Doss, MD  Active   traZODone  (DESYREL ) 50 MG tablet [id number] No Take 1 tablet (50 mg total) by mouth at bedtime. Delfina Feller, FNP 03/15/2024 Active Self, Spouse/Significant Other, Pharmacy Records           Home  Care and Equipment/Supplies: Were Home Health Services Ordered?: No (home health services were recommended- patient/ spouse declined) Any new equipment or medical supplies ordered?: No  Functional Questionnaire: Do you need assistance with bathing/showering or dressing?: No Do you need assistance with meal preparation?: No Do you need assistance with eating?: No Do you have difficulty maintaining continence: No Do you need assistance with getting out of bed/getting out of a chair/moving?: No Do you have difficulty managing or taking your medications?: Yes (spouse reports he manages all of patient's medications)  Follow up appointments reviewed: PCP Follow-up appointment confirmed?: Yes (care coordination outreach in real-time with scheduling care guide to successfully schedule hospital follow up PCP appointment 03/30/24) Date of PCP follow-up appointment?: 03/30/24 Follow-up Provider: PCP Specialist Hospital Follow-up appointment confirmed?: Yes Date of Specialist follow-up appointment?: 03/28/24 Follow-Up Specialty Provider:: Vascular provider Do you need transportation to your follow-up appointment?: No Do you understand care options if your condition(s) worsen?: Yes-patient verbalized understanding  SDOH Interventions Today    Flowsheet Row Most Recent Value  SDOH Interventions   Food Insecurity Interventions Intervention Not Indicated  Housing Interventions Intervention Not Indicated  Transportation Interventions Intervention Not Indicated  [spouse reports patient's daughter provides all transportation]  Utilities Interventions Intervention Not Indicated      Patient's spouse/ caregiver declines need for ongoing/ further care management/ coordination outreach; declines enrollment in 30-day TOC program- declines taking my direct phone number should needs/ concerns arise post-TOC call   See TOC assessment tabs for additional assessment/ TOC intervention information  Pls call/  message for questions,  Javona Bergevin Mckinney Travarus Trudo, RN, BSN, Media planner  Transitions of Care  [facility] Population [facility]   [phone]: direct office

## 2024-03-28 ENCOUNTER — Ambulatory Visit: Admitting: Vascular Surgery

## 2024-03-30 ENCOUNTER — Encounter: Payer: Self-pay | Admitting: Nurse Practitioner

## 2024-03-30 ENCOUNTER — Ambulatory Visit: Admitting: Nurse Practitioner

## 2024-03-30 VITALS — BP 115/67 | HR 56 | Temp 97.3°F

## 2024-03-30 DIAGNOSIS — I5023 Acute on chronic systolic (congestive) heart failure: Secondary | ICD-10-CM

## 2024-03-30 DIAGNOSIS — Z789 Other specified health status: Secondary | ICD-10-CM

## 2024-03-30 DIAGNOSIS — I5043 Acute on chronic combined systolic (congestive) and diastolic (congestive) heart failure: Secondary | ICD-10-CM

## 2024-03-30 NOTE — Progress Notes (Signed)
 Subjective:    Patient ID: Kylie Ward, female    DOB: 22-Sep-1946, 78 y.o.   MRN: [id number]  Today's visit was for Transitional Care Management.  The patient was discharged from [facility] on 03/20/22 with a primary diagnosis of Acute on chronic CHF.   Contact with the patient and/or caregiver, by a clinical staff member, was made on 03/23/24 and was documented as a telephone encounter within the EMR.  Through chart review and discussion with the patient I have determined that management of their condition is of high complexity.    Since being discharged husband says patient is much better. Denies any SOB or chest pain. No peripheral edema.  Discharge directions  Patient has been placed on guideline directed medical therapy for heart failure with entresto , continue spironolactone , SGLT 2 inh and metoprolol .  Loop diuretic with torsemide  40 mg po daily  Decreased basal insulin  dose to prevent hypoglycemia.  Follow up renal function and electrolytes in 7 days as outpatient  Follow up with Dorian Gardener FNP in 7 to 10 days Follow up with Cardiology as scheduled.  Home health services with home PT OT and RN       Review of Systems  Constitutional:  Negative for diaphoresis.  Eyes:  Negative for pain.  Respiratory:  Negative for shortness of breath.   Cardiovascular:  Negative for chest pain, palpitations and leg swelling.  Gastrointestinal:  Negative for abdominal pain.  Endocrine: Negative for polydipsia.  Skin:  Negative for rash.  Neurological:  Negative for dizziness, weakness and headaches.  Hematological:  Does not bruise/bleed easily.  All other systems reviewed and are negative.      Objective:   Physical Exam Vitals and nursing note reviewed.  Constitutional:      General: She is not in acute distress.    Appearance: Normal appearance. She is well-developed.  Neck:     Vascular: No carotid bruit or JVD.  Cardiovascular:     Rate and Rhythm: Normal  rate and regular rhythm.     Heart sounds: Normal heart sounds.  Pulmonary:     Effort: Pulmonary effort is normal. No respiratory distress.     Breath sounds: Normal breath sounds. No wheezing or rales.  Chest:     Chest wall: No tenderness.  Abdominal:     General: Bowel sounds are normal. There is no distension or abdominal bruit.     Palpations: Abdomen is soft. There is no hepatomegaly, splenomegaly, mass or pulsatile mass.     Tenderness: There is no abdominal tenderness.  Musculoskeletal:        General: Normal range of motion.     Cervical back: Normal range of motion and neck supple.  Lymphadenopathy:     Cervical: No cervical adenopathy.  Skin:    General: Skin is warm and dry.  Neurological:     Mental Status: She is alert and oriented to person, place, and time.     Deep Tendon Reflexes: Reflexes are normal and symmetric.  Psychiatric:        Behavior: Behavior normal.        Thought Content: Thought content normal.        Judgment: Judgment normal.    BP 115/67   Pulse (!) 56   Temp (!) 97.3 F (36.3 C) (Temporal)   LMP 01/27/1983   SpO2 91%         Assessment & Plan:   [name] in today with chief complaint  of Transitions Of Care   1. Transition of care (Primary) Hospital records reviewed  2. CHF NYHA class I (no symptoms from ordinary activities), acute on chronic, combined (HCC) Limit fluid intake Repeat labs today Continue all current meds Keep follow up appointment with cardiology Keep follow up appt with PCP as scheduled    The above assessment and management plan was discussed with the patient. The patient verbalized understanding of and has agreed to the management plan. Patient is aware to call the clinic if symptoms persist or worsen. Patient is aware when to return to the clinic for a follow-up visit. Patient educated on when it is appropriate to go to the emergency department.   [name], FNP

## 2024-03-30 NOTE — Patient Instructions (Signed)
 Heart Failure: Eating Plan Heart failure is a long-term condition where the heart can't pump enough blood through the body. When this happens, parts of the body don't get the blood and oxygen they need. Living with heart failure can be hard. But a healthy lifestyle and choosing the right foods may help to improve your symptoms. If you have heart failure, your eating plan will include limiting the amount of salt, also called sodium, and unhealthy fats you eat. What are tips for following this plan? Reading food labels Check food labels for the amount of sodium per serving. Choose foods that have less than 140 mg (milligrams) of sodium in each serving. Check food labels for the number of calories per serving. This is important if you need to limit your daily calorie intake to lose weight. Check food labels for the serving size. If you eat more than one serving, you'll be eating more sodium and calories than what's listed on the label. Look for foods with the words "sodium-free," "very low sodium," or "low sodium" on the package. Foods labeled as "reduced sodium," "lightly salted," or "no salt added" may still have more sodium than what's recommended for you. Cooking Avoid adding salt when cooking. Before using any salt substitutes, talk with your health care provider or an expert in healthy eating called a dietitian. Season food with salt-free seasonings, spices, or herbs. Check the label of seasoning mixes to make sure they don't contain salt. Cook with heart-healthy oils, such as olive, canola, soybean, or sunflower oil. Do not fry foods. Cook foods using low-fat methods, like baking, boiling, grilling, and broiling. Limit unhealthy fats when cooking. To do this: Remove the skin from poultry, such as chicken. Remove all the fat you can see on meats. Skim the fat off from stews, soups, and gravies before serving them. Meal planning  Limit your intake of: Processed, canned, or prepackaged  foods. Foods that are high in trans fat, such as fried foods. Sweets, desserts, sugary drinks, and other foods with added sugar. Full-fat dairy products, such as whole milk. Eat a balanced diet. This may include: 4-5 servings of fruit each day and 4-5 servings of vegetables each day. At each meal, try to fill one-half of your plate with fruits and vegetables. Up to 6-8 servings of whole grains each day. Up to 2 servings of lean meat, poultry, or fish each day. One serving of meat is equal to 3 oz (85 g). This is about the same size as a deck of cards. 2 servings of low-fat dairy each day. Heart-healthy fats. Healthy fats called omega-3 fatty acids are a good choice. They're found in foods such as flaxseed and cold-water fish like sardines, salmon, and mackerel. Aim to eat 25-35 g (grams) of fiber a day. Foods that are high in fiber include apples, broccoli, carrots, beans, peas, and whole grains. Do not add salt or condiments that contain salt (such as soy sauce) to foods before eating. When eating at a restaurant, ask that your food be prepared with less salt or no salt, if possible. Try to eat 2 or more plant-based or meat-free meals each week. Cook more meals at home and eat less at restaurants, buffets, and fast food places. General information Do not eat more than 2,300 mg of sodium a day. The amount of sodium that's recommended for you may be lower, depending on your condition. Stay at a healthy weight as told. Ask your provider what a healthy weight is for you. Check  your weight every day. Work with your provider and dietitian to make a plan that will help you lose weight or stay at your current weight. Limit how much fluid you drink. Ask your provider or dietitian how much fluid you can have each day. Limit or avoid alcohol as told. Recommended foods Fruits All fresh, frozen, and canned fruits. Dried fruits, such as raisins, prunes, and cranberries. Vegetables All fresh vegetables.  Vegetables that are frozen without sauce or added salt. Low-sodium or sodium-free canned vegetables. Grains Bread with less than 80 mg of sodium per slice. Whole-wheat pasta, quinoa, and brown rice. Oats and oatmeal. Barley. Millet. Grits and cream of wheat. Whole-grain and whole-wheat cold cereal. Meats and other protein foods Lean cuts of meat. Skinless chicken and [place]. Fish with high omega-3 fatty acids, such as salmon, sardines, and other cold-water fishes. Eggs. Dried beans, peas, and edamame. Unsalted nuts and nut butters. Dairy Low-fat or nonfat (skim) milk and dried milk. Rice milk, soy milk, and almond milk. Low-fat or nonfat yogurt. Small amounts of reduced-sodium block cheese. Low-sodium cottage cheese. Fats and oils Olive, canola, soybean, flaxseed, avocado, or sunflower oil. Sweets and desserts Applesauce. Granola bars. Sugar-free pudding and gelatin. Frozen fruit bars. Seasoning and other foods Fresh and dried herbs. Lemon or lime juice. Vinegar. Low-sodium ketchup. Salt-free marinades, salad dressings, sauces, and seasonings. The items listed above may not be all the foods and drinks you can have. Talk with a dietitian to learn more. Foods to avoid Fruits Fruits that are dried with preservatives that contain sodium. Vegetables Canned vegetables. Frozen vegetables with sauce or seasonings. Creamed vegetables. [place] fries. Onion rings. Pickled vegetables and sauerkraut. Grains Bread with more than 80 mg of sodium per slice. Hot or cold cereal with more than 140 mg sodium per serving. Salted pretzels and crackers. Prepackaged breadcrumbs. Bagels, croissants, and biscuits. Meats and other protein foods Ribs and chicken wings. Bacon, ham, pepperoni, bologna, salami, and packaged luncheon meats. Hot dogs, bratwurst, and sausage. Canned meat. Smoked meat and fish. Salted nuts and seeds. Dairy Whole milk, half-and-half, and cream. Buttermilk. Processed cheese, cheese spreads, and  cheese curds. Regular cottage cheese. Feta cheese. Shredded cheese. String cheese. Fats and oils Butter, lard, shortening, ghee, and bacon fat. Canned and packaged gravies. Seasoning and other foods Onion salt, garlic salt, table salt, and sea salt. Marinades. Regular salad dressings. Relishes, pickles, and olives. Meat flavorings and tenderizers, and bouillon cubes. Horseradish, ketchup, and mustard. Worcestershire sauce. Teriyaki sauce, soy sauce (including reduced sodium). Hot sauce and Tabasco sauce. Steak sauce, fish sauce, oyster sauce, and cocktail sauce. Taco seasonings. Barbecue sauce. Tartar sauce. The items listed above may not be all the foods and drinks you should avoid. Talk with a dietitian to learn more. This information is not intended to replace advice given to you by your health care provider. Make sure you discuss any questions you have with your health care provider. Document Revised: 06/13/2023 Document Reviewed: 06/02/2023 Elsevier Patient Education  2024 [employer].

## 2024-03-31 LAB — BMP8+EGFR
BUN/Creatinine Ratio: 19 (ref 12–28)
BUN: 56 mg/dL — ABNORMAL HIGH (ref 8–27)
CO2: 19 mmol/L — ABNORMAL LOW (ref 20–29)
Calcium: 9.3 mg/dL (ref 8.7–10.3)
Chloride: 96 mmol/L (ref 96–106)
Creatinine, Ser: 2.92 mg/dL — ABNORMAL HIGH (ref 0.57–1.00)
Glucose: 138 mg/dL — ABNORMAL HIGH (ref 70–99)
Potassium: 4.9 mmol/L (ref 3.5–5.2)
Sodium: 136 mmol/L (ref 134–144)
eGFR: 16 mL/min/{1.73_m2} — ABNORMAL LOW (ref >59)

## 2024-03-31 LAB — BRAIN NATRIURETIC PEPTIDE: BNP: 293.6 pg/mL — ABNORMAL HIGH (ref 0.0–100.0)

## 2024-04-02 ENCOUNTER — Ambulatory Visit: Payer: Self-pay | Admitting: Nurse Practitioner

## 2024-04-04 ENCOUNTER — Telehealth: Payer: Self-pay | Admitting: Nurse Practitioner

## 2024-04-05 ENCOUNTER — Telehealth (HOSPITAL_COMMUNITY): Payer: Self-pay | Admitting: Family Medicine

## 2024-04-05 NOTE — Telephone Encounter (Signed)
 Called to confirm/remind patient of their appointment at the [facility] on 04/05/24 .   Appointment:   [] Confirmed  [x] Left mess   [] No answer/No voice mail  [] VM Full/unable to leave message  [] Phone not in service  Patient reminded to bring all medications and/or complete list.  Confirmed patient has transportation. Gave directions, instructed to utilize valet parking.

## 2024-04-06 ENCOUNTER — Encounter (HOSPITAL_COMMUNITY)

## 2024-04-18 ENCOUNTER — Telehealth: Payer: Self-pay | Admitting: Nurse Practitioner

## 2024-04-18 NOTE — Telephone Encounter (Signed)
 Patient DECLINED Diabetic Eye Exam at this time. Has been in the hospital.

## 2024-04-20 ENCOUNTER — Telehealth: Payer: Self-pay

## 2024-04-20 ENCOUNTER — Other Ambulatory Visit: Payer: Self-pay | Admitting: Nurse Practitioner

## 2024-04-20 ENCOUNTER — Telehealth: Payer: Self-pay | Admitting: Cardiovascular Disease

## 2024-04-20 MED ORDER — TORSEMIDE 20 MG PO TABS: 40.0000 mg | ORAL_TABLET | Freq: Every day | ORAL | 0 refills | Status: DC

## 2024-04-20 MED ORDER — SACUBITRIL-VALSARTAN 24-26 MG PO TABS: 1.0000 | ORAL_TABLET | Freq: Two times a day (BID) | ORAL | 0 refills | Status: DC

## 2024-04-20 NOTE — Telephone Encounter (Signed)
 Discussed with Dr. Katheryne Pane nurse.  Called pharmacy and [place].  Advised that pt is PRN with Dr. Arlester Ladd as of several months ago, and that pt has not established with the advanced heart failure yet.  Aware that we will not advise on medication that PCP started/ordered, advised pharmacy to discuss with ordering provider.  Made that office aware as well of our advisement.  Also spoke to pt's husband, dpr on file, informed that our office is not going to advise.  But did let him know that it should be ok for patient to take those medications.  They will await pharmacy calling to let them know when prescriptions are ready.

## 2024-04-20 NOTE — Telephone Encounter (Signed)
 Copied from CRM [id number]. Topic: Clinical - Prescription Issue >> Apr 20, 2024  4:59 PM Donald Frost wrote: Reason for CRM: Valinda Gault RN with [facility] on behalf of Dr Arlester Ladd called stating the patient is PRN and will not be coming back there as she is awaiting a call with the [facility] with Dr Alease Amend. Valinda Gault says Dr Arlester Ladd has advised that pharmaceutical needs would go back to the primary who prescribed the meds and that he no longer has any advisement in regards to it. Any questions about this Valinda Gault can be reached at [phone]

## 2024-04-20 NOTE — Telephone Encounter (Signed)
 Pt c/o medication issue:  1. Name of Medication:  spironolactone  (ALDACTONE ) 25 MG tablet  2. How are you currently taking this medication (dosage and times per day)?   3. Are you having a reaction (difficulty breathing--STAT)?   4. What is your medication issue?   Stew with [facility] would like clarification on Spironolactone  prescription + prescriptions received from PCP. Please advise.

## 2024-04-20 NOTE — Telephone Encounter (Unsigned)
 Copied from CRM [id number]. Topic: Clinical - Medication Refill >> Apr 20, 2024 10:14 AM Rosamond Comes wrote: Patient husband Dennard Fisher requesting medication refill  Medication:  torsemide  (DEMADEX ) 20 MG tablet sacubitril -valsartan  (ENTRESTO ) 24-26 MG   Has the patient contacted their pharmacy? Yes pharmacy stated to call provider  This is the patient's preferred pharmacy:   [facility] [place], [place] - [address] [place] [address] Phone: [phone] Fax: [phone]  Is this the correct pharmacy for this prescription? Yes If no, delete pharmacy and type the correct one.   Has the prescription been filled recently? Yes  Is the patient out of the medication? Yes  Has the patient been seen for an appointment in the last year OR does the patient have an upcoming appointment? Yes  Can we respond through MyChart? No  Agent: Please be advised that Rx refills may take up to 3 business days. We ask that you follow-up with your pharmacy.

## 2024-04-23 NOTE — Telephone Encounter (Signed)
 Just an FYI from [employer]. Looks like Ronal Quant prescribes most of pts meds anyway.

## 2024-04-24 NOTE — Telephone Encounter (Signed)
 Called and spoke with Stew at pharmacy. He states that the patient has already picked all 3 meds up. He had spoke with heart Dr and they said there should not be a problem with her taking all 3 if needed. Do you still want to hold spirolactone or continue with other newly prescribed meds?

## 2024-04-24 NOTE — Telephone Encounter (Signed)
 Call [facility] pharmacy and hold spirlactone for now

## 2024-04-26 NOTE — Telephone Encounter (Signed)
 Please let stew leave as is

## 2024-04-26 NOTE — Telephone Encounter (Signed)
 Stew notified

## 2024-05-23 ENCOUNTER — Other Ambulatory Visit: Payer: Self-pay | Admitting: Nurse Practitioner

## 2024-05-23 DIAGNOSIS — E1142 Type 2 diabetes mellitus with diabetic polyneuropathy: Secondary | ICD-10-CM

## 2024-05-24 ENCOUNTER — Other Ambulatory Visit: Payer: Self-pay | Admitting: Nurse Practitioner

## 2024-05-24 DIAGNOSIS — E1142 Type 2 diabetes mellitus with diabetic polyneuropathy: Secondary | ICD-10-CM

## 2024-05-25 NOTE — Telephone Encounter (Signed)
 pharmacy: CURRENT SCRIPT HAS DIRECTIONS OF ONCE DAILY. PT STATES SHE TAKES TWICE DAILY. PLEASE SEND NEW RX WITH UPDATED DIRECTIONS   Directions changed at visit on 10/21/23 but not Qty, but this is not in notes Lst OV for Dx 01/19/24 Next OV NOT sched Please advise

## 2024-05-28 ENCOUNTER — Telehealth: Payer: Self-pay | Admitting: Nurse Practitioner

## 2024-05-28 NOTE — Telephone Encounter (Signed)
 Copied from CRM [id number]. Topic: Clinical - Medication Question >> May 28, 2024 10:34 AM Laurier C wrote: Reason for CRM: Pharmacy is needing clarification for the following medication: metFORMIN  (GLUCOPHAGE -XR) 750 MG 24 hr tablet. Patient is taking the medication 2xs daily

## 2024-05-28 NOTE — Telephone Encounter (Signed)
 Husband is confused- metforminn she is on is to only be taken 1x a day- she is on extended release

## 2024-05-28 NOTE — Telephone Encounter (Signed)
 Called and spoke with Sidra at [facility]. Notified him of MMMs message. He states that he will discuss with husband when he comes in to pick up the medication

## 2024-06-13 ENCOUNTER — Ambulatory Visit: Admitting: Vascular Surgery

## 2024-06-19 ENCOUNTER — Other Ambulatory Visit: Payer: Self-pay | Admitting: Family Medicine

## 2024-06-26 ENCOUNTER — Ambulatory Visit: Payer: Self-pay

## 2024-06-26 NOTE — Telephone Encounter (Signed)
 Patient's husband states they are unable to come in today or tomorrow due to [place] appointments. Patient and husband encouraged patient to come in to office asap. Booked earliest availability for patient's schedule. Patient's husband and patient strongly educated for patient to go to ED/911 if patient's systolic BP gets below 80. Patient educated to call office back if she drops Systolic into 80's again. Patient and husband need further education on medication that could be causing low BP and if there are certain medications they should hold at this time. Please contact patient's husband back at [phone]

## 2024-06-26 NOTE — Telephone Encounter (Signed)
 FYI Only or Action Required?: Action required by provider: Please see notes for request to call patient back.  Patient was last seen in primary care on 03/30/2024 by Gladis Mustard, FNP.  Called Nurse Triage reporting Hypotension.  Symptoms began today.  Interventions attempted: Other: salty foods and hydration.  Symptoms are: stable.  Triage Disposition: See Physician Within 24 Hours  Patient/caregiver understands and will follow disposition?: Yes       Copied from CRM #[id number]. Topic: Clinical - Red Word Triage >> Jun 26, 2024  9:14 AM Myrick T wrote: Red Word that prompted transfer to Nurse Triage: spouse called stated patients blood pressure is 88/49. He took it around 9am and he gave her something salty to eat. Reason for Disposition  [1] Systolic BP 90-110 AND [2] taking blood pressure medications AND [3] NOT feeling weak or lightheaded  Answer Assessment - Initial Assessment Questions Patient's husband stated he gave her salty to help bring it up. Patient is not feeling dizzy or lightheaded.  Patient's husband states they are unable to come in today or tomorrow due to [place] appointments. Patient and husband encouraged patient to come in to office asap. Booked earliest availability for patient's schedule. Patient's husband and patient strongly educated for patient to go to ED/911 if patient's systolic BP gets below 80. Patient educated to call office back if she drops Systolic into 80's again. Patient and husband need further education on medication that could be causing low BP and if there are certain medications they should hold at this time. Please contact patient's husband back at [phone]  1. BLOOD PRESSURE: What is your blood pressure? Did you take at least two measurements 5 minutes apart?     88/49 this morning  2. ONSET: When did you take your blood pressure?     10 minutes ago, patient's husband took it again while we were on the phone and BP is  104/54. Today is the first time it has dropped low. 3. HOW: How did you take your blood pressure? (e.g., visiting nurse, automatic home BP monitor)     Home BP cuff 4. HISTORY: Do you have a history of low blood pressure? What is your blood pressure normally?     No 5. MEDICINES: Are you taking any medicines for blood pressure? If Yes, ask: Have they been changed recently?     Patient on Metoprolol  25 mg daily extended release, patient is also on Torsemide  6. PULSE RATE: Do you know what your pulse rate is?      BP machine  7. OTHER SYMPTOMS: Have you been sick recently? Have you had a recent injury?     Not sick recently and no infections recently  Protocols used: Blood Pressure - Low-A-AH

## 2024-06-26 NOTE — Telephone Encounter (Signed)
 Patient husband called stating that patients blood pressure was 88/57 this morning. He gave her some salty food and came up to 109/68. Patient was not dizzy, cor confused. Did not fall. She is much better this afternoon. Blood pressure was 107/59 when he checked it after lunch. Continue demadex  and aldactone  as prescribed  Reviewed hypotension and when to go to ED. Has appointment Thursday and he will bring blood pressure readings to office. Mary-Margaret Gladis, FNP

## 2024-06-28 ENCOUNTER — Ambulatory Visit: Admitting: Nurse Practitioner

## 2024-06-28 ENCOUNTER — Ambulatory Visit: Payer: Self-pay | Admitting: *Deleted

## 2024-06-28 NOTE — Telephone Encounter (Signed)
 Copied from CRM #[id number]. Topic: Clinical - Red Word Triage >> Jun 28, 2024 12:37 PM Fonda T wrote: [facility] that prompted transfer to Nurse Triage: Received call from spouse, [place], [place] verified, states spouse is having low blood pressure, that is fluctuating, last reading today was 100/61.  Patient spouse states appointment was cancelled for patient to be seen today, and was not aware, and needs an appointment as soon as possible due to low blood pressure. Answer Assessment - Initial Assessment Questions 1. BLOOD PRESSURE: What is your blood pressure? Did you take at least two measurements 5 minutes apart?     Clifford calling in.    BP 100/61 today.   I've been checking her BP for the last couple of days and it's be fine.   One of her fluid pills may be knocking it down.  Last time she was in the hospital they put her on it, the fluid pill.    No dizziness or lightheadedness.   She drinks plenty of water.   She has an appt coming up with the leg doctor.   They put her on the fluid pills for the swelling in her legs.   She drinks a lot of water and pees all day.   She had an appt for today at 1:45 or 2:00 but the office cancelled.  I don't know why.    They said they tried to call me but I never heard the call.   My phone was probably on the charger.   They did not call the house phone.  If they did I did not hear it. 2. ONSET: When did you take your blood pressure?     This morning  3. HOW: How did you take your blood pressure? (e.g., visiting nurse, automatic home BP monitor)     She has a  BP machine at home   One on wrist and one on the arm.  I'm using the wrist monitor.   She was sitting at the bar and her arm was lying on the bar.   I asked him to recheck the BP with her wrist positioned at her heart level between her breasts.   He didn't know where the arm cuff machine was. 90/56 at heart level.   Denies dizziness.  She says she feels fine.   Her normal BP is close to that.    She has not taken her fluid pill today or BP pill today.    She doesn't like salt at all.   Her body needs some sodium.   I'll give her salt in the palm of her hand and she will lick it and it will bring her BP up.    No shortness of breath at all.    Wife there with him.     She takes Aldactone , Toprol  XL, Torsemide  and Entrusto.    4. HISTORY: Do you have a history of low blood pressure? What is your blood pressure normally?     No 5. MEDICINES: Are you taking any medicines for blood pressure? If Yes, ask: Have they been changed recently?     Yes see above 6. PULSE RATE: Do you know what your pulse rate is?      Not asked 7. OTHER SYMPTOMS: Have you been sick recently? Have you had a recent injury?     No 8. PREGNANCY: Is there any chance you are pregnant? When was your last menstrual period?     N/A due to  age  Protocols used: Blood Pressure - Low-A-AH Husband, Curly calling in seeking advice on what to do about her BP medication and fluid pills.    Her BP is running low this morning.  He feels it's her fluid pills causing this.   She is on them for swelling her in legs.   No shortness of breath or chest tightness today.  Denies being dizzy or lightheaded.  BP 100/61.   Checked it while on phone with me it's 90/56.   Please call husband on landline phone at [phone].    I called into [facility] and was instructed to send my notes in to the clinical team.   Pt's appt was cancelled today due to provider being out sick.   They will call him back with further advice.       FYI Only or Action Required?: Action required by provider: clinical question for provider.  Patient was last seen in primary care on 03/30/2024 by Gladis Mustard, FNP.  Called Nurse Triage reporting Hypertension (Hypotension/).BP 90/56 while on phone with me.   This morning it was 100/61.  Symptoms began today. Denies dizziness/lightheadedness, no shortness of breath or chest pain.     Interventions attempted: Prescription medications: has not given her any BP or fluid pills for today.  Symptoms are: stable.  Triage Disposition: Call PCP now  Patient/caregiver understands and will follow disposition?:  Yes

## 2024-06-28 NOTE — Telephone Encounter (Signed)
 Apt made 07/03/2024 DOD

## 2024-07-03 ENCOUNTER — Ambulatory Visit: Admitting: Family Medicine

## 2024-07-05 ENCOUNTER — Ambulatory Visit: Admitting: Nurse Practitioner

## 2024-07-05 ENCOUNTER — Encounter: Payer: Self-pay | Admitting: Nurse Practitioner

## 2024-07-05 VITALS — BP 118/58 | HR 62 | Temp 98.1°F | Ht 62.0 in

## 2024-07-05 DIAGNOSIS — I959 Hypotension, unspecified: Secondary | ICD-10-CM | POA: Diagnosis not present

## 2024-07-05 DIAGNOSIS — Z23 Encounter for immunization: Secondary | ICD-10-CM

## 2024-07-05 NOTE — Patient Instructions (Signed)
 Hypotension As the heart beats, it forces blood through the body. Hypotension, commonly called low blood pressure, is when the force of blood pumping through the arteries is too weak. Arteries are blood vessels that carry blood from the heart throughout the body. Depending on the cause and severity, hypotension may be harmless (benign) or may cause serious problems (be critical). When your blood pressure is too low, you may not get enough blood to your brain or to the rest of your organs. This can cause weakness, light-headedness, a rapid heartbeat, and fainting. What are the causes? This condition may be caused by: Blood loss. Loss of body fluids (dehydration). Heart problems. Hormone (endocrine) problems. Pregnancy. Severe infection. Lack of certain nutrients. Severe allergic reactions (anaphylaxis). Certain medicines, such as blood pressure medicine or medicines that make the body lose excess fluids (diuretics). Sometimes, hypotension may be caused by not taking medicine as directed, such as taking too much of a certain medicine. What increases the risk? The following factors may make you more likely to develop this condition: Age. Risk increases as you get older. Having a condition that affects the heart or the central nervous system. What are the signs or symptoms? Common symptoms of this condition include: Weakness. Light-headedness. Dizziness. Blurred vision. Tiredness (fatigue). Rapid heartbeat. Fainting, in severe cases. How is this diagnosed? This condition is diagnosed based on: Your medical history. Your symptoms. Your blood pressure measurement. Your health care provider will check your blood pressure when you are: Lying down. Sitting. Standing. A blood pressure reading is recorded as two numbers, such as "120 over 80" (or 120/80). The first ("top") number is called the systolic pressure. It is a measure of the pressure in your arteries as your heart beats. The second  ("bottom") number is called the diastolic pressure. It is a measure of the pressure in your arteries when your heart relaxes between beats. Blood pressure is measured in a unit called mm Hg. Healthy blood pressure for most adults is 120/80. If your blood pressure is below 90/60, you may be diagnosed with hypotension. Other information or tests that may be used to diagnose hypotension include: Your other vital signs, such as your heart rate and temperature. Blood tests. Tilt table test. For this test, you will be safely secured to a table that moves you from a lying position to an upright position. Your heart rhythm and blood pressure will be monitored during the test. How is this treated? Treatment for this condition may include: Changing your diet. This may involve drinking more water or increasing your salt (sodium) intake with high-sodium foods. Taking medicines to raise your blood pressure. Changing the dosage of certain medicines you are taking that might be lowering your blood pressure. Wearing compression stockings. These stockings help to prevent blood clots and reduce swelling in your legs. In some cases, you may need to go to the hospital for: Fluid replacement. This means you will receive fluids through an IV. Blood replacement. This means you will receive donated blood through an IV (transfusion). Treating an infection or heart problems, if this applies. Monitoring. You may need to be monitored while medicines that you are taking wear off. Follow these instructions at home: Eating and drinking  Drink enough fluid to keep your urine pale yellow. Eat a healthy diet, and follow instructions from your health care provider about eating or drinking restrictions. A healthy diet includes: Fresh fruits and vegetables. Whole grains. Lean meats. Low-fat dairy products. Increase your salt intake if told  to do so. Do not add extra salt to your diet unless your health care provider tells you  to do that. Eat frequent, small meals. Avoid standing up suddenly after eating. Medicines Take over-the-counter and prescription medicines only as told by your health care provider. Follow instructions from your health care provider about changing the dosage of your current medicines, if this applies. Do not stop or adjust any of your medicines on your own. General instructions  Wear compression stockings as told by your health care provider. Get up slowly from lying down or sitting positions. This gives your blood pressure a chance to adjust. Avoid hot showers and excessive heat as directed by your health care provider. Return to your normal activities as told by your health care provider. Ask your health care provider what activities are safe for you. Do not use any products that contain nicotine or tobacco. These products include cigarettes, chewing tobacco, and vaping devices, such as e-cigarettes. If you need help quitting, ask your health care provider. Keep all follow-up visits. This is important. Contact a health care provider if: You vomit. You have diarrhea. You have a fever for more than 2-3 days. You feel more thirsty than usual. You feel weak and tired. Get help right away if: You have chest pain. You have a fast or irregular heartbeat. You develop numbness in any part of your body. You cannot move your arms or your legs. You have trouble speaking. You become sweaty or feel light-headed. You faint. You feel short of breath. You have trouble staying awake. You feel confused. These symptoms may be an emergency. Get help right away. Call 911. Do not wait to see if the symptoms will go away. Do not drive yourself to the hospital. Summary Hypotension is when the force of blood pumping through the arteries is too weak. Hypotension may be harmless (benign) or may cause serious problems (be critical). Treatment for this condition may include changing your diet, changing  your medicines, and wearing compression stockings. In some cases, you may need to go to the hospital for fluid or blood replacement. This information is not intended to replace advice given to you by your health care provider. Make sure you discuss any questions you have with your health care provider. Document Revised: 06/08/2021 Document Reviewed: 06/08/2021 Elsevier Patient Education  2024 [employer].

## 2024-07-05 NOTE — Progress Notes (Signed)
 Subjective:    Patient ID: Kylie Ward, female    DOB: November 27, 1945, 78 y.o.   MRN: [id number]   Chief Complaint: Blood pressure has been low   HPI  Patient has been having issues with low blood pressure. I son no blood pressure at all currently. Blood pressure has been running 100-120 systolic. When her blood pressure drops low he usually gives her some thing salty to eat and will usually comes up.  Patient Active Problem List   Diagnosis Date Noted   Coronary artery disease 03/17/2024   Acute on chronic systolic CHF (congestive heart failure) (HCC) 03/17/2024   Cellulitis of right lower extremity 03/17/2024   CKD stage 3b, GFR 30-44 ml/min (HCC) 03/17/2024   Acute exacerbation of CHF (congestive heart failure) (HCC) 03/16/2024   Primary insomnia 05/03/2023   Chronic midline low back pain without sciatica 05/03/2023   Coronary artery disease involving native coronary artery of native heart with unstable angina pectoris (HCC) 04/08/2023   Dementia without behavioral disturbance (HCC) 04/07/2023   Drug-induced myopathy 10/30/2020   Type 2 diabetes mellitus with diabetic polyneuropathy, with long-term current use of insulin  (HCC) 03/06/2018   Atherosclerosis of aorta (HCC) 09/04/2016   Peripheral edema 11/13/2015   GAD (generalized anxiety disorder) 08/12/2015   BMI 31.0-31.9,adult 05/08/2015   Carotid artery stenosis, symptomatic 01/14/2014   History of CVA (cerebrovascular accident) 12/10/2013   Hyperlipidemia associated with type 2 diabetes mellitus (HCC) 01/26/2013   Essential hypertension, benign 01/26/2013       Review of Systems  Constitutional:  Negative for diaphoresis.  Eyes:  Negative for pain.  Respiratory:  Negative for shortness of breath.   Cardiovascular:  Negative for chest pain, palpitations and leg swelling.  Gastrointestinal:  Negative for abdominal pain.  Endocrine: Negative for polydipsia.  Skin:  Negative for rash.  Neurological:  Negative for  dizziness, weakness and headaches.  Hematological:  Does not bruise/bleed easily.  All other systems reviewed and are negative.      Objective:   Physical Exam Constitutional:      Appearance: Normal appearance.  Cardiovascular:     Rate and Rhythm: Normal rate and regular rhythm.     Heart sounds: Normal heart sounds.  Pulmonary:     Effort: Pulmonary effort is normal.     Breath sounds: Normal breath sounds.  Musculoskeletal:     Right lower leg: Edema (1+) present.     Left lower leg: Edema (1+) present.  Skin:    General: Skin is warm.  Neurological:     General: No focal deficit present.     Mental Status: She is alert and oriented to person, place, and time.  Psychiatric:        Mood and Affect: Mood normal.        Behavior: Behavior normal.    BP (!) 118/58   Pulse 62   Temp 98.1 F (36.7 C) (Temporal)   Ht 5' 2 (1.575 m)   LMP 01/27/1983   SpO2 96%   BMI 29.07 kg/m         Assessment & Plan:   Kylie Ward in today with chief complaint of Blood pressure has been low   1. Hypotension, unspecified hypotension type (Primary) Continue current routine- if blood pressure drops below 100 systolic- give her something salty to eat.  Continue  to keep diary of blood pressure RTO prn    The above assessment and management plan was discussed with the patient. The patient verbalized  understanding of and has agreed to the management plan. Patient is aware to call the clinic if symptoms persist or worsen. Patient is aware when to return to the clinic for a follow-up visit. Patient educated on when it is appropriate to go to the emergency department.   Mary-Margaret Gladis, FNP

## 2024-07-11 ENCOUNTER — Encounter: Payer: Self-pay | Admitting: Vascular Surgery

## 2024-07-11 ENCOUNTER — Ambulatory Visit: Attending: Vascular Surgery | Admitting: Vascular Surgery

## 2024-07-11 VITALS — BP 95/58 | HR 60 | Temp 98.3°F

## 2024-07-11 DIAGNOSIS — I6529 Occlusion and stenosis of unspecified carotid artery: Secondary | ICD-10-CM

## 2024-07-11 DIAGNOSIS — I739 Peripheral vascular disease, unspecified: Secondary | ICD-10-CM

## 2024-07-11 DIAGNOSIS — L03115 Cellulitis of right lower limb: Secondary | ICD-10-CM | POA: Diagnosis not present

## 2024-07-11 NOTE — Progress Notes (Signed)
 Patient ID: Kylie Ward, female   DOB: [birth year], 78 y.o.   MRN: [id number]  Reason for Consult: Follow-up   Referred by Kylie Sharper, MD  Subjective:     HPI:  Kylie Ward is a 78 y.o. female with remote history of right carotid artery endarterectomy and left carotid stenting over 13 years ago.  She does have dementia does not have a history of stroke, TIA or amaurosis.  Will recently she has had recurrent bouts of right lower extremity cellulitis has been evaluated with ABIs by cardiology and now here for further evaluation.  She mostly sits.  She has swelling of the right greater than left lower extremity where she does have a cellulitis but currently not active.  She does not wear compression stockings.  No history of DVT no previous venous interventions.  Past Medical History:  Diagnosis Date   Anxiety    Diabetes mellitus without complication (HCC)    Hyperlipidemia    Hypertension    Stroke (HCC)    Family History  Problem Relation Age of Onset   Heart disease Father    Heart attack Father    Hypertension Father    Diabetes Sister    Stroke Sister    Diabetes Brother    Diabetes Sister    Diabetes Sister    Diabetes Brother    Diabetes Brother    Diabetes Brother    Past Surgical History:  Procedure Laterality Date   CAROTID ENDARTERECTOMY Right    CAROTID STENT     LEFT HEART CATH AND CORONARY ANGIOGRAPHY N/A 04/07/2023   Procedure: LEFT HEART CATH AND CORONARY ANGIOGRAPHY;  Surgeon: Kylie Sharper, MD;  Location: [facility] INVASIVE CV LAB;  Service: Cardiovascular;  Laterality: N/A;    Short Social History:  Social History   Tobacco Use   Smoking status: Former    Current packs/day: 0.00    Types: Cigarettes    Quit date: 12/02/2009    Years since quitting: 14.6   Smokeless tobacco: Never  Substance Use Topics   Alcohol use: No    Allergies  Allergen Reactions   Crestor  [Rosuvastatin ] Other (See Comments)    Myalgias and arthralgias to the  point she had difficulty walking. She was tolerant of lipitor from the family's recall of events.    Niaspan [Niacin Er (Antihyperlipidemic)] Itching    Skin burning    Current Outpatient Medications  Medication Sig Dispense Refill   Accu-Chek FastClix Lancets MISC CHECK BLOOD SUGAR UP TO 3 TIMES A DAY AS DIRECTED Dx E11.42 306 each 3   ALPRAZolam  (XANAX ) 1 MG tablet Take 1 tablet (1 mg total) by mouth 2 (two) times daily. 60 tablet 5   atorvastatin  (LIPITOR) 40 MG tablet Take 1 tablet (40 mg total) by mouth daily. 90 tablet 3   clopidogrel  (PLAVIX ) 75 MG tablet Take 1 tablet (75 mg total) by mouth daily. 90 tablet 3   Continuous Glucose Sensor (FREESTYLE LIBRE 2 SENSOR) MISC CHANGE SENSOR ON BACK OF ARM EVERY 14 DAYS AS DIRECTED TO CHECK BLOOD SUGAR CONTINUOUSLY 6 each 3   dapagliflozin  propanediol (FARXIGA ) 10 MG TABS tablet Take 1 tablet (10 mg total) by mouth daily before breakfast. 90 tablet 3   donepezil  (ARICEPT ) 10 MG tablet Take 1 tablet (10 mg total) by mouth at bedtime. 90 tablet 1   ENTRESTO  24-26 MG TAKE ONE TABLET TWICE DAILY 60 tablet 0   escitalopram  (LEXAPRO ) 20 MG tablet Take 1 tablet (20 mg total) by  mouth daily. 90 tablet 1   glucose blood (ACCU-CHEK SMARTVIEW) test strip CHECK BLOOD SUGAR UP TO 3 TIMES A DAY AS DIRECTED 100 strip 3   insulin  aspart (NOVOLOG ) 100 UNIT/ML injection Inject 0-10 Units into the skin See admin instructions. 0-10 units per sliding scale, three times daily before meals. 10 mL 2   insulin  glargine (LANTUS ) 100 UNIT/ML injection Inject 0.2 mLs (20 Units total) into the skin daily. 40 mL 0   Insulin  Pen Needle (SURE COMFORT PEN NEEDLES) 31G X 8 MM MISC USE TO inject lantus  ONCE daily 100 each 2   memantine  (NAMENDA ) 10 MG tablet Take 1 tablet (10 mg total) by mouth 2 (two) times daily. 180 tablet 1   metFORMIN  (GLUCOPHAGE -XR) 750 MG 24 hr tablet TAKE ONE TABLET BY MOUTH DAILY WITH BREAKFAST 180 tablet 0   metoprolol  succinate (TOPROL -XL) 25 MG 24 hr  tablet Take 1 tablet (25 mg total) by mouth daily. 90 tablet 3   polyethylene glycol powder (GLYCOLAX /MIRALAX ) 17 GM/SCOOP powder Take 17 g by mouth daily. (Patient taking differently: Take 17 g by mouth daily as needed for moderate constipation.) 3350 g 1   spironolactone  (ALDACTONE ) 25 MG tablet Take 1 tablet (25 mg total) by mouth daily. 30 tablet 0   torsemide  (DEMADEX ) 20 MG tablet TAKE TWO TABLETS DAILY 60 tablet 0   traZODone  (DESYREL ) 50 MG tablet Take 1 tablet (50 mg total) by mouth at bedtime. 90 tablet 1   No current facility-administered medications for this visit.    Review of Systems  Constitutional:  Constitutional negative. HENT: HENT negative.  Eyes: Eyes negative.  Cardiovascular: Positive for leg swelling.  GI: Gastrointestinal negative.  Musculoskeletal: Positive for leg pain.  Skin: Positive for rash.  Neurological: Neurological negative. Hematologic: Hematologic/lymphatic negative.  Psychiatric: Psychiatric negative.        Objective:  Objective   Vitals:   07/11/24 1558  BP: (!) 95/58  Pulse: 60  Temp: 98.3 F (36.8 C)   There is no height or weight on file to calculate BMI.  Physical Exam HENT:     Mouth/Throat:     Mouth: Mucous membranes are moist.  Eyes:     Pupils: Pupils are equal, round, and reactive to light.  Cardiovascular:     Pulses:          Femoral pulses are 0 on the right side and 0 on the left side. Abdominal:     General: Abdomen is flat.     Palpations: Abdomen is soft.  Musculoskeletal:     Right lower leg: Edema present.     Left lower leg: No edema.  Skin:    General: Skin is warm.     Capillary Refill: Capillary refill takes 2 to 3 seconds.     Comments: Evidence of chronic venous insufficiency right lower extremity  Neurological:     Mental Status: She is alert. Mental status is at baseline.     Data: ABI Findings:  +---------+------------------+-----+----------+--------+  Right   Rt Pressure  (mmHg)IndexWaveform  Comment   +---------+------------------+-----+----------+--------+  Brachial 166                                        +---------+------------------+-----+----------+--------+  ATA     86                0.52 monophasic          +---------+------------------+-----+----------+--------+  PTA     77                0.46 monophasic          +---------+------------------+-----+----------+--------+  Great Toe63                0.38                     +---------+------------------+-----+----------+--------+   +---------+------------------+-----+----------+-------+  Left    Lt Pressure (mmHg)IndexWaveform  Comment  +---------+------------------+-----+----------+-------+  ATA     70                0.42 monophasic         +---------+------------------+-----+----------+-------+  PTA     61                0.37 monophasic         +---------+------------------+-----+----------+-------+  Great Toe58                0.35                    +---------+------------------+-----+----------+-------+   +-------+-----------+-----------+------------+------------+  ABI/TBIToday's ABIToday's TBIPrevious ABIPrevious TBI  +-------+-----------+-----------+------------+------------+  Right 0.52       0.38                                 +-------+-----------+-----------+------------+------------+  Left  0.42       0.35                                 +-------+-----------+-----------+------------+------------+        Summary:  Right: Resting right ankle-brachial index indicates moderate right lower  extremity arterial disease. The right toe-brachial index is abnormal.   Left: Resting left ankle-brachial index indicates severe left lower  extremity arterial disease. The left toe-brachial index is abnormal.       Assessment/Plan:    78 year old female with recurrent cellulitis right lower extremity which appears to be  multifactorial in nature.  She certainly appears to have chronic venous insufficiency.  Given her underlying dementia we have discussed only performing procedures that are absolutely necessary.  For now we will attempt at least gentle knee-high compression of the right lower extremity to attempt to reduce the edema.  We also discussed elevating her legs when she is sedentary.  We did review her previous CT angio from 1 year ago which demonstrates distal aortic occlusion with reconstitution of the proximal common iliac arteries.  We discussed that if her cellulitis persist we may need to perform angiography to include stenting of her aorta and proximal common iliac arteries this would be reserved for instances where she remains symptomatic.  We will otherwise plan for compression and recheck ABIs in 6 months.  All questions were answered and they demonstrate good understanding.  Plan is for recheck of carotid duplex in 1 more year.     Penne Lonni Colorado MD [facility]

## 2024-07-12 ENCOUNTER — Other Ambulatory Visit: Payer: Self-pay

## 2024-07-12 DIAGNOSIS — I739 Peripheral vascular disease, unspecified: Secondary | ICD-10-CM

## 2024-07-18 ENCOUNTER — Ambulatory Visit: Admitting: Vascular Surgery

## 2024-07-19 ENCOUNTER — Other Ambulatory Visit: Payer: Self-pay | Admitting: Physician Assistant

## 2024-07-19 DIAGNOSIS — I5043 Acute on chronic combined systolic (congestive) and diastolic (congestive) heart failure: Secondary | ICD-10-CM

## 2024-08-01 ENCOUNTER — Other Ambulatory Visit: Payer: Self-pay | Admitting: Nurse Practitioner

## 2024-08-01 DIAGNOSIS — F411 Generalized anxiety disorder: Secondary | ICD-10-CM

## 2024-08-09 ENCOUNTER — Ambulatory Visit

## 2024-08-21 NOTE — Progress Notes (Signed)
 Kylie Ward                                          MRN: [id number]   08/21/2024   The VBCI Quality Team Specialist reviewed this patient medical record for the purposes of chart review for care gap closure. The following were reviewed: chart review for care gap closure-kidney health evaluation for diabetes:eGFR  and uACR.    VBCI Quality Team

## 2024-08-23 ENCOUNTER — Ambulatory Visit (HOSPITAL_COMMUNITY)

## 2024-09-13 NOTE — Telephone Encounter (Signed)
 Aware FL2 has been faxed to [facility]

## 2024-09-17 ENCOUNTER — Telehealth: Payer: Self-pay | Admitting: Pharmacist

## 2024-09-17 DIAGNOSIS — E1169 Type 2 diabetes mellitus with other specified complication: Secondary | ICD-10-CM

## 2024-09-17 MED ORDER — ATORVASTATIN CALCIUM 40 MG PO TABS: 40.0000 mg | ORAL_TABLET | Freq: Every day | ORAL | 3 refills | Status: AC

## 2024-09-17 NOTE — Telephone Encounter (Signed)
   This patient is appearing on a report for being at risk of failing the adherence measure for cholesterol (statin) medications this calendar year.   Medication: atorvastatin  Last fill date: 06/19/24 for 90 day supply  Contacted pharmacy to facilitate refills. and Will collaborate with provider to facilitate refill needs.   Donn Zanetti Dattero Jayd Forrey, PharmD, BCACP, CPP Clinical Pharmacist, [facility]

## 2024-09-18 ENCOUNTER — Ambulatory Visit (HOSPITAL_COMMUNITY): Admitting: Cardiology

## 2024-09-20 ENCOUNTER — Other Ambulatory Visit: Payer: Self-pay | Admitting: Nurse Practitioner

## 2024-09-20 MED ORDER — TORSEMIDE 20 MG PO TABS: 40.0000 mg | ORAL_TABLET | Freq: Every day | ORAL | 0 refills | Status: DC

## 2024-09-20 NOTE — Telephone Encounter (Signed)
 MMM pt NTBS 30-d given 08/01/24

## 2024-09-20 NOTE — Telephone Encounter (Signed)
 Made appt for dec2

## 2024-09-20 NOTE — Addendum Note (Signed)
 Addended by: Randie Tallarico D on: 09/20/2024 03:07 PM   Modules accepted: Orders

## 2024-10-02 ENCOUNTER — Ambulatory Visit

## 2024-10-02 ENCOUNTER — Ambulatory Visit: Admitting: Nurse Practitioner

## 2024-10-05 ENCOUNTER — Ambulatory Visit (HOSPITAL_COMMUNITY)
Admission: RE | Admit: 2024-10-05 | Discharge: 2024-10-05 | Disposition: A | Discharge status: Home / Self Care | Source: Ambulatory Visit | Attending: Physician Assistant | Admitting: Physician Assistant

## 2024-10-05 DIAGNOSIS — I5021 Acute systolic (congestive) heart failure: Secondary | ICD-10-CM

## 2024-10-05 DIAGNOSIS — I5043 Acute on chronic combined systolic (congestive) and diastolic (congestive) heart failure: Secondary | ICD-10-CM

## 2024-10-05 LAB — ECHOCARDIOGRAM COMPLETE
Area-P 1/2: 2.83 cm2
MV VTI: 1.25 cm2
S' Lateral: 2.8 cm

## 2024-10-08 ENCOUNTER — Emergency Department (HOSPITAL_COMMUNITY)

## 2024-10-08 ENCOUNTER — Encounter (HOSPITAL_COMMUNITY): Payer: Self-pay | Admitting: Emergency Medicine

## 2024-10-08 ENCOUNTER — Ambulatory Visit: Payer: Self-pay | Admitting: Physician Assistant

## 2024-10-08 ENCOUNTER — Other Ambulatory Visit: Payer: Self-pay

## 2024-10-08 ENCOUNTER — Inpatient Hospital Stay (HOSPITAL_COMMUNITY)
Admission: EM | Admit: 2024-10-08 | Discharge: 2024-10-12 | DRG: 689 | Disposition: A | Discharge status: Skilled Nursing Facility | Source: Skilled Nursing Facility | Attending: Family Medicine | Admitting: Family Medicine

## 2024-10-08 DIAGNOSIS — I6930 Unspecified sequelae of cerebral infarction: Secondary | ICD-10-CM | POA: Diagnosis not present

## 2024-10-08 DIAGNOSIS — R29818 Other symptoms and signs involving the nervous system: Principal | ICD-10-CM | POA: Diagnosis present

## 2024-10-08 DIAGNOSIS — Z7902 Long term (current) use of antithrombotics/antiplatelets: Secondary | ICD-10-CM | POA: Diagnosis not present

## 2024-10-08 DIAGNOSIS — I9589 Other hypotension: Secondary | ICD-10-CM | POA: Diagnosis not present

## 2024-10-08 DIAGNOSIS — J9601 Acute respiratory failure with hypoxia: Secondary | ICD-10-CM | POA: Diagnosis present

## 2024-10-08 DIAGNOSIS — I5032 Chronic diastolic (congestive) heart failure: Secondary | ICD-10-CM | POA: Diagnosis present

## 2024-10-08 DIAGNOSIS — R4789 Other speech disturbances: Secondary | ICD-10-CM | POA: Diagnosis not present

## 2024-10-08 DIAGNOSIS — R29810 Facial weakness: Secondary | ICD-10-CM | POA: Diagnosis not present

## 2024-10-08 DIAGNOSIS — E1151 Type 2 diabetes mellitus with diabetic peripheral angiopathy without gangrene: Secondary | ICD-10-CM | POA: Diagnosis present

## 2024-10-08 DIAGNOSIS — Z794 Long term (current) use of insulin: Secondary | ICD-10-CM | POA: Diagnosis not present

## 2024-10-08 DIAGNOSIS — Z66 Do not resuscitate: Secondary | ICD-10-CM | POA: Diagnosis present

## 2024-10-08 DIAGNOSIS — E1165 Type 2 diabetes mellitus with hyperglycemia: Secondary | ICD-10-CM | POA: Diagnosis present

## 2024-10-08 DIAGNOSIS — R7989 Other specified abnormal findings of blood chemistry: Secondary | ICD-10-CM | POA: Diagnosis not present

## 2024-10-08 DIAGNOSIS — I959 Hypotension, unspecified: Secondary | ICD-10-CM | POA: Diagnosis not present

## 2024-10-08 DIAGNOSIS — B961 Klebsiella pneumoniae [K. pneumoniae] as the cause of diseases classified elsewhere: Secondary | ICD-10-CM | POA: Diagnosis present

## 2024-10-08 DIAGNOSIS — N390 Urinary tract infection, site not specified: Secondary | ICD-10-CM | POA: Diagnosis present

## 2024-10-08 DIAGNOSIS — L03115 Cellulitis of right lower limb: Secondary | ICD-10-CM | POA: Diagnosis present

## 2024-10-08 DIAGNOSIS — N1832 Chronic kidney disease, stage 3b: Secondary | ICD-10-CM | POA: Diagnosis present

## 2024-10-08 DIAGNOSIS — R571 Hypovolemic shock: Secondary | ICD-10-CM | POA: Diagnosis present

## 2024-10-08 DIAGNOSIS — G9341 Metabolic encephalopathy: Secondary | ICD-10-CM | POA: Diagnosis present

## 2024-10-08 DIAGNOSIS — N179 Acute kidney failure, unspecified: Secondary | ICD-10-CM | POA: Diagnosis present

## 2024-10-08 DIAGNOSIS — E872 Acidosis, unspecified: Secondary | ICD-10-CM | POA: Diagnosis present

## 2024-10-08 DIAGNOSIS — I69392 Facial weakness following cerebral infarction: Secondary | ICD-10-CM | POA: Diagnosis not present

## 2024-10-08 DIAGNOSIS — Z1152 Encounter for screening for COVID-19: Secondary | ICD-10-CM | POA: Diagnosis not present

## 2024-10-08 DIAGNOSIS — F32A Depression, unspecified: Secondary | ICD-10-CM | POA: Diagnosis present

## 2024-10-08 DIAGNOSIS — G459 Transient cerebral ischemic attack, unspecified: Secondary | ICD-10-CM | POA: Diagnosis present

## 2024-10-08 DIAGNOSIS — I69328 Other speech and language deficits following cerebral infarction: Secondary | ICD-10-CM | POA: Diagnosis not present

## 2024-10-08 DIAGNOSIS — E1122 Type 2 diabetes mellitus with diabetic chronic kidney disease: Secondary | ICD-10-CM | POA: Diagnosis present

## 2024-10-08 DIAGNOSIS — I130 Hypertensive heart and chronic kidney disease with heart failure and stage 1 through stage 4 chronic kidney disease, or unspecified chronic kidney disease: Secondary | ICD-10-CM | POA: Diagnosis present

## 2024-10-08 DIAGNOSIS — F0393 Unspecified dementia, unspecified severity, with mood disturbance: Secondary | ICD-10-CM | POA: Diagnosis present

## 2024-10-08 DIAGNOSIS — F0394 Unspecified dementia, unspecified severity, with anxiety: Secondary | ICD-10-CM | POA: Diagnosis present

## 2024-10-08 DIAGNOSIS — E11649 Type 2 diabetes mellitus with hypoglycemia without coma: Secondary | ICD-10-CM | POA: Diagnosis present

## 2024-10-08 LAB — URINALYSIS, W/ REFLEX TO CULTURE (INFECTION SUSPECTED)
Bilirubin Urine: NEGATIVE
Glucose, UA: >=500 mg/dL — AB
Ketones, ur: NEGATIVE mg/dL
Nitrite: NEGATIVE
Protein, ur: NEGATIVE mg/dL
Specific Gravity, Urine: 1.01 (ref 1.005–1.030)
WBC, UA: >50 WBC/hpf (ref 0–5)
pH: 5 (ref 5.0–8.0)

## 2024-10-08 LAB — COMPREHENSIVE METABOLIC PANEL WITH GFR
ALT: 24 U/L (ref 0–44)
AST: 36 U/L (ref 15–41)
Albumin: 4 g/dL (ref 3.5–5.0)
Alkaline Phosphatase: 159 U/L — ABNORMAL HIGH (ref 38–126)
Anion gap: 20 — ABNORMAL HIGH (ref 5–15)
BUN: 45 mg/dL — ABNORMAL HIGH (ref 8–23)
CO2: 19 mmol/L — ABNORMAL LOW (ref 22–32)
Calcium: 8.7 mg/dL — ABNORMAL LOW (ref 8.9–10.3)
Chloride: 98 mmol/L (ref 98–111)
Creatinine, Ser: 2.61 mg/dL — ABNORMAL HIGH (ref 0.44–1.00)
GFR, Estimated: 18 mL/min — ABNORMAL LOW (ref >60)
Glucose, Bld: 263 mg/dL — ABNORMAL HIGH (ref 70–99)
Potassium: 5 mmol/L (ref 3.5–5.1)
Sodium: 138 mmol/L (ref 135–145)
Total Bilirubin: 0.3 mg/dL (ref 0.0–1.2)
Total Protein: 7.2 g/dL (ref 6.5–8.1)

## 2024-10-08 LAB — URINE DRUG SCREEN
Amphetamines: NEGATIVE
Barbiturates: NEGATIVE
Benzodiazepines: POSITIVE — AB
Cocaine: NEGATIVE
Fentanyl: NEGATIVE
Methadone Scn, Ur: NEGATIVE
Opiates: NEGATIVE
Tetrahydrocannabinol: NEGATIVE

## 2024-10-08 LAB — I-STAT CHEM 8, ED
BUN: 49 mg/dL — ABNORMAL HIGH (ref 8–23)
Calcium, Ion: 0.95 mmol/L — ABNORMAL LOW (ref 1.15–1.40)
Chloride: 105 mmol/L (ref 98–111)
Creatinine, Ser: 2.5 mg/dL — ABNORMAL HIGH (ref 0.44–1.00)
Glucose, Bld: 263 mg/dL — ABNORMAL HIGH (ref 70–99)
HCT: 43 % (ref 36.0–46.0)
Hemoglobin: 14.6 g/dL (ref 12.0–15.0)
Potassium: 4.1 mmol/L (ref 3.5–5.1)
Sodium: 138 mmol/L (ref 135–145)
TCO2: 22 mmol/L (ref 22–32)

## 2024-10-08 LAB — CBC
HCT: 40.9 % (ref 36.0–46.0)
Hemoglobin: 13.1 g/dL (ref 12.0–15.0)
MCH: 29.2 pg (ref 26.0–34.0)
MCHC: 32 g/dL (ref 30.0–36.0)
MCV: 91.3 fL (ref 80.0–100.0)
Platelets: 238 K/uL (ref 150–400)
RBC: 4.48 MIL/uL (ref 3.87–5.11)
RDW: 13 % (ref 11.5–15.5)
WBC: 12.5 K/uL — ABNORMAL HIGH (ref 4.0–10.5)
nRBC: 0 % (ref 0.0–0.2)

## 2024-10-08 LAB — DIFFERENTIAL
Abs Immature Granulocytes: 0.04 K/uL (ref 0.00–0.07)
Basophils Absolute: 0.1 K/uL (ref 0.0–0.1)
Basophils Relative: 0 %
Eosinophils Absolute: 0.1 K/uL (ref 0.0–0.5)
Eosinophils Relative: 1 %
Immature Granulocytes: 0 %
Lymphocytes Relative: 13 %
Lymphs Abs: 1.7 K/uL (ref 0.7–4.0)
Monocytes Absolute: 0.7 K/uL (ref 0.1–1.0)
Monocytes Relative: 6 %
Neutro Abs: 9.9 K/uL — ABNORMAL HIGH (ref 1.7–7.7)
Neutrophils Relative %: 80 %

## 2024-10-08 LAB — CBG MONITORING, ED
Glucose-Capillary: 253 mg/dL — ABNORMAL HIGH (ref 70–99)
Glucose-Capillary: 206 mg/dL — ABNORMAL HIGH (ref 70–99)

## 2024-10-08 LAB — LACTIC ACID, PLASMA
Lactic Acid, Venous: 1.8 mmol/L (ref 0.5–1.9)
Lactic Acid, Venous: 1.6 mmol/L (ref 0.5–1.9)

## 2024-10-08 LAB — ETHANOL: Alcohol, Ethyl (B): <15 mg/dL (ref <15)

## 2024-10-08 LAB — PROTIME-INR
INR: 0.9 (ref 0.8–1.2)
Prothrombin Time: 13.2 s (ref 11.4–15.2)

## 2024-10-08 LAB — TROPONIN T, HIGH SENSITIVITY
Troponin T High Sensitivity: 146 ng/L (ref 0–19)
Troponin T High Sensitivity: 102 ng/L (ref 0–19)

## 2024-10-08 LAB — APTT: aPTT: 31 s (ref 24–36)

## 2024-10-08 MED ORDER — INSULIN ASPART 100 UNIT/ML IJ SOLN
0.0000 [IU] | Freq: Three times a day (TID) | INTRAMUSCULAR | Status: DC
  Administered 2024-10-09 (×2): 3 [IU] via SUBCUTANEOUS
  Administered 2024-10-09: 5 [IU] via SUBCUTANEOUS
  Administered 2024-10-10: 2 [IU] via SUBCUTANEOUS
  Administered 2024-10-10: 1 [IU] via SUBCUTANEOUS
  Administered 2024-10-10: 3 [IU] via SUBCUTANEOUS
  Administered 2024-10-11: 7 [IU] via SUBCUTANEOUS
  Administered 2024-10-11: 3 [IU] via SUBCUTANEOUS
  Administered 2024-10-11: 9 [IU] via SUBCUTANEOUS
  Administered 2024-10-12: 5 [IU] via SUBCUTANEOUS
  Administered 2024-10-12: 3 [IU] via SUBCUTANEOUS
  Filled 2024-10-08 (×11): qty 1

## 2024-10-08 MED ORDER — INSULIN ASPART 100 UNIT/ML IJ SOLN
0.0000 [IU] | Freq: Every day | INTRAMUSCULAR | Status: DC
  Administered 2024-10-08 – 2024-10-11 (×3): 2 [IU] via SUBCUTANEOUS
  Filled 2024-10-08 (×3): qty 1

## 2024-10-08 MED ORDER — CHLORHEXIDINE GLUCONATE CLOTH 2 % EX PADS
6.0000 | MEDICATED_PAD | Freq: Every day | CUTANEOUS | Status: DC
  Administered 2024-10-09 – 2024-10-10 (×2): 6 via TOPICAL

## 2024-10-08 MED ORDER — SODIUM CHLORIDE 0.9 % IV BOLUS
1000.0000 mL | Freq: Once | INTRAVENOUS | Status: AC
  Administered 2024-10-08: 1000 mL via INTRAVENOUS

## 2024-10-08 MED ORDER — SODIUM CHLORIDE 0.9 % IV SOLN
2.0000 g | Freq: Once | INTRAVENOUS | Status: AC
  Administered 2024-10-08: 2 g via INTRAVENOUS
  Filled 2024-10-08: qty 20

## 2024-10-08 MED ORDER — MEMANTINE HCL 10 MG PO TABS
10.0000 mg | ORAL_TABLET | Freq: Two times a day (BID) | ORAL | Status: DC
  Administered 2024-10-08 – 2024-10-12 (×8): 10 mg via ORAL
  Filled 2024-10-08 (×8): qty 1

## 2024-10-08 MED ORDER — MELATONIN 3 MG PO TABS
6.0000 mg | ORAL_TABLET | Freq: Every evening | ORAL | Status: AC | PRN
  Administered 2024-10-09 – 2024-10-10 (×2): 6 mg via ORAL
  Filled 2024-10-08 (×2): qty 2

## 2024-10-08 MED ORDER — HEPARIN SODIUM (PORCINE) 5000 UNIT/ML IJ SOLN
5000.0000 [IU] | Freq: Three times a day (TID) | INTRAMUSCULAR | Status: DC
  Administered 2024-10-08 – 2024-10-12 (×12): 5000 [IU] via SUBCUTANEOUS
  Filled 2024-10-08 (×12): qty 1

## 2024-10-08 MED ORDER — CLOPIDOGREL BISULFATE 75 MG PO TABS
75.0000 mg | ORAL_TABLET | Freq: Every day | ORAL | Status: DC
  Administered 2024-10-09 – 2024-10-12 (×4): 75 mg via ORAL
  Filled 2024-10-08 (×4): qty 1

## 2024-10-08 MED ORDER — PROCHLORPERAZINE EDISYLATE 10 MG/2ML IJ SOLN
5.0000 mg | Freq: Four times a day (QID) | INTRAMUSCULAR | Status: AC | PRN
  Administered 2024-10-10 – 2024-10-11 (×2): 5 mg via INTRAVENOUS
  Filled 2024-10-08 (×2): qty 2

## 2024-10-08 MED ORDER — SODIUM CHLORIDE 0.9 % IV SOLN: INTRAVENOUS | Status: AC

## 2024-10-08 MED ORDER — MIDODRINE HCL 5 MG PO TABS
10.0000 mg | ORAL_TABLET | ORAL | Status: AC
  Administered 2024-10-08: 10 mg via ORAL
  Filled 2024-10-08: qty 2

## 2024-10-08 MED ORDER — IOHEXOL 350 MG/ML SOLN: 60.0000 mL | Freq: Once | INTRAVENOUS | Status: DC | PRN

## 2024-10-08 MED ORDER — POLYETHYLENE GLYCOL 3350 17 G PO PACK: 17.0000 g | PACK | Freq: Every day | ORAL | Status: AC | PRN

## 2024-10-08 MED ORDER — ASPIRIN 325 MG PO TABS
325.0000 mg | ORAL_TABLET | Freq: Once | ORAL | Status: AC
  Administered 2024-10-08: 325 mg via ORAL
  Filled 2024-10-08: qty 1

## 2024-10-08 MED ORDER — INSULIN GLARGINE 100 UNIT/ML ~~LOC~~ SOLN
3.0000 [IU] | Freq: Two times a day (BID) | SUBCUTANEOUS | Status: DC
  Administered 2024-10-09 – 2024-10-12 (×7): 3 [IU] via SUBCUTANEOUS
  Filled 2024-10-08 (×10): qty 0.03

## 2024-10-08 MED ORDER — ESCITALOPRAM OXALATE 10 MG PO TABS
20.0000 mg | ORAL_TABLET | Freq: Every day | ORAL | Status: DC
  Administered 2024-10-09 – 2024-10-12 (×4): 20 mg via ORAL
  Filled 2024-10-08 (×4): qty 2

## 2024-10-08 MED ORDER — SODIUM CHLORIDE 0.9 % IV SOLN
2.0000 g | INTRAVENOUS | Status: DC
  Administered 2024-10-09 – 2024-10-11 (×3): 2 g via INTRAVENOUS
  Filled 2024-10-08 (×3): qty 20

## 2024-10-08 MED ORDER — ACETAMINOPHEN 500 MG PO TABS
500.0000 mg | ORAL_TABLET | Freq: Four times a day (QID) | ORAL | Status: DC | PRN
  Administered 2024-10-08: 500 mg via ORAL
  Filled 2024-10-08: qty 1

## 2024-10-08 MED ORDER — ATORVASTATIN CALCIUM 40 MG PO TABS
40.0000 mg | ORAL_TABLET | Freq: Every day | ORAL | Status: DC
  Administered 2024-10-09 – 2024-10-12 (×4): 40 mg via ORAL
  Filled 2024-10-08 (×4): qty 1

## 2024-10-08 NOTE — ED Notes (Addendum)
CODE STROKE paged out at this time.

## 2024-10-08 NOTE — Consult Note (Signed)
 [facility] Telemedicine Consult   Requesting Provider: Suzette Consult Participants: Nurse Location of the provider: [facility] Location of the patient: [facility]  This consult was provided via telemedicine with 2-way video and audio communication. The patient/family was informed that care would be provided in this way and agreed to receive care in this manner.    Chief Complaint: Facial droop, speech deficit  HPI: 78 year old female with medical history significant for dementia, CVA (unknown residual-on Plavix  and Lipitor), severe multivessel coronary artery disease not a candidate for CABG because of her dementia, Carotid stenosis s/p bilateral CEA (2015,2016), ischemic cardiomyopathy with EF of 25%, known peripheral arterial disease, and recurrent lower extremity cellulitis who was at baseline today at her facility when she went to the bathroom.  When she returned from the bathroom noted to have facial droop and difficulty with speech (was repeating things).  EMS called at that time and patient was brought in as  a code stroke.      LKW: 10/08/24 at 1730 tpa given?: No, No focality noted on exam, low likelihood of infarct IR Thrombectomy? No, Exam not consistent with LVO, patient with high mRS Modified Rankin Scale: 3-Moderate disability-requires help but walks WITHOUT assistance Time of teleneurologist evaluation: 1816 Tine of page: 1815  Exam: BP 84/60, HR 67  General: NAD but reports that she hurts everywhere  1A: Level of Consciousness - 0 1B: Ask Month and Age - 2 1C: 'Blink Eyes' & 'Squeeze Hands' - 0 2: Test Horizontal Extraocular Movements - 0 3: Test Visual Fields - 0 4: Test Facial Palsy - 1 5A: Test Left Arm Motor Drift - 0 5B: Test Right Arm Motor Drift - 0 6A: Test Left Leg Motor Drift - 0 6B: Test Right Leg Motor Drift - 0 7: Test Limb Ataxia - 0 8: Test Sensation - 0 9: Test Language/Aphasia- 0 10: Test Dysarthria - 1 11: Test Extinction/Inattention -  0 NIHSS score: 4   Imaging Reviewed:  CT HEAD WITHOUT CONTRAST 10/08/2024 06:21:00 PM   TECHNIQUE: CT of the head was performed without the administration of intravenous contrast. Automated exposure control, iterative reconstruction, and/or weight based adjustment of the mA/kV was utilized to reduce the radiation dose to as low as reasonably achievable.   COMPARISON: None available.   CLINICAL HISTORY: Neuro deficit, acute, stroke suspected.   FINDINGS:   BRAIN AND VENTRICLES: No acute hemorrhage. Remote left frontal infarct. Patchy white matter hypodensities compatible with chronic microvascular ischemic change. Remote lacunar infarct in the left basal ganglia. Partially empty sella. Calcific atherosclerosis. No evidence of acute infarct. No hydrocephalus. No extra-axial collection. No mass effect or midline shift.   ORBITS: Bilateral lens replacement.   SINUSES: No acute abnormality.   SOFT TISSUES AND SKULL: No acute soft tissue abnormality. No skull fracture.   [place] Stroke Program Early CT (ASPECT) Score: Ganglionic (caudate, internal capsule, lentiform nucleus, insula, M1-M3): 7 Supraganglionic (M4-M6): 3 Total: 10   IMPRESSION: 1. No acute intracranial abnormality. ASPECTS 10. 2. Remote left frontal infarct and remote lacunar infarct in the left basal ganglia.   Labs reviewed in epic and pertinent values follow: CBG 253   Assessment: 78 year old female with medical history significant for dementia, CVA (unknown residual-on Plavix  and Lipitor), severe multivessel coronary artery disease not a candidate for CABG because of her dementia, Carotid stenosis s/p bilateral CEA (2015,2016), ischemic cardiomyopathy with EF of 25%, known peripheral arterial disease, and recurrent lower extremity cellulitis who was at baseline today  at her facility when she went to the bathroom.  When she returned from the bathroom noted to have facial droop and difficulty with  speech (was repeating things).  EMS called at that time and patient was brought in as  a code stroke. NIHSS of 4.  Patient with mild facial droop and although she is repeating things she is also able to answer questions appropriately when asked.  Follows simple commands.  Unclear cognitive baseline.  With no focality on examination would not treat with thrombolytics.  With high mRS patient not a thrombectomy candidate.  Vascular access difficult and with that in mind will not perform acutely.  Suspect a more global etiology.  Patient is hypotensive.    Recommendations:  Agree with addressing hypotension.   Continue neuro checks ASA 325mg  now MRI of the brain without contrast If MRI of the brain indicative of an acute infarct patient to have CTA of the head and neck and stroke work up to be initiated Blood sugar management  Case discussed with Dr. Suzette    This patient is receiving care for possible acute neurological changes. Care by this provider at the time of service included time for direct evaluation via telemedicine, review of medical records, imaging studies and discussion of findings with providers, the patient and/or family.  Sonny Hock, MD Neurology   If 8pm- 8am, please page neurology on call as listed in AMION.

## 2024-10-08 NOTE — ED Provider Notes (Signed)
 [place] EMERGENCY DEPARTMENT AT [facility] Provider Note   CSN: [id number] Arrival date & time: 10/08/24  1810  An emergency department physician performed an initial assessment on this suspected stroke patient at 1814.  Patient presents with: Code Stroke   Kylie Ward is a 78 y.o. female.  {Add pertinent medical, surgical, social history, OB history to YEP:67052} Patient has a history of coronary artery disease dementia hypertension stroke and congestive heart failure.  She was found at the nursing home less responsive.  When the paramedics arrived she was having some confusion.  When she arrived to the emergency department she was repetitive with her answers.  Code stroke was called initially.  She was seen by neurology who felt like this is more of a metabolic encephalopathy.  CT head was negative  The history is provided by the EMS personnel. No language interpreter was used.  Weakness Severity:  Moderate Onset quality:  Sudden Timing:  Constant Progression:  Worsening Chronicity:  New Context: not alcohol use   Relieved by:  Nothing Worsened by:  Nothing Ineffective treatments:  None tried Associated symptoms: no abdominal pain        Prior to Admission medications   Medication Sig Start Date End Date Taking? Authorizing Provider  ALPRAZolam  (XANAX ) 1 MG tablet Take 1 tablet (1 mg total) by mouth 2 (two) times daily. 01/19/24  Yes Martin, Mary-Margaret, FNP  atorvastatin  (LIPITOR) 40 MG tablet Take 1 tablet (40 mg total) by mouth daily. Patient taking differently: Take 40 mg by mouth at bedtime. 09/17/24  Yes Martin, Mary-Margaret, FNP  clopidogrel  (PLAVIX ) 75 MG tablet Take 1 tablet (75 mg total) by mouth daily. 01/19/24  Yes Gladis, Mary-Margaret, FNP  dapagliflozin  propanediol (FARXIGA ) 10 MG TABS tablet Take 1 tablet (10 mg total) by mouth daily before breakfast. 01/19/24  Yes Gladis, Mary-Margaret, FNP  donepezil  (ARICEPT ) 10 MG tablet Take 1 tablet  (10 mg total) by mouth at bedtime. 01/19/24  Yes Gladis, Mary-Margaret, FNP  ENTRESTO  24-26 MG TAKE ONE TABLET TWICE DAILY 06/19/24  Yes Gladis, Mary-Margaret, FNP  escitalopram  (LEXAPRO ) 20 MG tablet Take 1 tablet (20 mg total) by mouth daily. 01/19/24  Yes Gladis, Mary-Margaret, FNP  Insulin  Aspart FlexPen (NOVOLOG ) 100 UNIT/ML Inject 0-10 Units into the skin 3 (three) times daily before meals. Inject as per sliding scale 150-200 = 2 units 201-250 = 4 units 251-300 = 6 units 301-350 = 8 units 351-400 = 10 units >450 = 10 units + recheck in 1 hr, if still elevated call MD 10/04/24  Yes [provider]  LANTUS  SOLOSTAR 100 UNIT/ML Solostar Pen Inject 20 Units into the skin at bedtime. 09/28/24  Yes [provider]  memantine  (NAMENDA ) 10 MG tablet Take 1 tablet (10 mg total) by mouth 2 (two) times daily. 01/19/24  Yes Gladis, Mary-Margaret, FNP  metFORMIN  (GLUCOPHAGE -XR) 750 MG 24 hr tablet TAKE ONE TABLET BY MOUTH DAILY WITH BREAKFAST 05/28/24  Yes Gladis, Mary-Margaret, FNP  metoprolol  succinate (TOPROL -XL) 25 MG 24 hr tablet Take 1 tablet (25 mg total) by mouth daily. 10/21/23  Yes Martin, Mary-Margaret, FNP  polyethylene glycol powder (GLYCOLAX /MIRALAX ) 17 GM/SCOOP powder Take 17 g by mouth daily. Patient taking differently: Take 17 g by mouth daily as needed for moderate constipation. 06/15/23  Yes Rakes, Rock HERO, FNP  spironolactone  (ALDACTONE ) 25 MG tablet Take 1 tablet (25 mg total) by mouth daily. 03/21/24  Yes Arrien, Elidia Sieving, MD  torsemide  (DEMADEX ) 20 MG tablet Take 2 tablets (40  mg total) by mouth daily. 09/20/24  Yes Martin, Mary-Margaret, FNP  traZODone  (DESYREL ) 50 MG tablet Take 1 tablet (50 mg total) by mouth at bedtime. 01/19/24  Yes Gladis, Mary-Margaret, FNP  Continuous Glucose Sensor (FREESTYLE LIBRE 2 SENSOR) MISC CHANGE SENSOR ON BACK OF ARM EVERY 14 DAYS AS DIRECTED TO CHECK BLOOD SUGAR CONTINUOUSLY 05/23/24   Gladis Mustard, FNP  EMBECTA AUTOSHIELD  DUO 30G X 5 MM MISC  09/29/24   [provider]  glucose blood (ACCU-CHEK SMARTVIEW) test strip CHECK BLOOD SUGAR UP TO 3 TIMES A DAY AS DIRECTED 07/10/21   Gladis Mustard, FNP    Allergies: Crestor  [rosuvastatin ] and Niaspan [niacin er (antihyperlipidemic)]    Review of Systems  Unable to perform ROS: Mental status change  Gastrointestinal:  Negative for abdominal pain.  Neurological:  Positive for weakness.    Updated Vital Signs BP 91/66   Pulse 70   Temp (!) 96.6 F (35.9 C)   Resp 14   Ht 5' 2 (1.575 m)   Wt 72.1 kg   LMP 01/27/1983   SpO2 98%   BMI 29.07 kg/m   Physical Exam Vitals and nursing note reviewed.  Constitutional:      Appearance: She is well-developed.  HENT:     Head: Normocephalic.     Right Ear: Tympanic membrane normal.     Left Ear: Tympanic membrane normal.     Nose: Nose normal.  Eyes:     General: No scleral icterus.    Conjunctiva/sclera: Conjunctivae normal.     Pupils: Pupils are equal, round, and reactive to light.  Neck:     Thyroid : No thyromegaly.  Cardiovascular:     Rate and Rhythm: Normal rate and regular rhythm.     Heart sounds: No murmur heard.    No friction rub. No gallop.  Pulmonary:     Breath sounds: No stridor. No wheezing or rales.  Chest:     Chest wall: No tenderness.  Abdominal:     General: There is no distension.     Tenderness: There is no abdominal tenderness. There is no rebound.  Genitourinary:    Comments: Rectal heme-negative Musculoskeletal:        General: Normal range of motion.     Cervical back: Neck supple.  Lymphadenopathy:     Cervical: No cervical adenopathy.  Skin:    Findings: No erythema or rash.  Neurological:     Mental Status: She is alert.     Motor: No abnormal muscle tone.     Coordination: Coordination normal.     Comments: Oriented to person and place  Psychiatric:        Behavior: Behavior normal.     (all labs ordered are listed, but only abnormal results  are displayed) Labs Reviewed  CBC - Abnormal; Notable for the following components:      Result Value   WBC 12.5 (*)    All other components within normal limits  DIFFERENTIAL - Abnormal; Notable for the following components:   Neutro Abs 9.9 (*)    All other components within normal limits  COMPREHENSIVE METABOLIC PANEL WITH GFR - Abnormal; Notable for the following components:   CO2 19 (*)    Glucose, Bld 263 (*)    BUN 45 (*)    Creatinine, Ser 2.61 (*)    Calcium  8.7 (*)    Alkaline Phosphatase 159 (*)    GFR, Estimated 18 (*)    Anion gap 20 (*)  All other components within normal limits  URINALYSIS, W/ REFLEX TO CULTURE (INFECTION SUSPECTED) - Abnormal; Notable for the following components:   APPearance TURBID (*)    Glucose, UA >=500 (*)    Hgb urine dipstick SMALL (*)    Leukocytes,Ua LARGE (*)    Bacteria, UA FEW (*)    All other components within normal limits  I-STAT CHEM 8, ED - Abnormal; Notable for the following components:   BUN 49 (*)    Creatinine, Ser 2.50 (*)    Glucose, Bld 263 (*)    Calcium , Ion 0.95 (*)    All other components within normal limits  CBG MONITORING, ED - Abnormal; Notable for the following components:   Glucose-Capillary 253 (*)    All other components within normal limits  TROPONIN T, HIGH SENSITIVITY - Abnormal; Notable for the following components:   Troponin T High Sensitivity 146 (*)    All other components within normal limits  POC OCCULT BLOOD, ED - Normal  CULTURE, BLOOD (ROUTINE X 2)  CULTURE, BLOOD (ROUTINE X 2)  URINE CULTURE  GASTROINTESTINAL PANEL BY PCR, STOOL (REPLACES STOOL CULTURE)  ETHANOL  LACTIC ACID, PLASMA  PROTIME-INR  APTT  URINE DRUG SCREEN  LACTIC ACID, PLASMA  TROPONIN T, HIGH SENSITIVITY    EKG: EKG Interpretation Date/Time:  Monday October 08 2024 18:55:55 EST Ventricular Rate:  66 PR Interval:  144 QRS Duration:  161 QT Interval:  502 QTC Calculation: 527 R Axis:   24  Text  Interpretation: Sinus rhythm Left bundle branch block Confirmed by Suzette Pac [phone]) on 10/08/2024 7:08:34 PM  Radiology: [facility] Chest Port 1 View Result Date: 10/08/2024 EXAM: 1 VIEW(S) XRAY OF THE CHEST 10/08/2024 08:01:00 PM COMPARISON: Chest CT without contrast 03/19/2024. CLINICAL HISTORY: weak weak FINDINGS: LINES, TUBES AND DEVICES: Overlying monitor leads. LUNGS AND PLEURA: There is a low inspiration on exam with bronchovascular crowding. No focal pneumonia is evident with limited view of the bases. No vascular congestion is seen. No pleural effusion. No pneumothorax. HEART AND MEDIASTINUM: Mild cardiomegaly. The mitral ring is heavily calcified. There is tortuosity and moderate calcification of the aorta. Stable mediastinum. BONES AND SOFT TISSUES: Degenerative change and mild levoscoliosis of the thoracic spine. IMPRESSION: 1. No acute cardiopulmonary findings. Low lung volumes. 2. Mild cardiomegaly with heavily calcified mitral valve annulus. 3. Aortic tortuosity with moderate atherosclerotic calcification. Electronically signed by: Francis Quam MD 10/08/2024 08:07 PM EST RP Workstation: [id number]   CT HEAD CODE STROKE WO CONTRAST (LKW 0-4.5h, LVO 0-24h) Result Date: 10/08/2024 EXAM: CT HEAD WITHOUT CONTRAST 10/08/2024 06:21:00 PM TECHNIQUE: CT of the head was performed without the administration of intravenous contrast. Automated exposure control, iterative reconstruction, and/or weight based adjustment of the mA/kV was utilized to reduce the radiation dose to as low as reasonably achievable. COMPARISON: None available. CLINICAL HISTORY: Neuro deficit, acute, stroke suspected. FINDINGS: BRAIN AND VENTRICLES: No acute hemorrhage. Remote left frontal infarct. Patchy white matter hypodensities compatible with chronic microvascular ischemic change. Remote lacunar infarct in the left basal ganglia. Partially empty sella. Calcific atherosclerosis. No evidence of acute infarct. No hydrocephalus. No  extra-axial collection. No mass effect or midline shift. ORBITS: Bilateral lens replacement. SINUSES: No acute abnormality. SOFT TISSUES AND SKULL: No acute soft tissue abnormality. No skull fracture. [place] Stroke Program Early CT (ASPECT) Score: Ganglionic (caudate, internal capsule, lentiform nucleus, insula, M1-M3): 7 Supraganglionic (M4-M6): 3 Total: 10 IMPRESSION: 1. No acute intracranial abnormality. ASPECTS 10. 2. Remote left frontal infarct and remote lacunar infarct  in the left basal ganglia. 3. Findings messaged to Dr. Germaine via the [facility] messaging system at 6:26 PM and called to Dr. Anala Whisenant at [facility] on 10/08/24. Electronically signed by: Donnice Mania MD 10/08/2024 06:29 PM EST RP Workstation: [id number]    {Document cardiac monitor, telemetry assessment procedure when appropriate:32947} Procedures   Medications Ordered in the ED  iohexol  (OMNIPAQUE ) 350 MG/ML injection 60 mL (has no administration in time range)  sodium chloride  0.9 % bolus 1,000 mL (1,000 mLs Intravenous Bolus 10/08/24 1858)  sodium chloride  0.9 % bolus 1,000 mL (1,000 mLs Intravenous New Bag/Given 10/08/24 1916)  cefTRIAXone  (ROCEPHIN ) 2 g in sodium chloride  0.9 % 100 mL IVPB (0 g Intravenous Stopped 10/08/24 2015)  sodium chloride  0.9 % bolus 1,000 mL (1,000 mLs Intravenous New Bag/Given 10/08/24 2038)   CRITICAL CARE Performed by: Fairy Sermon Total critical care time: 45 minutes Critical care time was exclusive of separately billable procedures and treating other patients. Critical care was necessary to treat or prevent imminent or life-threatening deterioration. Critical care was time spent personally by me on the following activities: development of treatment plan with patient and/or surrogate as well as nursing, discussions with consultants, evaluation of patient's response to treatment, examination of patient, obtaining history from patient or surrogate, ordering and performing treatments and interventions,  ordering and review of laboratory studies, ordering and review of radiographic studies, pulse oximetry and re-evaluation of patient's condition. Code stroke initially called.  Neurology saw patient and felt like it was a metabolic problem.  They recommended MRI tomorrow and stroke workup.  Patient remained hypotensive and hypothermic.  Suspect sepsis.  She responded to IV fluids with a MAP of 74 and has been started on antibiotics.  Urinalysis suggest UTI but she also has diarrhea   {Click here for ABCD2, HEART and other calculators REFRESH Note before signing:1}                              Medical Decision Making Amount and/or Complexity of Data Reviewed Labs: ordered. Radiology: ordered.   Dehydration, hypotension and most likely sepsis.  Patient responding to IV fluids and will be admitted to medicine  {Document critical care time when appropriate  Document review of labs and clinical decision tools ie [id number], etc  Document your independent review of radiology images and any outside records  Document your discussion with family members, caretakers and with consultants  Document social determinants of health affecting pt's care  Document your decision making why or why not admission, treatments were needed:32947:::1}   Final diagnoses:  None    ED Discharge Orders     None

## 2024-10-08 NOTE — ED Triage Notes (Signed)
 Pt is having speech difficulty with left facial droop. Pt lkn was 1730.

## 2024-10-08 NOTE — H&P (Addendum)
 History and Physical  Kylie Ward FMW:[id number] DOB: 03-06-1946 DOA: 10/08/2024  Referring physician: Dr. Suzette, EDP  PCP: Gladis Mustard, FNP  Outpatient Specialists: Cardiology, vascular surgery. Patient coming from: SNF.  Chief Complaint: Facial droop, speech deficit.  HPI: Kylie Ward is a 78 y.o. female with medical history significant for dementia, type 2 diabetes, hyperlipidemia, coronary artery disease (not a candidate for CABG), LBBB, chronic HFpEF, history of CVA, chronic anxiety/depression, CKD 3B, who presented to the ER with facial droop and speech deficit.  Code stroke was activated.  Seen by neurologist/stroke team.  Noncontrast head CT showed no acute intracranial abnormality, remote left frontal infarct and remote lacunar infarct in the left basal ganglia.  Upon arrival to the ER, the patient was noted to be hypotensive with SBP's in the 70s.  Blood pressure improved after IV fluid bolus.  Temperature 96.6.  Leukocytosis 12.5 K with neutrophilia on lab studies.  UA positive for pyuria.  Neurologist recommended to continue to address hypotension, neurochecks, aspirin  325 mg, MRI of brain without contrast, if positive for acute stroke then CT angio head and neck and complete stroke workup.  Due to concern for UTI, the patient received Rocephin  in the ER.  TRH, hospitalist service, was asked to admit for further management.  High-sensitivity troponin elevated 146, repeat 102.  Old LBBB on twelve-lead EKG.  The patient admits to chest pain which is reproducible on palpation.  ED Course: Temperature 96.6.  BP 103/74, pulse 67, respiration rate 14, O2 saturation 97% on room air.  Review of Systems: Review of systems as noted in the HPI. All other systems reviewed and are negative.   Past Medical History:  Diagnosis Date   Anxiety    Diabetes mellitus without complication (HCC)    Hyperlipidemia    Hypertension    Stroke [facility])    Past Surgical History:   Procedure Laterality Date   CAROTID ENDARTERECTOMY Right    CAROTID STENT     LEFT HEART CATH AND CORONARY ANGIOGRAPHY N/A 04/07/2023   Procedure: LEFT HEART CATH AND CORONARY ANGIOGRAPHY;  Surgeon: [name], MD;  Location: [facility] INVASIVE CV LAB;  Service: Cardiovascular;  Laterality: N/A;    Social History:  reports that she quit smoking about 14 years ago. Her smoking use included cigarettes. She has never used smokeless tobacco. She reports that she does not drink alcohol and does not use drugs.   Allergies  Allergen Reactions   Crestor  [Rosuvastatin ] Other (See Comments)    Myalgias and arthralgias to the point she had difficulty walking. She was tolerant of lipitor from the family's recall of events.  Lipitor was listed on MAR for allergies, NOT Crestor    Niaspan [Niacin Er (Antihyperlipidemic)] Itching    Skin burning    Family History  Problem Relation Age of Onset   Heart disease Father    Heart attack Father    Hypertension Father    Diabetes Sister    Stroke Sister    Diabetes Brother    Diabetes Sister    Diabetes Sister    Diabetes Brother    Diabetes Brother    Diabetes Brother       Prior to Admission medications   Medication Sig Start Date End Date Taking? Authorizing Provider  ALPRAZolam  (XANAX ) 1 MG tablet Take 1 tablet (1 mg total) by mouth 2 (two) times daily. 01/19/24  Yes Martin, Mary-Margaret, FNP  atorvastatin  (LIPITOR) 40 MG tablet Take 1 tablet (40 mg total) by mouth daily. Patient  taking differently: Take 40 mg by mouth at bedtime. 09/17/24  Yes Martin, Mary-Margaret, FNP  clopidogrel  (PLAVIX ) 75 MG tablet Take 1 tablet (75 mg total) by mouth daily. 01/19/24  Yes Gladis, Mary-Margaret, FNP  dapagliflozin  propanediol (FARXIGA ) 10 MG TABS tablet Take 1 tablet (10 mg total) by mouth daily before breakfast. 01/19/24  Yes Gladis, Mary-Margaret, FNP  donepezil  (ARICEPT ) 10 MG tablet Take 1 tablet (10 mg total) by mouth at bedtime. 01/19/24  Yes Gladis Mustard, FNP  ENTRESTO  24-26 MG TAKE ONE TABLET TWICE DAILY 06/19/24  Yes Gladis, Mary-Margaret, FNP  escitalopram  (LEXAPRO ) 20 MG tablet Take 1 tablet (20 mg total) by mouth daily. 01/19/24  Yes Gladis, Mary-Margaret, FNP  Insulin  Aspart FlexPen (NOVOLOG ) 100 UNIT/ML Inject 0-10 Units into the skin 3 (three) times daily before meals. Inject as per sliding scale 150-200 = 2 units 201-250 = 4 units 251-300 = 6 units 301-350 = 8 units 351-400 = 10 units >450 = 10 units + recheck in 1 hr, if still elevated call MD 10/04/24  Yes [provider]  LANTUS  SOLOSTAR 100 UNIT/ML Solostar Pen Inject 20 Units into the skin at bedtime. 09/28/24  Yes [provider]  memantine  (NAMENDA ) 10 MG tablet Take 1 tablet (10 mg total) by mouth 2 (two) times daily. 01/19/24  Yes Gladis, Mary-Margaret, FNP  metFORMIN  (GLUCOPHAGE -XR) 750 MG 24 hr tablet TAKE ONE TABLET BY MOUTH DAILY WITH BREAKFAST 05/28/24  Yes Gladis, Mary-Margaret, FNP  metoprolol  succinate (TOPROL -XL) 25 MG 24 hr tablet Take 1 tablet (25 mg total) by mouth daily. 10/21/23  Yes Martin, Mary-Margaret, FNP  polyethylene glycol powder (GLYCOLAX /MIRALAX ) 17 GM/SCOOP powder Take 17 g by mouth daily. Patient taking differently: Take 17 g by mouth daily as needed for moderate constipation. 06/15/23  Yes Rakes, Rock HERO, FNP  spironolactone  (ALDACTONE ) 25 MG tablet Take 1 tablet (25 mg total) by mouth daily. 03/21/24  Yes Arrien, Elidia Sieving, MD  torsemide  (DEMADEX ) 20 MG tablet Take 2 tablets (40 mg total) by mouth daily. 09/20/24  Yes Martin, Mary-Margaret, FNP  traZODone  (DESYREL ) 50 MG tablet Take 1 tablet (50 mg total) by mouth at bedtime. 01/19/24  Yes Gladis, Mary-Margaret, FNP  Continuous Glucose Sensor (FREESTYLE LIBRE 2 SENSOR) MISC CHANGE SENSOR ON BACK OF ARM EVERY 14 DAYS AS DIRECTED TO CHECK BLOOD SUGAR CONTINUOUSLY 05/23/24   Gladis Mustard, FNP  EMBECTA AUTOSHIELD DUO 30G X 5 MM MISC  09/29/24   [provider]  glucose blood (ACCU-CHEK SMARTVIEW) test strip CHECK BLOOD SUGAR UP TO 3 TIMES A DAY AS DIRECTED 07/10/21   Gladis Mustard, FNP    Physical Exam: BP 91/66   Pulse 70   Temp (!) 96.6 F (35.9 C)   Resp 14   Ht 5' 2 (1.575 m)   Wt 72.1 kg   LMP 01/27/1983   SpO2 98%   BMI 29.07 kg/m   General: 78 y.o. year-old female well developed well nourished in no acute distress.  Alert and oriented to self. Cardiovascular: Regular rate and rhythm with no rubs or gallops.  No thyromegaly or JVD noted.  No lower extremity edema. 2/4 pulses in all 4 extremities. Respiratory: Clear to auscultation with no wheezes or rales. Good inspiratory effort. Abdomen: Soft nontender nondistended with normal bowel sounds x4 quadrants. Muskuloskeletal: No cyanosis, clubbing or edema noted bilaterally Neuro: CN II-XII intact, strength, sensation, reflexes Skin: Erythema in lower legs towards ankles bilaterally. Psychiatry: Judgement and insight appear mildly altered.  Mood is appropriate for  condition and setting          Labs on Admission:  Basic Metabolic Panel: Recent Labs  Lab 10/08/24 1840 10/08/24 1850  NA 138 138  K 5.0 4.1  CL 98 105  CO2 19*  --   GLUCOSE 263* 263*  BUN 45* 49*  CREATININE 2.61* 2.50*  CALCIUM  8.7*  --    Liver Function Tests: Recent Labs  Lab 10/08/24 1840  AST 36  ALT 24  ALKPHOS 159*  BILITOT 0.3  PROT 7.2  ALBUMIN 4.0   No results for input(s): LIPASE, AMYLASE in the last 168 hours. No results for input(s): AMMONIA in the last 168 hours. CBC: Recent Labs  Lab 10/08/24 1840 10/08/24 1850  WBC 12.5*  --   NEUTROABS 9.9*  --   HGB 13.1 14.6  HCT 40.9 43.0  MCV 91.3  --   PLT 238  --    Cardiac Enzymes: No results for input(s): CKTOTAL, CKMB, CKMBINDEX, TROPONINI in the last 168 hours.  BNP (last 3 results) Recent Labs    02/24/24 1456 03/16/24 1443 03/30/24 1458  BNP 256.7* 435.0* 293.6*    ProBNP (last 3 results) No  results for input(s): PROBNP in the last 8760 hours.  CBG: Recent Labs  Lab 10/08/24 1819  GLUCAP 253*    Radiological Exams on Admission: DG Chest Port 1 View Result Date: 10/08/2024 EXAM: 1 VIEW(S) XRAY OF THE CHEST 10/08/2024 08:01:00 PM COMPARISON: Chest CT without contrast 03/19/2024. CLINICAL HISTORY: weak weak FINDINGS: LINES, TUBES AND DEVICES: Overlying monitor leads. LUNGS AND PLEURA: There is a low inspiration on exam with bronchovascular crowding. No focal pneumonia is evident with limited view of the bases. No vascular congestion is seen. No pleural effusion. No pneumothorax. HEART AND MEDIASTINUM: Mild cardiomegaly. The mitral ring is heavily calcified. There is tortuosity and moderate calcification of the aorta. Stable mediastinum. BONES AND SOFT TISSUES: Degenerative change and mild levoscoliosis of the thoracic spine. IMPRESSION: 1. No acute cardiopulmonary findings. Low lung volumes. 2. Mild cardiomegaly with heavily calcified mitral valve annulus. 3. Aortic tortuosity with moderate atherosclerotic calcification. Electronically signed by: Francis Quam MD 10/08/2024 08:07 PM EST RP Workstation: [id number]   CT HEAD CODE STROKE WO CONTRAST (LKW 0-4.5h, LVO 0-24h) Result Date: 10/08/2024 EXAM: CT HEAD WITHOUT CONTRAST 10/08/2024 06:21:00 PM TECHNIQUE: CT of the head was performed without the administration of intravenous contrast. Automated exposure control, iterative reconstruction, and/or weight based adjustment of the mA/kV was utilized to reduce the radiation dose to as low as reasonably achievable. COMPARISON: None available. CLINICAL HISTORY: Neuro deficit, acute, stroke suspected. FINDINGS: BRAIN AND VENTRICLES: No acute hemorrhage. Remote left frontal infarct. Patchy white matter hypodensities compatible with chronic microvascular ischemic change. Remote lacunar infarct in the left basal ganglia. Partially empty sella. Calcific atherosclerosis. No evidence of acute infarct. No  hydrocephalus. No extra-axial collection. No mass effect or midline shift. ORBITS: Bilateral lens replacement. SINUSES: No acute abnormality. SOFT TISSUES AND SKULL: No acute soft tissue abnormality. No skull fracture. [place] Stroke Program Early CT (ASPECT) Score: Ganglionic (caudate, internal capsule, lentiform nucleus, insula, M1-M3): 7 Supraganglionic (M4-M6): 3 Total: 10 IMPRESSION: 1. No acute intracranial abnormality. ASPECTS 10. 2. Remote left frontal infarct and remote lacunar infarct in the left basal ganglia. 3. Findings messaged to Dr. Germaine via the [facility] messaging system at 6:26 PM and called to Dr. Zammit at [facility] on 10/08/24. Electronically signed by: Donnice Mania MD 10/08/2024 06:29 PM EST RP Workstation: [id number]    EKG: I independently  viewed the EKG done and my findings are as followed: Sinus rhythm rate of 67.  Nonspecific ST-T changes.  Old LBBB, QTc 520.  Assessment/Plan Present on Admission:  Transient neurologic deficit  Principal Problem:   Transient neurologic deficit  Transient neurologic deficit Prior history of CVA Speech deficits has resolved. Noncontrast head CT was nonacute. Follow MRI brain, if positive, complete stroke workup Frequent neurochecks Aspirin  325 mg x 1 Swallow evaluation at bedside. Fall precautions.  Elevated troponin, suspect demand ischemia in the setting of hypotension versus active infective process. Initial troponin 146, repeat 102.  Old LBBB on twelve-lead EKG.  The patient admits to chest pain which is reproducible on palpation. Received a full dose aspirin  325 mg x 1. Home Plavix  and statin was started. The patient has significant coronary artery disease, not a candidate for CABG. Recent echocardiogram done on 10/05/2024. Follow limited echocardiogram. Consult cardiology in the morning.  Presumed UTI, POA WBC 12.5 UA positive for pyuria. Follow urine culture for ID and sensitivities Continue Rocephin  and de-escalate  antibiotics when able.  Type 2 diabetes with hyperglycemia Last hemoglobin A1c 8.8 on 03/17/2024 Heart healthy, carb diet Long-acting and short acting insulin  coverage  Dementia Reorient as needed  Coronary artery disease Not a candidate for CABG Resume home regimen. Prior to admission on Plavix  and Lipitor  Hypotension, suspect iatrogenic On multiple medications that can affect her blood pressure. Hold off oral antihypertensives for now. Blood pressure improved after IV fluid NS bolus Maintain MAP greater than 65. Midodrine  10 mg x 1 Closely monitor vital signs  AKI on CKD 3B, prerenal from poor oral intake Baseline creatinine appears to be 1.4 with GFR of 37 Presented with creatinine of 2.50 with GFR of 18 Avoid nephrotoxic agents and hypotension Monitor urine output Repeat chemistry panel in the morning.  Elevated alkaline phosphatase Alkaline phosphatase 159 Nonspecific Monitor for now  Mild high anion gap metabolic acidosis Serum bicarb 19 Anion gap 20 Continue IV fluid Repeat chemistry panel in the morning.  Chronic HFpEF Euvolemic on exam Last 2D echo done on 10/05/24 revealed LVEF 50-55%, moderate left ventricular hypertrophy. Monitor strict I's and O's and daily weight as able.  Chronic anxiety/depression Resume home regimen.  LBBB, old Noted, personally reviewed twelve-lead EKG.  QTc prolongation QTc on twelve-lead EKG 520. Avoid QTc prolonging agents Optimize magnesium and potassium levels Monitor on telemetry  Generalized weakness PT OT assessment Fall precautions.   Critical care time: 55 minutes.    DVT prophylaxis: Subcu heparin  3 times daily.  Code Status: DNR/DNI.  Family Communication: The patient's daughter and son-in-law at bedside.  Disposition Plan: Admitted to stepdown unit.  Consults called: Neurology/stroke team consulted by EDP.  Admission status: Inpatient status.   Status is: Inpatient status. The patient  requires at least 2 midnights for further evaluation and treatment of present condition.    Kylie LOISE Hurst MD [facility] Pager [phone]  If 7PM-7AM, please contact night-coverage [email or web address] Password [facility]  10/08/2024, 9:17 PM

## 2024-10-08 NOTE — ED Notes (Signed)
 Pt alerted family that chest suddenly began hurting. MD consulted.

## 2024-10-09 ENCOUNTER — Inpatient Hospital Stay (HOSPITAL_COMMUNITY)

## 2024-10-09 ENCOUNTER — Other Ambulatory Visit (HOSPITAL_COMMUNITY): Payer: Self-pay | Admitting: *Deleted

## 2024-10-09 LAB — CBC WITH DIFFERENTIAL/PLATELET
Abs Immature Granulocytes: 0.05 K/uL (ref 0.00–0.07)
Basophils Absolute: 0.1 K/uL (ref 0.0–0.1)
Basophils Relative: 0 %
Eosinophils Absolute: 0 K/uL (ref 0.0–0.5)
Eosinophils Relative: 0 %
HCT: 36.1 % (ref 36.0–46.0)
Hemoglobin: 11.9 g/dL — ABNORMAL LOW (ref 12.0–15.0)
Immature Granulocytes: 0 %
Lymphocytes Relative: 17 %
Lymphs Abs: 2.5 K/uL (ref 0.7–4.0)
MCH: 29.9 pg (ref 26.0–34.0)
MCHC: 33 g/dL (ref 30.0–36.0)
MCV: 90.7 fL (ref 80.0–100.0)
Monocytes Absolute: 0.9 K/uL (ref 0.1–1.0)
Monocytes Relative: 6 %
Neutro Abs: 10.8 K/uL — ABNORMAL HIGH (ref 1.7–7.7)
Neutrophils Relative %: 77 %
Platelets: 312 K/uL (ref 150–400)
RBC: 3.98 MIL/uL (ref 3.87–5.11)
RDW: 13 % (ref 11.5–15.5)
WBC: 14.3 K/uL — ABNORMAL HIGH (ref 4.0–10.5)
nRBC: 0 % (ref 0.0–0.2)

## 2024-10-09 LAB — GASTROINTESTINAL PANEL BY PCR, STOOL (REPLACES STOOL CULTURE)

## 2024-10-09 LAB — HEMOGLOBIN A1C
Hgb A1c MFr Bld: 9 % — ABNORMAL HIGH (ref 4.8–5.6)
Mean Plasma Glucose: 211.6 mg/dL

## 2024-10-09 LAB — GLUCOSE, CAPILLARY
Glucose-Capillary: 200 mg/dL — ABNORMAL HIGH (ref 70–99)
Glucose-Capillary: 213 mg/dL — ABNORMAL HIGH (ref 70–99)
Glucose-Capillary: 220 mg/dL — ABNORMAL HIGH (ref 70–99)
Glucose-Capillary: 223 mg/dL — ABNORMAL HIGH (ref 70–99)
Glucose-Capillary: 298 mg/dL — ABNORMAL HIGH (ref 70–99)

## 2024-10-09 LAB — MRSA NEXT GEN BY PCR, NASAL: MRSA by PCR Next Gen: NOT DETECTED

## 2024-10-09 MED ORDER — MIDODRINE HCL 5 MG PO TABS
7.5000 mg | ORAL_TABLET | Freq: Three times a day (TID) | ORAL | Status: DC
  Administered 2024-10-09 (×2): 7.5 mg via ORAL
  Filled 2024-10-09 (×2): qty 2

## 2024-10-09 MED ORDER — MIDODRINE HCL 5 MG PO TABS
10.0000 mg | ORAL_TABLET | ORAL | Status: AC
  Administered 2024-10-09: 10 mg via ORAL
  Filled 2024-10-09: qty 2

## 2024-10-09 MED ORDER — SODIUM CHLORIDE 0.9 % IV SOLN
250.0000 mL | INTRAVENOUS | Status: AC
  Administered 2024-10-09: 250 mL via INTRAVENOUS

## 2024-10-09 MED ORDER — NOREPINEPHRINE 4 MG/250ML-% IV SOLN
INTRAVENOUS | Status: AC
  Administered 2024-10-09: 2 ug/min via INTRAVENOUS
  Filled 2024-10-09: qty 250

## 2024-10-09 MED ORDER — NOREPINEPHRINE 4 MG/250ML-% IV SOLN
0.0000 ug/min | INTRAVENOUS | Status: DC
  Administered 2024-10-09: 10 ug/min via INTRAVENOUS
  Administered 2024-10-09: 6 ug/min via INTRAVENOUS
  Administered 2024-10-10: 5 ug/min via INTRAVENOUS
  Filled 2024-10-09 (×3): qty 250

## 2024-10-09 MED ORDER — SODIUM CHLORIDE 0.9 % IV BOLUS
500.0000 mL | INTRAVENOUS | Status: AC
  Administered 2024-10-09: 500 mL via INTRAVENOUS

## 2024-10-09 NOTE — TOC Initial Note (Signed)
 Transition of Care [facility]) - Initial/Assessment Note    Patient Details  Name: Kylie Ward MRN: [id number] Date of Birth: Jan 16, 1946  [facility]) CM/SW Contact:    Lucie Lunger, LCSWA Phone Number: 10/09/2024, 11:39 AM  Clinical Narrative:                 CSW notes pt arrived to hospital from [facility]. CSW spoke with pts daughter who states pt is a long term resident at the facility and plan will be for pt to return once medically stable. CSW spoke to Ozell in admissions who states pt can return once medically stable and they request auth be started. PT eval is pending at this time. TOC to follow.   Expected Discharge Plan: Long Term Nursing Home Barriers to Discharge: Continued Medical Work up   Patient Goals and CMS Choice Patient states their goals for this hospitalization and ongoing recovery are:: return to [facility] [email or web address] Compare Post Acute Care list provided to:: Patient Represenative (must comment) Choice offered to / list presented to : Adult Children      Expected Discharge Plan and Services In-house Referral: Clinical Social Work Discharge Planning Services: CM Consult Post Acute Care Choice: Nursing Home Living arrangements for the past 2 months: Skilled Nursing Facility                                      Prior Living Arrangements/Services Living arrangements for the past 2 months: Skilled Nursing Facility Lives with:: Facility Resident Patient language and need for interpreter reviewed:: Yes Do you feel safe going back to the place where you live?: Yes      Need for Family Participation in Patient Care: Yes (Comment) Care giver support system in place?: Yes (comment) Current home services: DME Criminal Activity/Legal Involvement Pertinent to Current Situation/Hospitalization: No - Comment as needed  Activities of Daily Living   ADL Screening (condition at time of admission) Independently performs ADLs?: Yes  (appropriate for developmental age) Is the patient deaf or have difficulty hearing?: No Does the patient have difficulty seeing, even when wearing glasses/contacts?: No Does the patient have difficulty concentrating, remembering, or making decisions?: Yes  Permission Sought/Granted                  Emotional Assessment Appearance:: Appears stated age Attitude/Demeanor/Rapport: Engaged Affect (typically observed): Accepting   Alcohol / Substance Use: Not Applicable Psych Involvement: No (comment)  Admission diagnosis:  Transient neurologic deficit [R29.818] Patient Active Problem List   Diagnosis Date Noted   Transient neurologic deficit 10/08/2024   Coronary artery disease 03/17/2024   Acute on chronic systolic CHF (congestive heart failure) (HCC) 03/17/2024   Cellulitis of right lower extremity 03/17/2024   CKD stage 3b, GFR 30-44 ml/min (HCC) 03/17/2024   Acute exacerbation of CHF (congestive heart failure) (HCC) 03/16/2024   Primary insomnia 05/03/2023   Chronic midline low back pain without sciatica 05/03/2023   Coronary artery disease involving native coronary artery of native heart with unstable angina pectoris (HCC) 04/08/2023   Dementia without behavioral disturbance (HCC) 04/07/2023   Drug-induced myopathy 10/30/2020   Type 2 diabetes mellitus with diabetic polyneuropathy, with long-term current use of insulin  (HCC) 03/06/2018   Atherosclerosis of aorta 09/04/2016   Peripheral edema 11/13/2015   GAD (generalized anxiety disorder) 08/12/2015   BMI 31.0-31.9,adult 05/08/2015   Carotid artery stenosis, symptomatic 01/14/2014  History of CVA (cerebrovascular accident) 12/10/2013   Hyperlipidemia associated with type 2 diabetes mellitus (HCC) 01/26/2013   Essential hypertension, benign 01/26/2013   PCP:  Gladis Mustard, FNP Pharmacy:   [facility] [place], [place] - [address] [place] [address] Phone:  [phone] Fax: [phone]     Social Drivers of Health (SDOH) Social History: SDOH Screenings   Food Insecurity: No Food Insecurity (10/08/2024)  Housing: Low Risk  (10/08/2024)  Transportation Needs: No Transportation Needs (10/08/2024)  Utilities: Not At Risk (10/08/2024)  Alcohol Screen: Low Risk  (05/07/2021)  Depression (PHQ2-9): Medium Risk (07/05/2024)  Financial Resource Strain: Low Risk  (05/10/2022)  Physical Activity: Inactive (05/10/2022)  Social Connections: Socially Isolated (10/08/2024)  Stress: No Stress Concern Present (05/10/2022)  Tobacco Use: Medium Risk (10/08/2024)   SDOH Interventions:     Readmission Risk Interventions    10/09/2024   10:55 AM  Readmission Risk Prevention Plan  Transportation Screening Complete  Home Care Screening Complete  Medication Review (RN CM) Complete

## 2024-10-09 NOTE — Plan of Care (Signed)
  Problem: Acute Rehab PT Goals(only PT should resolve) Goal: Pt Will Go Supine/Side To Sit Outcome: Progressing Flowsheets (Taken 10/09/2024 1427) Pt will go Supine/Side to Sit: with supervision Goal: Patient Will Transfer Sit To/From Stand Outcome: Progressing Flowsheets (Taken 10/09/2024 1427) Patient will transfer sit to/from stand: with supervision Goal: Pt Will Transfer Bed To Chair/Chair To Bed Outcome: Progressing Flowsheets (Taken 10/09/2024 1427) Pt will Transfer Bed to Chair/Chair to Bed: with supervision Goal: Pt Will Ambulate Outcome: Progressing Flowsheets (Taken 10/09/2024 1427) Pt will Ambulate:  75 feet  with supervision  with contact guard assist  with rolling walker   2:27 PM, 10/09/24 Lynwood Music, MPT Physical Therapist with [facility] 336 [phone] office [phone] mobile phone

## 2024-10-09 NOTE — Plan of Care (Signed)
  Problem: Education: Goal: Knowledge of General Education information will improve Description: Including pain rating scale, medication(s)/side effects and non-pharmacologic comfort measures Outcome: Progressing   Problem: Health Behavior/Discharge Planning: Goal: Ability to manage health-related needs will improve Outcome: Progressing   Problem: Clinical Measurements: Goal: Ability to maintain clinical measurements within normal limits will improve Outcome: Progressing Goal: Will remain free from infection Outcome: Progressing Goal: Diagnostic test results will improve Outcome: Progressing Goal: Respiratory complications will improve Outcome: Progressing Goal: Cardiovascular complication will be avoided Outcome: Progressing   Problem: Activity: Goal: Risk for activity intolerance will decrease Outcome: Progressing   Problem: Nutrition: Goal: Adequate nutrition will be maintained Outcome: Progressing   Problem: Coping: Goal: Level of anxiety will decrease Outcome: Progressing   Problem: Elimination: Goal: Will not experience complications related to bowel motility Outcome: Progressing Goal: Will not experience complications related to urinary retention Outcome: Progressing   Problem: Pain Managment: Goal: General experience of comfort will improve and/or be controlled Outcome: Progressing   Problem: Safety: Goal: Ability to remain free from injury will improve Outcome: Not Progressing   Problem: Skin Integrity: Goal: Risk for impaired skin integrity will decrease Outcome: Not Progressing   Problem: Education: Goal: Ability to describe self-care measures that may prevent or decrease complications (Diabetes Survival Skills Education) will improve Outcome: Not Progressing Goal: Individualized Educational Video(s) Outcome: Not Progressing   Problem: Coping: Goal: Ability to adjust to condition or change in health will improve Outcome: Progressing   Problem:  Fluid Volume: Goal: Ability to maintain a balanced intake and output will improve Outcome: Progressing   Problem: Health Behavior/Discharge Planning: Goal: Ability to identify and utilize available resources and services will improve Outcome: Progressing Goal: Ability to manage health-related needs will improve Outcome: Progressing   Problem: Metabolic: Goal: Ability to maintain appropriate glucose levels will improve Outcome: Progressing   Problem: Nutritional: Goal: Maintenance of adequate nutrition will improve Outcome: Progressing Goal: Progress toward achieving an optimal weight will improve Outcome: Progressing   Problem: Skin Integrity: Goal: Risk for impaired skin integrity will decrease Outcome: Progressing   Problem: Tissue Perfusion: Goal: Adequacy of tissue perfusion will improve Outcome: Not Progressing

## 2024-10-09 NOTE — Plan of Care (Signed)

## 2024-10-09 NOTE — Plan of Care (Signed)
  Problem: Acute Rehab OT Goals (only OT should resolve) Goal: Pt. Will Perform Grooming Flowsheets (Taken 10/09/2024 1114) Pt Will Perform Grooming:  with set-up  sitting Goal: Pt. Will Perform Upper Body Dressing Flowsheets (Taken 10/09/2024 1114) Pt Will Perform Upper Body Dressing:  with set-up  sitting Goal: Pt. Will Perform Lower Body Dressing Flowsheets (Taken 10/09/2024 1114) Pt Will Perform Lower Body Dressing:  with min assist  with adaptive equipment  sitting/lateral leans Goal: Pt. Will Transfer To Toilet Flowsheets (Taken 10/09/2024 1114) Pt Will Transfer to Toilet:  with supervision  ambulating Goal: Pt/Caregiver Will Perform Home Exercise Program Flowsheets (Taken 10/09/2024 1114) Pt/caregiver will Perform Home Exercise Program:  Increased strength  Both right and left upper extremity  Independently  Gabino Hagin OT, MOT

## 2024-10-09 NOTE — Evaluation (Signed)
 Physical Therapy Evaluation Patient Details Name: Kylie Ward MRN: [id number] DOB: Apr 07, 1946 Today's Date: 10/09/2024  History of Present Illness  Kylie Ward is a 78 y.o. female with medical history significant for dementia, type 2 diabetes, hyperlipidemia, coronary artery disease (not a candidate for CABG), LBBB, chronic HFpEF, history of CVA, chronic anxiety/depression, CKD 3B, who presented to the ER with facial droop and speech deficit.  Code stroke was activated.  Seen by neurologist/stroke team.  Noncontrast head CT showed no acute intracranial abnormality, remote left frontal infarct and remote lacunar infarct in the left basal ganglia.   Clinical Impression  Patient functioning near baseline for functional mobility and gait demonstrating fair/good return for bed mobility, transfers and able to ambulate in room, hallway with slow labored movement, but no loss of balance and limited mostly due to fatigue. Patient tolerated sitting up in chair after therapy with her daughter present. Patient will benefit from continued skilled physical therapy in hospital and recommended venue below to increase strength, balance, endurance for safe ADLs and gait.           If plan is discharge home, recommend the following: A little help with walking and/or transfers;A little help with bathing/dressing/bathroom;Help with stairs or ramp for entrance;Assist for transportation;Assistance with cooking/housework   Can travel by private vehicle   Yes    Equipment Recommendations None recommended by PT  Recommendations for Other Services       Functional Status Assessment Patient has had a recent decline in their functional status and/or demonstrates limited ability to make significant improvements in function in a reasonable and predictable amount of time     Precautions / Restrictions Precautions Precautions: Fall Recall of Precautions/Restrictions: Impaired Restrictions Weight Bearing  Restrictions Per Provider Order: No      Mobility  Bed Mobility Overal bed mobility: Needs Assistance Bed Mobility: Supine to Sit     Supine to sit: Min assist     General bed mobility comments: labored movement    Transfers Overall transfer level: Needs assistance Equipment used: Rolling walker (2 wheels) Transfers: Sit to/from Stand, Bed to chair/wheelchair/BSC Sit to Stand: Contact guard assist, Min assist   Step pivot transfers: Min assist, Contact guard assist       General transfer comment: slightly labored movement using RW    Ambulation/Gait Ambulation/Gait assistance: Contact guard assist, Min assist Gait Distance (Feet): 45 Feet Assistive device: Rolling walker (2 wheels) Gait Pattern/deviations: Decreased step length - right, Decreased step length - left, Decreased stride length Gait velocity: decreased     General Gait Details: slow labored movement without loss of balance, but limited mostly due to fatigue  Stairs            Wheelchair Mobility     Tilt Bed    Modified Rankin (Stroke Patients Only)       Balance Overall balance assessment: Needs assistance Sitting-balance support: Feet supported, No upper extremity supported Sitting balance-Leahy Scale: Fair Sitting balance - Comments: seated at EOB   Standing balance support: Bilateral upper extremity supported, During functional activity, Reliant on assistive device for balance Standing balance-Leahy Scale: Fair Standing balance comment: using RW                             Pertinent Vitals/Pain Pain Assessment Pain Assessment: No/denies pain    Home Living Family/patient expects to be discharged to:: Skilled nursing facility  Additional Comments: Pt is a long term SNF resident starting last month per daughter's report.    Prior Function Prior Level of Function : Needs assist  Cognitive Assist : ADLs (cognitive)     Physical Assist :  ADLs (physical);Mobility (physical) Mobility (physical): Transfers;Gait;Stairs;Bed mobility ADLs (physical): Bathing;Dressing;Grooming;Toileting;IADLs Mobility Comments: Ambulates with RW at the SNF. ADLs Comments: Able to feed self only, assisted for all other ADL's; cognition limits ADL function as well.     Extremity/Trunk Assessment   Upper Extremity Assessment Upper Extremity Assessment: Defer to OT evaluation LUE Deficits / Details: Mild sensation deficit at the shoulder. LUE Sensation: decreased light touch    Lower Extremity Assessment Lower Extremity Assessment: Generalized weakness    Cervical / Trunk Assessment Cervical / Trunk Assessment: Normal  Communication   Communication Communication: No apparent difficulties    Cognition Arousal: Alert Behavior During Therapy: WFL for tasks assessed/performed   PT - Cognitive impairments: History of cognitive impairments                         Following commands: Intact       Cueing Cueing Techniques: Verbal cues, Gestural cues, Tactile cues, Visual cues     General Comments      Exercises     Assessment/Plan    PT Assessment Patient needs continued PT services  PT Problem List Decreased strength;Decreased activity tolerance;Decreased balance;Decreased mobility       PT Treatment Interventions DME instruction;Gait training;Stair training;Functional mobility training;Therapeutic activities;Therapeutic exercise;Balance training;Patient/family education    PT Goals (Current goals can be found in the Care Plan section)  Acute Rehab PT Goals Patient Stated Goal: return home with LTC SNF staff to assist PT Goal Formulation: With patient/family Time For Goal Achievement: 10/12/24 Potential to Achieve Goals: Good    Frequency Min 3X/week     Co-evaluation PT/OT/SLP Co-Evaluation/Treatment: Yes Reason for Co-Treatment: To address functional/ADL transfers PT goals addressed during session:  Mobility/safety with mobility;Balance;Proper use of DME OT goals addressed during session: ADL's and self-care       AM-PAC PT 6 Clicks Mobility  Outcome Measure Help needed turning from your back to your side while in a flat bed without using bedrails?: A Little Help needed moving from lying on your back to sitting on the side of a flat bed without using bedrails?: A Little Help needed moving to and from a bed to a chair (including a wheelchair)?: A Little Help needed standing up from a chair using your arms (e.g., wheelchair or bedside chair)?: A Little Help needed to walk in hospital room?: A Little Help needed climbing 3-5 steps with a railing? : A Lot 6 Click Score: 17    End of Session   Activity Tolerance: Patient tolerated treatment well;Patient limited by fatigue Patient left: in chair;with call bell/phone within reach;with family/visitor present Nurse Communication: Mobility status PT Visit Diagnosis: Unsteadiness on feet (R26.81);Other abnormalities of gait and mobility (R26.89);Muscle weakness (generalized) (M62.81)    Time: 9059-8995 PT Time Calculation (min) (ACUTE ONLY): 24 min   Charges:   PT Evaluation $PT Eval Moderate Complexity: 1 Mod PT Treatments $Therapeutic Exercise: 23-37 mins PT General Charges $$ ACUTE PT VISIT: 1 Visit         2:26 PM, 10/09/24 Lynwood Music, MPT Physical Therapist with [facility] 336 [phone] office [phone] mobile phone

## 2024-10-09 NOTE — Evaluation (Signed)
 Occupational Therapy Evaluation Patient Details Name: Kylie Ward MRN: [id number] DOB: Nov 01, 1946 Today's Date: 10/09/2024   History of Present Illness   Kylie Ward is a 78 y.o. female with medical history significant for dementia, type 2 diabetes, hyperlipidemia, coronary artery disease (not a candidate for CABG), LBBB, chronic HFpEF, history of CVA, chronic anxiety/depression, CKD 3B, who presented to the ER with facial droop and speech deficit.  Code stroke was activated.  Seen by neurologist/stroke team.  Noncontrast head CT showed no acute intracranial abnormality, remote left frontal infarct and remote lacunar infarct in the left basal ganglia. (per DO)     Clinical Impressions Pt agreeable to OT and PT co-evaluation. Pt's daughter in the room to provide living history. Pt is from SNF with plans to return as a long term resident. Pt required min A for bed mobility and CGA to min A for functional transfers/ambualtion in the room. Pt is assisted for ADL's at baseline, with cognition being a limiting factor as well as physical ability. Pt demonstrates need for max A for lower body ADL's and min A for upper body dressing/bathing. Pt does not demonstrate unilateral weakness but did have mild decreased sensation in L shoulder. Pt left in the chair with family present. Pt will benefit from continued OT in the hospital to increase strength, balance, and endurance for safe ADL's.        If plan is discharge home, recommend the following:   A little help with walking and/or transfers;A lot of help with bathing/dressing/bathroom;Assistance with cooking/housework;Direct supervision/assist for medications management;Direct supervision/assist for financial management;Assist for transportation;Supervision due to cognitive status;Help with stairs or ramp for entrance     Functional Status Assessment   Patient has had a recent decline in their functional status and demonstrates the  ability to make significant improvements in function in a reasonable and predictable amount of time.     Equipment Recommendations   None recommended by OT             Precautions/Restrictions   Precautions Precautions: Fall Recall of Precautions/Restrictions: Impaired Restrictions Weight Bearing Restrictions Per Provider Order: No     Mobility Bed Mobility Overal bed mobility: Needs Assistance Bed Mobility: Supine to Sit     Supine to sit: Min assist     General bed mobility comments: labored movement    Transfers Overall transfer level: Needs assistance Equipment used: Rolling walker (2 wheels) Transfers: Sit to/from Stand, Bed to chair/wheelchair/BSC Sit to Stand: Contact guard assist, Min assist     Step pivot transfers: Min assist, Contact guard assist     General transfer comment: EOB to chair with RW      Balance Overall balance assessment: Needs assistance Sitting-balance support: No upper extremity supported, Feet supported Sitting balance-Leahy Scale: Fair Sitting balance - Comments: seated at EOB   Standing balance support: Bilateral upper extremity supported, During functional activity, Reliant on assistive device for balance Standing balance-Leahy Scale: Fair Standing balance comment: using RW                           ADL either performed or assessed with clinical judgement   ADL Overall ADL's : Needs assistance/impaired     Grooming: Set up;Contact guard assist;Sitting   Upper Body Bathing: Set up;Minimal assistance;Sitting   Lower Body Bathing: Maximal assistance;Sitting/lateral leans   Upper Body Dressing : Set up;Minimal assistance;Sitting   Lower Body Dressing: Maximal assistance;Sitting/lateral leans   Toilet Transfer:  Contact guard assist;Minimal assistance;Rolling walker (2 wheels);Ambulation   Toileting- Clothing Manipulation and Hygiene: Maximal assistance;Total assistance;Bed level       Functional  mobility during ADLs: Minimal assistance;Contact guard assist;Rolling walker (2 wheels)       Vision Baseline Vision/History:  (Daughter reports pt complains of blurry vision, but does not  have glasses.) Ability to See in Adequate Light: 1 Impaired Patient Visual Report: No change from baseline Vision Assessment?: No apparent visual deficits     Perception Perception: Not tested       Praxis Praxis: Not tested       Pertinent Vitals/Pain Pain Assessment Pain Assessment: No/denies pain     Extremity/Trunk Assessment Upper Extremity Assessment Upper Extremity Assessment: Generalized weakness;LUE deficits/detail LUE Deficits / Details: Mild sensation deficit at the shoulder. LUE Sensation: decreased light touch   Lower Extremity Assessment Lower Extremity Assessment: Defer to PT evaluation   Cervical / Trunk Assessment Cervical / Trunk Assessment: Normal   Communication Communication Communication: No apparent difficulties   Cognition Arousal: Alert Behavior During Therapy: WFL for tasks assessed/performed Cognition: History of cognitive impairments             OT - Cognition Comments: Baseline dementia.                 Following commands: Intact       Cueing  General Comments   Cueing Techniques: Verbal cues;Gestural cues;Tactile cues;Visual cues                 Home Living Family/patient expects to be discharged to:: Skilled nursing facility                                 Additional Comments: Pt is a long term SNF resident starting last month per daughter's report.      Prior Functioning/Environment Prior Level of Function : Needs assist  Cognitive Assist : ADLs (cognitive)     Physical Assist : ADLs (physical);Mobility (physical) Mobility (physical): Transfers;Gait;Stairs ADLs (physical): Bathing;Dressing;Grooming;Toileting;IADLs Mobility Comments: Ambulates with RW at the SNF. ADLs Comments: Able to feed self only,  assisted for all other ADL's; cognition limits ADL function as well.    OT Problem List: Decreased strength;Decreased activity tolerance;Impaired balance (sitting and/or standing);Decreased cognition   OT Treatment/Interventions: Self-care/ADL training;Therapeutic exercise;Therapeutic activities;Patient/family education;Balance training;DME and/or AE instruction;Cognitive remediation/compensation;Neuromuscular education      OT Goals(Current goals can be found in the care plan section)   Acute Rehab OT Goals Patient Stated Goal: Return to SNF OT Goal Formulation: With family Time For Goal Achievement: 10/23/24 Potential to Achieve Goals: Good   OT Frequency:  Min 2X/week    Co-evaluation PT/OT/SLP Co-Evaluation/Treatment: Yes Reason for Co-Treatment: To address functional/ADL transfers   OT goals addressed during session: ADL's and self-care                       End of Session Equipment Utilized During Treatment: Rolling walker (2 wheels);Gait belt  Activity Tolerance: Patient tolerated treatment well Patient left: in chair;with call bell/phone within reach;with family/visitor present  OT Visit Diagnosis: Unsteadiness on feet (R26.81);Other abnormalities of gait and mobility (R26.89);Muscle weakness (generalized) (M62.81);Other symptoms and signs involving the nervous system (R29.898)                Time: 9056-8994 OT Time Calculation (min): 22 min Charges:  OT General Charges $OT Visit: 1 Visit OT Evaluation $OT Eval Low  Complexity: 1 Low  Tria Noguera OT, MOT  Jayson Person 10/09/2024, 11:13 AM

## 2024-10-09 NOTE — Progress Notes (Signed)
 The patient was started on vasopressors, Levophed , due to hypotension with MAP in the 40s.  Will continue to closely monitor and treat as indicated.  Bed status changed to ICU.  No charge note.

## 2024-10-09 NOTE — Progress Notes (Signed)
 PROGRESS NOTE    Kylie Ward  FMW:[id number] DOB: 03-Dec-1945 DOA: 10/08/2024 PCP: Gladis Mustard, FNP   Chief Complaint  Patient presents with   Code Stroke    Brief Narrative:  As per H&P written by Dr. Shona on 10/08/2024 Kylie Ward is a 78 y.o. female with medical history significant for dementia, type 2 diabetes, hyperlipidemia, coronary artery disease (not a candidate for CABG), LBBB, chronic HFpEF, history of CVA, chronic anxiety/depression, CKD 3B, who presented to the ER with facial droop and speech deficit.  Code stroke was activated.  Seen by neurologist/stroke team.  Noncontrast head CT showed no acute intracranial abnormality, remote left frontal infarct and remote lacunar infarct in the left basal ganglia.   Upon arrival to the ER, the patient was noted to be hypotensive with SBP's in the 70s.  Blood pressure improved after IV fluid bolus.  Temperature 96.6.  Leukocytosis 12.5 K with neutrophilia on lab studies.  UA positive for pyuria.   Neurologist recommended to continue to address hypotension, neurochecks, aspirin  325 mg, MRI of brain without contrast, if positive for acute stroke then CT angio head and neck and complete stroke workup.   Due to concern for UTI, the patient received Rocephin  in the ER.  TRH, hospitalist service, was asked to admit for further management.   High-sensitivity troponin elevated 146, repeat 102.  Old LBBB on twelve-lead EKG.  The patient admits to chest pain which is reproducible on palpation.   ED Course: Temperature 96.6.  BP 103/74, pulse 67, respiration rate 14, O2 saturation 97% on room air.    Assessment & Plan: 1-TIA/strokelike symptoms - Patient with prior history of CVA - CT head demonstrating no acute intracranial abnormalities - For the most part her neurologic deficits had resolved by now - Following neurology recommendations will check MRI and if positive completed stroke workup - Continue Refacto  modification - Continue to maintain adequate blood pressure.  2-presumed UTI - Patient denying dysuria at the moment - Urinalysis suggesting the presence of infection with positive pyuria; cultures pending at the moment. - Empirically receiving ceftriaxone  - Maintain adequate hydration.  3-hypotension - Underlying history of heart failure - Significant amount of medications that can affect blood pressure - Requiring Levophed  and will also use midodrine  to stabilize BP - Continue to hold antihypertensive/diuretic regimen currently.  4-dementia - No behavioral disturbances currently appreciated - Continue constant reorientation - Continue supportive care.  5-type 2 diabetes mellitus - Recent A1c 8.8 - Continue sliding scale insulin  - Follow CBG fluctuation.  6-acute kidney injury on chronic kidney disease stage 3B - Maintain adequate hydration - Minimize the use of nephrotoxic agents and contrast - Avoid hypotension as much as possible - Follow renal function trend.  7-chronic history of combined heart failure - Most recent echo with preserved ejection fraction 50 to 55% at the beginning of December. - There is moderate left ventricular hypertrophy - Continue to follow daily weights/strict intake and output - Planning to resume home medications as slowly as blood pressure allow.  8-chronic anxiety/depression - Continue home medication regimen - Mood currently stable.  9-generalized weakness - Patient from the skilled nursing facility at baseline - Using walker for very short distance ambulation with assistance at baseline - Once medically stable PT evaluation will be requested - Anticipate discharge back to skilled nursing facility for further care.    DVT prophylaxis: Heparin . Code Status: DNR/DNI. Family Communication: Daughter at bedside. Disposition:   Status is: Inpatient Remains inpatient appropriate  because: Continue IV therapy.   Consultants:  Neurology  service consulted by EDP.  Procedures:  See below for x-ray reports.  Antimicrobials:  Ceftriaxone    Subjective: In no acute distress, afebrile, no chest pain, no nausea, no vomiting.  Mentation has improved from per daughter back to baseline currently.  Requiring peripheral Levophed  to keep stable blood pressure.  Objective: Vitals:   10/09/24 0930 10/09/24 0945 10/09/24 0958 10/09/24 1000  BP: (!) 80/60 106/80 (!) 82/63 (!) 82/63  Pulse: 74 80 85 80  Resp: 18 13 (!) 24 18  Temp: 98.4 F (36.9 C) 98.4 F (36.9 C)    TempSrc:      SpO2: 98% 96% 98% 98%  Weight:      Height:        Intake/Output Summary (Last 24 hours) at 10/09/2024 1013 Last data filed at 10/09/2024 1000 Gross per 24 hour  Intake 952.53 ml  Output 1475 ml  Net -522.47 ml   Filed Weights   10/08/24 1812 10/09/24 0502  Weight: 72.1 kg 69.7 kg    Examination:  General exam: Appears calm and comfortable; demonstrating good articulation and per daughter at bedside back to baseline mentation. Respiratory system: Good saturation on room air. Cardiovascular system: S1 & S2 heard, RRR. No JVD, murmurs, rubs, gallops or clicks.  Trace edema appreciated bilaterally. Gastrointestinal system: Abdomen is nondistended, soft and nontender. No organomegaly or masses felt. Normal bowel sounds heard. Central nervous system: Moving 4 limbs spontaneously.  No focal neurological deficits. Extremities: No cyanosis or clubbing. Skin: No petechiae. Psychiatry: Judgement and insight appear impaired secondary to underlying dementia; stable and following simple commands.   Data Reviewed: I have personally reviewed following labs and imaging studies  CBC: Recent Labs  Lab 10/08/24 1840 10/08/24 1850 10/09/24 0441  WBC 12.5*  --  14.3*  NEUTROABS 9.9*  --  10.8*  HGB 13.1 14.6 11.9*  HCT 40.9 43.0 36.1  MCV 91.3  --  90.7  PLT 238  --  312    Basic Metabolic Panel: Recent Labs  Lab 10/08/24 1840 10/08/24 1850   NA 138 138  K 5.0 4.1  CL 98 105  CO2 19*  --   GLUCOSE 263* 263*  BUN 45* 49*  CREATININE 2.61* 2.50*  CALCIUM  8.7*  --     GFR: Estimated Creatinine Clearance: 17.5 mL/min (A) (by C-G formula based on SCr of 2.5 mg/dL (H)).  Liver Function Tests: Recent Labs  Lab 10/08/24 1840  AST 36  ALT 24  ALKPHOS 159*  BILITOT 0.3  PROT 7.2  ALBUMIN 4.0    CBG: Recent Labs  Lab 10/08/24 1819 10/08/24 2221 10/08/24 2359 10/09/24 0753  GLUCAP 253* 206* 200* 213*     Recent Results (from the past 240 hours)  Blood culture (routine x 2)     Status: None (Preliminary result)   Collection Time: 10/08/24  7:29 PM   Specimen: BLOOD  Result Value Ref Range Status   Specimen Description BLOOD BLOOD LEFT WRIST  Final   Special Requests   Final    BOTTLES DRAWN AEROBIC ONLY Blood Culture results may not be optimal due to an inadequate volume of blood received in culture bottles   Culture   Final    NO GROWTH < 12 HOURS Performed at [facility], [address] [place], [place] [address]    Report Status PENDING  Incomplete  Blood culture (routine x 2)     Status: None (Preliminary result)  Collection Time: 10/08/24  7:29 PM   Specimen: BLOOD  Result Value Ref Range Status   Specimen Description BLOOD RIGHT ANTECUBITAL  Final   Special Requests   Final    BOTTLES DRAWN AEROBIC AND ANAEROBIC Blood Culture results may not be optimal due to an inadequate volume of blood received in culture bottles   Culture   Final    NO GROWTH < 12 HOURS Performed at [facility], [address] [place], [place] [address]    Report Status PENDING  Incomplete    Radiology Studies: DG Chest Port 1 View Result Date: 10/08/2024 EXAM: 1 VIEW(S) XRAY OF THE CHEST 10/08/2024 08:01:00 PM COMPARISON: Chest CT without contrast 03/19/2024. CLINICAL HISTORY: weak weak FINDINGS: LINES, TUBES AND DEVICES: Overlying monitor leads. LUNGS AND PLEURA: There is a low inspiration on exam with bronchovascular  crowding. No focal pneumonia is evident with limited view of the bases. No vascular congestion is seen. No pleural effusion. No pneumothorax. HEART AND MEDIASTINUM: Mild cardiomegaly. The mitral ring is heavily calcified. There is tortuosity and moderate calcification of the aorta. Stable mediastinum. BONES AND SOFT TISSUES: Degenerative change and mild levoscoliosis of the thoracic spine. IMPRESSION: 1. No acute cardiopulmonary findings. Low lung volumes. 2. Mild cardiomegaly with heavily calcified mitral valve annulus. 3. Aortic tortuosity with moderate atherosclerotic calcification. Electronically signed by: Francis Quam MD 10/08/2024 08:07 PM EST RP Workstation: [id number]   CT HEAD CODE STROKE WO CONTRAST (LKW 0-4.5h, LVO 0-24h) Result Date: 10/08/2024 EXAM: CT HEAD WITHOUT CONTRAST 10/08/2024 06:21:00 PM TECHNIQUE: CT of the head was performed without the administration of intravenous contrast. Automated exposure control, iterative reconstruction, and/or weight based adjustment of the mA/kV was utilized to reduce the radiation dose to as low as reasonably achievable. COMPARISON: None available. CLINICAL HISTORY: Neuro deficit, acute, stroke suspected. FINDINGS: BRAIN AND VENTRICLES: No acute hemorrhage. Remote left frontal infarct. Patchy white matter hypodensities compatible with chronic microvascular ischemic change. Remote lacunar infarct in the left basal ganglia. Partially empty sella. Calcific atherosclerosis. No evidence of acute infarct. No hydrocephalus. No extra-axial collection. No mass effect or midline shift. ORBITS: Bilateral lens replacement. SINUSES: No acute abnormality. SOFT TISSUES AND SKULL: No acute soft tissue abnormality. No skull fracture. [place] Stroke Program Early CT (ASPECT) Score: Ganglionic (caudate, internal capsule, lentiform nucleus, insula, M1-M3): 7 Supraganglionic (M4-M6): 3 Total: 10 IMPRESSION: 1. No acute intracranial abnormality. ASPECTS 10. 2. Remote left frontal  infarct and remote lacunar infarct in the left basal ganglia. 3. Findings messaged to Dr. Germaine via the [facility] messaging system at 6:26 PM and called to Dr. Zammit at [facility] on 10/08/24. Electronically signed by: Donnice Mania MD 10/08/2024 06:29 PM EST RP Workstation: [id number]   Scheduled Meds:  atorvastatin   40 mg Oral Daily   Chlorhexidine  Gluconate Cloth  6 each Topical Daily   clopidogrel   75 mg Oral Daily   escitalopram   20 mg Oral Daily   heparin   5,000 Units Subcutaneous Q8H   insulin  aspart  0-5 Units Subcutaneous QHS   insulin  aspart  0-9 Units Subcutaneous TID WC   insulin  glargine  3 Units Subcutaneous BID   memantine   10 mg Oral BID   midodrine   7.5 mg Oral TID WC   Continuous Infusions:  sodium chloride  50 mL/hr at 10/09/24 0701   cefTRIAXone  (ROCEPHIN )  IV     norepinephrine  (LEVOPHED ) Adult infusion 8 mcg/min (10/09/24 1000)     LOS: 1 day    Time spent: 50 minutes    Eric Nunnery,  MD [facility]   To contact the attending provider between 7A-7P or the covering provider during after hours 7P-7A, please log into the web site [email or web address] and access using universal [place] password for that web site. If you do not have the password, please call the hospital operator.  10/09/2024, 10:13 AM

## 2024-10-10 ENCOUNTER — Ambulatory Visit (HOSPITAL_COMMUNITY)

## 2024-10-10 ENCOUNTER — Inpatient Hospital Stay (HOSPITAL_COMMUNITY)

## 2024-10-10 ENCOUNTER — Ambulatory Visit: Admitting: Vascular Surgery

## 2024-10-10 DIAGNOSIS — I9589 Other hypotension: Secondary | ICD-10-CM | POA: Diagnosis not present

## 2024-10-10 DIAGNOSIS — R7989 Other specified abnormal findings of blood chemistry: Secondary | ICD-10-CM

## 2024-10-10 DIAGNOSIS — R29818 Other symptoms and signs involving the nervous system: Secondary | ICD-10-CM | POA: Diagnosis not present

## 2024-10-10 LAB — GLUCOSE, CAPILLARY
Glucose-Capillary: 226 mg/dL — ABNORMAL HIGH (ref 70–99)
Glucose-Capillary: 153 mg/dL — ABNORMAL HIGH (ref 70–99)
Glucose-Capillary: 131 mg/dL — ABNORMAL HIGH (ref 70–99)
Glucose-Capillary: 196 mg/dL — ABNORMAL HIGH (ref 70–99)

## 2024-10-10 LAB — CBC
HCT: 39.4 % (ref 36.0–46.0)
Hemoglobin: 12.5 g/dL (ref 12.0–15.0)
MCH: 29.1 pg (ref 26.0–34.0)
MCHC: 31.7 g/dL (ref 30.0–36.0)
MCV: 91.6 fL (ref 80.0–100.0)
Platelets: 297 K/uL (ref 150–400)
RBC: 4.3 MIL/uL (ref 3.87–5.11)
RDW: 12.7 % (ref 11.5–15.5)
WBC: 15 K/uL — ABNORMAL HIGH (ref 4.0–10.5)
nRBC: 0 % (ref 0.0–0.2)

## 2024-10-10 LAB — BASIC METABOLIC PANEL WITH GFR
Anion gap: 19 — ABNORMAL HIGH (ref 5–15)
BUN: 22 mg/dL (ref 8–23)
CO2: 13 mmol/L — ABNORMAL LOW (ref 22–32)
Calcium: 8.9 mg/dL (ref 8.9–10.3)
Chloride: 104 mmol/L (ref 98–111)
Creatinine, Ser: 1.34 mg/dL — ABNORMAL HIGH (ref 0.44–1.00)
GFR, Estimated: 41 mL/min — ABNORMAL LOW (ref >60)
Glucose, Bld: 224 mg/dL — ABNORMAL HIGH (ref 70–99)
Potassium: 4.1 mmol/L (ref 3.5–5.1)
Sodium: 136 mmol/L (ref 135–145)

## 2024-10-10 LAB — ECHOCARDIOGRAM LIMITED
Area-P 1/2: 2.76 cm2
Height: 62 in
Weight: 2458.57 [oz_av]

## 2024-10-10 MED ORDER — MIDODRINE HCL 5 MG PO TABS
5.0000 mg | ORAL_TABLET | Freq: Three times a day (TID) | ORAL | Status: DC
  Administered 2024-10-10 (×3): 5 mg via ORAL
  Filled 2024-10-10 (×3): qty 1

## 2024-10-10 MED ORDER — ALPRAZOLAM 0.25 MG PO TABS
0.2500 mg | ORAL_TABLET | Freq: Three times a day (TID) | ORAL | Status: DC | PRN
  Administered 2024-10-10 – 2024-10-12 (×2): 0.25 mg via ORAL
  Filled 2024-10-10 (×2): qty 1

## 2024-10-10 MED ORDER — MIDODRINE HCL 5 MG PO TABS: 5.0000 mg | ORAL_TABLET | Freq: Three times a day (TID) | ORAL | Status: DC

## 2024-10-10 NOTE — Inpatient Diabetes Management (Signed)
 Inpatient Diabetes Program Recommendations  AACE/ADA: New Consensus Statement on Inpatient Glycemic Control (2015)  Target Ranges:  Prepandial:   less than 140 mg/dL      Peak postprandial:   less than 180 mg/dL (1-2 hours)      Critically ill patients:  140 - 180 mg/dL   Lab Results  Component Value Date   GLUCAP 226 (H) 10/10/2024   HGBA1C 9.0 (H) 10/08/2024    Latest Reference Range & Units 10/09/24 07:53 10/09/24 11:13 10/09/24 16:26 10/09/24 21:14 10/10/24 07:29  Glucose-Capillary 70 - 99 mg/dL 786 (H) 779 (H) 701 (H) 223 (H) 226 (H)  (H): Data is abnormally high  Diabetes history: DM2 Outpatient Diabetes medications:  Lantus  20 at bedtime, Metformin  750 QAM  Current orders for Inpatient glycemic control: Lantus  3 units bid Novolog  0-9 units tid correction, 0-5 units hs  Inpatient Diabetes Program Recommendations:   Please consider: -Increase Lantus  to 5 units bid  Thank you, Simara Rhyner E. Jakeline Dave, RN, MSN, CNS, CDCES  Diabetes Coordinator Inpatient Glycemic Control Team Team Pager [phone] (8am-5pm) 10/10/2024 9:43 AM

## 2024-10-10 NOTE — Progress Notes (Signed)
 Occupational Therapy Treatment Patient Details Name: Kylie Ward MRN: [id number] DOB: Mar 20, 1946 Today's Date: 10/10/2024   History of present illness Kylie Ward is a 78 y.o. female with medical history significant for dementia, type 2 diabetes, hyperlipidemia, coronary artery disease (not a candidate for CABG), LBBB, chronic HFpEF, history of CVA, chronic anxiety/depression, CKD 3B, who presented to the ER with facial droop and speech deficit.  Code stroke was activated.  Seen by neurologist/stroke team.  Noncontrast head CT showed no acute intracranial abnormality, remote left frontal infarct and remote lacunar infarct in the left basal ganglia.   OT comments  Pt agreeable to OT session. Daughter at bedside throughout session. Pt completed bed mobility with min assist and use of bed rails. She donned socks in bed with mod assist utilizing figure four strategy. Pt able to complete sit to stand t/f with CGA 2x. She ambulated to sink with CGA and use of RW. Pt able to wash face and brush teeth standing at the sink- she verbalized that she was getting tired and needed to sit down. Once seated in chair, chair alarm turned on. RN and daughter at bedside at end of session. PT would continue to benefit from OT services.       If plan is discharge home, recommend the following:  A little help with walking and/or transfers;A lot of help with bathing/dressing/bathroom;Assistance with cooking/housework;Direct supervision/assist for medications management;Direct supervision/assist for financial management;Assist for transportation;Supervision due to cognitive status;Help with stairs or ramp for entrance   Equipment Recommendations  None recommended by OT       Precautions / Restrictions Precautions Precautions: Fall Recall of Precautions/Restrictions: Impaired Restrictions Weight Bearing Restrictions Per Provider Order: No       Mobility Bed Mobility Overal bed mobility: Needs  Assistance Bed Mobility: Supine to Sit     Supine to sit: Min assist     General bed mobility comments: labored movement    Transfers Overall transfer level: Needs assistance Equipment used: Rolling walker (2 wheels) Transfers: Sit to/from Stand, Bed to chair/wheelchair/BSC Sit to Stand: Contact guard assist     Step pivot transfers: Min assist, Contact guard assist     General transfer comment: slightly labored movement using RW     Balance Overall balance assessment: Needs assistance Sitting-balance support: Feet supported, No upper extremity supported Sitting balance-Leahy Scale: Good Sitting balance - Comments: seated at EOB   Standing balance support: Bilateral upper extremity supported, During functional activity, Reliant on assistive device for balance Standing balance-Leahy Scale: Good Standing balance comment: using RW                           ADL either performed or assessed with clinical judgement   ADL Overall ADL's : Needs assistance/impaired     Grooming: Wash/dry face;Oral care;Set up;Contact guard assist;Standing Grooming Details (indicate cue type and reason): stood at sink side to brsuh teeth and wash face with CGA             Lower Body Dressing: Moderate assistance;Sitting/lateral leans;Set up Lower Body Dressing Details (indicate cue type and reason): donned socks with mod assist while seated on bed using figure four strategy                    Extremity/Trunk Assessment Upper Extremity Assessment Upper Extremity Assessment: Overall WFL for tasks assessed            Vision   Vision Assessment?:  No apparent visual deficits   Perception Perception Perception: Not tested   Praxis Praxis Praxis: Not tested   Communication Communication Communication: No apparent difficulties   Cognition Arousal: Alert Behavior During Therapy: WFL for tasks assessed/performed Cognition: History of cognitive impairments              OT - Cognition Comments: Baseline dementia.                 Following commands: Intact        Cueing   Cueing Techniques: Verbal cues, Gestural cues, Tactile cues, Visual cues  Exercises              Pertinent Vitals/ Pain       Pain Assessment Pain Assessment: No/denies pain         Frequency  Min 2X/week        Progress Toward Goals  OT Goals(current goals can now be found in the care plan section)  Progress towards OT goals: Progressing toward goals  Acute Rehab OT Goals OT Goal Formulation: With family Time For Goal Achievement: 10/23/24 Potential to Achieve Goals: Good ADL Goals Pt Will Perform Grooming: with set-up;sitting Pt Will Perform Upper Body Dressing: with set-up;sitting Pt Will Perform Lower Body Dressing: with min assist;with adaptive equipment;sitting/lateral leans Pt Will Transfer to Toilet: with supervision;ambulating Pt/caregiver will Perform Home Exercise Program: Increased strength;Both right and left upper extremity;Independently  Plan      Co-evaluation          OT goals addressed during session: ADL's and self-care         End of Session Equipment Utilized During Treatment: Rolling walker (2 wheels)  OT Visit Diagnosis: Unsteadiness on feet (R26.81);Other abnormalities of gait and mobility (R26.89);Muscle weakness (generalized) (M62.81);Other symptoms and signs involving the nervous system (R29.898)   Activity Tolerance Patient tolerated treatment well   Patient Left in chair;with call bell/phone within reach;with chair alarm set;with nursing/sitter in room;with family/visitor present   Nurse Communication Mobility status        Time: 8480-8392 OT Time Calculation (min): 48 min  Charges: OT General Charges $OT Visit: 1 Visit OT Treatments $Self Care/Home Management : 38-52 mins  Kylie Ward, OTR/L  10/10/2024, 4:44 PM

## 2024-10-10 NOTE — Progress Notes (Addendum)
 Patient is a transfer from ICU to department 300 to room 331,report received from [facility].Patient is alert to person,confused to place,time and situation.No c/o pain or discomfort noted.Plan of care on going.Family at bedside.

## 2024-10-10 NOTE — Progress Notes (Signed)
 Echocardiogram 2D Echocardiogram has been performed.  Juliene JINNY Rucks 10/10/2024, 3:24 PM

## 2024-10-10 NOTE — Progress Notes (Signed)
 At 1311: RN made Dr Ricky aware that patient's BP in right arm was 108/42 map 59 RN then rechecked on left arm and BP was 54/34. RN informed charge RN Schuyler and rechecked BP in both arms again and BP 129/50 right arm and 56/39 left arm alert with no symptoms reported. MD ok with proceeding with transfer to 331, and taking Bps in right arm. RN made [facility] LPN aware.

## 2024-10-10 NOTE — Progress Notes (Signed)
 PROGRESS NOTE    Kylie Ward  FMW:[id number] DOB: 1946-03-15 DOA: 10/08/2024 PCP: Gladis Mustard, FNP   Chief Complaint  Patient presents with   Code Stroke    Brief Narrative:  As per H&P written by Dr. Shona on 10/08/2024 Kylie Ward is a 78 y.o. female with medical history significant for dementia, type 2 diabetes, hyperlipidemia, coronary artery disease (not a candidate for CABG), LBBB, chronic HFpEF, history of CVA, chronic anxiety/depression, CKD 3B, who presented to the ER with facial droop and speech deficit.  Code stroke was activated.  Seen by neurologist/stroke team.  Noncontrast head CT showed no acute intracranial abnormality, remote left frontal infarct and remote lacunar infarct in the left basal ganglia.   Upon arrival to the ER, the patient was noted to be hypotensive with SBP's in the 70s.  Blood pressure improved after IV fluid bolus.  Temperature 96.6.  Leukocytosis 12.5 K with neutrophilia on lab studies.  UA positive for pyuria.   Neurologist recommended to continue to address hypotension, neurochecks, aspirin  325 mg, MRI of brain without contrast, if positive for acute stroke then CT angio head and neck and complete stroke workup.   Due to concern for UTI, the patient received Rocephin  in the ER.  TRH, hospitalist service, was asked to admit for further management.   High-sensitivity troponin elevated 146, repeat 102.  Old LBBB on twelve-lead EKG.  The patient admits to chest pain which is reproducible on palpation.   ED Course: Temperature 96.6.  BP 103/74, pulse 67, respiration rate 14, O2 saturation 97% on room air.    Assessment & Plan: 1-TIA/stroke-like symptoms - Patient with prior history of CVA - CT head demonstrating no acute intracranial abnormalities - For the most part her neurologic deficits resolved and patient has remained stable since. - Following neurology recommendations MRI has been check demonstrating no acute intracranial  abnormalities or infarcts.  No planning to proceed with any further stroke workup and evaluation. - Continue risk factors modification - Continue to maintain adequate blood pressure. - Symptoms most likely associated with component of vasovagal along with orthostatic hypotension. - Continue the use of midodrine .  2-presumed UTI - Patient denying dysuria at the moment - Urinalysis suggesting the presence of infection with positive pyuria; cultures pending at the moment. - Empirically receiving ceftriaxone  - Continue to maintain adequate hydration.  3-hypotension - Underlying history of heart failure - Significant amount of medications that can affect blood pressure - Patient blood pressure has stabilized without the use of Levophed  - Continue midodrine  3 times a day - Safe to transfer to telemetry bed. - Will continue to hold antihypertensive/diuretic regimen for now.  4-dementia - No behavioral disturbances currently appreciated - Continue constant reorientation - Continue supportive care.  5-type 2 diabetes mellitus - Recent A1c 8.8 - Continue sliding scale insulin  - Follow CBG fluctuation and adjust hypoglycemic regimen as needed..  6-acute kidney injury on chronic kidney disease stage 3B - Maintain adequate hydration - Minimize the use of nephrotoxic agents and contrast - Continue to avoid hypotension as much as possible - Follow renal function trend.  7-chronic history of combined heart failure - Most recent echo with preserved ejection fraction 50 to 55% at the beginning of December. - There is moderate left ventricular hypertrophy - Continue to follow daily weights/strict intake and output - Planning to resume home medications as slowly as blood pressure allow.  8-chronic anxiety/depression - Continue home medication regimen - Mood currently stable.  9-generalized weakness -  Patient from the skilled nursing facility at baseline - Using walker for very short  distance ambulation with assistance at baseline - Once medically stable PT evaluation will be requested - Anticipate discharge back to skilled nursing facility for further care. - Evaluated by physical therapy and is stable.   DVT prophylaxis: Heparin . Code Status: DNR/DNI. Family Communication: No family at bedside on today's evaluation. Disposition:   Status is: Inpatient Remains inpatient appropriate because: Continue IV therapy.   Consultants:  Neurology service consulted by EDP.  Procedures:  See below for x-ray reports.  Antimicrobials:  Ceftriaxone    Subjective: Following commands appropriately, no dizziness, lightheadedness, chest pain, shortness of breath or any other complaints.  Patient also expressed no dysuria and is following commands appropriately.  Mentation back to baseline.  Objective: Vitals:   10/10/24 0910 10/10/24 0915 10/10/24 0925 10/10/24 1005  BP: (!) 132/54 (!) 145/51 (!) 139/52 (!) 126/41  Pulse: 75 88 75 78  Resp: 20 16 17  (!) 22  Temp:      TempSrc:      SpO2: 97% 100% 98% 96%  Weight:      Height:        Intake/Output Summary (Last 24 hours) at 10/10/2024 1050 Last data filed at 10/10/2024 1011 Gross per 24 hour  Intake 889.77 ml  Output 1800 ml  Net -910.23 ml   Filed Weights   10/08/24 1812 10/09/24 0502  Weight: 72.1 kg 69.7 kg    Examination:  General exam: Alert, awake, oriented x x 2; in no acute distress. Respiratory system: Good saturation on room air. Cardiovascular system: Rate controlled, no rubs, no gallops, no JVD. Gastrointestinal system: Abdomen is nondistended, soft and nontender. No organomegaly or masses felt. Normal bowel sounds heard. Central nervous system: Moving 4 limbs spontaneously.  No focal neurological deficits. Extremities: No cyanosis or clubbing. Skin: No petechiae. Psychiatry: Judgement and insight appear impaired secondary to underlying dementia, but appears to be back to her baseline.  Mood  is stable.  Data Reviewed: I have personally reviewed following labs and imaging studies  CBC: Recent Labs  Lab 10/08/24 1840 10/08/24 1850 10/09/24 0441 10/10/24 0650  WBC 12.5*  --  14.3* 15.0*  NEUTROABS 9.9*  --  10.8*  --   HGB 13.1 14.6 11.9* 12.5  HCT 40.9 43.0 36.1 39.4  MCV 91.3  --  90.7 91.6  PLT 238  --  312 297    Basic Metabolic Panel: Recent Labs  Lab 10/08/24 1840 10/08/24 1850 10/10/24 0650  NA 138 138 136  K 5.0 4.1 4.1  CL 98 105 104  CO2 19*  --  13*  GLUCOSE 263* 263* 224*  BUN 45* 49* 22  CREATININE 2.61* 2.50* 1.34*  CALCIUM  8.7*  --  8.9    GFR: Estimated Creatinine Clearance: 32.6 mL/min (A) (by C-G formula based on SCr of 1.34 mg/dL (H)).  Liver Function Tests: Recent Labs  Lab 10/08/24 1840  AST 36  ALT 24  ALKPHOS 159*  BILITOT 0.3  PROT 7.2  ALBUMIN 4.0    CBG: Recent Labs  Lab 10/09/24 0753 10/09/24 1113 10/09/24 1626 10/09/24 2114 10/10/24 0729  GLUCAP 213* 220* 298* 223* 226*     Recent Results (from the past 240 hours)  Blood culture (routine x 2)     Status: None (Preliminary result)   Collection Time: 10/08/24  7:29 PM   Specimen: BLOOD  Result Value Ref Range Status   Specimen Description BLOOD BLOOD LEFT WRIST  Final   Special Requests   Final    BOTTLES DRAWN AEROBIC ONLY Blood Culture results may not be optimal due to an inadequate volume of blood received in culture bottles   Culture   Final    NO GROWTH 2 DAYS Performed at [facility], [address] [place], [place] [address]    Report Status PENDING  Incomplete  Blood culture (routine x 2)     Status: None (Preliminary result)   Collection Time: 10/08/24  7:29 PM   Specimen: BLOOD  Result Value Ref Range Status   Specimen Description BLOOD RIGHT ANTECUBITAL  Final   Special Requests   Final    BOTTLES DRAWN AEROBIC AND ANAEROBIC Blood Culture results may not be optimal due to an inadequate volume of blood received in culture bottles   Culture    Final    NO GROWTH 2 DAYS Performed at [facility], [address] [place], [place] [address]    Report Status PENDING  Incomplete  Urine Culture     Status: Abnormal (Preliminary result)   Collection Time: 10/08/24  8:59 PM   Specimen: Urine, Catheterized  Result Value Ref Range Status   Specimen Description   Final    URINE, CATHETERIZED Performed at [facility], [address] [place], [place] [address]    Special Requests   Final    NONE Performed at [facility], [address] [place], [place] [address]    Culture >=100,000 COLONIES/mL GRAM NEGATIVE RODS (A)  Final   Report Status PENDING  Incomplete  Gastrointestinal Panel by PCR , Stool     Status: None   Collection Time: 10/08/24  9:15 PM   Specimen: Stool  Result Value Ref Range Status   Campylobacter species NOT DETECTED NOT DETECTED Final   Plesimonas shigelloides NOT DETECTED NOT DETECTED Final   Salmonella species NOT DETECTED NOT DETECTED Final   Yersinia enterocolitica NOT DETECTED NOT DETECTED Final   Vibrio species NOT DETECTED NOT DETECTED Final   Vibrio cholerae NOT DETECTED NOT DETECTED Final   Enteroaggregative E coli (EAEC) NOT DETECTED NOT DETECTED Final   Enteropathogenic E coli (EPEC) NOT DETECTED NOT DETECTED Final   Enterotoxigenic E coli (ETEC) NOT DETECTED NOT DETECTED Final   Shiga like toxin producing E coli (STEC) NOT DETECTED NOT DETECTED Final   Shigella/Enteroinvasive E coli (EIEC) NOT DETECTED NOT DETECTED Final   Cryptosporidium NOT DETECTED NOT DETECTED Final   Cyclospora cayetanensis NOT DETECTED NOT DETECTED Final   Entamoeba histolytica NOT DETECTED NOT DETECTED Final   Giardia lamblia NOT DETECTED NOT DETECTED Final   Adenovirus F40/41 NOT DETECTED NOT DETECTED Final   Astrovirus NOT DETECTED NOT DETECTED Final   Norovirus GI/GII NOT DETECTED NOT DETECTED Final   Rotavirus A NOT DETECTED NOT DETECTED Final   Sapovirus (I, II, IV, and V) NOT DETECTED NOT DETECTED Final     Comment: Performed at [facility], [address] [place], [place] [address]  MRSA Next Gen by PCR, Nasal     Status: None   Collection Time: 10/09/24 12:32 AM   Specimen: Nasal Mucosa; Nasal Swab  Result Value Ref Range Status   MRSA by PCR Next Gen NOT DETECTED NOT DETECTED Final    Comment: (NOTE) The GeneXpert MRSA Assay (FDA approved for NASAL specimens only), is one component of a comprehensive MRSA colonization surveillance program. It is not intended to diagnose MRSA infection nor to guide or monitor treatment for MRSA infections. Test performance is  not FDA approved in patients less than 26 years old. Performed at [facility], [address], [place], [place] [address]     Radiology Studies: MR BRAIN WO CONTRAST Result Date: 10/09/2024 EXAM: MRI BRAIN WITHOUT CONTRAST 10/09/2024 11:37:33 AM TECHNIQUE: Multiplanar multisequence MRI of the head/brain was performed without the administration of intravenous contrast. COMPARISON: CT of the head dated 10/08/2024. CLINICAL HISTORY: Stroke suspected (Ped 0-17y). FINDINGS: BRAIN AND VENTRICLES: Age-related atrophy. Moderately advanced periventricular and deep cerebral white matter disease. Chronic encephalomalacia changes are again noted within the left posterior frontal lobe and basal ganglia. There is no restricted diffusion to indicate acute or recent infarction. No intracranial hemorrhage. No mass. No midline shift. No hydrocephalus. The sella is unremarkable. Normal flow voids. ORBITS: The patient is status post bilateral lens replacement. No acute abnormality. SINUSES AND MASTOIDS: No acute abnormality. BONES AND SOFT TISSUES: Normal marrow signal. No acute soft tissue abnormality. IMPRESSION: 1. No acute findings. 2. Chronic encephalomalacia changes within the left posterior frontal lobe and basal ganglia. 3. Age-related atrophy and moderately advanced periventricular and deep cerebral white matter disease. Electronically signed  by: Evalene Coho MD 10/09/2024 11:41 AM EST RP Workstation: [id number]   DG Chest Port 1 View Result Date: 10/08/2024 EXAM: 1 VIEW(S) XRAY OF THE CHEST 10/08/2024 08:01:00 PM COMPARISON: Chest CT without contrast 03/19/2024. CLINICAL HISTORY: weak weak FINDINGS: LINES, TUBES AND DEVICES: Overlying monitor leads. LUNGS AND PLEURA: There is a low inspiration on exam with bronchovascular crowding. No focal pneumonia is evident with limited view of the bases. No vascular congestion is seen. No pleural effusion. No pneumothorax. HEART AND MEDIASTINUM: Mild cardiomegaly. The mitral ring is heavily calcified. There is tortuosity and moderate calcification of the aorta. Stable mediastinum. BONES AND SOFT TISSUES: Degenerative change and mild levoscoliosis of the thoracic spine. IMPRESSION: 1. No acute cardiopulmonary findings. Low lung volumes. 2. Mild cardiomegaly with heavily calcified mitral valve annulus. 3. Aortic tortuosity with moderate atherosclerotic calcification. Electronically signed by: Francis Quam MD 10/08/2024 08:07 PM EST RP Workstation: [id number]   CT HEAD CODE STROKE WO CONTRAST (LKW 0-4.5h, LVO 0-24h) Result Date: 10/08/2024 EXAM: CT HEAD WITHOUT CONTRAST 10/08/2024 06:21:00 PM TECHNIQUE: CT of the head was performed without the administration of intravenous contrast. Automated exposure control, iterative reconstruction, and/or weight based adjustment of the mA/kV was utilized to reduce the radiation dose to as low as reasonably achievable. COMPARISON: None available. CLINICAL HISTORY: Neuro deficit, acute, stroke suspected. FINDINGS: BRAIN AND VENTRICLES: No acute hemorrhage. Remote left frontal infarct. Patchy white matter hypodensities compatible with chronic microvascular ischemic change. Remote lacunar infarct in the left basal ganglia. Partially empty sella. Calcific atherosclerosis. No evidence of acute infarct. No hydrocephalus. No extra-axial collection. No mass effect or midline  shift. ORBITS: Bilateral lens replacement. SINUSES: No acute abnormality. SOFT TISSUES AND SKULL: No acute soft tissue abnormality. No skull fracture. [place] Stroke Program Early CT (ASPECT) Score: Ganglionic (caudate, internal capsule, lentiform nucleus, insula, M1-M3): 7 Supraganglionic (M4-M6): 3 Total: 10 IMPRESSION: 1. No acute intracranial abnormality. ASPECTS 10. 2. Remote left frontal infarct and remote lacunar infarct in the left basal ganglia. 3. Findings messaged to Dr. Germaine via the [facility] messaging system at 6:26 PM and called to Dr. Zammit at [facility] on 10/08/24. Electronically signed by: Donnice Mania MD 10/08/2024 06:29 PM EST RP Workstation: [id number]   Scheduled Meds:  atorvastatin   40 mg Oral Daily   Chlorhexidine  Gluconate Cloth  6 each Topical Daily   clopidogrel   75 mg Oral Daily  escitalopram   20 mg Oral Daily   heparin   5,000 Units Subcutaneous Q8H   insulin  aspart  0-5 Units Subcutaneous QHS   insulin  aspart  0-9 Units Subcutaneous TID WC   insulin  glargine  3 Units Subcutaneous BID   memantine   10 mg Oral BID   midodrine   5 mg Oral TID WC   Continuous Infusions:  cefTRIAXone  (ROCEPHIN )  IV Stopped (10/09/24 1446)     LOS: 2 days    Time spent: 50 minutes    Eric Nunnery, MD [facility]   To contact the attending provider between 7A-7P or the covering provider during after hours 7P-7A, please log into the web site [email or web address] and access using universal [place] password for that web site. If you do not have the password, please call the hospital operator.  10/10/2024, 10:50 AM

## 2024-10-10 NOTE — Plan of Care (Signed)
   Problem: Education: Goal: Knowledge of General Education information will improve Description Including pain rating scale, medication(s)/side effects and non-pharmacologic comfort measures Outcome: Progressing   Problem: Education: Goal: Knowledge of General Education information will improve Description Including pain rating scale, medication(s)/side effects and non-pharmacologic comfort measures Outcome: Progressing

## 2024-10-11 ENCOUNTER — Inpatient Hospital Stay (HOSPITAL_COMMUNITY)

## 2024-10-11 LAB — BASIC METABOLIC PANEL WITH GFR
Anion gap: 25 — ABNORMAL HIGH (ref 5–15)
BUN: 21 mg/dL (ref 8–23)
CO2: 9 mmol/L — ABNORMAL LOW (ref 22–32)
Calcium: 9.1 mg/dL (ref 8.9–10.3)
Chloride: 101 mmol/L (ref 98–111)
Creatinine, Ser: 1.54 mg/dL — ABNORMAL HIGH (ref 0.44–1.00)
GFR, Estimated: 35 mL/min — ABNORMAL LOW (ref >60)
Glucose, Bld: 350 mg/dL — ABNORMAL HIGH (ref 70–99)
Potassium: 4.7 mmol/L (ref 3.5–5.1)
Sodium: 134 mmol/L — ABNORMAL LOW (ref 135–145)

## 2024-10-11 LAB — URINE CULTURE: Culture: >=100000 — AB

## 2024-10-11 LAB — GLUCOSE, CAPILLARY
Glucose-Capillary: 357 mg/dL — ABNORMAL HIGH (ref 70–99)
Glucose-Capillary: 301 mg/dL — ABNORMAL HIGH (ref 70–99)
Glucose-Capillary: 243 mg/dL — ABNORMAL HIGH (ref 70–99)
Glucose-Capillary: 216 mg/dL — ABNORMAL HIGH (ref 70–99)

## 2024-10-11 LAB — RESP PANEL BY RT-PCR (RSV, FLU A&B, COVID)  RVPGX2
Influenza A by PCR: NEGATIVE
Influenza B by PCR: NEGATIVE
Resp Syncytial Virus by PCR: NEGATIVE
SARS Coronavirus 2 by RT PCR: NEGATIVE

## 2024-10-11 MED ORDER — IPRATROPIUM-ALBUTEROL 0.5-2.5 (3) MG/3ML IN SOLN
3.0000 mL | RESPIRATORY_TRACT | Status: DC | PRN
  Administered 2024-10-11: 3 mL via RESPIRATORY_TRACT
  Filled 2024-10-11: qty 3

## 2024-10-11 MED ORDER — METOPROLOL SUCCINATE ER 25 MG PO TB24
25.0000 mg | ORAL_TABLET | Freq: Every day | ORAL | Status: DC
  Administered 2024-10-11 – 2024-10-12 (×2): 25 mg via ORAL
  Filled 2024-10-11 (×2): qty 1

## 2024-10-11 MED ORDER — FUROSEMIDE 10 MG/ML IJ SOLN
20.0000 mg | Freq: Two times a day (BID) | INTRAMUSCULAR | Status: DC
  Administered 2024-10-11 – 2024-10-12 (×2): 20 mg via INTRAVENOUS
  Filled 2024-10-11 (×2): qty 2

## 2024-10-11 MED ORDER — MIDODRINE HCL 5 MG PO TABS
7.5000 mg | ORAL_TABLET | Freq: Three times a day (TID) | ORAL | Status: DC
  Administered 2024-10-11 – 2024-10-12 (×5): 7.5 mg via ORAL
  Filled 2024-10-11 (×5): qty 2

## 2024-10-11 NOTE — Progress Notes (Signed)
 Wound on sacrum is stage 1 , not open, and blanchable. Cleanses and redressed. Turned to left.

## 2024-10-11 NOTE — Plan of Care (Signed)
°  Problem: Education: Goal: Knowledge of General Education information will improve Description: Including pain rating scale, medication(s)/side effects and non-pharmacologic comfort measures Outcome: Progressing   Problem: Health Behavior/Discharge Planning: Goal: Ability to manage health-related needs will improve Outcome: Progressing   Problem: Clinical Measurements: Goal: Ability to maintain clinical measurements within normal limits will improve Outcome: Progressing Goal: Will remain free from infection Outcome: Progressing Goal: Diagnostic test results will improve Outcome: Progressing Goal: Cardiovascular complication will be avoided Outcome: Progressing   Problem: Activity: Goal: Risk for activity intolerance will decrease Outcome: Progressing   Problem: Nutrition: Goal: Adequate nutrition will be maintained Outcome: Progressing   Problem: Coping: Goal: Level of anxiety will decrease Outcome: Progressing   Problem: Elimination: Goal: Will not experience complications related to bowel motility Outcome: Progressing Goal: Will not experience complications related to urinary retention Outcome: Progressing   Problem: Pain Managment: Goal: General experience of comfort will improve and/or be controlled Outcome: Progressing   Problem: Safety: Goal: Ability to remain free from injury will improve Outcome: Progressing   Problem: Skin Integrity: Goal: Risk for impaired skin integrity will decrease Outcome: Progressing   Problem: Education: Goal: Ability to describe self-care measures that may prevent or decrease complications (Diabetes Survival Skills Education) will improve Outcome: Progressing Goal: Individualized Educational Video(s) Outcome: Progressing   Problem: Coping: Goal: Ability to adjust to condition or change in health will improve Outcome: Progressing   Problem: Fluid Volume: Goal: Ability to maintain a balanced intake and output will  improve Outcome: Progressing   Problem: Health Behavior/Discharge Planning: Goal: Ability to identify and utilize available resources and services will improve Outcome: Progressing Goal: Ability to manage health-related needs will improve Outcome: Progressing   Problem: Metabolic: Goal: Ability to maintain appropriate glucose levels will improve Outcome: Progressing   Problem: Nutritional: Goal: Maintenance of adequate nutrition will improve Outcome: Progressing Goal: Progress toward achieving an optimal weight will improve Outcome: Progressing   Problem: Skin Integrity: Goal: Risk for impaired skin integrity will decrease Outcome: Progressing   Problem: Tissue Perfusion: Goal: Adequacy of tissue perfusion will improve Outcome: Progressing   Problem: Clinical Measurements: Goal: Respiratory complications will improve Outcome: Not Progressing

## 2024-10-11 NOTE — Progress Notes (Signed)
 Pt attempted to eat after taking midodrine  and pt threw up her few bits of food.  Pt Stated , I can't eat it.  Will try again later.  Notified MD. Will continue to monitor.

## 2024-10-11 NOTE — Progress Notes (Signed)
 PROGRESS NOTE    Kylie Ward  FMW:[id number] DOB: August 17, 1946 DOA: 10/08/2024 PCP: Gladis Mustard, FNP   Chief Complaint  Patient presents with   Code Stroke    Brief Narrative:  As per H&P written by Dr. Shona on 10/08/2024 Kylie Ward is a 78 y.o. female with medical history significant for dementia, type 2 diabetes, hyperlipidemia, coronary artery disease (not a candidate for CABG), LBBB, chronic HFpEF, history of CVA, chronic anxiety/depression, CKD 3B, who presented to the ER with facial droop and speech deficit.  Code stroke was activated.  Seen by neurologist/stroke team.  Noncontrast head CT showed no acute intracranial abnormality, remote left frontal infarct and remote lacunar infarct in the left basal ganglia.   Upon arrival to the ER, the patient was noted to be hypotensive with SBP's in the 70s.  Blood pressure improved after IV fluid bolus.  Temperature 96.6.  Leukocytosis 12.5 K with neutrophilia on lab studies.  UA positive for pyuria.   Neurologist recommended to continue to address hypotension, neurochecks, aspirin  325 mg, MRI of brain without contrast, if positive for acute stroke then CT angio head and neck and complete stroke workup.   Due to concern for UTI, the patient received Rocephin  in the ER.  TRH, hospitalist service, was asked to admit for further management.   High-sensitivity troponin elevated 146, repeat 102.  Old LBBB on twelve-lead EKG.  The patient admits to chest pain which is reproducible on palpation.   ED Course: Temperature 96.6.  BP 103/74, pulse 67, respiration rate 14, O2 saturation 97% on room air.    Assessment & Plan: 1-TIA/stroke-like symptoms - Patient with prior history of CVA - CT head demonstrating no acute intracranial abnormalities - For the most part her neurologic deficits resolved and patient has remained stable since. - Following neurology recommendations MRI has been check demonstrating no acute intracranial  abnormalities or infarcts.  No planning to proceed with any further stroke workup and evaluation. - Continue risk factors modification - Continue to maintain adequate blood pressure. - Symptoms most likely associated with component of vasovagal syncope along with orthostatic hypotension. - Continue the use of midodrine .  2-presumed UTI - Patient denying dysuria at the moment - Urinalysis suggesting the presence of infection with positive pyuria; cultures pending at the moment. - Empirically receiving ceftriaxone  - Continue to maintain adequate hydration.  3-hypotension/hypovolemic shock most likely. - Underlying history of heart failure - Significant amount of medications that can affect blood pressure - Patient blood pressure has stabilized without the use of Levophed  - will Continue midodrine  3 times a day - Continue to follow response now that she is in need of diuretics and will also start resuming home medications (starting with metoprolol  as mentioned below).  4-dementia - No behavioral disturbances currently appreciated - Continue constant reorientation - Continue supportive care.  5-type 2 diabetes mellitus - Recent A1c 8.8 - Continue sliding scale insulin  - Follow CBG fluctuation and adjust hypoglycemic regimen as needed..  6-acute kidney injury on chronic kidney disease stage 3B - Maintain adequate hydration - Continue to minimize the use of nephrotoxic agents and contrast - Continue to avoid hypotension as much as possible. - Follow renal function trend.  7-mild acute chronic history of combined heart failure - Most recent echo with preserved ejection fraction 50 to 55% at the beginning of December. -Patient's symptoms presented after holding home medications and receiving some fluids as part of treatment for hypovolemic shock at time of admission. - Continue to  follow daily weights/strict intake and output -Chest x-ray demonstrating interstitial edema. - Patient  has been started on IV Lasix  - Follow clinical response. - Resuming the use of metoprolol .  8-chronic anxiety/depression - Continue home medication regimen - No suicidal ideation or hallucination.  9-generalized weakness - Patient from the skilled nursing facility at baseline - Using walker for very short distance ambulation with assistance at baseline - Once medically stable PT evaluation will be requested - Anticipate discharge back to skilled nursing facility for further care. - Evaluated by physical therapy and is stable.  10-Klebsiella pneumoniae UTI - over 100,000 colonies of Klebsiella pneumoniae has been appreciated on patient's urinalysis/culture - Sensitivity pending - Continue treatment with rocephin  -no dysuria.   DVT prophylaxis: Heparin . Code Status: DNR/DNI. Family Communication: No family at bedside on today's evaluation. Disposition:   Status is: Inpatient Remains inpatient appropriate because: Continue IV therapy.   Consultants:  Neurology service consulted by EDP.  Procedures:  See below for x-ray reports.  Antimicrobials:  Ceftriaxone    Subjective: No chest pain, no nausea, no vomiting.  Short winded and with some complaints of orthopnea.  2 L nasal cannula supplementation in place.  Patient denies dysuria.  Afebrile.  Objective: Vitals:   10/11/24 0319 10/11/24 0444 10/11/24 0500 10/11/24 1255  BP: 96/65 113/76  106/69  Pulse: (!) 114 (!) 115  94  Resp: 20 18  18   Temp: 97.7 F (36.5 C) 97.8 F (36.6 C)  98.2 F (36.8 C)  TempSrc: Oral Oral  Oral  SpO2: 92% 92%  96%  Weight:   69.7 kg   Height:        Intake/Output Summary (Last 24 hours) at 10/11/2024 1332 Last data filed at 10/11/2024 1200 Gross per 24 hour  Intake 180 ml  Output 350 ml  Net -170 ml   Filed Weights   10/08/24 1812 10/09/24 0502 10/11/24 0500  Weight: 72.1 kg 69.7 kg 69.7 kg    Examination: General exam: Alert, awake, able to follow commands; requiring 2 L  nasal cannula supplementation.  Short winded sensation and mild orthopnea reported. Respiratory system:   No using accessory muscles;  No using accessory muscle. Cardiovascular system: sinus tachycardia, no rubs, no gallops, no JVD. Gastrointestinal system: Abdomen is nondistended, soft and nontender. No organomegaly or masses felt. Normal bowel sounds heard. Central nervous system: No focal neurological deficits. Extremities: No  Cyanosis or clubbing. Skin: No   Petechiae. Psychiatry: Judgement and insight appear   Stable, patient with underlying history of dementia. Flat affect   Data Reviewed: I have personally reviewed following labs and imaging studies  CBC: Recent Labs  Lab 10/08/24 1840 10/08/24 1850 10/09/24 0441 10/10/24 0650  WBC 12.5*  --  14.3* 15.0*  NEUTROABS 9.9*  --  10.8*  --   HGB 13.1 14.6 11.9* 12.5  HCT 40.9 43.0 36.1 39.4  MCV 91.3  --  90.7 91.6  PLT 238  --  312 297    Basic Metabolic Panel: Recent Labs  Lab 10/08/24 1840 10/08/24 1850 10/10/24 0650 10/11/24 0514  NA 138 138 136 134*  K 5.0 4.1 4.1 4.7  CL 98 105 104 101  CO2 19*  --  13* 9*  GLUCOSE 263* 263* 224* 350*  BUN 45* 49* 22 21  CREATININE 2.61* 2.50* 1.34* 1.54*  CALCIUM  8.7*  --  8.9 9.1    GFR: Estimated Creatinine Clearance: 28.4 mL/min (A) (by C-G formula based on SCr of 1.54 mg/dL (H)).  Liver Function  Tests: Recent Labs  Lab 10/08/24 1840  AST 36  ALT 24  ALKPHOS 159*  BILITOT 0.3  PROT 7.2  ALBUMIN 4.0    CBG: Recent Labs  Lab 10/10/24 1126 10/10/24 1605 10/10/24 1949 10/11/24 0743 10/11/24 1115  GLUCAP 153* 131* 196* 357* 301*     Recent Results (from the past 240 hours)  Blood culture (routine x 2)     Status: None (Preliminary result)   Collection Time: 10/08/24  7:29 PM   Specimen: BLOOD  Result Value Ref Range Status   Specimen Description BLOOD BLOOD LEFT WRIST  Final   Special Requests   Final    BOTTLES DRAWN AEROBIC ONLY Blood Culture  results may not be optimal due to an inadequate volume of blood received in culture bottles   Culture   Final    NO GROWTH 3 DAYS Performed at [facility], [address] [place], [place] [address]    Report Status PENDING  Incomplete  Blood culture (routine x 2)     Status: None (Preliminary result)   Collection Time: 10/08/24  7:29 PM   Specimen: BLOOD  Result Value Ref Range Status   Specimen Description BLOOD RIGHT ANTECUBITAL  Final   Special Requests   Final    BOTTLES DRAWN AEROBIC AND ANAEROBIC Blood Culture results may not be optimal due to an inadequate volume of blood received in culture bottles   Culture   Final    NO GROWTH 3 DAYS Performed at [facility], [address] [place], [place] [address]    Report Status PENDING  Incomplete  Urine Culture     Status: Abnormal   Collection Time: 10/08/24  8:59 PM   Specimen: Urine, Catheterized  Result Value Ref Range Status   Specimen Description   Final    URINE, CATHETERIZED Performed at [facility], [address], [place], [place] [address]    Special Requests   Final    NONE Performed at [facility], [address], [place], [place] [address]    Culture >=100,000 COLONIES/mL KLEBSIELLA PNEUMONIAE (A)  Final   Report Status 10/11/2024 FINAL  Final   Organism ID, Bacteria KLEBSIELLA PNEUMONIAE (A)  Final      Susceptibility   Klebsiella pneumoniae - MIC*    AMPICILLIN RESISTANT Resistant     CEFAZOLIN  (URINE) Value in next row Sensitive      2 SENSITIVEThis is a modified FDA-approved test that has been validated and its performance characteristics determined by the reporting laboratory.  This laboratory is certified under the Clinical Laboratory Improvement Amendments CLIA as qualified to perform high complexity clinical laboratory testing.    CEFEPIME Value in next row Sensitive      2 SENSITIVEThis is a modified FDA-approved test that has been validated and its performance characteristics determined by the  reporting laboratory.  This laboratory is certified under the Clinical Laboratory Improvement Amendments CLIA as qualified to perform high complexity clinical laboratory testing.    ERTAPENEM Value in next row Sensitive      2 SENSITIVEThis is a modified FDA-approved test that has been validated and its performance characteristics determined by the reporting laboratory.  This laboratory is certified under the Clinical Laboratory Improvement Amendments CLIA as qualified to perform high complexity clinical laboratory testing.    CEFTRIAXONE  Value in next row Sensitive      2 SENSITIVEThis is a modified FDA-approved test that has been validated and its performance characteristics determined by the reporting laboratory.  This laboratory  is certified under the Clinical Laboratory Improvement Amendments CLIA as qualified to perform high complexity clinical laboratory testing.    CIPROFLOXACIN  Value in next row Sensitive      2 SENSITIVEThis is a modified FDA-approved test that has been validated and its performance characteristics determined by the reporting laboratory.  This laboratory is certified under the Clinical Laboratory Improvement Amendments CLIA as qualified to perform high complexity clinical laboratory testing.    GENTAMICIN Value in next row Sensitive      2 SENSITIVEThis is a modified FDA-approved test that has been validated and its performance characteristics determined by the reporting laboratory.  This laboratory is certified under the Clinical Laboratory Improvement Amendments CLIA as qualified to perform high complexity clinical laboratory testing.    NITROFURANTOIN Value in next row Sensitive      2 SENSITIVEThis is a modified FDA-approved test that has been validated and its performance characteristics determined by the reporting laboratory.  This laboratory is certified under the Clinical Laboratory Improvement Amendments CLIA as qualified to perform high complexity clinical laboratory  testing.    TRIMETH /SULFA  Value in next row Sensitive      2 SENSITIVEThis is a modified FDA-approved test that has been validated and its performance characteristics determined by the reporting laboratory.  This laboratory is certified under the Clinical Laboratory Improvement Amendments CLIA as qualified to perform high complexity clinical laboratory testing.    AMPICILLIN/SULBACTAM Value in next row Sensitive      2 SENSITIVEThis is a modified FDA-approved test that has been validated and its performance characteristics determined by the reporting laboratory.  This laboratory is certified under the Clinical Laboratory Improvement Amendments CLIA as qualified to perform high complexity clinical laboratory testing.    PIP/TAZO Value in next row Sensitive      <=4 SENSITIVEThis is a modified FDA-approved test that has been validated and its performance characteristics determined by the reporting laboratory.  This laboratory is certified under the Clinical Laboratory Improvement Amendments CLIA as qualified to perform high complexity clinical laboratory testing.    MEROPENEM Value in next row Sensitive      <=4 SENSITIVEThis is a modified FDA-approved test that has been validated and its performance characteristics determined by the reporting laboratory.  This laboratory is certified under the Clinical Laboratory Improvement Amendments CLIA as qualified to perform high complexity clinical laboratory testing.    * >=100,000 COLONIES/mL KLEBSIELLA PNEUMONIAE  Gastrointestinal Panel by PCR , Stool     Status: None   Collection Time: 10/08/24  9:15 PM   Specimen: Stool  Result Value Ref Range Status   Campylobacter species NOT DETECTED NOT DETECTED Final   Plesimonas shigelloides NOT DETECTED NOT DETECTED Final   Salmonella species NOT DETECTED NOT DETECTED Final   Yersinia enterocolitica NOT DETECTED NOT DETECTED Final   Vibrio species NOT DETECTED NOT DETECTED Final   Vibrio cholerae NOT DETECTED NOT  DETECTED Final   Enteroaggregative E coli (EAEC) NOT DETECTED NOT DETECTED Final   Enteropathogenic E coli (EPEC) NOT DETECTED NOT DETECTED Final   Enterotoxigenic E coli (ETEC) NOT DETECTED NOT DETECTED Final   Shiga like toxin producing E coli (STEC) NOT DETECTED NOT DETECTED Final   Shigella/Enteroinvasive E coli (EIEC) NOT DETECTED NOT DETECTED Final   Cryptosporidium NOT DETECTED NOT DETECTED Final   Cyclospora cayetanensis NOT DETECTED NOT DETECTED Final   Entamoeba histolytica NOT DETECTED NOT DETECTED Final   Giardia lamblia NOT DETECTED NOT DETECTED Final   Adenovirus F40/41 NOT DETECTED NOT DETECTED Final  Astrovirus NOT DETECTED NOT DETECTED Final   Norovirus GI/GII NOT DETECTED NOT DETECTED Final   Rotavirus A NOT DETECTED NOT DETECTED Final   Sapovirus (I, II, IV, and V) NOT DETECTED NOT DETECTED Final    Comment: Performed at [facility], [address] [place], [place] [address]  MRSA Next Gen by PCR, Nasal     Status: None   Collection Time: 10/09/24 12:32 AM   Specimen: Nasal Mucosa; Nasal Swab  Result Value Ref Range Status   MRSA by PCR Next Gen NOT DETECTED NOT DETECTED Final    Comment: (NOTE) The GeneXpert MRSA Assay (FDA approved for NASAL specimens only), is one component of a comprehensive MRSA colonization surveillance program. It is not intended to diagnose MRSA infection nor to guide or monitor treatment for MRSA infections. Test performance is not FDA approved in patients less than 64 years old. Performed at [facility], [address], [place], [place] [address]   Resp panel by RT-PCR (RSV, Flu A&B, Covid) Anterior Nasal Swab     Status: None   Collection Time: 10/11/24  5:44 AM   Specimen: Anterior Nasal Swab  Result Value Ref Range Status   SARS Coronavirus 2 by RT PCR NEGATIVE NEGATIVE Final    Comment: (NOTE) SARS-CoV-2 target nucleic acids are NOT DETECTED.  The SARS-CoV-2 RNA is generally detectable in upper respiratory specimens  during the acute phase of infection. The lowest concentration of SARS-CoV-2 viral copies this assay can detect is 138 copies/mL. A negative result does not preclude SARS-Cov-2 infection and should not be used as the sole basis for treatment or other patient management decisions. A negative result may occur with  improper specimen collection/handling, submission of specimen other than nasopharyngeal swab, presence of viral mutation(s) within the areas targeted by this assay, and inadequate number of viral copies(<138 copies/mL). A negative result must be combined with clinical observations, patient history, and epidemiological information. The expected result is Negative.  Fact Sheet for Patients:  bloggercourse.com  Fact Sheet for Healthcare Providers:  seriousbroker.it  This test is no t yet approved or cleared by the [place]  FDA and  has been authorized for detection and/or diagnosis of SARS-CoV-2 by FDA under an Emergency Use Authorization (EUA). This EUA will remain  in effect (meaning this test can be used) for the duration of the COVID-19 declaration under Section 564(b)(1) of the Act, 21 U.S.C.section 360bbb-3(b)(1), unless the authorization is terminated  or revoked sooner.       Influenza A by PCR NEGATIVE NEGATIVE Final   Influenza B by PCR NEGATIVE NEGATIVE Final    Comment: (NOTE) The Xpert Xpress SARS-CoV-2/FLU/RSV plus assay is intended as an aid in the diagnosis of influenza from Nasopharyngeal swab specimens and should not be used as a sole basis for treatment. Nasal washings and aspirates are unacceptable for Xpert Xpress SARS-CoV-2/FLU/RSV testing.  Fact Sheet for Patients: bloggercourse.com  Fact Sheet for Healthcare Providers: seriousbroker.it  This test is not yet approved or cleared by the [place]  FDA and has been authorized for detection  and/or diagnosis of SARS-CoV-2 by FDA under an Emergency Use Authorization (EUA). This EUA will remain in effect (meaning this test can be used) for the duration of the COVID-19 declaration under Section 564(b)(1) of the Act, 21 U.S.C. section 360bbb-3(b)(1), unless the authorization is terminated or revoked.     Resp Syncytial Virus by PCR NEGATIVE NEGATIVE Final    Comment: (NOTE) Fact Sheet for Patients: bloggercourse.com  Fact Sheet for  Healthcare Providers: seriousbroker.it  This test is not yet approved or cleared by the [place]  FDA and has been authorized for detection and/or diagnosis of SARS-CoV-2 by FDA under an Emergency Use Authorization (EUA). This EUA will remain in effect (meaning this test can be used) for the duration of the COVID-19 declaration under Section 564(b)(1) of the Act, 21 U.S.C. section 360bbb-3(b)(1), unless the authorization is terminated or revoked.  Performed at [facility], [address], [place], [place] [address]     Radiology Studies: DG Chest 1 View Result Date: 10/11/2024 CLINICAL DATA:  Shortness of breath. EXAM: CHEST  1 VIEW COMPARISON:  10/08/2024 FINDINGS: The cardio pericardial silhouette is enlarged. Diffuse interstitial opacity is compatible with edema. Minimal basilar airspace disease likely edematous although superimposed pneumonia not excluded. No substantial pleural effusion. No acute bony abnormality. Telemetry leads overlie the chest. IMPRESSION: Enlargement of the cardiopericardial silhouette with diffuse interstitial opacity compatible with edema. Electronically Signed   By: Camellia Candle M.D.   On: 10/11/2024 05:43   ECHOCARDIOGRAM LIMITED Result Date: 10/10/2024    ECHOCARDIOGRAM LIMITED REPORT   Patient Name:   Kylie Ward Date of Exam: 10/10/2024 Medical Rec #:  [id number]         Height:       62.0 in Accession #:    [id number]        Weight:       153.7 lb Date  of Birth:  Feb 21, 1946         BSA:          1.709 m Patient Age:    78 years          BP:           119/47 mmHg Patient Gender: F                 HR:           66 bpm. Exam Location:  [facility] Procedure: Limited Echo, Cardiac Doppler and Color Doppler (Both Spectral and            Color Flow Doppler were utilized during procedure). Indications:    Elevated Troponin  History:        Patient has prior history of Echocardiogram examinations, most                 recent 10/05/2024. CHF, CAD, Stroke, Signs/Symptoms:Edema; Risk                 Factors:Dyslipidemia, Diabetes, Hypertension and Former Smoker.  Sonographer:    Juliene Rucks Referring Phys: [id number] TERRY LOISE HURST  Sonographer Comments: Patient is obese and suboptimal parasternal window. IMPRESSIONS  1. Limited visualization. Roughly LVEF is 50%. . Left ventricular endocardial border not optimally defined to evaluate regional wall motion. Left ventricular diastolic parameters are consistent with Grade I diastolic dysfunction (impaired relaxation).  2. Right ventricular systolic function was not well visualized. The right ventricular size is not well visualized.  3. Limited echocardiogram  4. FINDINGS  Left Ventricle: Limited visualization. Roughly LVEF is 50%. Left ventricular endocardial border not optimally defined to evaluate regional wall motion. Left ventricular diastolic parameters are consistent with Grade I diastolic dysfunction (impaired relaxation). Right Ventricle: The right ventricular size is not well visualized. Right vetricular wall thickness was not well visualized. Right ventricular systolic function was not well visualized.  Diastology LV e' medial:    3.24 cm/s LV E/e' medial:  24.1 LV e' lateral:   3.86 cm/s LV  E/e' lateral: 20.2  AORTIC VALVE LVOT Vmax:   79.60 cm/s LVOT Vmean:  51.800 cm/s LVOT VTI:    0.172 m MITRAL VALVE MV Area (PHT): 2.76 cm     SHUNTS MV Decel Time: 275 msec     Systemic VTI: 0.17 m MV E velocity: 78.10 cm/s MV A  velocity: 160.00 cm/s MV E/A ratio:  0.49 Dorn Ross MD Electronically signed by Dorn Ross MD Signature Date/Time: 10/10/2024/6:42:43 PM    Final    Scheduled Meds:  atorvastatin   40 mg Oral Daily   clopidogrel   75 mg Oral Daily   escitalopram   20 mg Oral Daily   furosemide   20 mg Intravenous BID   heparin   5,000 Units Subcutaneous Q8H   insulin  aspart  0-5 Units Subcutaneous QHS   insulin  aspart  0-9 Units Subcutaneous TID WC   insulin  glargine  3 Units Subcutaneous BID   memantine   10 mg Oral BID   metoprolol  succinate  25 mg Oral Daily   midodrine   7.5 mg Oral TID WC   Continuous Infusions:  cefTRIAXone  (ROCEPHIN )  IV 2 g (10/10/24 1609)     LOS: 3 days    Time spent: 50 minutes    Eric Nunnery, MD [facility]   To contact the attending provider between 7A-7P or the covering provider during after hours 7P-7A, please log into the web site [email or web address] and access using universal [place] password for that web site. If you do not have the password, please call the hospital operator.  10/11/2024, 1:32 PM

## 2024-10-12 LAB — BASIC METABOLIC PANEL WITH GFR
Anion gap: 13 (ref 5–15)
BUN: 30 mg/dL — ABNORMAL HIGH (ref 8–23)
CO2: 19 mmol/L — ABNORMAL LOW (ref 22–32)
Calcium: 8.8 mg/dL — ABNORMAL LOW (ref 8.9–10.3)
Chloride: 106 mmol/L (ref 98–111)
Creatinine, Ser: 1.7 mg/dL — ABNORMAL HIGH (ref 0.44–1.00)
GFR, Estimated: 31 mL/min — ABNORMAL LOW (ref >60)
Glucose, Bld: 206 mg/dL — ABNORMAL HIGH (ref 70–99)
Potassium: 4.1 mmol/L (ref 3.5–5.1)
Sodium: 138 mmol/L (ref 135–145)

## 2024-10-12 LAB — GLUCOSE, CAPILLARY
Glucose-Capillary: 201 mg/dL — ABNORMAL HIGH (ref 70–99)
Glucose-Capillary: 256 mg/dL — ABNORMAL HIGH (ref 70–99)

## 2024-10-12 MED ORDER — SPIRONOLACTONE 25 MG PO TABS: 12.5000 mg | ORAL_TABLET | Freq: Every day | ORAL | 1 refills | Status: AC

## 2024-10-12 MED ORDER — ALPRAZOLAM 1 MG PO TABS: 1.0000 mg | ORAL_TABLET | Freq: Two times a day (BID) | ORAL | 5 refills | Status: AC | PRN

## 2024-10-12 MED ORDER — TORSEMIDE 20 MG PO TABS: 20.0000 mg | ORAL_TABLET | Freq: Every day | ORAL | 1 refills | Status: AC

## 2024-10-12 MED ORDER — FUROSEMIDE 10 MG/ML IJ SOLN
20.0000 mg | Freq: Once | INTRAMUSCULAR | Status: AC
  Administered 2024-10-12: 20 mg via INTRAVENOUS
  Filled 2024-10-12: qty 2

## 2024-10-12 MED ORDER — MIDODRINE HCL 10 MG PO TABS: 10.0000 mg | ORAL_TABLET | Freq: Three times a day (TID) | ORAL | 1 refills | Status: AC

## 2024-10-12 MED ORDER — CEPHALEXIN 500 MG PO CAPS: 500.0000 mg | ORAL_CAPSULE | Freq: Three times a day (TID) | ORAL | 0 refills | Status: AC

## 2024-10-12 MED ORDER — INSULIN ASPART FLEXPEN 100 UNIT/ML ~~LOC~~ SOPN: 0.0000 [IU] | PEN_INJECTOR | Freq: Three times a day (TID) | SUBCUTANEOUS | 1 refills | Status: AC

## 2024-10-12 MED ORDER — LANTUS SOLOSTAR 100 UNIT/ML ~~LOC~~ SOPN: 10.0000 [IU] | PEN_INJECTOR | Freq: Every day | SUBCUTANEOUS | 11 refills | Status: AC

## 2024-10-12 MED ORDER — METOPROLOL SUCCINATE ER 25 MG PO TB24: 12.5000 mg | ORAL_TABLET | Freq: Every day | ORAL | 3 refills | Status: AC

## 2024-10-12 MED ORDER — SODIUM CHLORIDE 0.9 % IV SOLN: 1.0000 g | Freq: Once | INTRAVENOUS | Status: DC

## 2024-10-12 MED ORDER — SODIUM CHLORIDE 0.9 % IV SOLN
1.0000 g | Freq: Once | INTRAVENOUS | Status: AC
  Administered 2024-10-12: 1 g via INTRAVENOUS
  Filled 2024-10-12: qty 10

## 2024-10-12 MED ORDER — CEFTRIAXONE SODIUM 1 G IJ SOLR
1.0000 g | Freq: Once | INTRAMUSCULAR | Status: DC
  Filled 2024-10-12: qty 10

## 2024-10-12 NOTE — Care Management Important Message (Signed)
 Important Message  Patient Details  Name: Kylie Ward MRN: [id number] Date of Birth: 10-14-1946   Important Message Given:  Yes - Medicare IM     [name] 10/12/2024, 12:04 PM

## 2024-10-12 NOTE — TOC Transition Note (Signed)
 Transition of Care [facility]) - Discharge Note   Patient Details  Name: Kylie Ward MRN: [id number] Date of Birth: 02-19-1946  Transition of Care [facility] CM/SW Contact:  Lucie Lunger, LCSWA Phone Number: 10/12/2024, 1:21 PM   Clinical Narrative:    CSW updated that insurance shara has been approved for return to [facility]. CSW spoke to [facility] in admissions who confirms they can accept pt back today. Pts daughter updated and will provide transport. Room and report sent to RN. D/C clinicals sent to facility via HUB. TOC signing off.   Final next level of care: Skilled Nursing Facility Barriers to Discharge: Barriers Resolved   Patient Goals and CMS Choice Patient states their goals for this hospitalization and ongoing recovery are:: return to [email or web address] Compare Post Acute Care list provided to:: Patient Represenative (must comment) Choice offered to / list presented to : Adult Children      Discharge Placement                Patient to be transferred to facility by: daughter Name of family member notified: daughter Patient and family notified of of transfer: 10/12/24  Discharge Plan and Services Additional resources added to the After Visit Summary for   In-house Referral: Clinical Social Work Discharge Planning Services: CM Consult Post Acute Care Choice: Nursing Home                               Social Drivers of Health (SDOH) Interventions SDOH Screenings   Food Insecurity: No Food Insecurity (10/08/2024)  Housing: Low Risk (10/08/2024)  Transportation Needs: No Transportation Needs (10/08/2024)  Utilities: Not At Risk (10/08/2024)  Depression (PHQ2-9): Medium Risk (07/05/2024)  Financial Resource Strain: Low Risk (05/10/2022)  Physical Activity: Inactive (05/10/2022)  Social Connections: Socially Isolated (10/08/2024)  Stress: No Stress Concern Present (05/10/2022)  Tobacco Use: Medium Risk (10/08/2024)     Readmission Risk Interventions     10/12/2024    1:21 PM 10/11/2024   10:17 AM 10/09/2024   10:55 AM  Readmission Risk Prevention Plan  Transportation Screening Complete Complete Complete  Home Care Screening Complete Complete Complete  Medication Review (RN CM) Complete Complete Complete

## 2024-10-12 NOTE — Discharge Instructions (Signed)
 1)Avoid ibuprofen/Advil/Aleve/Motrin/Goody Powders/Naproxen/BC powders/Meloxicam/Diclofenac/Indomethacin and other Nonsteroidal anti-inflammatory medications as these will make you more likely to bleed and can cause stomach ulcers, can also cause Kidney problems.   2)Repeat CBC and BMP Blood tests in 4 to 5 days advised  3)Very Low-salt diet advised---Less than 2 gm of Sodium per day advised----ok to use Mrs DASH salt substitute instead of Salt  4)Weigh yourself daily, call if you gain more than 3 pounds in 1 day or more than 5 pounds in 1 week as your diuretic medications may need to be adjusted

## 2024-10-12 NOTE — Progress Notes (Signed)
 Mobility Specialist Progress Note:    10/12/24 1103  Mobility  Activity Ambulated with assistance  Level of Assistance Minimal assist, patient does 75% or more  Assistive Device Front wheel walker  Distance Ambulated (ft) 12 ft  Range of Motion/Exercises Active;All extremities  Activity Response Tolerated well  Mobility Referral Yes  Mobility visit 1 Mobility  Mobility Specialist Start Time (ACUTE ONLY) 1103  Mobility Specialist Stop Time (ACUTE ONLY) 1123  Mobility Specialist Time Calculation (min) (ACUTE ONLY) 20 min   Pt received in bed, RN requesting pt walk. Required MinA for bed mobility and sit to stand. Required CGA to ambulate with RW. Tolerated well, SpO2 in low to mid 90's during ambulation. Left in chair, RN in room. All needs met.  Ripken Rekowski Mobility Specialist Please contact via Special Educational Needs Teacher or  Rehab office at [phone]

## 2024-10-12 NOTE — Plan of Care (Signed)

## 2024-10-12 NOTE — Inpatient Diabetes Management (Signed)
 Inpatient Diabetes Program Recommendations  AACE/ADA: New Consensus Statement on Inpatient Glycemic Control   Target Ranges:  Prepandial:   less than 140 mg/dL      Peak postprandial:   less than 180 mg/dL (1-2 hours)      Critically ill patients:  140 - 180 mg/dL   Lab Results  Component Value Date   GLUCAP 201 (H) 10/12/2024   HGBA1C 9.0 (H) 10/08/2024    Latest Reference Range & Units 10/11/24 07:43 10/11/24 11:15 10/11/24 16:24 10/11/24 19:43 10/12/24 07:30  Glucose-Capillary 70 - 99 mg/dL 642 (H) 698 (H) 756 (H) 216 (H) 201 (H)   Review of Glycemic Control  Diabetes history: DM2  Outpatient Diabetes medications:  Lantus  20 at bedtime, Metformin  750 QAM   Current orders for Inpatient glycemic control:  Lantus  3 units BID Novolog  0-9 units TID + Novolog  0-5 units at bedtime    Inpatient Diabetes Program Recommendations:    Please consider increasing Lantus  to 5 units BID  Thanks,  Lavanda Search, RN, MSN, [facility]  Inpatient Diabetes Coordinator  Pager [phone] (8a-5p)

## 2024-10-12 NOTE — Progress Notes (Signed)
 Attempted to call [facility] twice for report.  No answer. Daughter taking pt to facility. Daughter instructed to give discharge packet to facility.

## 2024-10-12 NOTE — Discharge Summary (Signed)
 Kylie Ward, is a 78 y.o. female  DOB 25-Jan-1946  MRN [id number].  Admission date:  10/08/2024  Admitting Physician  Terry LOISE Hurst, DO  Discharge Date:  10/12/2024   Primary MD  Gladis Mustard, FNP  Recommendations for primary care physician for things to follow:  1)Avoid ibuprofen/Advil/Aleve/Motrin/Goody Powders/Naproxen/BC powders/Meloxicam/Diclofenac/Indomethacin and other Nonsteroidal anti-inflammatory medications as these will make you more likely to bleed and can cause stomach ulcers, can also cause Kidney problems.   2)Repeat CBC and BMP Blood tests in 4 to 5 days advised  3)Very Low-salt diet advised---Less than 2 gm of Sodium per day advised----ok to use Mrs DASH salt substitute instead of Salt  4)Weigh yourself daily, call if you gain more than 3 pounds in 1 day or more than 5 pounds in 1 week as your diuretic medications may need to be adjusted    Admission Diagnosis  Transient neurologic deficit [R29.818]   Discharge Diagnosis  Transient neurologic deficit [R29.818]    Principal Problem:   Transient neurologic deficit      Past Medical History:  Diagnosis Date   Anxiety    Diabetes mellitus without complication (HCC)    Hyperlipidemia    Hypertension    Stroke [facility])     Past Surgical History:  Procedure Laterality Date   CAROTID ENDARTERECTOMY Right    CAROTID STENT     LEFT HEART CATH AND CORONARY ANGIOGRAPHY N/A 04/07/2023   Procedure: LEFT HEART CATH AND CORONARY ANGIOGRAPHY;  Surgeon: [name], MD;  Location: [facility] INVASIVE CV LAB;  Service: Cardiovascular;  Laterality: N/A;     HPI  from the history and physical done on the day of admission:    Chief Complaint: Facial droop, speech deficit.   HPI: Kylie Ward is a 78 y.o. female with medical history significant for dementia, type 2 diabetes, hyperlipidemia, coronary artery disease (not a candidate for  CABG), LBBB, chronic HFpEF, history of CVA, chronic anxiety/depression, CKD 3B, who presented to the ER with facial droop and speech deficit.  Code stroke was activated.  Seen by neurologist/stroke team.  Noncontrast head CT showed no acute intracranial abnormality, remote left frontal infarct and remote lacunar infarct in the left basal ganglia.   Upon arrival to the ER, the patient was noted to be hypotensive with SBP's in the 70s.  Blood pressure improved after IV fluid bolus.  Temperature 96.6.  Leukocytosis 12.5 K with neutrophilia on lab studies.  UA positive for pyuria.   Neurologist recommended to continue to address hypotension, neurochecks, aspirin  325 mg, MRI of brain without contrast, if positive for acute stroke then CT angio head and neck and complete stroke workup.   Due to concern for UTI, the patient received Rocephin  in the ER.  TRH, hospitalist service, was asked to admit for further management.   High-sensitivity troponin elevated 146, repeat 102.  Old LBBB on twelve-lead EKG.  The patient admits to chest pain which is reproducible on palpation.   ED Course: Temperature 96.6.  BP 103/74, pulse 67, respiration rate  14, O2 saturation 97% on room air.   Review of Systems: Review of systems as noted in the HPI. All other systems reviewed and are negative.    Hospital Course:    Assessment and Plan: 1) Klebsiella UTI--- urine culture demonstrates mostly pansensitive Klebsiella -Improved after treatment with IV Rocephin  - Okay to discharge on a couple of days of Keflex to complete treatment,  2)Hypotension/Hypovolemic shock most likely--in the setting of dehydration and UTI - #1 above/UTI was probably contributory -- Responded well to IV fluids actually became volume overloaded and required IV Lasix  for diuresis -- Continue midodrine  for now to allow for CHF medications to be used without dropping BP  3)AKI----acute kidney injury on CKD stage - 3A -  creatinine on  admission= 2.61,  baseline creatinine = 1.69 on 03/20/2024   ,  creatinine is now= back down to 1.70, -Discontinue metformin  - renally adjust medications, avoid nephrotoxic agents / dehydration  / hypotension  4-dementia - Stable without significant behavioral issues - Continue constant reorientation - Continue supportive care.   5-type 2 diabetes mellitus -A1c is 9.0 reflecting uncontrolled DM PTA -Stop metformin  due to kidney concerns -Continue insulin  therapy   7-acute exacerbation of chronic diastolic dysfunction CHF-  -Repeat echo with  EF at 50% up from previous EF of 30 to 35% -Developed volume overload due to IV fluids in the setting of #1 #3 above -Responded well to IV Lasix  -Cardiac medications including Aldactone  metoprolol  XL and torsemide  adjusted due to soft BP -Continue Entresto    8-chronic anxiety/depression - Continue home medication regimen - No suicidal ideation or hallucination.   9-Generalized weakness/deconditioning ambulatory dysfunction --Initially low suspicion of possible stroke on admission however stroke workup is negative - Due to AKI, CHF, acute hypoxic respiratory failure which is now resolved and Klebsiella UTI --Patient will benefit from short-term skilled nursing facility rehab prior to returning to long-term care  10) acute hypoxic respiratory failure--- due to volume overload in the setting of IV fluids for dehydration/AKI/UTI in the setting of underlying diastolic congestive heart failure --Hypoxia resolved with Lasix  IV -Post ambulation O2 sats 91 to 92% on room air   Discharge Condition: Stable  Follow UP   Follow-up Information     Gladis Mustard, FNP. Schedule an appointment as soon as possible for a visit in 1 week(s).   Specialty: Family Medicine Why: Repeat CBC and BMP in 4 to 5 days Contact information: [address] [place] [address] [phone]                 Diet and Activity recommendation:   As advised  Discharge Instructions    Discharge Instructions     Call MD for:  persistant dizziness or light-headedness   Complete by: As directed    Call MD for:  persistant nausea and vomiting   Complete by: As directed    Call MD for:  temperature >100.4   Complete by: As directed    Discharge instructions   Complete by: As directed    1)Avoid ibuprofen/Advil/Aleve/Motrin/Goody Powders/Naproxen/BC powders/Meloxicam/Diclofenac/Indomethacin and other Nonsteroidal anti-inflammatory medications as these will make you more likely to bleed and can cause stomach ulcers, can also cause Kidney problems.   2)Repeat CBC and BMP Blood tests in 4 to 5 days advised  3)Very Low-salt diet advised---Less than 2 gm of Sodium per day advised----ok to use Mrs DASH salt substitute instead of Salt  4)Weigh yourself daily, call if you gain more than 3 pounds in 1 day  or more than 5 pounds in 1 week as your diuretic medications may need to be adjusted   Discharge wound care:   Complete by: As directed    Heel protective dressing   Increase activity slowly   Complete by: As directed        Discharge Medications     Allergies as of 10/12/2024       Reactions   Crestor  [rosuvastatin ] Other (See Comments)   Myalgias and arthralgias to the point she had difficulty walking. She was tolerant of lipitor from the family's recall of events.  Lipitor was listed on MAR for allergies, NOT Crestor    Niaspan [niacin Er (antihyperlipidemic)] Itching   Skin burning        Medication List     STOP taking these medications    metFORMIN  750 MG 24 hr tablet Commonly known as: GLUCOPHAGE -XR       TAKE these medications    Accu-Chek SmartView test strip Generic drug: glucose blood CHECK BLOOD SUGAR UP TO 3 TIMES A DAY AS DIRECTED   ALPRAZolam  1 MG tablet Commonly known as: XANAX  Take 1 tablet (1 mg total) by mouth 2 (two) times daily as needed for anxiety. What changed:  when to take  this reasons to take this   atorvastatin  40 MG tablet Commonly known as: LIPITOR Take 1 tablet (40 mg total) by mouth daily. What changed: when to take this   cephALEXin 500 MG capsule Commonly known as: KEFLEX Take 1 capsule (500 mg total) by mouth 3 (three) times daily for 2 days. Start taking on: October 13, 2024   clopidogrel  75 MG tablet Commonly known as: PLAVIX  Take 1 tablet (75 mg total) by mouth daily.   dapagliflozin  propanediol 10 MG Tabs tablet Commonly known as: Farxiga  Take 1 tablet (10 mg total) by mouth daily before breakfast.   donepezil  10 MG tablet Commonly known as: ARICEPT  Take 1 tablet (10 mg total) by mouth at bedtime.   Embecta AutoShield Duo 30G X 5 MM Misc Generic drug: Insulin  Pen Needle   Entresto  24-26 MG Generic drug: sacubitril -valsartan  TAKE ONE TABLET TWICE DAILY   escitalopram  20 MG tablet Commonly known as: LEXAPRO  Take 1 tablet (20 mg total) by mouth daily.   FreeStyle Libre 2 Sensor Misc CHANGE SENSOR ON BACK OF ARM EVERY 14 DAYS AS DIRECTED TO CHECK BLOOD SUGAR CONTINUOUSLY   Insulin  Aspart FlexPen 100 UNIT/ML Commonly known as: NOVOLOG  Inject 0-10 Units into the skin 3 (three) times daily before meals. Inject as per sliding scale 150-200 = 2 units 201-250 = 4 units 251-300 = 6 units 301-350 = 8 units 351-400 = 10 units >450 = 10 units + recheck in 1 hr, if still elevated call MD   Lantus  SoloStar 100 UNIT/ML Solostar Pen Generic drug: insulin  glargine Inject 10 Units into the skin at bedtime. What changed: how much to take   memantine  10 MG tablet Commonly known as: NAMENDA  Take 1 tablet (10 mg total) by mouth 2 (two) times daily.   metoprolol  succinate 25 MG 24 hr tablet Commonly known as: TOPROL -XL Take 0.5 tablets (12.5 mg total) by mouth daily. What changed: how much to take   midodrine  10 MG tablet Commonly known as: PROAMATINE  Take 1 tablet (10 mg total) by mouth 3 (three) times daily.   polyethylene glycol  powder 17 GM/SCOOP powder Commonly known as: GLYCOLAX /MIRALAX  Take 17 g by mouth daily. What changed:  when to take this reasons to take this   spironolactone   25 MG tablet Commonly known as: ALDACTONE  Take 0.5 tablets (12.5 mg total) by mouth daily. What changed: how much to take   torsemide  20 MG tablet Commonly known as: DEMADEX  Take 1 tablet (20 mg total) by mouth daily. What changed: how much to take   traZODone  50 MG tablet Commonly known as: DESYREL  Take 1 tablet (50 mg total) by mouth at bedtime.               Discharge Care Instructions  (From admission, onward)           Start     Ordered   10/12/24 0000  Discharge wound care:       Comments: Heel protective dressing   10/12/24 1246            Major procedures and Radiology Reports - PLEASE review detailed and final reports for all details, in brief -   DG Chest 1 View Result Date: 10/11/2024 CLINICAL DATA:  Shortness of breath. EXAM: CHEST  1 VIEW COMPARISON:  10/08/2024 FINDINGS: The cardio pericardial silhouette is enlarged. Diffuse interstitial opacity is compatible with edema. Minimal basilar airspace disease likely edematous although superimposed pneumonia not excluded. No substantial pleural effusion. No acute bony abnormality. Telemetry leads overlie the chest. IMPRESSION: Enlargement of the cardiopericardial silhouette with diffuse interstitial opacity compatible with edema. Electronically Signed   By: Camellia Candle M.D.   On: 10/11/2024 05:43   ECHOCARDIOGRAM LIMITED Result Date: 10/10/2024    ECHOCARDIOGRAM LIMITED REPORT   Patient Name:   MAELYNN MORONEY Date of Exam: 10/10/2024 Medical Rec #:  [id number]         Height:       62.0 in Accession #:    [id number]        Weight:       153.7 lb Date of Birth:  05/16/1946         BSA:          1.709 m Patient Age:    78 years          BP:           119/47 mmHg Patient Gender: F                 HR:           66 bpm. Exam Location:  [facility]  Procedure: Limited Echo, Cardiac Doppler and Color Doppler (Both Spectral and            Color Flow Doppler were utilized during procedure). Indications:    Elevated Troponin  History:        Patient has prior history of Echocardiogram examinations, most                 recent 10/05/2024. CHF, CAD, Stroke, Signs/Symptoms:Edema; Risk                 Factors:Dyslipidemia, Diabetes, Hypertension and Former Smoker.  Sonographer:    Juliene Rucks Referring Phys: [id number] TERRY LOISE HURST  Sonographer Comments: Patient is obese and suboptimal parasternal window. IMPRESSIONS  1. Limited visualization. Roughly LVEF is 50%. . Left ventricular endocardial border not optimally defined to evaluate regional wall motion. Left ventricular diastolic parameters are consistent with Grade I diastolic dysfunction (impaired relaxation).  2. Right ventricular systolic function was not well visualized. The right ventricular size is not well visualized.  3. Limited echocardiogram  4. FINDINGS  Left Ventricle: Limited visualization. Roughly LVEF is 50%. Left ventricular endocardial border not optimally  defined to evaluate regional wall motion. Left ventricular diastolic parameters are consistent with Grade I diastolic dysfunction (impaired relaxation). Right Ventricle: The right ventricular size is not well visualized. Right vetricular wall thickness was not well visualized. Right ventricular systolic function was not well visualized.  Diastology LV e' medial:    3.24 cm/s LV E/e' medial:  24.1 LV e' lateral:   3.86 cm/s LV E/e' lateral: 20.2  AORTIC VALVE LVOT Vmax:   79.60 cm/s LVOT Vmean:  51.800 cm/s LVOT VTI:    0.172 m MITRAL VALVE MV Area (PHT): 2.76 cm     SHUNTS MV Decel Time: 275 msec     Systemic VTI: 0.17 m MV E velocity: 78.10 cm/s MV A velocity: 160.00 cm/s MV E/A ratio:  0.49 Dorn Ross MD Electronically signed by Dorn Ross MD Signature Date/Time: 10/10/2024/6:42:43 PM    Final    MR BRAIN WO CONTRAST Result Date:  10/09/2024 EXAM: MRI BRAIN WITHOUT CONTRAST 10/09/2024 11:37:33 AM TECHNIQUE: Multiplanar multisequence MRI of the head/brain was performed without the administration of intravenous contrast. COMPARISON: CT of the head dated 10/08/2024. CLINICAL HISTORY: Stroke suspected (Ped 0-17y). FINDINGS: BRAIN AND VENTRICLES: Age-related atrophy. Moderately advanced periventricular and deep cerebral white matter disease. Chronic encephalomalacia changes are again noted within the left posterior frontal lobe and basal ganglia. There is no restricted diffusion to indicate acute or recent infarction. No intracranial hemorrhage. No mass. No midline shift. No hydrocephalus. The sella is unremarkable. Normal flow voids. ORBITS: The patient is status post bilateral lens replacement. No acute abnormality. SINUSES AND MASTOIDS: No acute abnormality. BONES AND SOFT TISSUES: Normal marrow signal. No acute soft tissue abnormality. IMPRESSION: 1. No acute findings. 2. Chronic encephalomalacia changes within the left posterior frontal lobe and basal ganglia. 3. Age-related atrophy and moderately advanced periventricular and deep cerebral white matter disease. Electronically signed by: Evalene Coho MD 10/09/2024 11:41 AM EST RP Workstation: [id number]   DG Chest Port 1 View Result Date: 10/08/2024 EXAM: 1 VIEW(S) XRAY OF THE CHEST 10/08/2024 08:01:00 PM COMPARISON: Chest CT without contrast 03/19/2024. CLINICAL HISTORY: weak weak FINDINGS: LINES, TUBES AND DEVICES: Overlying monitor leads. LUNGS AND PLEURA: There is a low inspiration on exam with bronchovascular crowding. No focal pneumonia is evident with limited view of the bases. No vascular congestion is seen. No pleural effusion. No pneumothorax. HEART AND MEDIASTINUM: Mild cardiomegaly. The mitral ring is heavily calcified. There is tortuosity and moderate calcification of the aorta. Stable mediastinum. BONES AND SOFT TISSUES: Degenerative change and mild levoscoliosis of the  thoracic spine. IMPRESSION: 1. No acute cardiopulmonary findings. Low lung volumes. 2. Mild cardiomegaly with heavily calcified mitral valve annulus. 3. Aortic tortuosity with moderate atherosclerotic calcification. Electronically signed by: Francis Quam MD 10/08/2024 08:07 PM EST RP Workstation: [id number]   CT HEAD CODE STROKE WO CONTRAST (LKW 0-4.5h, LVO 0-24h) Result Date: 10/08/2024 EXAM: CT HEAD WITHOUT CONTRAST 10/08/2024 06:21:00 PM TECHNIQUE: CT of the head was performed without the administration of intravenous contrast. Automated exposure control, iterative reconstruction, and/or weight based adjustment of the mA/kV was utilized to reduce the radiation dose to as low as reasonably achievable. COMPARISON: None available. CLINICAL HISTORY: Neuro deficit, acute, stroke suspected. FINDINGS: BRAIN AND VENTRICLES: No acute hemorrhage. Remote left frontal infarct. Patchy white matter hypodensities compatible with chronic microvascular ischemic change. Remote lacunar infarct in the left basal ganglia. Partially empty sella. Calcific atherosclerosis. No evidence of acute infarct. No hydrocephalus. No extra-axial collection. No mass effect or midline shift. ORBITS: Bilateral lens replacement. SINUSES:  No acute abnormality. SOFT TISSUES AND SKULL: No acute soft tissue abnormality. No skull fracture. [place] Stroke Program Early CT (ASPECT) Score: Ganglionic (caudate, internal capsule, lentiform nucleus, insula, M1-M3): 7 Supraganglionic (M4-M6): 3 Total: 10 IMPRESSION: 1. No acute intracranial abnormality. ASPECTS 10. 2. Remote left frontal infarct and remote lacunar infarct in the left basal ganglia. 3. Findings messaged to Dr. Germaine via the [facility] messaging system at 6:26 PM and called to Dr. Zammit at [facility] on 10/08/24. Electronically signed by: Donnice Mania MD 10/08/2024 06:29 PM EST RP Workstation: [id number]   ECHOCARDIOGRAM COMPLETE Result Date: 10/05/2024    ECHOCARDIOGRAM REPORT   Patient Name:    LAURALI GODDARD Date of Exam: 10/05/2024 Medical Rec #:  [id number]         Height:       62.0 in Accession #:    [id number]        Weight:       159.0 lb Date of Birth:  [birth year]         BSA:          1.734 m Patient Age:    78 years          BP:           95/58 mmHg Patient Gender: F                 HR:           72 bpm. Exam Location:  [address] Procedure: 2D Echo, Cardiac Doppler and Color Doppler (Both Spectral and Color            Flow Doppler were utilized during procedure). Indications:    I50.21 Acute Systolic CHF  History:        Patient has prior history of Echocardiogram examinations, most                 recent 03/19/2024. Risk Factors:Hypertension, Diabetes and                 Dyslipidemia.  Sonographer:    Carl Rodgers-Jones RDCS Referring Phys: [id number] TESSA N CONTE IMPRESSIONS  1. Left ventricular ejection fraction, by estimation, is 50 to 55%. The left ventricle has low normal function. The left ventricle demonstrates regional wall motion abnormalities (see scoring diagram/findings for description). There is moderate left ventricular hypertrophy. Left ventricular diastolic function could not be evaluated.  2. Right ventricular systolic function is normal. The right ventricular size is normal. There is normal pulmonary artery systolic pressure. The estimated right ventricular systolic pressure is 31.1 mmHg.  3. The mitral valve is degenerative. Mild mitral valve regurgitation. Mild mitral stenosis. Severe mitral annular calcification.  4. The aortic valve is normal in structure. Aortic valve regurgitation is not visualized. No aortic stenosis is present.  5. The inferior vena cava is normal in size with greater than 50% respiratory variability, suggesting right atrial pressure of 3 mmHg. Comparison(s): A prior study was performed on 03/19/2024. LVEF 35-40%, global hypokinesis, elevated LAP, mild MR, estimated RAP . FINDINGS  Left Ventricle: Left ventricular ejection fraction, by  estimation, is 50 to 55%. The left ventricle has low normal function. The left ventricle demonstrates regional wall motion abnormalities. The left ventricular internal cavity size was normal in size. There is moderate left ventricular hypertrophy. Left ventricular diastolic function could not be evaluated due to mitral annular calcification (moderate or greater). Left ventricular diastolic function could not be evaluated.  LV Wall Scoring: The basal inferior segment and basal inferoseptal  segment are akinetic. The entire anterior septum, mid and distal inferior wall, and mid inferoseptal segment are hypokinetic. The entire anterior wall, entire lateral wall, and apex are normal. Right Ventricle: The right ventricular size is normal. No increase in right ventricular wall thickness. Right ventricular systolic function is normal. There is normal pulmonary artery systolic pressure. The tricuspid regurgitant velocity is 2.65 m/s, and  with an assumed right atrial pressure of 3 mmHg, the estimated right ventricular systolic pressure is 31.1 mmHg. Left Atrium: Left atrial size was normal in size. Right Atrium: Right atrial size was normal in size. Pericardium: There is no evidence of pericardial effusion. The mitral valve is degenerative in appearance. Severe mitral annular calcification. Mild mitral valve regurgitation. Mild mitral valve stenosis. . Tricuspid Valve: The tricuspid valve is normal in structure. Tricuspid valve regurgitation is mild . No evidence of tricuspid stenosis. Aortic Valve: The aortic valve is normal in structure. Aortic valve regurgitation is not visualized. No aortic stenosis is present. Pulmonic Valve: The pulmonic valve was normal in structure. Pulmonic valve regurgitation is not visualized. No evidence of pulmonic stenosis. Aorta: The aortic root and ascending aorta are structurally normal, with no evidence of dilitation. Venous: The inferior vena cava is normal in size with greater than 50%  respiratory variability, suggesting right atrial pressure of 3 mmHg. IAS/Shunts: The atrial septum is grossly normal.  LEFT VENTRICLE PLAX 2D LVIDd:         4.30 cm   Diastology LVIDs:         2.80 cm   LV e' medial:    4.68 cm/s LV PW:         1.20 cm   LV E/e' medial:  14.9 LV IVS:        1.55 cm   LV e' lateral:   7.29 cm/s LVOT diam:     1.80 cm   LV E/e' lateral: 9.5 LV SV:         46 LV SV Index:   27 LVOT Area:     2.54 cm  RIGHT VENTRICLE RV Basal diam:  3.40 cm RV S prime:     10.65 cm/s TAPSE (M-mode): 2.0 cm LEFT ATRIUM             Index        RIGHT ATRIUM          Index LA diam:        4.20 cm 2.42 cm/m   RA Area:     6.49 cm LA Vol (A2C):   29.8 ml 17.19 ml/m  RA Volume:   11.20 ml 6.46 ml/m LA Vol (A4C):   35.3 ml 20.36 ml/m LA Biplane Vol: 33.7 ml 19.44 ml/m  AORTIC VALVE LVOT Vmax:   87.07 cm/s LVOT Vmean:  55.100 cm/s LVOT VTI:    0.182 m  AORTA Ao Root diam: 3.20 cm Ao Asc diam:  3.10 cm MITRAL VALVE                TRICUSPID VALVE MV Area (PHT): 2.83 cm     TR Peak grad:   28.1 mmHg MV Area VTI:   1.25 cm     TR Vmax:        265.00 cm/s MV Peak grad:  12.0 mmHg MV Mean grad:  3.0 mmHg     SHUNTS MV Vmax:       1.73 m/s     Systemic VTI:  0.18 m MV Vmean:  82.3 cm/s    Systemic Diam: 1.80 cm MV Decel Time: 268 msec MV E velocity: 69.50 cm/s MV A velocity: 179.00 cm/s MV E/A ratio:  0.39 Sunit Tolia Electronically signed by Madonna Large Signature Date/Time: 10/05/2024/6:22:33 PM    Final     Micro Results   Recent Results (from the past 240 hours)  Blood culture (routine x 2)     Status: None (Preliminary result)   Collection Time: 10/08/24  7:29 PM   Specimen: BLOOD  Result Value Ref Range Status   Specimen Description BLOOD BLOOD LEFT WRIST  Final   Special Requests   Final    BOTTLES DRAWN AEROBIC ONLY Blood Culture results may not be optimal due to an inadequate volume of blood received in culture bottles   Culture   Final    NO GROWTH 4 DAYS Performed at [facility], [address] [place], [place] [address]    Report Status PENDING  Incomplete  Blood culture (routine x 2)     Status: None (Preliminary result)   Collection Time: 10/08/24  7:29 PM   Specimen: BLOOD  Result Value Ref Range Status   Specimen Description BLOOD RIGHT ANTECUBITAL  Final   Special Requests   Final    BOTTLES DRAWN AEROBIC AND ANAEROBIC Blood Culture results may not be optimal due to an inadequate volume of blood received in culture bottles   Culture   Final    NO GROWTH 4 DAYS Performed at [facility], [address] [place], [place] [address]    Report Status PENDING  Incomplete  Urine Culture     Status: Abnormal   Collection Time: 10/08/24  8:59 PM   Specimen: Urine, Catheterized  Result Value Ref Range Status   Specimen Description   Final    URINE, CATHETERIZED Performed at [facility], [address] [place], [place] [address]    Special Requests   Final    NONE Performed at [facility], [address] [place], [place] [address]    Culture >=100,000 COLONIES/mL KLEBSIELLA PNEUMONIAE (A)  Final   Report Status 10/11/2024 FINAL  Final   Organism ID, Bacteria KLEBSIELLA PNEUMONIAE (A)  Final      Susceptibility   Klebsiella pneumoniae - MIC*    AMPICILLIN RESISTANT Resistant     CEFAZOLIN  (URINE) Value in next row Sensitive      2 SENSITIVEThis is a modified FDA-approved test that has been validated and its performance characteristics determined by the reporting laboratory.  This laboratory is certified under the Clinical Laboratory Improvement Amendments CLIA as qualified to perform high complexity clinical laboratory testing.    CEFEPIME Value in next row Sensitive      2 SENSITIVEThis is a modified FDA-approved test that has been validated and its performance characteristics determined by the reporting laboratory.  This laboratory is certified under the Clinical Laboratory Improvement Amendments CLIA as qualified to perform high complexity clinical  laboratory testing.    ERTAPENEM Value in next row Sensitive      2 SENSITIVEThis is a modified FDA-approved test that has been validated and its performance characteristics determined by the reporting laboratory.  This laboratory is certified under the Clinical Laboratory Improvement Amendments CLIA as qualified to perform high complexity clinical laboratory testing.    CEFTRIAXONE  Value in next row Sensitive      2 SENSITIVEThis is a modified FDA-approved test that has been validated and its performance characteristics determined by the reporting laboratory.  This laboratory is certified  under the Clinical Laboratory Improvement Amendments CLIA as qualified to perform high complexity clinical laboratory testing.    CIPROFLOXACIN  Value in next row Sensitive      2 SENSITIVEThis is a modified FDA-approved test that has been validated and its performance characteristics determined by the reporting laboratory.  This laboratory is certified under the Clinical Laboratory Improvement Amendments CLIA as qualified to perform high complexity clinical laboratory testing.    GENTAMICIN Value in next row Sensitive      2 SENSITIVEThis is a modified FDA-approved test that has been validated and its performance characteristics determined by the reporting laboratory.  This laboratory is certified under the Clinical Laboratory Improvement Amendments CLIA as qualified to perform high complexity clinical laboratory testing.    NITROFURANTOIN Value in next row Sensitive      2 SENSITIVEThis is a modified FDA-approved test that has been validated and its performance characteristics determined by the reporting laboratory.  This laboratory is certified under the Clinical Laboratory Improvement Amendments CLIA as qualified to perform high complexity clinical laboratory testing.    TRIMETH /SULFA  Value in next row Sensitive      2 SENSITIVEThis is a modified FDA-approved test that has been validated and its performance  characteristics determined by the reporting laboratory.  This laboratory is certified under the Clinical Laboratory Improvement Amendments CLIA as qualified to perform high complexity clinical laboratory testing.    AMPICILLIN/SULBACTAM Value in next row Sensitive      2 SENSITIVEThis is a modified FDA-approved test that has been validated and its performance characteristics determined by the reporting laboratory.  This laboratory is certified under the Clinical Laboratory Improvement Amendments CLIA as qualified to perform high complexity clinical laboratory testing.    PIP/TAZO Value in next row Sensitive      <=4 SENSITIVEThis is a modified FDA-approved test that has been validated and its performance characteristics determined by the reporting laboratory.  This laboratory is certified under the Clinical Laboratory Improvement Amendments CLIA as qualified to perform high complexity clinical laboratory testing.    MEROPENEM Value in next row Sensitive      <=4 SENSITIVEThis is a modified FDA-approved test that has been validated and its performance characteristics determined by the reporting laboratory.  This laboratory is certified under the Clinical Laboratory Improvement Amendments CLIA as qualified to perform high complexity clinical laboratory testing.    * >=100,000 COLONIES/mL KLEBSIELLA PNEUMONIAE  Gastrointestinal Panel by PCR , Stool     Status: None   Collection Time: 10/08/24  9:15 PM   Specimen: Stool  Result Value Ref Range Status   Campylobacter species NOT DETECTED NOT DETECTED Final   Plesimonas shigelloides NOT DETECTED NOT DETECTED Final   Salmonella species NOT DETECTED NOT DETECTED Final   Yersinia enterocolitica NOT DETECTED NOT DETECTED Final   Vibrio species NOT DETECTED NOT DETECTED Final   Vibrio cholerae NOT DETECTED NOT DETECTED Final   Enteroaggregative E coli (EAEC) NOT DETECTED NOT DETECTED Final   Enteropathogenic E coli (EPEC) NOT DETECTED NOT DETECTED Final    Enterotoxigenic E coli (ETEC) NOT DETECTED NOT DETECTED Final   Shiga like toxin producing E coli (STEC) NOT DETECTED NOT DETECTED Final   Shigella/Enteroinvasive E coli (EIEC) NOT DETECTED NOT DETECTED Final   Cryptosporidium NOT DETECTED NOT DETECTED Final   Cyclospora cayetanensis NOT DETECTED NOT DETECTED Final   Entamoeba histolytica NOT DETECTED NOT DETECTED Final   Giardia lamblia NOT DETECTED NOT DETECTED Final   Adenovirus F40/41 NOT DETECTED NOT DETECTED Final  Astrovirus NOT DETECTED NOT DETECTED Final   Norovirus GI/GII NOT DETECTED NOT DETECTED Final   Rotavirus A NOT DETECTED NOT DETECTED Final   Sapovirus (I, II, IV, and V) NOT DETECTED NOT DETECTED Final    Comment: Performed at [facility], [address] [place], [place] [address]  MRSA Next Gen by PCR, Nasal     Status: None   Collection Time: 10/09/24 12:32 AM   Specimen: Nasal Mucosa; Nasal Swab  Result Value Ref Range Status   MRSA by PCR Next Gen NOT DETECTED NOT DETECTED Final    Comment: (NOTE) The GeneXpert MRSA Assay (FDA approved for NASAL specimens only), is one component of a comprehensive MRSA colonization surveillance program. It is not intended to diagnose MRSA infection nor to guide or monitor treatment for MRSA infections. Test performance is not FDA approved in patients less than 54 years old. Performed at [facility], [address], [place], [place] [address]   Resp panel by RT-PCR (RSV, Flu A&B, Covid) Anterior Nasal Swab     Status: None   Collection Time: 10/11/24  5:44 AM   Specimen: Anterior Nasal Swab  Result Value Ref Range Status   SARS Coronavirus 2 by RT PCR NEGATIVE NEGATIVE Final    Comment: (NOTE) SARS-CoV-2 target nucleic acids are NOT DETECTED.  The SARS-CoV-2 RNA is generally detectable in upper respiratory specimens during the acute phase of infection. The lowest concentration of SARS-CoV-2 viral copies this assay can detect is 138 copies/mL. A negative result  does not preclude SARS-Cov-2 infection and should not be used as the sole basis for treatment or other patient management decisions. A negative result may occur with  improper specimen collection/handling, submission of specimen other than nasopharyngeal swab, presence of viral mutation(s) within the areas targeted by this assay, and inadequate number of viral copies(<138 copies/mL). A negative result must be combined with clinical observations, patient history, and epidemiological information. The expected result is Negative.  Fact Sheet for Patients:  bloggercourse.com  Fact Sheet for Healthcare Providers:  seriousbroker.it  This test is no t yet approved or cleared by the [place]  FDA and  has been authorized for detection and/or diagnosis of SARS-CoV-2 by FDA under an Emergency Use Authorization (EUA). This EUA will remain  in effect (meaning this test can be used) for the duration of the COVID-19 declaration under Section 564(b)(1) of the Act, 21 U.S.C.section 360bbb-3(b)(1), unless the authorization is terminated  or revoked sooner.       Influenza A by PCR NEGATIVE NEGATIVE Final   Influenza B by PCR NEGATIVE NEGATIVE Final    Comment: (NOTE) The Xpert Xpress SARS-CoV-2/FLU/RSV plus assay is intended as an aid in the diagnosis of influenza from Nasopharyngeal swab specimens and should not be used as a sole basis for treatment. Nasal washings and aspirates are unacceptable for Xpert Xpress SARS-CoV-2/FLU/RSV testing.  Fact Sheet for Patients: bloggercourse.com  Fact Sheet for Healthcare Providers: seriousbroker.it  This test is not yet approved or cleared by the [place]  FDA and has been authorized for detection and/or diagnosis of SARS-CoV-2 by FDA under an Emergency Use Authorization (EUA). This EUA will remain in effect (meaning this test can be used) for  the duration of the COVID-19 declaration under Section 564(b)(1) of the Act, 21 U.S.C. section 360bbb-3(b)(1), unless the authorization is terminated or revoked.     Resp Syncytial Virus by PCR NEGATIVE NEGATIVE Final    Comment: (NOTE) Fact Sheet for Patients: bloggercourse.com  Fact Sheet for  Healthcare Providers: seriousbroker.it  This test is not yet approved or cleared by the [place]  FDA and has been authorized for detection and/or diagnosis of SARS-CoV-2 by FDA under an Emergency Use Authorization (EUA). This EUA will remain in effect (meaning this test can be used) for the duration of the COVID-19 declaration under Section 564(b)(1) of the Act, 21 U.S.C. section 360bbb-3(b)(1), unless the authorization is terminated or revoked.  Performed at [facility], [address], [place], [place] [address]     Today   Subjective    Braylin Formby today has no new complaints - Patient's daughter Lorn is at bedside - Patient out of bed with RN and mobility specialist--- O2 sats 91 to 92% on room air =-          Patient has been seen and examined prior to discharge   Objective   Blood pressure (!) 110/49, pulse 76, temperature (!) 97.3 F (36.3 C), temperature source Oral, resp. rate 18, height 5' 2 (1.575 m), weight 68.7 kg, last menstrual period 01/27/1983, SpO2 95%.   Intake/Output Summary (Last 24 hours) at 10/12/2024 1317 Last data filed at 10/12/2024 0900 Gross per 24 hour  Intake 260 ml  Output 500 ml  Net -240 ml    Exam Gen:- Awake Alert, no acute distress  HEENT:- [place].AT, No sclera icterus Neck-Supple Neck,No JVD,.  Lungs-improved air movement, no wheezing or rales CV- S1, S2 normal, regular Abd-  +ve B.Sounds, Abd Soft, No tenderness,    Extremity/Skin:- No  edema,   good pulses Psych-affect is appropriate, oriented x3 Neuro-generalized weakness, no new focal deficits, no tremors   MSK--protective dressings to both heels   Data Review   CBC w Diff:  Lab Results  Component Value Date   WBC 15.0 (H) 10/10/2024   HGB 12.5 10/10/2024   HGB 12.0 01/19/2024   HCT 39.4 10/10/2024   HCT 36.1 01/19/2024   PLT 297 10/10/2024   PLT 256 01/19/2024   LYMPHOPCT 17 10/09/2024   MONOPCT 6 10/09/2024   EOSPCT 0 10/09/2024   BASOPCT 0 10/09/2024   CMP:  Lab Results  Component Value Date   NA 138 10/12/2024   NA 136 03/30/2024   K 4.1 10/12/2024   CL 106 10/12/2024   CO2 19 (L) 10/12/2024   BUN 30 (H) 10/12/2024   BUN 56 (H) 03/30/2024   CREATININE 1.70 (H) 10/12/2024   CREATININE 0.77 05/11/2013   PROT 7.2 10/08/2024   PROT 6.7 01/19/2024   ALBUMIN 4.0 10/08/2024   ALBUMIN 4.4 01/19/2024   BILITOT 0.3 10/08/2024   BILITOT <0.2 01/19/2024   ALKPHOS 159 (H) 10/08/2024   AST 36 10/08/2024   ALT 24 10/08/2024  .  Total Discharge time is about 33 minutes  Rendall Carwin M.D on 10/12/2024 at 1:17 PM  Go to [email or web address] -  for contact info  [facility] - Office  [phone]

## 2024-10-13 LAB — CULTURE, BLOOD (ROUTINE X 2)
Culture: NO GROWTH
Culture: NO GROWTH

## 2024-10-31 ENCOUNTER — Ambulatory Visit (HOSPITAL_COMMUNITY)

## 2024-10-31 ENCOUNTER — Ambulatory Visit: Admitting: Vascular Surgery

## 2024-11-02 ENCOUNTER — Ambulatory Visit (HOSPITAL_COMMUNITY)

## 2024-11-07 ENCOUNTER — Ambulatory Visit (HOSPITAL_COMMUNITY)

## 2024-11-07 ENCOUNTER — Ambulatory Visit: Admitting: Vascular Surgery

## 2024-11-15 ENCOUNTER — Ambulatory Visit (HOSPITAL_COMMUNITY)
Admission: RE | Admit: 2024-11-15 | Discharge: 2024-11-15 | Disposition: A | Discharge status: Home / Self Care | Source: Ambulatory Visit | Attending: Cardiology | Admitting: Cardiology

## 2024-11-15 ENCOUNTER — Encounter (HOSPITAL_COMMUNITY): Payer: Self-pay | Admitting: Cardiology

## 2024-11-15 VITALS — BP 110/60 | HR 63

## 2024-11-15 DIAGNOSIS — F0390 Unspecified dementia without behavioral disturbance: Secondary | ICD-10-CM | POA: Diagnosis not present

## 2024-11-15 DIAGNOSIS — I2510 Atherosclerotic heart disease of native coronary artery without angina pectoris: Secondary | ICD-10-CM | POA: Insufficient documentation

## 2024-11-15 DIAGNOSIS — Z95820 Peripheral vascular angioplasty status with implants and grafts: Secondary | ICD-10-CM | POA: Diagnosis not present

## 2024-11-15 DIAGNOSIS — N1832 Chronic kidney disease, stage 3b: Secondary | ICD-10-CM | POA: Diagnosis not present

## 2024-11-15 DIAGNOSIS — I25110 Atherosclerotic heart disease of native coronary artery with unstable angina pectoris: Secondary | ICD-10-CM | POA: Diagnosis not present

## 2024-11-15 DIAGNOSIS — Z7901 Long term (current) use of anticoagulants: Secondary | ICD-10-CM | POA: Diagnosis not present

## 2024-11-15 DIAGNOSIS — I255 Ischemic cardiomyopathy: Secondary | ICD-10-CM | POA: Diagnosis not present

## 2024-11-15 DIAGNOSIS — I6523 Occlusion and stenosis of bilateral carotid arteries: Secondary | ICD-10-CM | POA: Diagnosis present

## 2024-11-15 DIAGNOSIS — I5022 Chronic systolic (congestive) heart failure: Secondary | ICD-10-CM | POA: Diagnosis present

## 2024-11-15 DIAGNOSIS — I739 Peripheral vascular disease, unspecified: Secondary | ICD-10-CM | POA: Insufficient documentation

## 2024-11-15 DIAGNOSIS — I252 Old myocardial infarction: Secondary | ICD-10-CM | POA: Diagnosis not present

## 2024-11-15 MED ORDER — MEMANTINE HCL 5 MG PO TABS: 5.0000 mg | ORAL_TABLET | Freq: Two times a day (BID) | ORAL | 3 refills | Status: AC

## 2024-11-15 NOTE — Progress Notes (Signed)
" ° °  ADVANCED HEART FAILURE NEW PATIENT CLINIC NOTE  Referring Physician: Gladis Mustard, FNP  Primary Care: Gladis Mustard, FNP Primary Cardiologist:  HPI: Kylie Ward is a 79 y.o. female with a PMH of CAD s/p NSTEMI 04/2023 with LHC revealing three-vessel disease (not a candidate for CABG due to comorbidities), HFrEF, dementia, carotid artery stenosis s/p left ICA stenting in 2011, right carotid endarterectomy in 2015 who presents for initial visit for further evaluation and treatment of heart failure/cardiomyopathy.      Originally seen 04/2023 for NSTEMI with severely reduced EF. LHC showed three vessel disease but patient was not a candidate for CABG given multiple comorbid conditions. Her EF improved to 40-45% on limited medical therapy but was not able to undergo significant titration of medical therapy given low blood pressures. She was reportedly doing well, with minimal lower extremity edema treated with lasix .      SUBJECTIVE:  Patient comes from [facility]  She is not able to answer simple questions, recent admission for CVA in the setting of known dementia.  Blood pressure has been extremely low, started on midodrine  to keep on Entresto .  Also recently had a UTI.  PMH, current medications, allergies, social history, and family history reviewed in epic.  PHYSICAL EXAM: Vitals:   11/15/24 1418  BP: 110/60  Pulse: 63  SpO2: 97%    GENERAL: NAD, chronically ill appearing PULM:  Normal work of breathing, CTAB CARDIAC:  JVP: flat         Normal rate with regular rhythm. No murmurs, rubs or gallops.  No edema. Warm and well perfused extremities. ABDOMEN: Soft, non-tender, non-distended.    DATA REVIEW  ECG: 04/11/23: Sinus tachycardia, LBBB    ECHO: 04/2023: LVEF 25-30%, severely depressed function, normal RV function, MAC 08/2023: LVEF 40-45%, normal RV, severe MAC with mild mitral stenosis 10/2024: LVEF 50%  CATH: 04/2023: Severe proximal and  distal left main disease, occluded RCA, severe mid LAD disease, elevated LVEDP of 29     ASSESSMENT & PLAN:  Chronic heart failure with mildly reduced EF: Ischemic in origin, now improved to 50% with medical therapy.  Unfortunately her multiple medical risk comorbid conditions including dementia, recent worsening functional status take precedence over aggressive titration of medical therapy. - Stop Farxiga  due to recent UTI -Stop Entresto  given that she is taking midodrine  -Decrease midodrine  to 5 mg 3 times daily -Continue spironolactone  12.5 mg daily -Torsemide  20 mg daily, appears euvolemic - For repeat echocardiograms or further advanced heart failure clinic follow-up.  Will refer to general cardiology given history of CAD/PAD  CAD: No severe disease, no targets for bypass surgery given lack of angina no benefit to PCI. - Continue Plavix  75 mg daily, statin  Peripheral arterial disease: Known carotid disease s/p left ICA stenting, R CEA.  - Unable to assess symptoms today given dementia, recent CVA g  CKD: Stage IIIb - Stopping SGLT2 as above  Dementia: Dementia, recent CVA, UTI.  Severe worsening of functional status. - Would benefit from palliative care and potentially hospice referral No need for further advanced heart failure care Morene Brownie, MD Advanced Heart Failure Mechanical Circulatory Support 11/18/24 "

## 2024-11-15 NOTE — Patient Instructions (Signed)
 CONGRATULATIONS YOU HAVE GRADUATED FROM THE ADVANCED HEART FAILURE CLINIC.  STOP Farxiga   STOP Entresto   CHANGE Midodrine  to 5 mg Three times a day  You have been referred to General Cardiology. They will call you to arrange your appointment.  At the Advanced Heart Failure Clinic, you and your health needs are our priority. As part of our continuing mission to provide you with exceptional heart care, we have created designated Provider Care Teams. These Care Teams include your primary Cardiologist (physician) and Advanced Practice Providers (APPs- Physician Assistants and Nurse Practitioners) who all work together to provide you with the care you need, when you need it.   You may see any of the following providers on your designated Care Team at your next follow up: Dr Toribio Fuel Dr Ezra Shuck Dr. Morene Brownie Greig Mosses, NP Caffie Shed, [place] [facility] [place], [place] Beckey Coe, NP Jordan Lee, NP Ellouise Class, NP Tinnie Redman, PharmD Jaun Bash, PharmD   Please be sure to bring in all your medications bottles to every appointment.    Thank you for choosing [facility]

## 2024-12-12 ENCOUNTER — Ambulatory Visit: Admitting: Vascular Surgery

## 2024-12-12 ENCOUNTER — Ambulatory Visit (HOSPITAL_COMMUNITY)

## 2025-01-24 ENCOUNTER — Ambulatory Visit: Admitting: Internal Medicine
